# Patient Record
Sex: Male | Born: 1960 | Race: White | Hispanic: No | Marital: Married | Smoking: Former smoker
Health system: Southern US, Community
[De-identification: ages and names within clinical notes are randomized; demographics above are authoritative.]

## PROBLEM LIST (undated history)

## (undated) DIAGNOSIS — R519 Headache, unspecified: Secondary | ICD-10-CM

## (undated) DIAGNOSIS — R0600 Dyspnea, unspecified: Secondary | ICD-10-CM

## (undated) DIAGNOSIS — Z955 Presence of coronary angioplasty implant and graft: Secondary | ICD-10-CM

## (undated) DIAGNOSIS — R0602 Shortness of breath: Secondary | ICD-10-CM

## (undated) DIAGNOSIS — I700 Atherosclerosis of aorta: Secondary | ICD-10-CM

## (undated) DIAGNOSIS — K760 Fatty (change of) liver, not elsewhere classified: Secondary | ICD-10-CM

## (undated) DIAGNOSIS — R5383 Other fatigue: Secondary | ICD-10-CM

## (undated) DIAGNOSIS — R7989 Other specified abnormal findings of blood chemistry: Secondary | ICD-10-CM

## (undated) DIAGNOSIS — I10 Essential (primary) hypertension: Secondary | ICD-10-CM

## (undated) DIAGNOSIS — M1990 Unspecified osteoarthritis, unspecified site: Secondary | ICD-10-CM

## (undated) DIAGNOSIS — E785 Hyperlipidemia, unspecified: Secondary | ICD-10-CM

## (undated) DIAGNOSIS — T7840XA Allergy, unspecified, initial encounter: Secondary | ICD-10-CM

## (undated) DIAGNOSIS — M2550 Pain in unspecified joint: Secondary | ICD-10-CM

## (undated) DIAGNOSIS — J84112 Idiopathic pulmonary fibrosis: Secondary | ICD-10-CM

## (undated) DIAGNOSIS — G709 Myoneural disorder, unspecified: Secondary | ICD-10-CM

## (undated) DIAGNOSIS — K5900 Constipation, unspecified: Secondary | ICD-10-CM

## (undated) DIAGNOSIS — R1310 Dysphagia, unspecified: Secondary | ICD-10-CM

## (undated) DIAGNOSIS — K219 Gastro-esophageal reflux disease without esophagitis: Secondary | ICD-10-CM

## (undated) DIAGNOSIS — K0889 Other specified disorders of teeth and supporting structures: Secondary | ICD-10-CM

## (undated) DIAGNOSIS — D126 Benign neoplasm of colon, unspecified: Secondary | ICD-10-CM

## (undated) DIAGNOSIS — I2510 Atherosclerotic heart disease of native coronary artery without angina pectoris: Secondary | ICD-10-CM

## (undated) DIAGNOSIS — Z9289 Personal history of other medical treatment: Secondary | ICD-10-CM

## (undated) DIAGNOSIS — R0609 Other forms of dyspnea: Secondary | ICD-10-CM

## (undated) DIAGNOSIS — R51 Headache: Secondary | ICD-10-CM

## (undated) DIAGNOSIS — F32A Depression, unspecified: Secondary | ICD-10-CM

## (undated) DIAGNOSIS — J45909 Unspecified asthma, uncomplicated: Secondary | ICD-10-CM

## (undated) DIAGNOSIS — R21 Rash and other nonspecific skin eruption: Secondary | ICD-10-CM

## (undated) DIAGNOSIS — R778 Other specified abnormalities of plasma proteins: Secondary | ICD-10-CM

## (undated) HISTORY — DX: Personal history of other medical treatment: Z92.89

## (undated) HISTORY — DX: Atherosclerotic heart disease of native coronary artery without angina pectoris: I25.10

## (undated) HISTORY — DX: Other specified abnormalities of plasma proteins: R77.8

## (undated) HISTORY — DX: Hyperlipidemia, unspecified: E78.5

## (undated) HISTORY — DX: Other forms of dyspnea: R06.09

## (undated) HISTORY — DX: Fatty (change of) liver, not elsewhere classified: K76.0

## (undated) HISTORY — DX: Benign neoplasm of colon, unspecified: D12.6

## (undated) HISTORY — DX: Dyspnea, unspecified: R06.00

## (undated) HISTORY — DX: Shortness of breath: R06.02

## (undated) HISTORY — DX: Dysphagia, unspecified: R13.10

## (undated) HISTORY — DX: Depression, unspecified: F32.A

## (undated) HISTORY — DX: Other specified disorders of teeth and supporting structures: K08.89

## (undated) HISTORY — DX: Allergy, unspecified, initial encounter: T78.40XA

## (undated) HISTORY — PX: OTHER SURGICAL HISTORY: SHX169

## (undated) HISTORY — PX: COLONOSCOPY: SHX174

## (undated) HISTORY — DX: Pain in unspecified joint: M25.50

## (undated) HISTORY — DX: Other specified abnormal findings of blood chemistry: R79.89

## (undated) HISTORY — DX: Essential (primary) hypertension: I10

## (undated) HISTORY — DX: Rash and other nonspecific skin eruption: R21

## (undated) HISTORY — DX: Other fatigue: R53.83

## (undated) HISTORY — DX: Gastro-esophageal reflux disease without esophagitis: K21.9

## (undated) HISTORY — DX: Presence of coronary angioplasty implant and graft: Z95.5

## (undated) HISTORY — DX: Constipation, unspecified: K59.00

---

## 1997-09-14 ENCOUNTER — Encounter: Admission: RE | Admit: 1997-09-14 | Discharge: 1997-12-13 | Payer: Self-pay | Admitting: Family Medicine

## 2000-12-16 ENCOUNTER — Other Ambulatory Visit: Admission: RE | Admit: 2000-12-16 | Discharge: 2000-12-16 | Payer: Self-pay | Admitting: Gastroenterology

## 2012-03-27 ENCOUNTER — Other Ambulatory Visit (HOSPITAL_COMMUNITY): Payer: Self-pay | Admitting: Family Medicine

## 2012-03-27 DIAGNOSIS — R748 Abnormal levels of other serum enzymes: Secondary | ICD-10-CM

## 2012-04-08 HISTORY — PX: COLONOSCOPY W/ BIOPSIES AND POLYPECTOMY: SHX1376

## 2012-04-21 ENCOUNTER — Other Ambulatory Visit (HOSPITAL_COMMUNITY): Payer: Self-pay

## 2012-04-24 ENCOUNTER — Other Ambulatory Visit (HOSPITAL_COMMUNITY): Payer: Self-pay | Admitting: Family Medicine

## 2012-04-24 ENCOUNTER — Ambulatory Visit (HOSPITAL_COMMUNITY)
Admission: RE | Admit: 2012-04-24 | Discharge: 2012-04-24 | Disposition: A | Discharge status: Home / Self Care | Payer: 59 | Source: Ambulatory Visit | Attending: Family Medicine | Admitting: Family Medicine

## 2012-04-24 DIAGNOSIS — R748 Abnormal levels of other serum enzymes: Secondary | ICD-10-CM

## 2012-04-24 DIAGNOSIS — K8010 Calculus of gallbladder with chronic cholecystitis without obstruction: Secondary | ICD-10-CM | POA: Insufficient documentation

## 2012-04-24 DIAGNOSIS — R160 Hepatomegaly, not elsewhere classified: Secondary | ICD-10-CM | POA: Insufficient documentation

## 2012-08-28 ENCOUNTER — Encounter: Payer: Self-pay | Admitting: Gastroenterology

## 2012-09-03 ENCOUNTER — Telehealth: Payer: Self-pay

## 2012-09-03 NOTE — Telephone Encounter (Signed)
Pt was referred by Ferdie Ping, PA for a screening colonoscopy. LM at home for a return call.

## 2012-09-04 ENCOUNTER — Encounter: Payer: Self-pay | Admitting: Gastroenterology

## 2012-09-08 NOTE — Telephone Encounter (Signed)
LM for a return call.  

## 2012-09-22 NOTE — Telephone Encounter (Signed)
Letter to pt and PCP.  

## 2013-01-27 ENCOUNTER — Encounter: Payer: Self-pay | Admitting: Gastroenterology

## 2013-03-08 DIAGNOSIS — D126 Benign neoplasm of colon, unspecified: Secondary | ICD-10-CM

## 2013-03-08 HISTORY — DX: Benign neoplasm of colon, unspecified: D12.6

## 2013-03-18 ENCOUNTER — Ambulatory Visit (AMBULATORY_SURGERY_CENTER): Payer: 59 | Admitting: *Deleted

## 2013-03-18 VITALS — Ht 70.0 in | Wt 276.0 lb

## 2013-03-18 DIAGNOSIS — Z1211 Encounter for screening for malignant neoplasm of colon: Secondary | ICD-10-CM

## 2013-03-18 MED ORDER — MOVIPREP 100 G PO SOLR: ORAL | Status: DC

## 2013-03-18 NOTE — Progress Notes (Signed)
No allergies to eggs or soy. No prior anesthesia.  

## 2013-03-23 ENCOUNTER — Encounter: Payer: Self-pay | Admitting: Gastroenterology

## 2013-03-24 ENCOUNTER — Encounter: Payer: Self-pay | Admitting: Gastroenterology

## 2013-03-30 ENCOUNTER — Encounter: Payer: Self-pay | Admitting: Gastroenterology

## 2013-03-30 ENCOUNTER — Ambulatory Visit (AMBULATORY_SURGERY_CENTER): Payer: 59 | Admitting: Gastroenterology

## 2013-03-30 VITALS — BP 144/91 | HR 68 | Temp 97.6°F | Resp 15 | Ht 70.0 in | Wt 276.0 lb

## 2013-03-30 DIAGNOSIS — D126 Benign neoplasm of colon, unspecified: Secondary | ICD-10-CM

## 2013-03-30 DIAGNOSIS — Z1211 Encounter for screening for malignant neoplasm of colon: Secondary | ICD-10-CM

## 2013-03-30 MED ORDER — SODIUM CHLORIDE 0.9 % IV SOLN: 500.0000 mL | INTRAVENOUS | Status: DC

## 2013-03-30 NOTE — Progress Notes (Signed)
Called to room to assist during endoscopic procedure.  Patient ID and intended procedure confirmed with present staff. Received instructions for my participation in the procedure from the performing physician.  

## 2013-03-30 NOTE — Progress Notes (Signed)
Lidocaine-40mg IV prior to Propofol InductionPropofol given over incremental dosages 

## 2013-03-30 NOTE — Patient Instructions (Signed)
YOU HAD AN ENDOSCOPIC PROCEDURE TODAY AT THE [facility]: Refer to the procedure report that was given to you for any specific questions about what was found during the examination.  If the procedure report does not answer your questions, please call your gastroenterologist to clarify.  If you requested that your care partner not be given the details of your procedure findings, then the procedure report has been included in a sealed envelope for you to review at your convenience later.  YOU SHOULD EXPECT: Some feelings of bloating in the abdomen. Passage of more gas than usual.  Walking can help get rid of the air that was put into your GI tract during the procedure and reduce the bloating. If you had a lower endoscopy (such as a colonoscopy or flexible sigmoidoscopy) you may notice spotting of blood in your stool or on the toilet paper. If you underwent a bowel prep for your procedure, then you may not have a normal bowel movement for a few days.  DIET: Your first meal following the procedure should be a light meal and then it is ok to progress to your normal diet.  A half-sandwich or bowl of soup is an example of a good first meal.  Heavy or fried foods are harder to digest and may make you feel nauseous or bloated.  Likewise meals heavy in dairy and vegetables can cause extra gas to form and this can also increase the bloating.  Drink plenty of fluids but you should avoid alcoholic beverages for 24 hours.  ACTIVITY: Your care partner should take you home directly after the procedure.  You should plan to take it easy, moving slowly for the rest of the day.  You can resume normal activity the day after the procedure however you should NOT DRIVE or use heavy machinery for 24 hours (because of the sedation medicines used during the test).    SYMPTOMS TO REPORT IMMEDIATELY: A gastroenterologist can be reached at any hour.  During normal business hours, 8:30 AM to 5:00 PM Monday through Friday,  call [phone].  After hours and on weekends, please call the GI answering service at [phone] who will take a message and have the physician on call contact you.   Following lower endoscopy (colonoscopy or flexible sigmoidoscopy):  Excessive amounts of blood in the stool  Significant tenderness or worsening of abdominal pains  Swelling of the abdomen that is new, acute  Fever of 100F or higher    FOLLOW UP: If any biopsies were taken you will be contacted by phone or by letter within the next 1-3 weeks.  Call your gastroenterologist if you have not heard about the biopsies in 3 weeks.  Our staff will call the home number listed on your records the next business day following your procedure to check on you and address any questions or concerns that you may have at that time regarding the information given to you following your procedure. This is a courtesy call and so if there is no answer at the home number and we have not heard from you through the emergency physician on call, we will assume that you have returned to your regular daily activities without incident.  SIGNATURES/CONFIDENTIALITY: You and/or your care partner have signed paperwork which will be entered into your electronic medical record.  These signatures attest to the fact that that the information above on your After Visit Summary has been reviewed and is understood.  Full responsibility of the confidentiality   of this discharge information lies with you and/or your care-partner.   Polyp, diverticulosis, and high fiber information given.  Dr. Russella Dar will let you know about timing for next colonoscopy after he reviews pathology reports.

## 2013-03-30 NOTE — Op Note (Signed)
[facility] [address] [place], [address]   COLONOSCOPY PROCEDURE REPORT PATIENT: Colin Lee, Colin Lee  MR#: [id number] BIRTHDATE: 1961-03-01 , 52  yrs. old GENDER: Male ENDOSCOPIST: Meryl Dare, MD, [facility] REFERRED QI:ONGEXBM Robertsobn, PA-C PROCEDURE DATE:  03/30/2013 PROCEDURE:   Colonoscopy with biopsy and snare polypectomy First Screening Colonoscopy - Avg.  risk and is 50 yrs.  old or older Yes.  Prior Negative Screening - Now for repeat screening. N/A  History of Adenoma - Now for follow-up colonoscopy & has been > or = to 3 yrs.  N/A  Polyps Removed Today? Yes. ASA CLASS:   Class II INDICATIONS:average risk screening. MEDICATIONS: MAC sedation, administered by CRNA and propofol (Diprivan) 250mg  IV DESCRIPTION OF PROCEDURE:   After the risks benefits and alternatives of the procedure were thoroughly explained, informed consent was obtained.  A digital rectal exam revealed no abnormalities of the rectum.   The LB [id number]  endoscope was introduced through the anus and advanced to the cecum, which was identified by both the appendix and ileocecal valve. No adverse events experienced.   The quality of the prep was good, using MoviPrep  The instrument was then slowly withdrawn as the colon was fully examined.  COLON FINDINGS: A sessile polyp measuring 6 mm in size was found in the transverse colon.  A polypectomy was performed with a cold snare.  The resection was complete and the polyp tissue was completely retrieved.   Two sessile polyps measuring 6-7 mm in size were found in the sigmoid colon.  A polypectomy was performed with a cold snare.  The resection was complete and the polyp tissue was completely retrieved.   Two sessile polyps measuring 3-4 mm in size were found in the sigmoid colon.  A polypectomy was performed with cold forceps.  The resection was complete and the polyp tissue was completely retrieved.   Mild diverticulosis was  noted in the transverse colon.   The colon was otherwise normal.  There was no diverticulosis, inflammation, polyps or cancers unless previously stated.  Retroflexed views revealed no abnormalities. The time to cecum=1 minutes 15 seconds.  Withdrawal time=12 minutes 33 seconds. The scope was withdrawn and the procedure completed. COMPLICATIONS: There were no complications. ENDOSCOPIC IMPRESSION: 1.   Sessile polyp measuring 6 mm in the transverse colon; polypectomy performed with a cold snare 2.   Two sessile polyps measuring 6-7 mm in the sigmoid colon; polypectomy performed with a cold snare 3.   Two sessile polyps measuring 3-4 mm in the sigmoid colon; polypectomy performed with cold forceps 4.   Mild diverticulosis was noted in the transverse colon  RECOMMENDATIONS: 1.  Await pathology results 2.  Repeat colonoscopy in 3 years if 3 or more polyps adenomatous; 5 years if 1-2 adenomatous: otherwise 10 years  eSigned:  Meryl Dare, MD, [facility] 03/30/2013 9:27 AM

## 2013-03-31 ENCOUNTER — Telehealth: Payer: Self-pay | Admitting: *Deleted

## 2013-03-31 NOTE — Telephone Encounter (Signed)
  Follow up Call-  Call back number 03/30/2013  Post procedure Call Back phone  # [phone]  Permission to leave phone message Yes     Patient questions:  Do you have a fever, pain , or abdominal swelling? no Pain Score  0 *  Have you tolerated food without any problems? yes  Have you been able to return to your normal activities? yes  Do you have any questions about your discharge instructions: Diet   no Medications  no Follow up visit  no  Do you have questions or concerns about your Care? no  Actions: * If pain score is 4 or above: No action needed, pain <4.

## 2013-04-08 ENCOUNTER — Encounter: Payer: Self-pay | Admitting: Gastroenterology

## 2013-04-08 HISTORY — PX: THORACENTESIS: SHX235

## 2013-10-06 ENCOUNTER — Emergency Department (HOSPITAL_COMMUNITY): Payer: BC Managed Care – PPO

## 2013-10-06 ENCOUNTER — Inpatient Hospital Stay (HOSPITAL_COMMUNITY): Payer: BC Managed Care – PPO

## 2013-10-06 ENCOUNTER — Inpatient Hospital Stay (HOSPITAL_COMMUNITY)
Admission: EM | Admit: 2013-10-06 | Discharge: 2013-10-11 | DRG: 186 | Disposition: A | Discharge status: Home / Self Care | Payer: BC Managed Care – PPO | Attending: Internal Medicine | Admitting: Internal Medicine

## 2013-10-06 ENCOUNTER — Encounter (HOSPITAL_COMMUNITY): Payer: Self-pay | Admitting: Emergency Medicine

## 2013-10-06 DIAGNOSIS — K219 Gastro-esophageal reflux disease without esophagitis: Secondary | ICD-10-CM | POA: Diagnosis present

## 2013-10-06 DIAGNOSIS — J90 Pleural effusion, not elsewhere classified: Principal | ICD-10-CM | POA: Diagnosis present

## 2013-10-06 DIAGNOSIS — G2581 Restless legs syndrome: Secondary | ICD-10-CM

## 2013-10-06 DIAGNOSIS — Z23 Encounter for immunization: Secondary | ICD-10-CM

## 2013-10-06 DIAGNOSIS — J189 Pneumonia, unspecified organism: Secondary | ICD-10-CM

## 2013-10-06 DIAGNOSIS — M129 Arthropathy, unspecified: Secondary | ICD-10-CM | POA: Diagnosis present

## 2013-10-06 DIAGNOSIS — D721 Eosinophilia, unspecified: Secondary | ICD-10-CM | POA: Diagnosis present

## 2013-10-06 DIAGNOSIS — R0902 Hypoxemia: Secondary | ICD-10-CM | POA: Diagnosis present

## 2013-10-06 DIAGNOSIS — Z87891 Personal history of nicotine dependence: Secondary | ICD-10-CM

## 2013-10-06 DIAGNOSIS — I10 Essential (primary) hypertension: Secondary | ICD-10-CM | POA: Diagnosis present

## 2013-10-06 HISTORY — DX: Shortness of breath: R06.02

## 2013-10-06 LAB — CBC
HCT: 41.1 % (ref 39.0–52.0)
Hemoglobin: 13.9 g/dL (ref 13.0–17.0)
MCH: 28.3 pg (ref 26.0–34.0)
MCHC: 33.8 g/dL (ref 30.0–36.0)
MCV: 83.7 fL (ref 78.0–100.0)
Platelets: 281 10*3/uL (ref 150–400)
RBC: 4.91 MIL/uL (ref 4.22–5.81)
RDW: 13.2 % (ref 11.5–15.5)
WBC: 9.8 10*3/uL (ref 4.0–10.5)

## 2013-10-06 LAB — CREATININE, SERUM
Creatinine, Ser: 0.88 mg/dL (ref 0.50–1.35)
GFR calc Af Amer: >90 mL/min (ref >90)
GFR calc non Af Amer: >90 mL/min (ref >90)

## 2013-10-06 LAB — CBC WITH DIFFERENTIAL/PLATELET
Basophils Absolute: 0 10*3/uL (ref 0.0–0.1)
Basophils Relative: 0 % (ref 0–1)
Eosinophils Absolute: 0.7 10*3/uL (ref 0.0–0.7)
Eosinophils Relative: 7 % — ABNORMAL HIGH (ref 0–5)
HCT: 40.8 % (ref 39.0–52.0)
Hemoglobin: 14 g/dL (ref 13.0–17.0)
Lymphocytes Relative: 24 % (ref 12–46)
Lymphs Abs: 2.3 10*3/uL (ref 0.7–4.0)
MCH: 28.6 pg (ref 26.0–34.0)
MCHC: 34.3 g/dL (ref 30.0–36.0)
MCV: 83.3 fL (ref 78.0–100.0)
Monocytes Absolute: 0.6 10*3/uL (ref 0.1–1.0)
Monocytes Relative: 7 % (ref 3–12)
Neutro Abs: 6 10*3/uL (ref 1.7–7.7)
Neutrophils Relative %: 62 % (ref 43–77)
Platelets: 278 10*3/uL (ref 150–400)
RBC: 4.9 MIL/uL (ref 4.22–5.81)
RDW: 13.2 % (ref 11.5–15.5)
WBC: 9.6 10*3/uL (ref 4.0–10.5)

## 2013-10-06 LAB — COMPREHENSIVE METABOLIC PANEL
ALT: 53 U/L (ref 0–53)
AST: 36 U/L (ref 0–37)
Albumin: 3.7 g/dL (ref 3.5–5.2)
Alkaline Phosphatase: 82 U/L (ref 39–117)
Anion gap: 13 (ref 5–15)
BUN: 16 mg/dL (ref 6–23)
CO2: 28 mEq/L (ref 19–32)
Calcium: 9.3 mg/dL (ref 8.4–10.5)
Chloride: 101 mEq/L (ref 96–112)
Creatinine, Ser: 0.98 mg/dL (ref 0.50–1.35)
GFR calc Af Amer: >90 mL/min (ref >90)
GFR calc non Af Amer: >90 mL/min (ref >90)
Glucose, Bld: 103 mg/dL — ABNORMAL HIGH (ref 70–99)
Potassium: 4.3 mEq/L (ref 3.7–5.3)
Sodium: 142 mEq/L (ref 137–147)
Total Bilirubin: 0.4 mg/dL (ref 0.3–1.2)
Total Protein: 7.2 g/dL (ref 6.0–8.3)

## 2013-10-06 LAB — PRO B NATRIURETIC PEPTIDE: Pro B Natriuretic peptide (BNP): 79.8 pg/mL (ref 0–125)

## 2013-10-06 LAB — TROPONIN I: Troponin I: <0.3 ng/mL (ref <0.30)

## 2013-10-06 MED ORDER — PANTOPRAZOLE SODIUM 40 MG PO TBEC
40.0000 mg | DELAYED_RELEASE_TABLET | Freq: Every day | ORAL | Status: DC
  Administered 2013-10-07 – 2013-10-11 (×5): 40 mg via ORAL
  Filled 2013-10-06 (×5): qty 1

## 2013-10-06 MED ORDER — LOSARTAN POTASSIUM 50 MG PO TABS
50.0000 mg | ORAL_TABLET | Freq: Every day | ORAL | Status: DC
  Administered 2013-10-07 – 2013-10-11 (×5): 50 mg via ORAL
  Filled 2013-10-06 (×5): qty 1

## 2013-10-06 MED ORDER — ROPINIROLE HCL 0.25 MG PO TABS
0.2500 mg | ORAL_TABLET | Freq: Every day | ORAL | Status: DC
  Administered 2013-10-06 – 2013-10-10 (×5): 0.25 mg via ORAL
  Filled 2013-10-06 (×7): qty 1

## 2013-10-06 MED ORDER — ACETAMINOPHEN 325 MG PO TABS
650.0000 mg | ORAL_TABLET | Freq: Four times a day (QID) | ORAL | Status: DC | PRN
  Administered 2013-10-07 – 2013-10-09 (×6): 650 mg via ORAL
  Filled 2013-10-06 (×6): qty 2

## 2013-10-06 MED ORDER — ONDANSETRON HCL 4 MG/2ML IJ SOLN: 4.0000 mg | Freq: Four times a day (QID) | INTRAMUSCULAR | Status: DC | PRN

## 2013-10-06 MED ORDER — SODIUM CHLORIDE 0.9 % IV SOLN
INTRAVENOUS | Status: DC
  Administered 2013-10-06: 23:00:00 via INTRAVENOUS

## 2013-10-06 MED ORDER — FOLIC ACID 1 MG PO TABS
1.0000 mg | ORAL_TABLET | Freq: Every day | ORAL | Status: DC
  Administered 2013-10-07 – 2013-10-11 (×5): 1 mg via ORAL
  Filled 2013-10-06 (×5): qty 1

## 2013-10-06 MED ORDER — OXYCODONE HCL 5 MG PO TABS
5.0000 mg | ORAL_TABLET | ORAL | Status: DC | PRN
  Administered 2013-10-08 (×2): 5 mg via ORAL
  Filled 2013-10-06 (×2): qty 1

## 2013-10-06 MED ORDER — ONDANSETRON HCL 4 MG PO TABS: 4.0000 mg | ORAL_TABLET | Freq: Four times a day (QID) | ORAL | Status: DC | PRN

## 2013-10-06 MED ORDER — PNEUMOCOCCAL VAC POLYVALENT 25 MCG/0.5ML IJ INJ
0.5000 mL | INJECTION | INTRAMUSCULAR | Status: AC
  Administered 2013-10-07: 0.5 mL via INTRAMUSCULAR
  Filled 2013-10-06: qty 0.5

## 2013-10-06 MED ORDER — ADULT MULTIVITAMIN W/MINERALS CH
1.0000 | ORAL_TABLET | Freq: Every day | ORAL | Status: DC
  Administered 2013-10-07 – 2013-10-11 (×5): 1 via ORAL
  Filled 2013-10-06 (×5): qty 1

## 2013-10-06 MED ORDER — VITAMIN B-1 100 MG PO TABS
100.0000 mg | ORAL_TABLET | Freq: Every day | ORAL | Status: DC
  Administered 2013-10-07 – 2013-10-11 (×5): 100 mg via ORAL
  Filled 2013-10-06 (×5): qty 1

## 2013-10-06 MED ORDER — CEFTRIAXONE SODIUM 1 G IJ SOLR
INTRAMUSCULAR | Status: AC
  Filled 2013-10-06: qty 10

## 2013-10-06 MED ORDER — HEPARIN SODIUM (PORCINE) 5000 UNIT/ML IJ SOLN
5000.0000 [IU] | Freq: Three times a day (TID) | INTRAMUSCULAR | Status: DC
  Administered 2013-10-06 – 2013-10-11 (×14): 5000 [IU] via SUBCUTANEOUS
  Filled 2013-10-06 (×14): qty 1

## 2013-10-06 MED ORDER — CEFTRIAXONE SODIUM 1 G IJ SOLR
1.0000 g | INTRAMUSCULAR | Status: DC
  Administered 2013-10-06 – 2013-10-10 (×5): 1 g via INTRAVENOUS
  Filled 2013-10-06 (×6): qty 10

## 2013-10-06 MED ORDER — IOHEXOL 350 MG/ML SOLN
100.0000 mL | Freq: Once | INTRAVENOUS | Status: AC | PRN
  Administered 2013-10-06: 100 mL via INTRAVENOUS

## 2013-10-06 MED ORDER — ZOLPIDEM TARTRATE 5 MG PO TABS: 5.0000 mg | ORAL_TABLET | Freq: Every evening | ORAL | Status: DC | PRN

## 2013-10-06 MED ORDER — ALUM & MAG HYDROXIDE-SIMETH 200-200-20 MG/5ML PO SUSP: 30.0000 mL | Freq: Four times a day (QID) | ORAL | Status: DC | PRN

## 2013-10-06 MED ORDER — ASPIRIN 81 MG PO CHEW
324.0000 mg | CHEWABLE_TABLET | Freq: Once | ORAL | Status: AC
  Administered 2013-10-06: 324 mg via ORAL
  Filled 2013-10-06: qty 4

## 2013-10-06 MED ORDER — ACETAMINOPHEN 650 MG RE SUPP: 650.0000 mg | Freq: Four times a day (QID) | RECTAL | Status: DC | PRN

## 2013-10-06 NOTE — ED Notes (Signed)
Pt ambulated once around nursing station. HR began at 100bpm and increased to 114bpm while walking. Pulse Ox began at 96% and pt desat to 88-89% on room air. Hospitalist in room with pt and made aware.

## 2013-10-06 NOTE — ED Provider Notes (Addendum)
This chart was scribed for [name], DO by Lowella Petties, ED Scribe. The patient was seen in room APA12/APA12. Patient's care was started at 7:07 PM.  CHIEF COMPLAINT: Pneumonia  HPI Comments: Colin Lee is a 53 y.o. male with a history of HTN who presents to the Emergency Department complaining of SOB and chest tightness intermittently since May. He states his pain is worse with exertion.Marland Kitchen He states that last month he was given a prednisone and azithromycin with relief, but his symptoms will quickly return. He states that his pain is exacerbated by walking and exertion.   He reports an associated dry productive cough and chest pressure without radiation. He reports fever and chills last month, last fever June 20. He denies DM or high cholesterol. He denies history of DVT or PE, cancer, recent prolonged immobilization or fracture or surgery or trauma. He denies family history of heart problems. He was a smoker, but quit in 2002.  He was seen at [facility] today and had a chest x-ray which showed a small-to-moderate pleural effusion and he was sent to the emergency department for further evaluation.  ROS: See HPI Constitutional: no fever  Eyes: no drainage  ENT: no runny nose   Cardiovascular:  chest pain, chest tightness Resp: SOB  GI: no vomiting GU: no dysuria Integumentary: no rash  Allergy: no hives  Musculoskeletal: no leg swelling  Neurological: no slurred speech ROS otherwise negative  PAST MEDICAL HISTORY/PAST SURGICAL HISTORY:  Past Medical History  Diagnosis Date  . GERD (gastroesophageal reflux disease)   . Hypertension     MEDICATIONS:  Prior to Admission medications   Medication Sig Start Date End Date Taking? Authorizing Provider  aspirin 81 MG tablet Take 81 mg by mouth daily.    Historical Provider, MD  ibuprofen (ADVIL,MOTRIN) 600 MG tablet Take 600 mg by mouth every 6 (six) hours as needed.    Historical Provider, MD  losartan (COZAAR) 50  MG tablet Take 50 mg by mouth daily.    Historical Provider, MD  omeprazole (PRILOSEC) 20 MG capsule Take 20 mg by mouth daily.    Historical Provider, MD    ALLERGIES:  No Known Allergies  SOCIAL HISTORY:  History  Substance Use Topics  . Smoking status: Former Smoker -- 1.00 packs/day for 30 years    Types: Cigarettes    Quit date: 04/08/2000  . Smokeless tobacco: Never Used  . Alcohol Use: Yes     Comment: rare    FAMILY HISTORY: Family History  Problem Relation Age of Onset  . Adopted: Yes  . Family history unknown: Yes    EXAM: Triage Vitals: BP 151/93  Pulse 99  Temp(Src) 97.9 F (36.6 C) (Oral)  Resp 20  Ht 5\' 10"  (1.778 m)  Wt 280 lb (127.007 kg)  BMI 40.18 kg/m2  SpO2 95% CONSTITUTIONAL: Alert and oriented and responds appropriately to questions. Well-appearing; well-nourished in no apparent distress HEAD: Normocephalic EYES: Conjunctivae clear, PERRL ENT: normal nose; no rhinorrhea; moist mucous membranes; pharynx without lesions noted NECK: Supple, no meningismus, no LAD  CARD: RRR; S1 and S2 appreciated; no murmurs, no clicks, no rubs, no gallops RESP: Normal chest excursion without splinting or tachypnea; patient has diminished breath sounds to the mid lung on the left side, no wheezing or rhonchi, no respiratory distress or hypoxia, no increased work of breathing ABD/GI: Normal bowel sounds; non-distended; soft, non-tender, no rebound, no guarding BACK:  The back appears normal and is non-tender  to palpation, there is no CVA tenderness EXT: Normal ROM in all joints; non-tender to palpation; no edema; normal capillary refill; no cyanosis    SKIN: Normal color for age and race; warm NEURO: Moves all extremities equally PSYCH: The patient's mood and manner are appropriate. Grooming and personal hygiene are appropriate.  MEDICAL DECISION MAKING: Patient here with small to moderate left-sided pleural effusion seen on chest x-ray. He describes shortness of  breath is worse with exertion as well as chest pressure. No chest pressure currently. We'll obtain cardiac labs, BMP and repeat chest x-ray. He has no hypoxia, respiratory distress or increased work of breathing currently.  ED PROGRESS: Pt's cardiac labs are unremarkable. Chest x-ray shows a moderate pleural effusion. Given he becomes symptomatic with ambulation, will admit to hospitalist for possible thoracentesis. Discussed with Dr. Humphrey Rolls. Patient desats to 88% with ambulation.    EKG Interpretation  Date/Time:  Wednesday October 06 2013 18:42:18 EDT Ventricular Rate:  97 PR Interval:  138 QRS Duration: 97 QT Interval:  353 QTC Calculation: 448 R Axis:   37 Text Interpretation:  Sinus rhythm Confirmed by WARD,  DO, KRISTEN ([id number]) on 10/06/2013 7:02:52 PM        I personally performed the services described in this documentation, which was scribed in my presence. The recorded information has been reviewed and is accurate.     [place], DO 10/06/13 2057  Irene, DO 10/06/13 2100

## 2013-10-06 NOTE — H&P (Signed)
[facility] History and Physical  Colin Lee Colin Lee DOB: Apr 01, 1961 DOA: 10/06/2013  Referring physician: Pryor Curia, DO PCP: Bronson Curb, PA-C   Chief Complaint: Chest pain  HPI: Colin Lee is a 53 y.o. male who has been having pain in his chest since about May. He states he has had some cough was seen in the PCP office and was given a zpack. Patient states that he had also had some steroids. Patient states that he has noted some shortness of breath. He states that the pain is related to his breathing. It does not appear to be radiating anywhere. Patient states that he has no nausea and no vomiting, denies any fevers presently. 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 Patient in the ED was noted to drop his saturation to 89% with ambulation. In addition he states that he does have some chronic arthritis and takes Ibuprofen for this as well as RLS. On evaluation of the CXR he does show a pleural effusion on the left side.   Review of Systems:  Constitutional:  No weight loss, night sweats, Fevers, chills, fatigue.  HEENT:  No headaches Cardio-vascular:  ++chest pain, no Orthopnea, PND, swelling in lower extremities, anasarca  GI:  No heartburn, indigestion, abdominal pain, nausea, vomiting, diarrhea  Resp:  ++shortness of breath with exertion. No coughing up of blood Skin:  no rash or lesions.  GU:  no dysuria, change in color of urine  Musculoskeletal:  ++joint pain or swelling. No decreased range of motion Psych:  No change in mood or affect. No depression or anxiety. No memory loss.   Past Medical History  Diagnosis Date  . GERD (gastroesophageal reflux disease)   . Hypertension    Past Surgical History  Procedure Laterality Date  . No prior surgery     Social History:  reports that he quit smoking about 13 years ago. His smoking use included Cigarettes. He has a 30  pack-year smoking history. He has never used smokeless tobacco. He reports that he drinks alcohol. He reports that he does not use illicit drugs.  No Known Allergies  Family History  Problem Relation Age of Onset  . Adopted: Yes  . Family history unknown: Yes     Prior to Admission medications   Medication Sig Start Date End Date Taking? Authorizing Provider  ibuprofen (ADVIL,MOTRIN) 600 MG tablet Take 600 mg by mouth every 6 (six) hours as needed. pain   Yes Historical Provider, MD  losartan (COZAAR) 50 MG tablet Take 50 mg by mouth daily.   Yes Historical Provider, MD  omeprazole (PRILOSEC) 20 MG capsule Take 20 mg by mouth daily.   Yes Historical Provider, MD   Physical Exam: Filed Vitals:   10/06/13 1832  BP: 151/93  Pulse: 99  Temp: 97.9 F (36.6 C)  Resp: 20    BP 151/93  Pulse 99  Temp(Src) 97.9 F (36.6 C) (Oral)  Resp 20  Ht '5\' 10"'  (1.778 m)  Wt 127.007 kg (280 lb)  BMI 40.18 kg/m2  SpO2 95%  General:  Appears calm and comfortable Eyes: PERRL, normal lids, irises & conjunctiva ENT: grossly normal hearing, lips & tongue Neck: no LAD, masses or thyromegaly Cardiovascular: RRR, no m/r/g. No LE edema Respiratory: CTA bilaterally, no w/r/r. Normal respiratory effort. Skin: no rash or induration seen on limited exam Musculoskeletal: grossly normal tone BUE/BLE Psychiatric: grossly normal mood and affect, speech  fluent and appropriate Neurologic: grossly non-focal.          Labs on Admission:  Basic Metabolic Panel:  Recent Labs Lab 10/06/13 1917  NA 142  K 4.3  CL 101  CO2 28  GLUCOSE 103*  BUN 16  CREATININE 0.98  CALCIUM 9.3   Liver Function Tests:  Recent Labs Lab 10/06/13 1917  AST 36  ALT 53  ALKPHOS 82  BILITOT 0.4  PROT 7.2  ALBUMIN 3.7   No results found for this basename: LIPASE, AMYLASE,  in the last 168 hours No results found for this basename: AMMONIA,  in the last 168 hours CBC:  Recent Labs Lab 10/06/13 1917  WBC 9.6    NEUTROABS 6.0  HGB 14.0  HCT 40.8  MCV 83.3  PLT 278   Cardiac Enzymes:  Recent Labs Lab 10/06/13 1942  TROPONINI <0.30    BNP (last 3 results)  Recent Labs  10/06/13 1942  PROBNP 79.8   CBG: No results found for this basename: GLUCAP,  in the last 168 hours  Radiological Exams on Admission: Dg Chest 2 View  10/06/2013   CLINICAL DATA:  Chest pain/soreness  EXAM: CHEST  2 VIEW  COMPARISON:  None.  FINDINGS: Layering small to moderate left pleural effusion. Associated left lower lobe opacity, likely atelectasis. No focal consolidation. No pneumothorax.  The heart is top-normal in size.  Mild degenerative changes of the visualized thoracolumbar spine.  IMPRESSION: Layering small to moderate left pleural effusion.   Electronically Signed   By: Julian Hy M.D.   On: 10/06/2013 20:35     Assessment/Plan Principal Problem:   Pleural effusion Active Problems:   Hypertension   1. Pleural Effusion -appears to be chronic. In addition he has had possible pneumonia -also has history of arthritis and therefore would get ANA ESR RF and also ANCA -will schedule for thoracentesis but he has been on ibuprofen and received Aspirin in the ED -will get a CT of the chest to make sure there are no loculations  2. Hypertension -will continue with home medications  3. Restless Legs -will start on requip  4. GERD -continue with PPI   Code Status: Full Code (must indicate code status--if unknown or must be presumed, indicate so) Family Communication: Wife (indicate person spoken with, if applicable, with phone number if by telephone) Disposition Plan: Home (indicate anticipated LOS)  Time spent: 70mn  Okie Jansson A [facility] Pager [phone] **Disclaimer: This note may have been dictated with voice recognition software. Similar sounding words can inadvertently be transcribed and this note may contain transcription errors which may not have been corrected upon  publication of note.**

## 2013-10-06 NOTE — ED Notes (Signed)
Patient sent here by [facility]. Patient has had shortness of breath since May. Patient was given antibiotics and prednisone in which he reported getting better but now states shortness of breath has returned and he went back to [facility]. Patient had chest x-ray which showed moderate left pleural effusion and underlying mild pulmonary edema.

## 2013-10-07 ENCOUNTER — Inpatient Hospital Stay (HOSPITAL_COMMUNITY): Payer: BC Managed Care – PPO

## 2013-10-07 ENCOUNTER — Encounter (HOSPITAL_COMMUNITY): Payer: Self-pay | Admitting: *Deleted

## 2013-10-07 DIAGNOSIS — G2581 Restless legs syndrome: Secondary | ICD-10-CM

## 2013-10-07 LAB — CBC
HCT: 40.8 % (ref 39.0–52.0)
Hemoglobin: 13.9 g/dL (ref 13.0–17.0)
MCH: 28.5 pg (ref 26.0–34.0)
MCHC: 34.1 g/dL (ref 30.0–36.0)
MCV: 83.8 fL (ref 78.0–100.0)
Platelets: 247 10*3/uL (ref 150–400)
RBC: 4.87 MIL/uL (ref 4.22–5.81)
RDW: 13.1 % (ref 11.5–15.5)
WBC: 8.2 10*3/uL (ref 4.0–10.5)

## 2013-10-07 LAB — COMPREHENSIVE METABOLIC PANEL
ALT: 54 U/L — ABNORMAL HIGH (ref 0–53)
AST: 40 U/L — ABNORMAL HIGH (ref 0–37)
Albumin: 3.5 g/dL (ref 3.5–5.2)
Alkaline Phosphatase: 78 U/L (ref 39–117)
Anion gap: 8 (ref 5–15)
BUN: 15 mg/dL (ref 6–23)
CO2: 30 mEq/L (ref 19–32)
Calcium: 9 mg/dL (ref 8.4–10.5)
Chloride: 102 mEq/L (ref 96–112)
Creatinine, Ser: 0.96 mg/dL (ref 0.50–1.35)
GFR calc Af Amer: >90 mL/min (ref >90)
GFR calc non Af Amer: >90 mL/min (ref >90)
Glucose, Bld: 104 mg/dL — ABNORMAL HIGH (ref 70–99)
Potassium: 4.4 mEq/L (ref 3.7–5.3)
Sodium: 140 mEq/L (ref 137–147)
Total Bilirubin: 0.5 mg/dL (ref 0.3–1.2)
Total Protein: 6.9 g/dL (ref 6.0–8.3)

## 2013-10-07 LAB — BODY FLUID CELL COUNT WITH DIFFERENTIAL
Eos, Fluid: 61 %
Lymphs, Fluid: 6 %
Monocyte-Macrophage-Serous Fluid: 18 % — ABNORMAL LOW (ref 50–90)
Neutrophil Count, Fluid: 9 % (ref 0–25)
Other Cells, Fluid: 6 %
Total Nucleated Cell Count, Fluid: 1611 cu mm — ABNORMAL HIGH (ref 0–1000)

## 2013-10-07 LAB — IRON AND TIBC
Iron: 55 ug/dL (ref 42–135)
Saturation Ratios: 18 % — ABNORMAL LOW (ref 20–55)
TIBC: 312 ug/dL (ref 215–435)
UIBC: 257 ug/dL (ref 125–400)

## 2013-10-07 LAB — TSH: TSH: 2.1 u[IU]/mL (ref 0.350–4.500)

## 2013-10-07 LAB — LACTATE DEHYDROGENASE, PLEURAL OR PERITONEAL FLUID: LD, Fluid: 500 U/L — ABNORMAL HIGH (ref 3–23)

## 2013-10-07 LAB — HEMOGLOBIN A1C
Hgb A1c MFr Bld: 6.1 % — ABNORMAL HIGH (ref <5.7)
Mean Plasma Glucose: 128 mg/dL — ABNORMAL HIGH (ref <117)

## 2013-10-07 LAB — GLUCOSE, CAPILLARY: Glucose-Capillary: 112 mg/dL — ABNORMAL HIGH (ref 70–99)

## 2013-10-07 LAB — SEDIMENTATION RATE: Sed Rate: 14 mm/hr (ref 0–16)

## 2013-10-07 LAB — LACTATE DEHYDROGENASE: LDH: 203 U/L (ref 94–250)

## 2013-10-07 LAB — RHEUMATOID FACTOR: Rhuematoid fact SerPl-aCnc: <10 IU/mL (ref <=14)

## 2013-10-07 LAB — ALBUMIN, FLUID (OTHER): Albumin, Fluid: 3 g/dL

## 2013-10-07 MED ORDER — DEXTROSE 5 % IV SOLN
INTRAVENOUS | Status: AC
  Filled 2013-10-07: qty 500

## 2013-10-07 MED ORDER — DEXTROSE 5 % IV SOLN
500.0000 mg | INTRAVENOUS | Status: DC
  Administered 2013-10-07 – 2013-10-10 (×4): 500 mg via INTRAVENOUS
  Filled 2013-10-07 (×5): qty 500

## 2013-10-07 NOTE — Procedures (Signed)
PreOperative Dx: LEFT pleural effusion Postoperative Dx: LEFT pleural effusion Procedure:   US guided LEFT thoracentesis Radiologist:  Thornton Papas Anesthesia:  10 ml of 1% lidocaine Specimen:  1040 ml of yellow colored fluid EBL:   < 1 ml Complications: None

## 2013-10-07 NOTE — Care Management Utilization Note (Signed)
UR completed 

## 2013-10-07 NOTE — Care Management Note (Signed)
    Page 1 of 1   10/11/2013     4:42:45 PM CARE MANAGEMENT NOTE 10/11/2013  Patient:  Colin Lee, Colin Lee   Account Number:  [id number]  Date Initiated:  10/07/2013  Documentation initiated by:  Vladimir Creeks  Subjective/Objective Assessment:   Admitted with a plural effusion, an recent PNA. He is from home with spouse, is independent, and will return home at D/C     Action/Plan:   No needs identified   Anticipated DC Date:  10/11/2013   Anticipated DC Plan:  [place]  CM consult      Choice offered to / List presented to:             Status of service:  Completed, signed off Medicare Important Message given?   (If response is "NO", the following Medicare IM given date fields will be blank) Date Medicare IM given:   Medicare IM given by:   Date Additional Medicare IM given:   Additional Medicare IM given by:    Discharge Disposition:  HOME/SELF CARE  Per UR Regulation:  Reviewed for med. necessity/level of care/duration of stay  If discussed at [place] of Stay Meetings, dates discussed:    Comments:  10/11/13 1600 Drako Maese RN/CM 10/07/13 1500 Caree Wolpert RN/CM

## 2013-10-07 NOTE — Progress Notes (Signed)
[facility] PROGRESS NOTE  Colin Lee QHU:[id number] DOB: 10/25/1960 DOA: 10/06/2013 PCP: Bronson Curb, PA-C  Assessment/Plan: 1. Left pleural effusion. Appears to be exudative. Etiology is not entirely clear. Status post thoracentesis. May be related to infectious cause versus autoimmune. Significant WBC noted in pleural fluid with eosinophilic predominance. CT scan did not indicate any underlying malignancy. Fluid has been sent for cytology. We'll request pulmonary consultation to see if bronchoscopy will be needed. He is currently on antibiotics. ESR and rheumatoid factor were negative. ANA is currently pending. 2. HTN stable 3. Restless legs, started on requip 4. GERD. On PPI  Code Status: Full code Family Communication: Discussed with patient and family at the bedside Disposition Plan: Discharge home once improved   Consultants:    Procedures:  Thoracentesis of left pleural effusion with removal of 1040 mL  Antibiotics:  Rocephin 7/1>>  Azithromycin 7/2>>  HPI/Subjective: Feeling better post thoracentesis  Objective: Filed Vitals:   10/07/13 1826  BP:   Pulse:   Temp: 99 F (37.2 C)  Resp:     Intake/Output Summary (Last 24 hours) at 10/07/13 1902 Last data filed at 10/07/13 1800  Gross per 24 hour  Intake 1281.67 ml  Output   2240 ml  Net -958.33 ml   Filed Weights   10/06/13 1832 10/06/13 2224  Weight: 127.007 kg (280 lb) 125.873 kg (277 lb 8 oz)    Exam:   General:  NAD  Cardiovascular: S1, S2 RRR  Respiratory: diminished breath sounds at bases  Abdomen: soft,nt, nd, bs+  Musculoskeletal: no edema b/l   Data Reviewed: Basic Metabolic Panel:  Recent Labs Lab 10/06/13 1917 10/06/13 2252 10/07/13 0550  NA 142  --  140  K 4.3  --  4.4  CL 101  --  102  CO2 28  --  30  GLUCOSE 103*  --  104*  BUN 16  --  15  CREATININE 0.98 0.88 0.96  CALCIUM 9.3  --  9.0   Liver Function Tests:  Recent Labs Lab  10/06/13 1917 10/07/13 0550  AST 36 40*  ALT 53 54*  ALKPHOS 82 78  BILITOT 0.4 0.5  PROT 7.2 6.9  ALBUMIN 3.7 3.5   No results found for this basename: LIPASE, AMYLASE,  in the last 168 hours No results found for this basename: AMMONIA,  in the last 168 hours CBC:  Recent Labs Lab 10/06/13 1917 10/06/13 2252 10/07/13 0550  WBC 9.6 9.8 8.2  NEUTROABS 6.0  --   --   HGB 14.0 13.9 13.9  HCT 40.8 41.1 40.8  MCV 83.3 83.7 83.8  PLT 278 281 247   Cardiac Enzymes:  Recent Labs Lab 10/06/13 1942  TROPONINI <0.30   BNP (last 3 results)  Recent Labs  10/06/13 1942  PROBNP 79.8   CBG:  Recent Labs Lab 10/07/13 0812  GLUCAP 112*    No results found for this or any previous visit (from the past 240 hour(s)).   Studies: Dg Chest 1 View  10/07/2013   CLINICAL DATA:  Status post thoracentesis  EXAM: CHEST - 1 VIEW  COMPARISON:  October 07, 2018  FINDINGS: There is consolidation of left lung base. There is small to moderate left pleural effusion. There is no pneumothorax. There is small right pleural effusion. The heart size is enlarged. The mediastinal contour is stable. The soft tissues and osseous structures are stable.  IMPRESSION: Small to moderate left pleural effusion. There is no pneumothorax. There is consolidation  of left lung base. Small right pleural effusion.   Electronically Signed   By: Abelardo Diesel M.D.   On: 10/07/2013 11:14   Dg Chest 2 View  10/06/2013   CLINICAL DATA:  Chest pain/soreness  EXAM: CHEST  2 VIEW  COMPARISON:  None.  FINDINGS: Layering small to moderate left pleural effusion. Associated left lower lobe opacity, likely atelectasis. No focal consolidation. No pneumothorax.  The heart is top-normal in size.  Mild degenerative changes of the visualized thoracolumbar spine.  IMPRESSION: Layering small to moderate left pleural effusion.   Electronically Signed   By: Julian Hy M.D.   On: 10/06/2013 20:35   Ct Angio Chest Pe W/cm &/or Wo  Cm  10/06/2013   CLINICAL DATA:  Chest pain with difficulty breathing.  EXAM: CT ANGIOGRAPHY CHEST WITH CONTRAST  TECHNIQUE: Multidetector CT imaging of the chest was performed using the standard protocol during bolus administration of intravenous contrast. Multiplanar CT image reconstructions and MIPs were obtained to evaluate the vascular anatomy.  CONTRAST:  134m OMNIPAQUE IOHEXOL 350 MG/ML SOLN  COMPARISON:  PA and lateral chest x-ray of October 06, 2013  FINDINGS: There is a large left pleural effusion. There is parenchymal consolidation of much of the left lower lobe. The left upper lobe is adequately inflated. The right lung is well-expanded. There are mild emphysematous changes in the upper lobe.  Contrast within the pulmonary arterial tree is normal where visualized. Visualization of the peripheral pulmonary artery branches in the mid and lower left hemithorax is limited. The caliber of the thoracic aorta is normal. The cardiac chambers are normal in size. There is no bulky hilar lymphadenopathy. There is a mildly enlarged subcarinal lymph node measuring 14 mm in short axis.  The bony thorax is unremarkable. Within the upper abdomen there are calcified gallstones. The observed portions of the liver and spleen are unremarkable.  Review of the MIP images confirms the above findings.  IMPRESSION: 1. There is no acute pulmonary embolism nor acute thoracic aortic pathology. 2. There is a large left pleural effusion with atelectasis of much of the left lower lobe. And obstructing mass is not clearly demonstrated. 3. There are mild emphysematous changes within the otherwise normal-appearing right lung. There is borderline to mild enlargement of right hilar and subcarinal lymph nodes.   Electronically Signed   By: David  JMartinique  On: 10/06/2013 21:59   [place]oracentesis Asp Pleural Space W/img Guide  10/07/2013   CLINICAL DATA:  LEFT pleural effusion  EXAM: [place]ORACENTESIS ASP PLEURAL SPACE W/IMG GUIDE  :  COMPARISON:  CT chest and chest radiograph of 10/06/2013  TECHNIQUE: Procedure, benefits, and risks of procedure were discussed with patient.  Written informed consent for procedure was obtained.  Time out protocol followed.  Pleural effusion localized at the posterior LEFT hemi thorax.  Skin prepped and draped in usual sterile fashion.  Skin and soft tissues anesthetized with 10 mL of 1% lidocaine.  8 French thoracentesis catheter placed into the LEFT pleural space.  1040 mL of yellow fluid aspirated by syringe pump.  Procedure tolerated well by patient without immediate complication.  180 mL of fluid was sent to laboratory for requested analysis.  IMPRESSION: Ultrasound-guided LEFT thoracentesis as above.   Electronically Signed   By: MLavonia DanaM.D.   On: 10/07/2013 11:10    Scheduled Meds: . azithromycin  500 mg Intravenous Q24H  . cefTRIAXone (ROCEPHIN)  IV  1 g Intravenous Q24H  . folic acid  1  mg Oral Daily  . heparin  5,000 Units Subcutaneous 3 times per day  . losartan  50 mg Oral Daily  . multivitamin with minerals  1 tablet Oral Daily  . pantoprazole  40 mg Oral Daily  . rOPINIRole  0.25 mg Oral QHS  . thiamine  100 mg Oral Daily   Continuous Infusions:   Principal Problem:   Pleural effusion Active Problems:   Hypertension    Time spent: 17mns    [name],[name]  [facility] Pager [phone] If 7PM-7AM, please contact night-coverage at [email or web address], password [email or web address]7/05/2013, 7:02 PM  LOS: 1 day

## 2013-10-07 NOTE — Progress Notes (Signed)
Pt states he has received 2 heparin Calvin 5,000 u (2300 yesterday, 0530 today), and 324 ASA at 1700 yesterday, alerted radiologist. Pt cleared for procedure by radiologist.

## 2013-10-07 NOTE — Progress Notes (Signed)
Patient returned from thoracentesis. Vital signs stable. Dressing to left back clean dry and itnact.

## 2013-10-07 NOTE — Progress Notes (Signed)
Pt calm, tolerating procedure well, consent signed and verified, questions answered by Thornton Papas MD, time out observed correctly prior to procedure start.

## 2013-10-07 NOTE — Progress Notes (Signed)
Procedure completed, 1040 ml clear yellow fluid removed. Pt tolerated procedure well.

## 2013-10-08 ENCOUNTER — Inpatient Hospital Stay (HOSPITAL_COMMUNITY): Payer: BC Managed Care – PPO

## 2013-10-08 DIAGNOSIS — J189 Pneumonia, unspecified organism: Secondary | ICD-10-CM

## 2013-10-08 LAB — BASIC METABOLIC PANEL
Anion gap: 11 (ref 5–15)
BUN: 13 mg/dL (ref 6–23)
CO2: 27 mEq/L (ref 19–32)
Calcium: 8.9 mg/dL (ref 8.4–10.5)
Chloride: 101 mEq/L (ref 96–112)
Creatinine, Ser: 0.93 mg/dL (ref 0.50–1.35)
GFR calc Af Amer: >90 mL/min (ref >90)
GFR calc non Af Amer: >90 mL/min (ref >90)
Glucose, Bld: 107 mg/dL — ABNORMAL HIGH (ref 70–99)
Potassium: 4.2 mEq/L (ref 3.7–5.3)
Sodium: 139 mEq/L (ref 137–147)

## 2013-10-08 LAB — CBC
HCT: 40.4 % (ref 39.0–52.0)
Hemoglobin: 13.8 g/dL (ref 13.0–17.0)
MCH: 28.4 pg (ref 26.0–34.0)
MCHC: 34.2 g/dL (ref 30.0–36.0)
MCV: 83.1 fL (ref 78.0–100.0)
Platelets: 260 10*3/uL (ref 150–400)
RBC: 4.86 MIL/uL (ref 4.22–5.81)
RDW: 12.9 % (ref 11.5–15.5)
WBC: 12.1 10*3/uL — ABNORMAL HIGH (ref 4.0–10.5)

## 2013-10-08 LAB — GLUCOSE, CAPILLARY: Glucose-Capillary: 114 mg/dL — ABNORMAL HIGH (ref 70–99)

## 2013-10-08 MED ORDER — DEXTROSE 5 % IV SOLN
INTRAVENOUS | Status: AC
  Filled 2013-10-08: qty 10

## 2013-10-08 MED ORDER — VANCOMYCIN HCL 10 G IV SOLR
1500.0000 mg | Freq: Once | INTRAVENOUS | Status: AC
  Administered 2013-10-08: 1500 mg via INTRAVENOUS
  Filled 2013-10-08: qty 1500

## 2013-10-08 MED ORDER — VANCOMYCIN HCL 10 G IV SOLR
1250.0000 mg | Freq: Two times a day (BID) | INTRAVENOUS | Status: DC
  Administered 2013-10-09 – 2013-10-11 (×5): 1250 mg via INTRAVENOUS
  Filled 2013-10-08 (×7): qty 1250

## 2013-10-08 MED ORDER — OXYCODONE HCL 5 MG PO TABS
5.0000 mg | ORAL_TABLET | ORAL | Status: DC | PRN
  Administered 2013-10-08: 5 mg via ORAL
  Administered 2013-10-09: 10 mg via ORAL
  Administered 2013-10-09 (×2): 5 mg via ORAL
  Administered 2013-10-09: 10 mg via ORAL
  Filled 2013-10-08 (×2): qty 1
  Filled 2013-10-08 (×2): qty 2
  Filled 2013-10-08: qty 1

## 2013-10-08 NOTE — Progress Notes (Signed)
Patient c/o of right side/back pain.  States it was work that it was this morning.  States it was worse with inspiration.  Also states that his wife felt a "knot" on his lower right back.  RN assessed and felt area of tissue to right lower back near lower rib cage - area nontender.  Dr. Roderic Palau on unit and notified.  Will be in to see patient this afternoon.

## 2013-10-08 NOTE — Progress Notes (Signed)
[facility] PROGRESS NOTE  Colin Lee QVZ:[id number] DOB: 05-Feb-1961 DOA: 10/06/2013 PCP: Bronson Curb, PA-C  Assessment/Plan: 1. Left pleural effusion. Appears to be exudative. Status post thoracentesis. May be related to infectious cause versus autoimmune. Significant WBC noted in pleural fluid with eosinophilic predominance. CT scan did not indicate any underlying malignancy. Fluid has been sent for cytology. Patient was seen by Dr. Luan Pulling in case was discussed. It is felt that his pleural effusion is likely related to an underlying pneumonia. He's on Rocephin and azithromycin but continues to have high-grade fevers. Will add vancomycin. 2. HTN stable 3. Restless legs, started on requip 4. GERD. On PPI  Code Status: Full code Family Communication: Discussed with patient and family at the bedside Disposition Plan: Discharge home once improved   Consultants:  Pulmonology  Procedures:  Thoracentesis of left pleural effusion with removal of 1040 mL  Antibiotics:  Rocephin 7/1>>  Azithromycin 7/2>>  HPI/Subjective: Does not really have any significant cough. Feels breathing is improved. Ambulating without difficulty.  Objective: Filed Vitals:   10/08/13 1955  BP:   Pulse:   Temp: 102.2 F (39 C)  Resp:     Intake/Output Summary (Last 24 hours) at 10/08/13 2027 Last data filed at 10/08/13 0900  Gross per 24 hour  Intake    660 ml  Output    400 ml  Net    260 ml   Filed Weights   10/06/13 1832 10/06/13 2224  Weight: 127.007 kg (280 lb) 125.873 kg (277 lb 8 oz)    Exam:   General:  NAD  Cardiovascular: S1, S2 RRR  Respiratory: diminished breath sounds at bases, but otherwise clear  Abdomen: soft,nt, nd, bs+  Musculoskeletal: no edema b/l   Data Reviewed: Basic Metabolic Panel:  Recent Labs Lab 10/06/13 1917 10/06/13 2252 10/07/13 0550 10/08/13 0541  NA 142  --  140 139  K 4.3  --  4.4 4.2  CL 101  --  102 101  CO2 28   --  30 27  GLUCOSE 103*  --  104* 107*  BUN 16  --  15 13  CREATININE 0.98 0.88 0.96 0.93  CALCIUM 9.3  --  9.0 8.9   Liver Function Tests:  Recent Labs Lab 10/06/13 1917 10/07/13 0550  AST 36 40*  ALT 53 54*  ALKPHOS 82 78  BILITOT 0.4 0.5  PROT 7.2 6.9  ALBUMIN 3.7 3.5   No results found for this basename: LIPASE, AMYLASE,  in the last 168 hours No results found for this basename: AMMONIA,  in the last 168 hours CBC:  Recent Labs Lab 10/06/13 1917 10/06/13 2252 10/07/13 0550 10/08/13 0541  WBC 9.6 9.8 8.2 12.1*  NEUTROABS 6.0  --   --   --   HGB 14.0 13.9 13.9 13.8  HCT 40.8 41.1 40.8 40.4  MCV 83.3 83.7 83.8 83.1  PLT 278 281 247 260   Cardiac Enzymes:  Recent Labs Lab 10/06/13 1942  TROPONINI <0.30   BNP (last 3 results)  Recent Labs  10/06/13 1942  PROBNP 79.8   CBG:  Recent Labs Lab 10/07/13 0812 10/08/13 0756  GLUCAP 112* 114*    Recent Results (from the past 240 hour(s))  BODY FLUID CULTURE     Status: None   Collection Time    10/07/13 11:00 AM      Result Value Ref Range Status   Specimen Description THORACENTESIS   Final   Special Requests THORACENTESIS   Final  Gram Stain     Final   Value: RARE WBC PRESENT,BOTH PMN AND MONONUCLEAR     NO ORGANISMS SEEN     Performed at [facility]   Culture     Final   Value: NO GROWTH 1 DAY     Performed at [facility]   Report Status PENDING   Incomplete     Studies: Dg Chest 1 View  10/07/2013   CLINICAL DATA:  Status post thoracentesis  EXAM: CHEST - 1 VIEW  COMPARISON:  October 07, 2018  FINDINGS: There is consolidation of left lung base. There is small to moderate left pleural effusion. There is no pneumothorax. There is small right pleural effusion. The heart size is enlarged. The mediastinal contour is stable. The soft tissues and osseous structures are stable.  IMPRESSION: Small to moderate left pleural effusion. There is no pneumothorax. There is consolidation of left  lung base. Small right pleural effusion.   Electronically Signed   By: Abelardo Diesel M.D.   On: 10/07/2013 11:14   Dg Chest 2 View  10/08/2013   CLINICAL DATA:  Shortness of breath status post left-sided thoracentesis yesterday  EXAM: CHEST  2 VIEW  COMPARISON:  End expiratory post thoracentesis film of October 07, 2013  FINDINGS: There remains a small amount of pleural fluid on the left. There is a trace of pleural fluid on the right. There is no pneumothorax nor pneumomediastinum. The right lung is adequately inflated and clear. The cardiac silhouette is top-normal in size. The central pulmonary vascularity is mildly prominent but is more distinct than on yesterday's study. The bony thorax is unremarkable.  IMPRESSION: There is no pneumothorax. There is a small amount of pleural fluid on the left but less than that seen on the pre thoracentesis study of October 06, 2013. There is a trace of pleural fluid on the right today which is new.   Electronically Signed   By: David  Martinique   On: 10/08/2013 18:11   Ct Angio Chest Pe W/cm &/or Wo Cm  10/06/2013   CLINICAL DATA:  Chest pain with difficulty breathing.  EXAM: CT ANGIOGRAPHY CHEST WITH CONTRAST  TECHNIQUE: Multidetector CT imaging of the chest was performed using the standard protocol during bolus administration of intravenous contrast. Multiplanar CT image reconstructions and MIPs were obtained to evaluate the vascular anatomy.  CONTRAST:  129mL OMNIPAQUE IOHEXOL 350 MG/ML SOLN  COMPARISON:  PA and lateral chest x-ray of October 06, 2013  FINDINGS: There is a large left pleural effusion. There is parenchymal consolidation of much of the left lower lobe. The left upper lobe is adequately inflated. The right lung is well-expanded. There are mild emphysematous changes in the upper lobe.  Contrast within the pulmonary arterial tree is normal where visualized. Visualization of the peripheral pulmonary artery branches in the mid and lower left hemithorax is limited. The  caliber of the thoracic aorta is normal. The cardiac chambers are normal in size. There is no bulky hilar lymphadenopathy. There is a mildly enlarged subcarinal lymph node measuring 14 mm in short axis.  The bony thorax is unremarkable. Within the upper abdomen there are calcified gallstones. The observed portions of the liver and spleen are unremarkable.  Review of the MIP images confirms the above findings.  IMPRESSION: 1. There is no acute pulmonary embolism nor acute thoracic aortic pathology. 2. There is a large left pleural effusion with atelectasis of much of the left lower lobe. And obstructing mass is not  clearly demonstrated. 3. There are mild emphysematous changes within the otherwise normal-appearing right lung. There is borderline to mild enlargement of right hilar and subcarinal lymph nodes.   Electronically Signed   By: David  Martinique   On: 10/06/2013 21:59   US Thoracentesis Asp Pleural Space W/img Guide  10/07/2013   CLINICAL DATA:  LEFT pleural effusion  EXAM: US THORACENTESIS ASP PLEURAL SPACE W/IMG GUIDE  : COMPARISON:  CT chest and chest radiograph of 10/06/2013  TECHNIQUE: Procedure, benefits, and risks of procedure were discussed with patient.  Written informed consent for procedure was obtained.  Time out protocol followed.  Pleural effusion localized at the posterior LEFT hemi thorax.  Skin prepped and draped in usual sterile fashion.  Skin and soft tissues anesthetized with 10 mL of 1% lidocaine.  8 French thoracentesis catheter placed into the LEFT pleural space.  1040 mL of yellow fluid aspirated by syringe pump.  Procedure tolerated well by patient without immediate complication.  180 mL of fluid was sent to laboratory for requested analysis.  IMPRESSION: Ultrasound-guided LEFT thoracentesis as above.   Electronically Signed   By: Lavonia Dana M.D.   On: 10/07/2013 11:10    Scheduled Meds: . azithromycin  500 mg Intravenous Q24H  . cefTRIAXone (ROCEPHIN)  IV  1 g Intravenous Q24H   . folic acid  1 mg Oral Daily  . heparin  5,000 Units Subcutaneous 3 times per day  . losartan  50 mg Oral Daily  . multivitamin with minerals  1 tablet Oral Daily  . pantoprazole  40 mg Oral Daily  . rOPINIRole  0.25 mg Oral QHS  . thiamine  100 mg Oral Daily   Continuous Infusions:   Principal Problem:   Pleural effusion Active Problems:   Hypertension    Time spent: 61mins    [name],[name]  [facility] Pager [phone]. If 7PM-7AM, please contact night-coverage at [email or web address], password [facility] 10/08/2013, 8:27 PM  LOS: 2 days

## 2013-10-08 NOTE — Consult Note (Signed)
Consult requested by: [facility] Consult requested for pleural effusion:  HPI: This is a 53 year old who came to the emergency room because of shortness of breath. He was having pain in his chest. He said he had a cough and it was thought that he had some sort of a bronchitis and he was started on a Z-Pak and steroids. He says his pain has been pleuritic. When he was in the emergency department he was noted to be somewhat hypoxic. He had CT of the chest that showed no pulmonary embolus but he did have a moderate sized left pleural effusion. He had thoracentesis done yesterday which showed an exudative effusion with a predominantly eosinophilic white blood cell count. He started having more fever last night and is now on IV antibiotics. He says he had a history of asthma in childhood and he still has wheezing and some shortness of breath when he is exposed to dust fumes et Ronney Asters. He thinks he had some asbestos exposure in the past.  Past Medical History  Diagnosis Date  . GERD (gastroesophageal reflux disease)   . Hypertension   . Shortness of breath     Starting May 2015     Family History  Problem Relation Age of Onset  . Adopted: Yes     History   Social History  . Marital Status: Married    Spouse Name: N/A    Number of Children: N/A  . Years of Education: N/A   Social History Main Topics  . Smoking status: Former Smoker -- 1.00 packs/day for 30 years    Types: Cigarettes    Quit date: 04/08/2000  . Smokeless tobacco: Never Used  . Alcohol Use: Yes     Comment: rare  . Drug Use: No  . Sexual Activity: None   Other Topics Concern  . None   Social History Narrative  . None     ROS: He has not had any swelling of his legs. He had some abdominal swelling he thinks is mostly being overweight. He's not coughed up any blood. No diaphoresis. He had low-grade fever at home.    Objective: Vital signs in last 24 hours: Temp:  [98.2 F (36.8 C)-100.5 F (38.1 C)]  98.6 F (37 C) (07/03 0611) Pulse Rate:  [88-97] 96 (07/03 0611) Resp:  [17-20] 20 (07/03 0611) BP: (115-143)/(70-91) 121/71 mmHg (07/03 0611) SpO2:  [92 %-98 %] 92 % (07/03 4332) Weight change:  Last BM Date: 10/07/13  Intake/Output from previous day: 07/02 0701 - 07/03 0700 In: 1180 [P.O.:480; I.V.:400; IV Piggyback:300] Out: 1840 [Urine:800]  PHYSICAL EXAM He is awake and alert and looks comfortable. His HEENT examination is generally unremarkable. His neck is supple. His chest shows mildly diminished breath sounds on the left. No wheezing. His heart is regular without gallop. His abdomen is soft without masses. Extremities showed no edema. Central nervous system exam is grossly intact  Lab Results: Basic Metabolic Panel:  Recent Labs  10/07/13 0550 10/08/13 0541  NA 140 139  K 4.4 4.2  CL 102 101  CO2 30 27  GLUCOSE 104* 107*  BUN 15 13  CREATININE 0.96 0.93  CALCIUM 9.0 8.9   Liver Function Tests:  Recent Labs  10/06/13 1917 10/07/13 0550  AST 36 40*  ALT 53 54*  ALKPHOS 82 78  BILITOT 0.4 0.5  PROT 7.2 6.9  ALBUMIN 3.7 3.5   No results found for this basename: LIPASE, AMYLASE,  in the last 72 hours No results  found for this basename: AMMONIA,  in the last 72 hours CBC:  Recent Labs  10/06/13 1917  10/07/13 0550 10/08/13 0541  WBC 9.6  < > 8.2 12.1*  NEUTROABS 6.0  --   --   --   HGB 14.0  < > 13.9 13.8  HCT 40.8  < > 40.8 40.4  MCV 83.3  < > 83.8 83.1  PLT 278  < > 247 260  < > = values in this interval not displayed. Cardiac Enzymes:  Recent Labs  10/06/13 1942  TROPONINI <0.30   BNP:  Recent Labs  10/06/13 1942  PROBNP 79.8   D-Dimer: No results found for this basename: DDIMER,  in the last 72 hours CBG:  Recent Labs  10/07/13 0812 10/08/13 0756  GLUCAP 112* 114*   Hemoglobin A1C:  Recent Labs  10/06/13 2252  HGBA1C 6.1*   Fasting Lipid Panel: No results found for this basename: CHOL, HDL, LDLCALC, TRIG, CHOLHDL,  LDLDIRECT,  in the last 72 hours Thyroid Function Tests:  Recent Labs  10/06/13 2252  TSH 2.100   Anemia Panel:  Recent Labs  10/06/13 2252  TIBC 312  IRON 55   Coagulation: No results found for this basename: LABPROT, INR,  in the last 72 hours Urine Drug Screen: Drugs of Abuse  No results found for this basename: labopia, cocainscrnur, labbenz, amphetmu, thcu, labbarb    Alcohol Level: No results found for this basename: ETH,  in the last 72 hours Urinalysis: No results found for this basename: COLORURINE, APPERANCEUR, LABSPEC, PHURINE, GLUCOSEU, HGBUR, BILIRUBINUR, KETONESUR, PROTEINUR, UROBILINOGEN, NITRITE, LEUKOCYTESUR,  in the last 72 hours Misc. Labs:   ABGS: No results found for this basename: PHART, PCO2, PO2ART, TCO2, HCO3,  in the last 72 hours   MICROBIOLOGY: Recent Results (from the past 240 hour(s))  BODY FLUID CULTURE     Status: None   Collection Time    10/07/13 11:00 AM      Result Value Ref Range Status   Specimen Description THORACENTESIS   Final   Special Requests THORACENTESIS   Final   Gram Stain     Final   Value: RARE WBC PRESENT,BOTH PMN AND MONONUCLEAR     NO ORGANISMS SEEN     Performed at [facility]   Culture PENDING   Incomplete   Report Status PENDING   Incomplete    Studies/Results: Dg Chest 1 View  10/07/2013   CLINICAL DATA:  Status post thoracentesis  EXAM: CHEST - 1 VIEW  COMPARISON:  October 07, 2018  FINDINGS: There is consolidation of left lung base. There is small to moderate left pleural effusion. There is no pneumothorax. There is small right pleural effusion. The heart size is enlarged. The mediastinal contour is stable. The soft tissues and osseous structures are stable.  IMPRESSION: Small to moderate left pleural effusion. There is no pneumothorax. There is consolidation of left lung base. Small right pleural effusion.   Electronically Signed   By: Abelardo Diesel M.D.   On: 10/07/2013 11:14   Dg Chest 2  View  10/06/2013   CLINICAL DATA:  Chest pain/soreness  EXAM: CHEST  2 VIEW  COMPARISON:  None.  FINDINGS: Layering small to moderate left pleural effusion. Associated left lower lobe opacity, likely atelectasis. No focal consolidation. No pneumothorax.  The heart is top-normal in size.  Mild degenerative changes of the visualized thoracolumbar spine.  IMPRESSION: Layering small to moderate left pleural effusion.   Electronically Signed  By: Julian Hy M.D.   On: 10/06/2013 20:35   Ct Angio Chest Pe W/cm &/or Wo Cm  10/06/2013   CLINICAL DATA:  Chest pain with difficulty breathing.  EXAM: CT ANGIOGRAPHY CHEST WITH CONTRAST  TECHNIQUE: Multidetector CT imaging of the chest was performed using the standard protocol during bolus administration of intravenous contrast. Multiplanar CT image reconstructions and MIPs were obtained to evaluate the vascular anatomy.  CONTRAST:  155mL OMNIPAQUE IOHEXOL 350 MG/ML SOLN  COMPARISON:  PA and lateral chest x-ray of October 06, 2013  FINDINGS: There is a large left pleural effusion. There is parenchymal consolidation of much of the left lower lobe. The left upper lobe is adequately inflated. The right lung is well-expanded. There are mild emphysematous changes in the upper lobe.  Contrast within the pulmonary arterial tree is normal where visualized. Visualization of the peripheral pulmonary artery branches in the mid and lower left hemithorax is limited. The caliber of the thoracic aorta is normal. The cardiac chambers are normal in size. There is no bulky hilar lymphadenopathy. There is a mildly enlarged subcarinal lymph node measuring 14 mm in short axis.  The bony thorax is unremarkable. Within the upper abdomen there are calcified gallstones. The observed portions of the liver and spleen are unremarkable.  Review of the MIP images confirms the above findings.  IMPRESSION: 1. There is no acute pulmonary embolism nor acute thoracic aortic pathology. 2. There is a large  left pleural effusion with atelectasis of much of the left lower lobe. And obstructing mass is not clearly demonstrated. 3. There are mild emphysematous changes within the otherwise normal-appearing right lung. There is borderline to mild enlargement of right hilar and subcarinal lymph nodes.   Electronically Signed   By: David  Martinique   On: 10/06/2013 21:59   US Thoracentesis Asp Pleural Space W/img Guide  10/07/2013   CLINICAL DATA:  LEFT pleural effusion  EXAM: US THORACENTESIS ASP PLEURAL SPACE W/IMG GUIDE  : COMPARISON:  CT chest and chest radiograph of 10/06/2013  TECHNIQUE: Procedure, benefits, and risks of procedure were discussed with patient.  Written informed consent for procedure was obtained.  Time out protocol followed.  Pleural effusion localized at the posterior LEFT hemi thorax.  Skin prepped and draped in usual sterile fashion.  Skin and soft tissues anesthetized with 10 mL of 1% lidocaine.  8 French thoracentesis catheter placed into the LEFT pleural space.  1040 mL of yellow fluid aspirated by syringe pump.  Procedure tolerated well by patient without immediate complication.  180 mL of fluid was sent to laboratory for requested analysis.  IMPRESSION: Ultrasound-guided LEFT thoracentesis as above.   Electronically Signed   By: Lavonia Dana M.D.   On: 10/07/2013 11:10    Medications:  Prior to Admission:  Prescriptions prior to admission  Medication Sig Dispense Refill  . ibuprofen (ADVIL,MOTRIN) 600 MG tablet Take 600 mg by mouth every 6 (six) hours as needed. pain      . losartan (COZAAR) 50 MG tablet Take 50 mg by mouth daily.      Marland Kitchen omeprazole (PRILOSEC) 20 MG capsule Take 20 mg by mouth daily.       Scheduled: . azithromycin  500 mg Intravenous Q24H  . cefTRIAXone (ROCEPHIN)  IV  1 g Intravenous Q24H  . folic acid  1 mg Oral Daily  . heparin  5,000 Units Subcutaneous 3 times per day  . losartan  50 mg Oral Daily  . multivitamin with minerals  1  tablet Oral Daily  .  pantoprazole  40 mg Oral Daily  . rOPINIRole  0.25 mg Oral QHS  . thiamine  100 mg Oral Daily   Continuous:  PHX:[id number], acetaminophen, alum & mag hydroxide-simeth, ondansetron (ZOFRAN) IV, ondansetron, oxyCODONE, zolpidem  Assesment: He was admitted with a pleural effusion. This effusion is exudative. It has eosinophilic predominance. I think he probably had pneumonia considering his fever. He has some asthmatic component of his problem Principal Problem:   Pleural effusion Active Problems:   Hypertension    Plan: I would continue IV antibiotics today. Start incentive spirometry. Long term he will need followup until this effusion is totally resolved.    LOS: 2 days   Lorimer Tiberio L 10/08/2013, 8:46 AM

## 2013-10-08 NOTE — Progress Notes (Signed)
ANTIBIOTIC CONSULT NOTE - INITIAL  Pharmacy Consult for Vancomycin Indication: pneumonia  No Known Allergies  Patient Measurements: Height: 5\' 10"  (177.8 cm) Weight: 277 lb 8 oz (125.873 kg) IBW/kg (Calculated) : 73  Vital Signs: Temp: 102.2 F (39 C) (07/03 1955) Temp src: Oral (07/03 1955) BP: 118/73 mmHg (07/03 1300) Pulse Rate: 93 (07/03 1300) Intake/Output from previous day: 07/02 0701 - 07/03 0700 In: 1180 [P.O.:480; I.V.:400; IV Piggyback:300] Out: 1840 [Urine:800] Intake/Output from this shift:    Labs:  Recent Labs  10/06/13 2252 10/07/13 0550 10/08/13 0541  WBC 9.8 8.2 12.1*  HGB 13.9 13.9 13.8  PLT 281 247 260  CREATININE 0.88 0.96 0.93   Estimated Creatinine Clearance: 125.2 ml/min (by C-G formula based on Cr of 0.93). No results found for this basename: VANCOTROUGH, Corlis Leak, VANCORANDOM, [place], GENTPEAK, GENTRANDOM, TOBRATROUGH, TOBRAPEAK, TOBRARND, AMIKACINPEAK, AMIKACINTROU, AMIKACIN,  in the last 72 hours   Microbiology: Recent Results (from the past 720 hour(s))  BODY FLUID CULTURE     Status: None   Collection Time    10/07/13 11:00 AM      Result Value Ref Range Status   Specimen Description THORACENTESIS   Final   Special Requests THORACENTESIS   Final   Gram Stain     Final   Value: RARE WBC PRESENT,BOTH PMN AND MONONUCLEAR     NO ORGANISMS SEEN     Performed at [facility]   Culture     Final   Value: NO GROWTH 1 DAY     Performed at [facility]   Report Status PENDING   Incomplete    Medical History: Past Medical History  Diagnosis Date  . GERD (gastroesophageal reflux disease)   . Hypertension   . Shortness of breath     Starting May 2015    Medications:  Scheduled:  . azithromycin  500 mg Intravenous Q24H  . cefTRIAXone (ROCEPHIN)  IV  1 g Intravenous Q24H  . folic acid  1 mg Oral Daily  . heparin  5,000 Units Subcutaneous 3 times per day  . losartan  50 mg Oral Daily  . multivitamin with  minerals  1 tablet Oral Daily  . pantoprazole  40 mg Oral Daily  . rOPINIRole  0.25 mg Oral QHS  . thiamine  100 mg Oral Daily   Assessment: 53 yo obese M with pleural effusion s/p thoracentesis.  He has been Rocephin & Zithromax & continues to spike fevers.  WBC elevated.   Noted possibility of PNA or empyema per pulmonologist.   Blood cx pending.  Normalized CrCl ~ 80-48ml/min.  Vancomycin 7/3>> Rocephin 7/1>> Zithromax 7/2>>  Goal of Therapy:  Vancomycin trough level 15-20 mcg/ml  Plan:  Vancomycin 1500mg  IV x1 now then 1250mg  IV q12h Continue Rocephin & Zithromax Check Vancomycin trough at steady state Monitor renal function and cx data    Biagio Borg 10/08/2013,8:57 PM

## 2013-10-09 LAB — GLUCOSE, CAPILLARY: Glucose-Capillary: 148 mg/dL — ABNORMAL HIGH (ref 70–99)

## 2013-10-09 LAB — BASIC METABOLIC PANEL
Anion gap: 10 (ref 5–15)
BUN: 16 mg/dL (ref 6–23)
CO2: 26 mEq/L (ref 19–32)
Calcium: 8.9 mg/dL (ref 8.4–10.5)
Chloride: 99 mEq/L (ref 96–112)
Creatinine, Ser: 1.03 mg/dL (ref 0.50–1.35)
GFR calc Af Amer: >90 mL/min (ref >90)
GFR calc non Af Amer: 82 mL/min — ABNORMAL LOW (ref >90)
Glucose, Bld: 134 mg/dL — ABNORMAL HIGH (ref 70–99)
Potassium: 4.1 mEq/L (ref 3.7–5.3)
Sodium: 135 mEq/L — ABNORMAL LOW (ref 137–147)

## 2013-10-09 LAB — CBC
HCT: 40 % (ref 39.0–52.0)
Hemoglobin: 13.6 g/dL (ref 13.0–17.0)
MCH: 28.3 pg (ref 26.0–34.0)
MCHC: 34 g/dL (ref 30.0–36.0)
MCV: 83.2 fL (ref 78.0–100.0)
Platelets: 295 10*3/uL (ref 150–400)
RBC: 4.81 MIL/uL (ref 4.22–5.81)
RDW: 13.1 % (ref 11.5–15.5)
WBC: 13.2 10*3/uL — ABNORMAL HIGH (ref 4.0–10.5)

## 2013-10-09 NOTE — Progress Notes (Signed)
Patient complained of "feeling like he had a temperature".  Temperature was 102.41F. Called MD and order for vancomycin and blood culture were done. Patient was given tylenol and temperature decreased. Patient is in a stable condition.

## 2013-10-09 NOTE — Progress Notes (Signed)
[facility] PROGRESS NOTE  Colin Lee MWN:[id number] DOB: [birth year] DOA: 10/06/2013 PCP: Bronson Curb, PA-C  Assessment/Plan: 1. Left pleural effusion. Appears to be exudative. Status post thoracentesis. May be related to infectious cause versus autoimmune. Significant WBC noted in pleural fluid with eosinophilic predominance. CT scan did not indicate any underlying malignancy. Fluid has been sent for cytology. Pulmonology following. It is felt that his pleural effusion is likely related to an underlying pneumonia. Despite being on Rocephin and azithromycin, patient continued to have fevers. Vancomycin was added to his antibiotic regimen. Blood cultures were also sent. Leukocytosis is rising. He will need continued monitoring to document resolution of fevers. He continues to have fevers, will need to check repeat CT chest. 2. HTN stable 3. Restless legs, started on requip 4. GERD. On PPI  Code Status: Full code Family Communication: Discussed with patient and family at the bedside Disposition Plan: Discharge home once improved   Consultants:  Pulmonology  Procedures:  Thoracentesis of left pleural effusion with removal of 1040 mL  Antibiotics:  Rocephin 7/1>>  Azithromycin 7/2>>  Vancomycin 7/3>>  HPI/Subjective: Ambulate without difficulty. No significant shortness of breath. Does complain of pain in right lower back  Objective: Filed Vitals:   10/09/13 1615  BP: 149/85  Pulse: 102  Temp: 98 F (36.7 C)  Resp:     Intake/Output Summary (Last 24 hours) at 10/09/13 1934 Last data filed at 10/09/13 0900  Gross per 24 hour  Intake   1160 ml  Output    400 ml  Net    760 ml   Filed Weights   10/06/13 1832 10/06/13 2224  Weight: 127.007 kg (280 lb) 125.873 kg (277 lb 8 oz)    Exam:   General:  NAD  Cardiovascular: S1, S2 RRR  Respiratory: diminished breath sounds at bases, but otherwise clear  Abdomen: soft,nt, nd,  bs+  Musculoskeletal: no edema b/l   Data Reviewed: Basic Metabolic Panel:  Recent Labs Lab 10/06/13 1917 10/06/13 2252 10/07/13 0550 10/08/13 0541 10/09/13 0554  NA 142  --  140 139 135*  K 4.3  --  4.4 4.2 4.1  CL 101  --  102 101 99  CO2 28  --  30 27 26   GLUCOSE 103*  --  104* 107* 134*  BUN 16  --  15 13 16   CREATININE 0.98 0.88 0.96 0.93 1.03  CALCIUM 9.3  --  9.0 8.9 8.9   Liver Function Tests:  Recent Labs Lab 10/06/13 1917 10/07/13 0550  AST 36 40*  ALT 53 54*  ALKPHOS 82 78  BILITOT 0.4 0.5  PROT 7.2 6.9  ALBUMIN 3.7 3.5   No results found for this basename: LIPASE, AMYLASE,  in the last 168 hours No results found for this basename: AMMONIA,  in the last 168 hours CBC:  Recent Labs Lab 10/06/13 1917 10/06/13 2252 10/07/13 0550 10/08/13 0541 10/09/13 0554  WBC 9.6 9.8 8.2 12.1* 13.2*  NEUTROABS 6.0  --   --   --   --   HGB 14.0 13.9 13.9 13.8 13.6  HCT 40.8 41.1 40.8 40.4 40.0  MCV 83.3 83.7 83.8 83.1 83.2  PLT 278 281 247 260 295   Cardiac Enzymes:  Recent Labs Lab 10/06/13 1942  TROPONINI <0.30   BNP (last 3 results)  Recent Labs  10/06/13 1942  PROBNP 79.8   CBG:  Recent Labs Lab 10/07/13 0812 10/08/13 0756 10/09/13 0725  GLUCAP 112* 114* 148*  Recent Results (from the past 240 hour(s))  BODY FLUID CULTURE     Status: None   Collection Time    10/07/13 11:00 AM      Result Value Ref Range Status   Specimen Description THORACENTESIS   Final   Special Requests THORACENTESIS   Final   Gram Stain     Final   Value: RARE WBC PRESENT,BOTH PMN AND MONONUCLEAR     NO ORGANISMS SEEN     Performed at [facility]   Culture     Final   Value: NO GROWTH 2 DAYS     Performed at [facility]   Report Status PENDING   Incomplete  CULTURE, BLOOD (ROUTINE X 2)     Status: None   Collection Time    10/08/13  9:06 PM      Result Value Ref Range Status   Specimen Description BLOOD LEFT HAND   Final   Special  Requests BOTTLES DRAWN AEROBIC AND ANAEROBIC 10CC   Final   Culture NO GROWTH 1 DAY   Final   Report Status PENDING   Incomplete  CULTURE, BLOOD (ROUTINE X 2)     Status: None   Collection Time    10/08/13  9:08 PM      Result Value Ref Range Status   Specimen Description BLOOD RIGHT ARM   Final   Special Requests BOTTLES DRAWN AEROBIC AND ANAEROBIC 12CC   Final   Culture NO GROWTH 1 DAY   Final   Report Status PENDING   Incomplete     Studies: Dg Chest 2 View  10/08/2013   CLINICAL DATA:  Shortness of breath status post left-sided thoracentesis yesterday  EXAM: CHEST  2 VIEW  COMPARISON:  End expiratory post thoracentesis film of October 07, 2013  FINDINGS: There remains a small amount of pleural fluid on the left. There is a trace of pleural fluid on the right. There is no pneumothorax nor pneumomediastinum. The right lung is adequately inflated and clear. The cardiac silhouette is top-normal in size. The central pulmonary vascularity is mildly prominent but is more distinct than on yesterday's study. The bony thorax is unremarkable.  IMPRESSION: There is no pneumothorax. There is a small amount of pleural fluid on the left but less than that seen on the pre thoracentesis study of October 06, 2013. There is a trace of pleural fluid on the right today which is new.   Electronically Signed   By: David  Martinique   On: 10/08/2013 18:11    Scheduled Meds: . azithromycin  500 mg Intravenous Q24H  . cefTRIAXone (ROCEPHIN)  IV  1 g Intravenous Q24H  . folic acid  1 mg Oral Daily  . heparin  5,000 Units Subcutaneous 3 times per day  . losartan  50 mg Oral Daily  . multivitamin with minerals  1 tablet Oral Daily  . pantoprazole  40 mg Oral Daily  . rOPINIRole  0.25 mg Oral QHS  . thiamine  100 mg Oral Daily  . vancomycin  1,250 mg Intravenous Q12H   Continuous Infusions:   Principal Problem:   Pleural effusion Active Problems:   Hypertension    Time spent: 47mins    Elysabeth Aust  [facility] Pager [phone]. If 7PM-7AM, please contact night-coverage at [email or web address], password [facility] 10/09/2013, 7:34 PM  LOS: 3 days

## 2013-10-09 NOTE — Progress Notes (Signed)
Subjective: He says he still has some fever. His temperature was as high as 102. It was not mentioned on his chest x-ray reading but I think there may be some pneumonia associated with the pleural effusion.  Objective: Vital signs in last 24 hours: Temp:  [98.4 F (36.9 C)-102.2 F (39 C)] 98.4 F (36.9 C) (07/04 0601) Pulse Rate:  [93-104] 100 (07/04 0601) Resp:  [18-20] 20 (07/04 0601) BP: (104-118)/(68-77) 110/77 mmHg (07/04 0601) SpO2:  [94 %-96 %] 94 % (07/04 0601) Weight change:  Last BM Date: 10/08/13  Intake/Output from previous day: 07/03 0701 - 07/04 0700 In: 1160 [P.O.:360; IV Piggyback:800] Out: 400 [Urine:400]  PHYSICAL EXAM General appearance: alert, cooperative and no distress Resp: clear to auscultation bilaterally Cardio: regular rate and rhythm, S1, S2 normal, no murmur, click, rub or gallop GI: soft, non-tender; bowel sounds normal; no masses,  no organomegaly Extremities: extremities normal, atraumatic, no cyanosis or edema  Lab Results:  Results for orders placed during the hospital encounter of 10/06/13 (from the past 48 hour(s))  BODY FLUID CELL COUNT WITH DIFFERENTIAL     Status: Abnormal   Collection Time    10/07/13 11:00 AM      Result Value Ref Range   Fluid Type-FCT Body Fluid     Color, Fluid YELLOW  YELLOW   Appearance, Fluid HAZY  CLEAR   WBC, Fluid 1611 (*) 0 - 1000 cu mm   Neutrophil Count, Fluid 9  0 - 25 %   Lymphs, Fluid 6     Monocyte-Macrophage-Serous Fluid 18 (*) 50 - 90 %   Eos, Fluid 61     Other Cells, Fluid 6 BASOPHILS    BODY FLUID CULTURE     Status: None   Collection Time    10/07/13 11:00 AM      Result Value Ref Range   Specimen Description THORACENTESIS     Special Requests THORACENTESIS     Gram Stain       Value: RARE WBC PRESENT,BOTH PMN AND MONONUCLEAR     NO ORGANISMS SEEN     Performed at [facility]   Culture       Value: NO GROWTH 1 DAY     Performed at [facility]   Report Status  PENDING    ALBUMIN, FLUID     Status: None   Collection Time    10/07/13 11:00 AM      Result Value Ref Range   Albumin, Fluid 3.0     Comment: NO NORMAL RANGE ESTABLISHED FOR THIS TEST   Fluid Type-FALB Pleural Fld    LACTATE DEHYDROGENASE, BODY FLUID     Status: Abnormal   Collection Time    10/07/13 11:00 AM      Result Value Ref Range   LD, Fluid 500 (*) 3 - 23 U/L   Fluid Type-FLDH Pleural Fld    CBC     Status: Abnormal   Collection Time    10/08/13  5:41 AM      Result Value Ref Range   WBC 12.1 (*) 4.0 - 10.5 K/uL   RBC 4.86  4.22 - 5.81 MIL/uL   Hemoglobin 13.8  13.0 - 17.0 g/dL   HCT 40.4  39.0 - 52.0 %   MCV 83.1  78.0 - 100.0 fL   MCH 28.4  26.0 - 34.0 pg   MCHC 34.2  30.0 - 36.0 g/dL   RDW 12.9  11.5 - 15.5 %   Platelets  260  150 - 400 K/uL  BASIC METABOLIC PANEL     Status: Abnormal   Collection Time    10/08/13  5:41 AM      Result Value Ref Range   Sodium 139  137 - 147 mEq/L   Potassium 4.2  3.7 - 5.3 mEq/L   Chloride 101  96 - 112 mEq/L   CO2 27  19 - 32 mEq/L   Glucose, Bld 107 (*) 70 - 99 mg/dL   BUN 13  6 - 23 mg/dL   Creatinine, Ser 0.93  0.50 - 1.35 mg/dL   Calcium 8.9  8.4 - 10.5 mg/dL   GFR calc non Af Amer >90  >90 mL/min   GFR calc Af Amer >90  >90 mL/min   Comment: (NOTE)     The eGFR has been calculated using the CKD EPI equation.     This calculation has not been validated in all clinical situations.     eGFR's persistently <90 mL/min signify possible Chronic Kidney     Disease.   Anion gap 11  5 - 15  GLUCOSE, CAPILLARY     Status: Abnormal   Collection Time    10/08/13  7:56 AM      Result Value Ref Range   Glucose-Capillary 114 (*) 70 - 99 mg/dL  CULTURE, BLOOD (ROUTINE X 2)     Status: None   Collection Time    10/08/13  9:06 PM      Result Value Ref Range   Specimen Description BLOOD LEFT HAND     Special Requests BOTTLES DRAWN AEROBIC AND ANAEROBIC 10CC     Culture NO GROWTH 1 DAY     Report Status PENDING    CULTURE,  BLOOD (ROUTINE X 2)     Status: None   Collection Time    10/08/13  9:08 PM      Result Value Ref Range   Specimen Description BLOOD RIGHT ARM     Special Requests BOTTLES DRAWN AEROBIC AND ANAEROBIC 12CC     Culture NO GROWTH 1 DAY     Report Status PENDING    CBC     Status: Abnormal   Collection Time    10/09/13  5:54 AM      Result Value Ref Range   WBC 13.2 (*) 4.0 - 10.5 K/uL   RBC 4.81  4.22 - 5.81 MIL/uL   Hemoglobin 13.6  13.0 - 17.0 g/dL   HCT 40.0  39.0 - 52.0 %   MCV 83.2  78.0 - 100.0 fL   MCH 28.3  26.0 - 34.0 pg   MCHC 34.0  30.0 - 36.0 g/dL   RDW 13.1  11.5 - 15.5 %   Platelets 295  150 - 400 K/uL  BASIC METABOLIC PANEL     Status: Abnormal   Collection Time    10/09/13  5:54 AM      Result Value Ref Range   Sodium 135 (*) 137 - 147 mEq/L   Potassium 4.1  3.7 - 5.3 mEq/L   Chloride 99  96 - 112 mEq/L   CO2 26  19 - 32 mEq/L   Glucose, Bld 134 (*) 70 - 99 mg/dL   BUN 16  6 - 23 mg/dL   Creatinine, Ser 1.03  0.50 - 1.35 mg/dL   Calcium 8.9  8.4 - 10.5 mg/dL   GFR calc non Af Amer 82 (*) >90 mL/min   GFR calc Af Amer >90  >90 mL/min  Comment: (NOTE)     The eGFR has been calculated using the CKD EPI equation.     This calculation has not been validated in all clinical situations.     eGFR's persistently <90 mL/min signify possible Chronic Kidney     Disease.   Anion gap 10  5 - 15  GLUCOSE, CAPILLARY     Status: Abnormal   Collection Time    10/09/13  7:25 AM      Result Value Ref Range   Glucose-Capillary 148 (*) 70 - 99 mg/dL   Comment 1 Notify RN      ABGS No results found for this basename: PHART, PCO2, PO2ART, TCO2, HCO3,  in the last 72 hours CULTURES Recent Results (from the past 240 hour(s))  BODY FLUID CULTURE     Status: None   Collection Time    10/07/13 11:00 AM      Result Value Ref Range Status   Specimen Description THORACENTESIS   Final   Special Requests THORACENTESIS   Final   Gram Stain     Final   Value: RARE WBC  PRESENT,BOTH PMN AND MONONUCLEAR     NO ORGANISMS SEEN     Performed at [facility]   Culture     Final   Value: NO GROWTH 1 DAY     Performed at [facility]   Report Status PENDING   Incomplete  CULTURE, BLOOD (ROUTINE X 2)     Status: None   Collection Time    10/08/13  9:06 PM      Result Value Ref Range Status   Specimen Description BLOOD LEFT HAND   Final   Special Requests BOTTLES DRAWN AEROBIC AND ANAEROBIC 10CC   Final   Culture NO GROWTH 1 DAY   Final   Report Status PENDING   Incomplete  CULTURE, BLOOD (ROUTINE X 2)     Status: None   Collection Time    10/08/13  9:08 PM      Result Value Ref Range Status   Specimen Description BLOOD RIGHT ARM   Final   Special Requests BOTTLES DRAWN AEROBIC AND ANAEROBIC 12CC   Final   Culture NO GROWTH 1 DAY   Final   Report Status PENDING   Incomplete   Studies/Results: Dg Chest 1 View  10/07/2013   CLINICAL DATA:  Status post thoracentesis  EXAM: CHEST - 1 VIEW  COMPARISON:  October 07, 2018  FINDINGS: There is consolidation of left lung base. There is small to moderate left pleural effusion. There is no pneumothorax. There is small right pleural effusion. The heart size is enlarged. The mediastinal contour is stable. The soft tissues and osseous structures are stable.  IMPRESSION: Small to moderate left pleural effusion. There is no pneumothorax. There is consolidation of left lung base. Small right pleural effusion.   Electronically Signed   By: Abelardo Diesel M.D.   On: 10/07/2013 11:14   Dg Chest 2 View  10/08/2013   CLINICAL DATA:  Shortness of breath status post left-sided thoracentesis yesterday  EXAM: CHEST  2 VIEW  COMPARISON:  End expiratory post thoracentesis film of October 07, 2013  FINDINGS: There remains a small amount of pleural fluid on the left. There is a trace of pleural fluid on the right. There is no pneumothorax nor pneumomediastinum. The right lung is adequately inflated and clear. The cardiac silhouette is  top-normal in size. The central pulmonary vascularity is mildly prominent but is more distinct  than on yesterday's study. The bony thorax is unremarkable.  IMPRESSION: There is no pneumothorax. There is a small amount of pleural fluid on the left but less than that seen on the pre thoracentesis study of October 06, 2013. There is a trace of pleural fluid on the right today which is new.   Electronically Signed   By: David  Martinique   On: 10/08/2013 18:11   US Thoracentesis Asp Pleural Space W/img Guide  10/07/2013   CLINICAL DATA:  LEFT pleural effusion  EXAM: US THORACENTESIS ASP PLEURAL SPACE W/IMG GUIDE  : COMPARISON:  CT chest and chest radiograph of 10/06/2013  TECHNIQUE: Procedure, benefits, and risks of procedure were discussed with patient.  Written informed consent for procedure was obtained.  Time out protocol followed.  Pleural effusion localized at the posterior LEFT hemi thorax.  Skin prepped and draped in usual sterile fashion.  Skin and soft tissues anesthetized with 10 mL of 1% lidocaine.  8 French thoracentesis catheter placed into the LEFT pleural space.  1040 mL of yellow fluid aspirated by syringe pump.  Procedure tolerated well by patient without immediate complication.  180 mL of fluid was sent to laboratory for requested analysis.  IMPRESSION: Ultrasound-guided LEFT thoracentesis as above.   Electronically Signed   By: Lavonia Dana M.D.   On: 10/07/2013 11:10    Medications:  Prior to Admission:  Prescriptions prior to admission  Medication Sig Dispense Refill  . ibuprofen (ADVIL,MOTRIN) 600 MG tablet Take 600 mg by mouth every 6 (six) hours as needed. pain      . losartan (COZAAR) 50 MG tablet Take 50 mg by mouth daily.      Marland Kitchen omeprazole (PRILOSEC) 20 MG capsule Take 20 mg by mouth daily.       Scheduled: . azithromycin  500 mg Intravenous Q24H  . cefTRIAXone (ROCEPHIN)  IV  1 g Intravenous Q24H  . folic acid  1 mg Oral Daily  . heparin  5,000 Units Subcutaneous 3 times per day  .  losartan  50 mg Oral Daily  . multivitamin with minerals  1 tablet Oral Daily  . pantoprazole  40 mg Oral Daily  . rOPINIRole  0.25 mg Oral QHS  . thiamine  100 mg Oral Daily  . vancomycin  1,250 mg Intravenous Q12H   Continuous:  QTM:[id number], acetaminophen, alum & mag hydroxide-simeth, ondansetron (ZOFRAN) IV, ondansetron, oxyCODONE, zolpidem  Assesment: He was admitted with an exudative pleural effusion. It has eosinophilic predominance. He's been having fevers. The pleural effusion did not look loculated on CT. I would plan to continue antibiotics and then if over the next 48 hours he doesn't clear his fever he'll need another CT to make sure the pleural effusion is not loculated and perhaps even an empyema. It did not look like an empyema on the thoracentesis. Await blood cultures continue treatment Principal Problem:   Pleural effusion Active Problems:   Hypertension    Plan: As above    LOS: 3 days   Kage Willmann L 10/09/2013, 8:39 AM

## 2013-10-10 LAB — BODY FLUID CULTURE: Culture: NO GROWTH

## 2013-10-10 LAB — GLUCOSE, CAPILLARY: Glucose-Capillary: 101 mg/dL — ABNORMAL HIGH (ref 70–99)

## 2013-10-10 NOTE — Progress Notes (Signed)
Subjective: He says he feels better. He had more low grade fever yesterday but said he was unaware of it and generally does feel better. He does not have any pain. He feels like he is breathing okay.  Objective: Vital signs in last 24 hours: Temp:  [98 F (36.7 C)-100.6 F (38.1 C)] 98.4 F (36.9 C) (07/05 0601) Pulse Rate:  [92-102] 92 (07/05 0601) Resp:  [16-18] 16 (07/05 0601) BP: (108-149)/(73-85) 114/73 mmHg (07/05 0601) SpO2:  [94 %-95 %] 95 % (07/05 0601) Weight change:  Last BM Date: 10/08/13  Intake/Output from previous day: 07/04 0701 - 07/05 0700 In: 1160 [P.O.:360; IV Piggyback:800] Out: -   PHYSICAL EXAM General appearance: alert, cooperative and no distress Resp: clear to auscultation bilaterally Cardio: regular rate and rhythm, S1, S2 normal, no murmur, click, rub or gallop GI: soft, non-tender; bowel sounds normal; no masses,  no organomegaly Extremities: extremities normal, atraumatic, no cyanosis or edema  Lab Results:  Results for orders placed during the hospital encounter of 10/06/13 (from the past 48 hour(s))  GLUCOSE, CAPILLARY     Status: Abnormal   Collection Time    10/08/13  7:56 AM      Result Value Ref Range   Glucose-Capillary 114 (*) 70 - 99 mg/dL  CULTURE, BLOOD (ROUTINE X 2)     Status: None   Collection Time    10/08/13  9:06 PM      Result Value Ref Range   Specimen Description BLOOD LEFT HAND     Special Requests BOTTLES DRAWN AEROBIC AND ANAEROBIC 10CC     Culture NO GROWTH 1 DAY     Report Status PENDING    CULTURE, BLOOD (ROUTINE X 2)     Status: None   Collection Time    10/08/13  9:08 PM      Result Value Ref Range   Specimen Description BLOOD RIGHT ARM     Special Requests BOTTLES DRAWN AEROBIC AND ANAEROBIC 12CC     Culture NO GROWTH 1 DAY     Report Status PENDING    CBC     Status: Abnormal   Collection Time    10/09/13  5:54 AM      Result Value Ref Range   WBC 13.2 (*) 4.0 - 10.5 K/uL   RBC 4.81  4.22 - 5.81  MIL/uL   Hemoglobin 13.6  13.0 - 17.0 g/dL   HCT 40.0  39.0 - 52.0 %   MCV 83.2  78.0 - 100.0 fL   MCH 28.3  26.0 - 34.0 pg   MCHC 34.0  30.0 - 36.0 g/dL   RDW 13.1  11.5 - 15.5 %   Platelets 295  150 - 400 K/uL  BASIC METABOLIC PANEL     Status: Abnormal   Collection Time    10/09/13  5:54 AM      Result Value Ref Range   Sodium 135 (*) 137 - 147 mEq/L   Potassium 4.1  3.7 - 5.3 mEq/L   Chloride 99  96 - 112 mEq/L   CO2 26  19 - 32 mEq/L   Glucose, Bld 134 (*) 70 - 99 mg/dL   BUN 16  6 - 23 mg/dL   Creatinine, Ser 1.03  0.50 - 1.35 mg/dL   Calcium 8.9  8.4 - 10.5 mg/dL   GFR calc non Af Amer 82 (*) >90 mL/min   GFR calc Af Amer >90  >90 mL/min   Comment: (NOTE)  The eGFR has been calculated using the CKD EPI equation.     This calculation has not been validated in all clinical situations.     eGFR's persistently <90 mL/min signify possible Chronic Kidney     Disease.   Anion gap 10  5 - 15  GLUCOSE, CAPILLARY     Status: Abnormal   Collection Time    10/09/13  7:25 AM      Result Value Ref Range   Glucose-Capillary 148 (*) 70 - 99 mg/dL   Comment 1 Notify RN      ABGS No results found for this basename: PHART, PCO2, PO2ART, TCO2, HCO3,  in the last 72 hours CULTURES Recent Results (from the past 240 hour(s))  BODY FLUID CULTURE     Status: None   Collection Time    10/07/13 11:00 AM      Result Value Ref Range Status   Specimen Description THORACENTESIS   Final   Special Requests THORACENTESIS   Final   Gram Stain     Final   Value: RARE WBC PRESENT,BOTH PMN AND MONONUCLEAR     NO ORGANISMS SEEN     Performed at [facility]   Culture     Final   Value: NO GROWTH 2 DAYS     Performed at [facility]   Report Status PENDING   Incomplete  CULTURE, BLOOD (ROUTINE X 2)     Status: None   Collection Time    10/08/13  9:06 PM      Result Value Ref Range Status   Specimen Description BLOOD LEFT HAND   Final   Special Requests BOTTLES DRAWN  AEROBIC AND ANAEROBIC 10CC   Final   Culture NO GROWTH 1 DAY   Final   Report Status PENDING   Incomplete  CULTURE, BLOOD (ROUTINE X 2)     Status: None   Collection Time    10/08/13  9:08 PM      Result Value Ref Range Status   Specimen Description BLOOD RIGHT ARM   Final   Special Requests BOTTLES DRAWN AEROBIC AND ANAEROBIC 12CC   Final   Culture NO GROWTH 1 DAY   Final   Report Status PENDING   Incomplete   Studies/Results: Dg Chest 2 View  10/08/2013   CLINICAL DATA:  Shortness of breath status post left-sided thoracentesis yesterday  EXAM: CHEST  2 VIEW  COMPARISON:  End expiratory post thoracentesis film of October 07, 2013  FINDINGS: There remains a small amount of pleural fluid on the left. There is a trace of pleural fluid on the right. There is no pneumothorax nor pneumomediastinum. The right lung is adequately inflated and clear. The cardiac silhouette is top-normal in size. The central pulmonary vascularity is mildly prominent but is more distinct than on yesterday's study. The bony thorax is unremarkable.  IMPRESSION: There is no pneumothorax. There is a small amount of pleural fluid on the left but less than that seen on the pre thoracentesis study of October 06, 2013. There is a trace of pleural fluid on the right today which is new.   Electronically Signed   By: David  Martinique   On: 10/08/2013 18:11    Medications:  Prior to Admission:  Prescriptions prior to admission  Medication Sig Dispense Refill  . ibuprofen (ADVIL,MOTRIN) 600 MG tablet Take 600 mg by mouth every 6 (six) hours as needed. pain      . losartan (COZAAR) 50 MG tablet Take 50  mg by mouth daily.      Marland Kitchen omeprazole (PRILOSEC) 20 MG capsule Take 20 mg by mouth daily.       Scheduled: . azithromycin  500 mg Intravenous Q24H  . cefTRIAXone (ROCEPHIN)  IV  1 g Intravenous Q24H  . folic acid  1 mg Oral Daily  . heparin  5,000 Units Subcutaneous 3 times per day  . losartan  50 mg Oral Daily  . multivitamin with minerals   1 tablet Oral Daily  . pantoprazole  40 mg Oral Daily  . rOPINIRole  0.25 mg Oral QHS  . thiamine  100 mg Oral Daily  . vancomycin  1,250 mg Intravenous Q12H   Continuous:  QMG:[id number], acetaminophen, alum & mag hydroxide-simeth, ondansetron (ZOFRAN) IV, ondansetron, oxyCODONE, zolpidem  Assesment: He was admitted with a pleural effusion. He's had fever and I think he probably has pneumonia in the area of lung was atelectatic due to the effusion. Principal Problem:   Pleural effusion Active Problems:   Hypertension    Plan: As previously noted I would continue current treatments and see what his fever does through today. If he continues to have significant fever today then I would plan to repeat CT to try to be sure if the fluid is loculated. It might be prudent to go ahead and do his abdomen and pelvis at the same setting if needed    LOS: 4 days   Nai Dasch L 10/10/2013, 7:41 AM

## 2013-10-10 NOTE — Progress Notes (Signed)
[facility] PROGRESS NOTE  Colin Lee ENI:[id number] DOB: 1960-12-27 DOA: 10/06/2013 PCP: Bronson Curb, PA-C  Assessment/Plan: 1. Left pleural effusion. Appears to be exudative. Status post thoracentesis. May be related to infectious cause versus autoimmune. Significant WBC noted in pleural fluid with eosinophilic predominance. CT scan did not indicate any underlying malignancy. Fluid has been sent for cytology. Pulmonology following. It is felt that his pleural effusion is likely related to an underlying pneumonia. He is currently on Rocephin, azithromycin and vancomycin. Blood cultures have shown no growth to date. Leukocytosis is rising. He will need continued monitoring to document resolution of fevers. If he continues to have fevers, will need to check repeat CT chest. 2. HTN. stable 3. Restless legs, started on requip, improved 4. GERD. On PPI  Code Status: Full code Family Communication: Discussed with patient and family at the bedside Disposition Plan: Discharge home once improved   Consultants:  Pulmonology  Procedures:  Thoracentesis of left pleural effusion with removal of 1040 mL  Antibiotics:  Rocephin 7/1>>  Azithromycin 7/2>>  Vancomycin 7/3>>  HPI/Subjective: Had some low grade fever last night.  No complaints today.  Feels well overall  Objective: Filed Vitals:   10/10/13 1442  BP: 133/74  Pulse: 94  Temp: 97.1 F (36.2 C)  Resp: 20    Intake/Output Summary (Last 24 hours) at 10/10/13 1545 Last data filed at 10/10/13 0600  Gross per 24 hour  Intake    800 ml  Output      0 ml  Net    800 ml   Filed Weights   10/06/13 1832 10/06/13 2224  Weight: 127.007 kg (280 lb) 125.873 kg (277 lb 8 oz)    Exam:   General:  NAD  Cardiovascular: S1, S2 RRR  Respiratory: diminished breath sounds at bases, but otherwise clear  Abdomen: soft,nt, nd, bs+  Musculoskeletal: no edema b/l   Data Reviewed: Basic Metabolic  Panel:  Recent Labs Lab 10/06/13 1917 10/06/13 2252 10/07/13 0550 10/08/13 0541 10/09/13 0554  NA 142  --  140 139 135*  K 4.3  --  4.4 4.2 4.1  CL 101  --  102 101 99  CO2 28  --  30 27 26   GLUCOSE 103*  --  104* 107* 134*  BUN 16  --  15 13 16   CREATININE 0.98 0.88 0.96 0.93 1.03  CALCIUM 9.3  --  9.0 8.9 8.9   Liver Function Tests:  Recent Labs Lab 10/06/13 1917 10/07/13 0550  AST 36 40*  ALT 53 54*  ALKPHOS 82 78  BILITOT 0.4 0.5  PROT 7.2 6.9  ALBUMIN 3.7 3.5   No results found for this basename: LIPASE, AMYLASE,  in the last 168 hours No results found for this basename: AMMONIA,  in the last 168 hours CBC:  Recent Labs Lab 10/06/13 1917 10/06/13 2252 10/07/13 0550 10/08/13 0541 10/09/13 0554  WBC 9.6 9.8 8.2 12.1* 13.2*  NEUTROABS 6.0  --   --   --   --   HGB 14.0 13.9 13.9 13.8 13.6  HCT 40.8 41.1 40.8 40.4 40.0  MCV 83.3 83.7 83.8 83.1 83.2  PLT 278 281 247 260 295   Cardiac Enzymes:  Recent Labs Lab 10/06/13 1942  TROPONINI <0.30   BNP (last 3 results)  Recent Labs  10/06/13 1942  PROBNP 79.8   CBG:  Recent Labs Lab 10/07/13 0812 10/08/13 0756 10/09/13 0725 10/10/13 0748  GLUCAP 112* 114* 148* 101*  Recent Results (from the past 240 hour(s))  BODY FLUID CULTURE     Status: None   Collection Time    10/07/13 11:00 AM      Result Value Ref Range Status   Specimen Description THORACENTESIS   Final   Special Requests THORACENTESIS   Final   Gram Stain     Final   Value: RARE WBC PRESENT,BOTH PMN AND MONONUCLEAR     NO ORGANISMS SEEN     Performed at [facility]   Culture     Final   Value: NO GROWTH 3 DAYS     Performed at [facility]   Report Status 10/10/2013 FINAL   Final  CULTURE, BLOOD (ROUTINE X 2)     Status: None   Collection Time    10/08/13  9:06 PM      Result Value Ref Range Status   Specimen Description BLOOD LEFT HAND   Final   Special Requests BOTTLES DRAWN AEROBIC AND ANAEROBIC 10CC    Final   Culture NO GROWTH 2 DAYS   Final   Report Status PENDING   Incomplete  CULTURE, BLOOD (ROUTINE X 2)     Status: None   Collection Time    10/08/13  9:08 PM      Result Value Ref Range Status   Specimen Description BLOOD RIGHT ARM   Final   Special Requests BOTTLES DRAWN AEROBIC AND ANAEROBIC 12CC   Final   Culture NO GROWTH 2 DAYS   Final   Report Status PENDING   Incomplete     Studies: Dg Chest 2 View  10/08/2013   CLINICAL DATA:  Shortness of breath status post left-sided thoracentesis yesterday  EXAM: CHEST  2 VIEW  COMPARISON:  End expiratory post thoracentesis film of October 07, 2013  FINDINGS: There remains a small amount of pleural fluid on the left. There is a trace of pleural fluid on the right. There is no pneumothorax nor pneumomediastinum. The right lung is adequately inflated and clear. The cardiac silhouette is top-normal in size. The central pulmonary vascularity is mildly prominent but is more distinct than on yesterday's study. The bony thorax is unremarkable.  IMPRESSION: There is no pneumothorax. There is a small amount of pleural fluid on the left but less than that seen on the pre thoracentesis study of October 06, 2013. There is a trace of pleural fluid on the right today which is new.   Electronically Signed   By: David  Martinique   On: 10/08/2013 18:11    Scheduled Meds: . azithromycin  500 mg Intravenous Q24H  . cefTRIAXone (ROCEPHIN)  IV  1 g Intravenous Q24H  . folic acid  1 mg Oral Daily  . heparin  5,000 Units Subcutaneous 3 times per day  . losartan  50 mg Oral Daily  . multivitamin with minerals  1 tablet Oral Daily  . pantoprazole  40 mg Oral Daily  . rOPINIRole  0.25 mg Oral QHS  . thiamine  100 mg Oral Daily  . vancomycin  1,250 mg Intravenous Q12H   Continuous Infusions:   Principal Problem:   Pleural effusion Active Problems:   Hypertension    Time spent: 50mins    [name],[name]  [facility] Pager [phone]. If 7PM-7AM, please  contact night-coverage at [email or web address], password [email or web address] 10/10/2013, 3:45 PM  LOS: 4 days

## 2013-10-11 LAB — ANTI-NUCLEAR AB-TITER (ANA TITER): ANA Titer 1: 1:160 {titer} — ABNORMAL HIGH

## 2013-10-11 LAB — ANCA SCREEN W REFLEX TITER
Atypical p-ANCA Screen: NEGATIVE
c-ANCA Screen: POSITIVE — AB
p-ANCA Screen: NEGATIVE

## 2013-10-11 LAB — ANCA TITERS: C-ANCA: 1:640 {titer} — ABNORMAL HIGH

## 2013-10-11 LAB — ANA: Anti Nuclear Antibody(ANA): POSITIVE — AB

## 2013-10-11 LAB — GLUCOSE, CAPILLARY: Glucose-Capillary: 147 mg/dL — ABNORMAL HIGH (ref 70–99)

## 2013-10-11 MED ORDER — ROPINIROLE HCL 0.25 MG PO TABS: 0.2500 mg | ORAL_TABLET | Freq: Every day | ORAL | Status: DC

## 2013-10-11 MED ORDER — LEVOFLOXACIN 750 MG PO TABS: 750.0000 mg | ORAL_TABLET | Freq: Every day | ORAL | Status: DC

## 2013-10-11 NOTE — Discharge Summary (Signed)
Physician Discharge Summary  Colin Lee UXN:[id number] DOB: 06/05/1960 DOA: 10/06/2013  PCP: Bronson Curb, PA-C  Admit date: 10/06/2013 Discharge date: 10/11/2013  Time spent: 40 minutes  Recommendations for Outpatient Follow-up:  1. Follow up with Dr. Luan Pulling in 2 weeks for repeat chest xray  Discharge Diagnoses:  Principal Problem:   Pleural effusion Active Problems:   Hypertension Pneumonia Restless legs syndrome  Discharge Condition: improved  Diet recommendation: low salt  Filed Weights   10/06/13 1832 10/06/13 2224  Weight: 127.007 kg (280 lb) 125.873 kg (277 lb 8 oz)    History of present illness:  Colin Lee is a 53 y.o. male who has been having pain in his chest since about May. He states he has had some cough was seen in the PCP office and was given a zpack. Patient states that he had also had some steroids. Patient states that he has noted some shortness of breath. He states that the pain is related to his breathing. It does not appear to be radiating anywhere. Patient states that he has no nausea and no vomiting, denies any fevers presently. 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 Patient in the ED was noted to drop his saturation to 89% with ambulation. In addition he states that he does have some chronic arthritis and takes Ibuprofen for this as well as RLS. On evaluation of the CXR he does show a pleural effusion on the left side.   Hospital Course:  This patient was admitted to the hospital with progressive shortness of breath and cough. He was found to have a left-sided pleural effusion with associated consolidation. He underwent thoracentesis with 1040 mL of fluid removed. Pulmonology was consulted who followed the patient. Patient had CT scan of his chest which did not indicate any clear malignancy. Cytology on fluid is currently in process. Fluid analysis indicated a possible  parapneumonic effusion. The patient was started on intravenous antibiotics. He did initially had fevers which subsequently resolved. Patient is feeling significantly improved. He'll be continued on oral antibiotics on discharge. He'll followup with Dr. Luan Pulling in 2 weeks for repeat chest x-ray to ensure resolution of consolidation.  Procedures: Thoracentesis of left pleural effusion with removal of 1040 mL   Consultations:  Pulmonology  Discharge Exam: Filed Vitals:   10/11/13 1429  BP: 120/76  Pulse: 101  Temp: 98.9 F (37.2 C)  Resp: 20    General: No acute distress Cardiovascular: S1, S2, regular rate and rhythm Respiratory: Diminished breath sounds at bases  Discharge Instructions You were cared for by a hospitalist during your hospital stay. If you have any questions about your discharge medications or the care you received while you were in the hospital after you are discharged, you can call the unit and asked to speak with the hospitalist on call if the hospitalist that took care of you is not available. Once you are discharged, your primary care physician will handle any further medical issues. Please note that NO REFILLS for any discharge medications will be authorized once you are discharged, as it is imperative that you return to your primary care physician (or establish a relationship with a primary care physician if you do not have one) for your aftercare needs so that they can reassess your need for medications and monitor your lab values.  Discharge Instructions   Call MD for:  difficulty breathing, headache or visual disturbances  Complete by:  As directed      Call MD for:  temperature >100.4    Complete by:  As directed      Diet - low sodium heart healthy    Complete by:  As directed      Increase activity slowly    Complete by:  As directed             Medication List         ibuprofen 600 MG tablet  Commonly known as:  ADVIL,MOTRIN  Take 600 mg by  mouth every 6 (six) hours as needed. pain     levofloxacin 750 MG tablet  Commonly known as:  LEVAQUIN  Take 1 tablet (750 mg total) by mouth daily.     losartan 50 MG tablet  Commonly known as:  COZAAR  Take 50 mg by mouth daily.     omeprazole 20 MG capsule  Commonly known as:  PRILOSEC  Take 20 mg by mouth daily.     rOPINIRole 0.25 MG tablet  Commonly known as:  REQUIP  Take 1 tablet (0.25 mg total) by mouth at bedtime.       No Known Allergies     Follow-up Information   Follow up with HAWKINS,EDWARD L, MD. Schedule an appointment as soon as possible for a visit in 2 weeks.   Specialty:  Pulmonary Disease   Contact information:   [address] [place] [address] [phone]        The results of significant diagnostics from this hospitalization (including imaging, microbiology, ancillary and laboratory) are listed below for reference.    Significant Diagnostic Studies: Dg Chest 1 View  10/07/2013   CLINICAL DATA:  Status post thoracentesis  EXAM: CHEST - 1 VIEW  COMPARISON:  October 07, 2018  FINDINGS: There is consolidation of left lung base. There is small to moderate left pleural effusion. There is no pneumothorax. There is small right pleural effusion. The heart size is enlarged. The mediastinal contour is stable. The soft tissues and osseous structures are stable.  IMPRESSION: Small to moderate left pleural effusion. There is no pneumothorax. There is consolidation of left lung base. Small right pleural effusion.   Electronically Signed   By: Abelardo Diesel M.D.   On: 10/07/2013 11:14   Dg Chest 2 View  10/08/2013   CLINICAL DATA:  Shortness of breath status post left-sided thoracentesis yesterday  EXAM: CHEST  2 VIEW  COMPARISON:  End expiratory post thoracentesis film of October 07, 2013  FINDINGS: There remains a small amount of pleural fluid on the left. There is a trace of pleural fluid on the right. There is no pneumothorax nor pneumomediastinum.  The right lung is adequately inflated and clear. The cardiac silhouette is top-normal in size. The central pulmonary vascularity is mildly prominent but is more distinct than on yesterday's study. The bony thorax is unremarkable.  IMPRESSION: There is no pneumothorax. There is a small amount of pleural fluid on the left but less than that seen on the pre thoracentesis study of October 06, 2013. There is a trace of pleural fluid on the right today which is new.   Electronically Signed   By: David  Martinique   On: 10/08/2013 18:11   Dg Chest 2 View  10/06/2013   CLINICAL DATA:  Chest pain/soreness  EXAM: CHEST  2 VIEW  COMPARISON:  None.  FINDINGS: Layering small to moderate left pleural effusion. Associated left lower lobe opacity, likely  atelectasis. No focal consolidation. No pneumothorax.  The heart is top-normal in size.  Mild degenerative changes of the visualized thoracolumbar spine.  IMPRESSION: Layering small to moderate left pleural effusion.   Electronically Signed   By: Julian Hy M.D.   On: 10/06/2013 20:35   Ct Angio Chest Pe W/cm &/or Wo Cm  10/06/2013   CLINICAL DATA:  Chest pain with difficulty breathing.  EXAM: CT ANGIOGRAPHY CHEST WITH CONTRAST  TECHNIQUE: Multidetector CT imaging of the chest was performed using the standard protocol during bolus administration of intravenous contrast. Multiplanar CT image reconstructions and MIPs were obtained to evaluate the vascular anatomy.  CONTRAST:  1106mL OMNIPAQUE IOHEXOL 350 MG/ML SOLN  COMPARISON:  PA and lateral chest x-ray of October 06, 2013  FINDINGS: There is a large left pleural effusion. There is parenchymal consolidation of much of the left lower lobe. The left upper lobe is adequately inflated. The right lung is well-expanded. There are mild emphysematous changes in the upper lobe.  Contrast within the pulmonary arterial tree is normal where visualized. Visualization of the peripheral pulmonary artery branches in the mid and lower left hemithorax  is limited. The caliber of the thoracic aorta is normal. The cardiac chambers are normal in size. There is no bulky hilar lymphadenopathy. There is a mildly enlarged subcarinal lymph node measuring 14 mm in short axis.  The bony thorax is unremarkable. Within the upper abdomen there are calcified gallstones. The observed portions of the liver and spleen are unremarkable.  Review of the MIP images confirms the above findings.  IMPRESSION: 1. There is no acute pulmonary embolism nor acute thoracic aortic pathology. 2. There is a large left pleural effusion with atelectasis of much of the left lower lobe. And obstructing mass is not clearly demonstrated. 3. There are mild emphysematous changes within the otherwise normal-appearing right lung. There is borderline to mild enlargement of right hilar and subcarinal lymph nodes.   Electronically Signed   By: David  Martinique   On: 10/06/2013 21:59   US Thoracentesis Asp Pleural Space W/img Guide  10/07/2013   CLINICAL DATA:  LEFT pleural effusion  EXAM: US THORACENTESIS ASP PLEURAL SPACE W/IMG GUIDE  : COMPARISON:  CT chest and chest radiograph of 10/06/2013  TECHNIQUE: Procedure, benefits, and risks of procedure were discussed with patient.  Written informed consent for procedure was obtained.  Time out protocol followed.  Pleural effusion localized at the posterior LEFT hemi thorax.  Skin prepped and draped in usual sterile fashion.  Skin and soft tissues anesthetized with 10 mL of 1% lidocaine.  8 French thoracentesis catheter placed into the LEFT pleural space.  1040 mL of yellow fluid aspirated by syringe pump.  Procedure tolerated well by patient without immediate complication.  180 mL of fluid was sent to laboratory for requested analysis.  IMPRESSION: Ultrasound-guided LEFT thoracentesis as above.   Electronically Signed   By: Lavonia Dana M.D.   On: 10/07/2013 11:10    Microbiology: Recent Results (from the past 240 hour(s))  BODY FLUID CULTURE     Status: None    Collection Time    10/07/13 11:00 AM      Result Value Ref Range Status   Specimen Description THORACENTESIS   Final   Special Requests THORACENTESIS   Final   Gram Stain     Final   Value: RARE WBC PRESENT,BOTH PMN AND MONONUCLEAR     NO ORGANISMS SEEN     Performed at [facility]  Culture     Final   Value: NO GROWTH 3 DAYS     Performed at [facility]   Report Status 10/10/2013 FINAL   Final  CULTURE, BLOOD (ROUTINE X 2)     Status: None   Collection Time    10/08/13  9:06 PM      Result Value Ref Range Status   Specimen Description BLOOD LEFT HAND   Final   Special Requests BOTTLES DRAWN AEROBIC AND ANAEROBIC 10CC   Final   Culture NO GROWTH 2 DAYS   Final   Report Status PENDING   Incomplete  CULTURE, BLOOD (ROUTINE X 2)     Status: None   Collection Time    10/08/13  9:08 PM      Result Value Ref Range Status   Specimen Description BLOOD RIGHT ARM   Final   Special Requests BOTTLES DRAWN AEROBIC AND ANAEROBIC 12CC   Final   Culture NO GROWTH 2 DAYS   Final   Report Status PENDING   Incomplete     Labs: Basic Metabolic Panel:  Recent Labs Lab 10/06/13 1917 10/06/13 2252 10/07/13 0550 10/08/13 0541 10/09/13 0554  NA 142  --  140 139 135*  K 4.3  --  4.4 4.2 4.1  CL 101  --  102 101 99  CO2 28  --  30 27 26   GLUCOSE 103*  --  104* 107* 134*  BUN 16  --  15 13 16   CREATININE 0.98 0.88 0.96 0.93 1.03  CALCIUM 9.3  --  9.0 8.9 8.9   Liver Function Tests:  Recent Labs Lab 10/06/13 1917 10/07/13 0550  AST 36 40*  ALT 53 54*  ALKPHOS 82 78  BILITOT 0.4 0.5  PROT 7.2 6.9  ALBUMIN 3.7 3.5   No results found for this basename: LIPASE, AMYLASE,  in the last 168 hours No results found for this basename: AMMONIA,  in the last 168 hours CBC:  Recent Labs Lab 10/06/13 1917 10/06/13 2252 10/07/13 0550 10/08/13 0541 10/09/13 0554  WBC 9.6 9.8 8.2 12.1* 13.2*  NEUTROABS 6.0  --   --   --   --   HGB 14.0 13.9 13.9 13.8 13.6  HCT 40.8  41.1 40.8 40.4 40.0  MCV 83.3 83.7 83.8 83.1 83.2  PLT 278 281 247 260 295   Cardiac Enzymes:  Recent Labs Lab 10/06/13 1942  TROPONINI <0.30   BNP: BNP (last 3 results)  Recent Labs  10/06/13 1942  PROBNP 79.8   CBG:  Recent Labs Lab 10/07/13 0812 10/08/13 0756 10/09/13 0725 10/10/13 0748 10/11/13 0725  GLUCAP 112* 114* 148* 101* 147*       Signed:  [name],[name]  [facility] 10/11/2013, 4:06 PM

## 2013-10-11 NOTE — Clinical Documentation Improvement (Signed)
  Please address abnormal of BMI 40.17 to reflect severity of illness and risk of mortality. Thank you.  Possible Clinical conditions - Morbid Obesity = BMI >/= 40 - Other condition   Thank You, Ezekiel Ina ,RN Clinical Documentation Specialist:  [facility]

## 2013-10-11 NOTE — Progress Notes (Signed)
Patient being d/c home with prescriptions and instructions. IV cath removed and intact. No c/o pain at this time. No pain/swelling at site.

## 2013-10-11 NOTE — Discharge Instructions (Signed)
Pleural Effusion The lining covering your lungs and the inside of your chest is called the pleura. Usually, the space between the 2 pleura contains no air and only a thin layer of fluid. A pleural effusion is an abnormal buildup of fluid in the pleural space. Fluid gathers when there is increased pressure in the lung vessels. This forces fluids out of the lungs and into the pleural space. Vessels may also leak fluids when there are infections, such as pneumonia, or other causes of soreness and redness (inflammation). Fluids leak into the lungs when protein in the blood is low or when certain vessels (lymphatics) are blocked. Finding a pleural effusion is important because it is usually caused by another disease. In order to treat a pleural effusion, your health care provider needs to find its cause. If left untreated, a large amount of fluid can build up and cause collapse of the lung. CAUSES   Heart failure.  Infections (pneumonia, tuberculosis), pulmonary embolism, pulmonary infarction.  Cancer (primary lung and metastatic), asbestosis.  Liver failure (cirrhosis).  Nephrotic syndrome, peritoneal dialysis, kidney problems (uremia).  Collagen vascular disease (systemic lupus erythematosis, rheumatoid arthritis).  Injury (trauma) to the chest or rupture of the digestive tube (esophagus).  Material in the chest or pleural space (hemothorax, chylothorax).  Pancreatitis.  Surgery.  Drug reactions. SYMPTOMS  A pleural effusion can decrease the amount of space available for breathing and make you short of breath. The fluid can become infected, which may cause pain and fever. Often, the pain is worse when taking a deep breath. The underlying disease (heart failure, pneumonia, blood clot, tuberculosis, cancer) may also cause symptoms. DIAGNOSIS   Your health care provider can usually tell what is wrong by talking to you (taking a history), doing an exam, and taking a routine X-ray. If the  X-ray shows fluid in your chest, often fluid is removed from your chest with a needle for testing (diagnostic thoracentesis).  Sometimes, more specialized X-rays may be needed.  Sometimes, a small piece of tissue is removed and examined by a specialist (biopsy). TREATMENT  Treatment varies based on what caused the pleural effusion. Treatments include:  Removing as much fluid as possible using a needle (thoracentesis) to improve the cough and shortness of breath. This is a simple procedure which can be done at bedside. The risks are bleeding, infection, collapse of a lung, or low blood pressure.  Placing a tube in the chest to drain the effusion (tube thoracostomy). This is often used when there is an infection in the fluid. This is a simple procedure which can often be done at bedside or in a clinic. The procedure may be painful. The risks are the same as using a needle to drain the fluid. The chest tube usually remains for a few days and is connected to suction to improve fluid drainage. The tube, after placement, usually does not cause much discomfort.  Surgical removal of fibrous debris in and around the pleural space (decortication). This may be done with a flexible telescope (thoracoscope) through a small or large cut (incision). This is helpful for patients who have fibrosis or scar tissue that prevents complete lung expansion. The risks are infection, blood loss, and side effects from general anesthesia.  Sometimes, a procedure called pleurodesis is done. A chest tube is placed and the fluid is drained. Next, an agent (tetracycline, talc powder) is added to the pleural space. This causes the lung and chest wall to stick together (adhesion). This leaves no  potential space for fluid to build up. The risks include infection, blood loss, and side effects from general anesthesia.  If the effusion is caused by infection, it may be treated with antibiotics and improve without draining. HOME CARE  INSTRUCTIONS   Take any medicines exactly as prescribed.  Follow up with your health care provider as directed.  Monitor your exercise capacity (the amount of walking you can do before you get short of breath).  Do not use any tobacco products including cigaretts, chewing tobacco, or electronic cigarettes. SEEK MEDICAL CARE IF:   Your exercise capacity seems to get worse or does not improve with time.  You do not recover from your illness.  You have drainage, redness, swelling, or pain at any incision or puncture sites. SEEK IMMEDIATE MEDICAL CARE IF:   Shortness of breath or chest pain develops or gets worse.  You have a fever.  You develop a new cough, especially if the mucus (phlegm) is discolored. MAKE SURE YOU:   Understand these instructions.  Will watch your condition.  Will get help right away if you are not doing well or get worse. Document Released: 03/25/2005 Document Revised: 03/30/2013 Document Reviewed: 11/14/2006 [facility] Patient Information 2015 [place], [place]. This information is not intended to replace advice given to you by your health care provider. Make sure you discuss any questions you have with your health care provider.

## 2013-10-11 NOTE — Progress Notes (Signed)
He says he feels much better. He's had much less fever. He has no other new complaints. He hopes to go home today  He is afebrile. His chest is clear. He looks comfortable  I think he had pneumonia. I don't think he needs another CT at this point. He will need followup x-ray and I will plan to see him in my office for followup. I will follow more peripherally at this point

## 2013-10-12 NOTE — Progress Notes (Signed)
UR chart review completed.  

## 2013-10-13 LAB — CULTURE, BLOOD (ROUTINE X 2)
Culture: NO GROWTH
Culture: NO GROWTH

## 2013-10-25 ENCOUNTER — Emergency Department (HOSPITAL_COMMUNITY): Payer: BC Managed Care – PPO

## 2013-10-25 ENCOUNTER — Encounter (HOSPITAL_COMMUNITY): Payer: Self-pay | Admitting: Emergency Medicine

## 2013-10-25 ENCOUNTER — Inpatient Hospital Stay (HOSPITAL_COMMUNITY)
Admission: EM | Admit: 2013-10-25 | Discharge: 2013-10-30 | DRG: 186 | Disposition: A | Discharge status: Home / Self Care | Payer: BC Managed Care – PPO | Attending: Family Medicine | Admitting: Family Medicine

## 2013-10-25 DIAGNOSIS — B998 Other infectious disease: Secondary | ICD-10-CM | POA: Diagnosis present

## 2013-10-25 DIAGNOSIS — J189 Pneumonia, unspecified organism: Secondary | ICD-10-CM | POA: Diagnosis present

## 2013-10-25 DIAGNOSIS — K8020 Calculus of gallbladder without cholecystitis without obstruction: Secondary | ICD-10-CM | POA: Diagnosis present

## 2013-10-25 DIAGNOSIS — R509 Fever, unspecified: Secondary | ICD-10-CM

## 2013-10-25 DIAGNOSIS — K219 Gastro-esophageal reflux disease without esophagitis: Secondary | ICD-10-CM | POA: Diagnosis present

## 2013-10-25 DIAGNOSIS — Z87891 Personal history of nicotine dependence: Secondary | ICD-10-CM

## 2013-10-25 DIAGNOSIS — B999 Unspecified infectious disease: Secondary | ICD-10-CM | POA: Diagnosis present

## 2013-10-25 DIAGNOSIS — J90 Pleural effusion, not elsewhere classified: Principal | ICD-10-CM | POA: Diagnosis present

## 2013-10-25 DIAGNOSIS — I10 Essential (primary) hypertension: Secondary | ICD-10-CM | POA: Diagnosis present

## 2013-10-25 DIAGNOSIS — K7689 Other specified diseases of liver: Secondary | ICD-10-CM | POA: Diagnosis present

## 2013-10-25 LAB — TROPONIN I: Troponin I: <0.3 ng/mL (ref <0.30)

## 2013-10-25 LAB — BASIC METABOLIC PANEL
Anion gap: 11 (ref 5–15)
BUN: 18 mg/dL (ref 6–23)
CO2: 27 mEq/L (ref 19–32)
Calcium: 8.9 mg/dL (ref 8.4–10.5)
Chloride: 101 mEq/L (ref 96–112)
Creatinine, Ser: 1.01 mg/dL (ref 0.50–1.35)
GFR calc Af Amer: >90 mL/min (ref >90)
GFR calc non Af Amer: 84 mL/min — ABNORMAL LOW (ref >90)
Glucose, Bld: 94 mg/dL (ref 70–99)
Potassium: 3.9 mEq/L (ref 3.7–5.3)
Sodium: 139 mEq/L (ref 137–147)

## 2013-10-25 LAB — CBC WITH DIFFERENTIAL/PLATELET
Basophils Absolute: 0 10*3/uL (ref 0.0–0.1)
Basophils Relative: 0 % (ref 0–1)
Eosinophils Absolute: 0.3 10*3/uL (ref 0.0–0.7)
Eosinophils Relative: 4 % (ref 0–5)
HCT: 36.7 % — ABNORMAL LOW (ref 39.0–52.0)
Hemoglobin: 12.4 g/dL — ABNORMAL LOW (ref 13.0–17.0)
Lymphocytes Relative: 23 % (ref 12–46)
Lymphs Abs: 2 10*3/uL (ref 0.7–4.0)
MCH: 28.2 pg (ref 26.0–34.0)
MCHC: 33.8 g/dL (ref 30.0–36.0)
MCV: 83.4 fL (ref 78.0–100.0)
Monocytes Absolute: 0.8 10*3/uL (ref 0.1–1.0)
Monocytes Relative: 9 % (ref 3–12)
Neutro Abs: 5.8 10*3/uL (ref 1.7–7.7)
Neutrophils Relative %: 64 % (ref 43–77)
Platelets: 266 10*3/uL (ref 150–400)
RBC: 4.4 MIL/uL (ref 4.22–5.81)
RDW: 13 % (ref 11.5–15.5)
WBC: 9 10*3/uL (ref 4.0–10.5)

## 2013-10-25 MED ORDER — IOHEXOL 350 MG/ML SOLN
100.0000 mL | Freq: Once | INTRAVENOUS | Status: AC | PRN
  Administered 2013-10-25: 100 mL via INTRAVENOUS

## 2013-10-25 NOTE — ED Notes (Signed)
SOB started on Saturday, progressively worsening. Reports SOB mainly with exertion. Nonproductive cough.

## 2013-10-25 NOTE — ED Notes (Signed)
Patient sent by [facility]. Per patient shortness of breath since Friday. Per [facility] patient. Was just discharged from here on 7/6 for LLL pleural effusion, in which he had 1 liter removed. Patient tachy and has low grade fever at [facility] chest x-ray "is not as bad as prior."

## 2013-10-26 ENCOUNTER — Inpatient Hospital Stay (HOSPITAL_COMMUNITY): Payer: BC Managed Care – PPO

## 2013-10-26 ENCOUNTER — Encounter (HOSPITAL_COMMUNITY): Payer: Self-pay | Admitting: Internal Medicine

## 2013-10-26 DIAGNOSIS — B998 Other infectious disease: Secondary | ICD-10-CM | POA: Diagnosis present

## 2013-10-26 DIAGNOSIS — K8020 Calculus of gallbladder without cholecystitis without obstruction: Secondary | ICD-10-CM | POA: Diagnosis present

## 2013-10-26 DIAGNOSIS — J90 Pleural effusion, not elsewhere classified: Principal | ICD-10-CM

## 2013-10-26 DIAGNOSIS — I10 Essential (primary) hypertension: Secondary | ICD-10-CM

## 2013-10-26 DIAGNOSIS — R509 Fever, unspecified: Secondary | ICD-10-CM

## 2013-10-26 DIAGNOSIS — J189 Pneumonia, unspecified organism: Secondary | ICD-10-CM

## 2013-10-26 DIAGNOSIS — Y95 Nosocomial condition: Secondary | ICD-10-CM | POA: Diagnosis present

## 2013-10-26 HISTORY — DX: Calculus of gallbladder without cholecystitis without obstruction: K80.20

## 2013-10-26 LAB — PROTEIN, BODY FLUID: Total protein, fluid: 5.4 g/dL

## 2013-10-26 LAB — GLUCOSE, SEROUS FLUID: Glucose, Fluid: 91 mg/dL

## 2013-10-26 LAB — BODY FLUID CELL COUNT WITH DIFFERENTIAL
Eos, Fluid: 6 %
Lymphs, Fluid: 15 %
Monocyte-Macrophage-Serous Fluid: 15 % — ABNORMAL LOW (ref 50–90)
Neutrophil Count, Fluid: 64 % — ABNORMAL HIGH (ref 0–25)
Total Nucleated Cell Count, Fluid: 3820 cu mm — ABNORMAL HIGH (ref 0–1000)

## 2013-10-26 LAB — LACTATE DEHYDROGENASE, PLEURAL OR PERITONEAL FLUID: LD, Fluid: 835 U/L — ABNORMAL HIGH (ref 3–23)

## 2013-10-26 LAB — HIV ANTIBODY (ROUTINE TESTING W REFLEX): HIV 1&2 Ab, 4th Generation: NONREACTIVE

## 2013-10-26 MED ORDER — PIPERACILLIN-TAZOBACTAM 3.375 G IVPB
INTRAVENOUS | Status: AC
  Filled 2013-10-26: qty 50

## 2013-10-26 MED ORDER — OXYCODONE HCL 5 MG PO TABS
5.0000 mg | ORAL_TABLET | Freq: Once | ORAL | Status: AC
  Administered 2013-10-26: 5 mg via ORAL
  Filled 2013-10-26: qty 1

## 2013-10-26 MED ORDER — VANCOMYCIN HCL IN DEXTROSE 1-5 GM/200ML-% IV SOLN
1000.0000 mg | Freq: Once | INTRAVENOUS | Status: AC
  Administered 2013-10-26: 1000 mg via INTRAVENOUS
  Filled 2013-10-26: qty 200

## 2013-10-26 MED ORDER — ROPINIROLE HCL 0.25 MG PO TABS
0.2500 mg | ORAL_TABLET | Freq: Every day | ORAL | Status: DC
  Administered 2013-10-26 – 2013-10-29 (×5): 0.25 mg via ORAL
  Filled 2013-10-26 (×7): qty 1

## 2013-10-26 MED ORDER — ACETAMINOPHEN 325 MG PO TABS
650.0000 mg | ORAL_TABLET | Freq: Four times a day (QID) | ORAL | Status: DC | PRN
  Administered 2013-10-26 – 2013-10-29 (×5): 650 mg via ORAL
  Filled 2013-10-26 (×5): qty 2

## 2013-10-26 MED ORDER — OXYCODONE HCL 5 MG PO TABS
5.0000 mg | ORAL_TABLET | ORAL | Status: DC | PRN
  Administered 2013-10-26 – 2013-10-30 (×18): 5 mg via ORAL
  Filled 2013-10-26 (×18): qty 1

## 2013-10-26 MED ORDER — PIPERACILLIN-TAZOBACTAM 3.375 G IVPB 30 MIN: 3.3750 g | Freq: Once | INTRAVENOUS | Status: DC

## 2013-10-26 MED ORDER — VANCOMYCIN HCL IN DEXTROSE 1-5 GM/200ML-% IV SOLN
1000.0000 mg | Freq: Three times a day (TID) | INTRAVENOUS | Status: DC
  Administered 2013-10-26 – 2013-10-30 (×12): 1000 mg via INTRAVENOUS
  Filled 2013-10-26 (×19): qty 200

## 2013-10-26 MED ORDER — DEXTROSE 5 % IV SOLN
INTRAVENOUS | Status: AC
  Filled 2013-10-26: qty 1

## 2013-10-26 MED ORDER — DEXTROSE 5 % IV SOLN
1.0000 g | Freq: Three times a day (TID) | INTRAVENOUS | Status: DC
  Filled 2013-10-26 (×2): qty 1

## 2013-10-26 MED ORDER — MORPHINE SULFATE 2 MG/ML IJ SOLN
1.0000 mg | INTRAMUSCULAR | Status: DC | PRN
  Administered 2013-10-26 – 2013-10-27 (×3): 1 mg via INTRAVENOUS
  Filled 2013-10-26 (×4): qty 1

## 2013-10-26 MED ORDER — ALBUTEROL SULFATE (2.5 MG/3ML) 0.083% IN NEBU: 2.5000 mg | INHALATION_SOLUTION | RESPIRATORY_TRACT | Status: DC | PRN

## 2013-10-26 MED ORDER — LOSARTAN POTASSIUM 50 MG PO TABS
50.0000 mg | ORAL_TABLET | Freq: Every day | ORAL | Status: DC
  Administered 2013-10-26 – 2013-10-30 (×5): 50 mg via ORAL
  Filled 2013-10-26 (×5): qty 1

## 2013-10-26 MED ORDER — ACETAMINOPHEN 650 MG RE SUPP: 650.0000 mg | Freq: Four times a day (QID) | RECTAL | Status: DC | PRN

## 2013-10-26 MED ORDER — SODIUM CHLORIDE 0.9 % IV SOLN
INTRAVENOUS | Status: AC
  Administered 2013-10-26: 02:00:00 via INTRAVENOUS

## 2013-10-26 MED ORDER — CEFEPIME HCL 2 G IJ SOLR
2.0000 g | Freq: Two times a day (BID) | INTRAMUSCULAR | Status: DC
  Administered 2013-10-26 – 2013-10-30 (×10): 2 g via INTRAVENOUS
  Filled 2013-10-26 (×14): qty 2

## 2013-10-26 MED ORDER — PANTOPRAZOLE SODIUM 40 MG PO TBEC
40.0000 mg | DELAYED_RELEASE_TABLET | Freq: Every day | ORAL | Status: DC
  Administered 2013-10-26 – 2013-10-30 (×5): 40 mg via ORAL
  Filled 2013-10-26 (×5): qty 1

## 2013-10-26 MED ORDER — BUDESONIDE 0.25 MG/2ML IN SUSP
0.2500 mg | Freq: Two times a day (BID) | RESPIRATORY_TRACT | Status: DC
  Filled 2013-10-26 (×3): qty 2

## 2013-10-26 MED ORDER — ONDANSETRON HCL 4 MG PO TABS: 4.0000 mg | ORAL_TABLET | Freq: Four times a day (QID) | ORAL | Status: DC | PRN

## 2013-10-26 MED ORDER — ONDANSETRON HCL 4 MG/2ML IJ SOLN: 4.0000 mg | Freq: Four times a day (QID) | INTRAMUSCULAR | Status: DC | PRN

## 2013-10-26 MED ORDER — DEXTROSE 5 % IV SOLN
INTRAVENOUS | Status: AC
  Filled 2013-10-26: qty 2

## 2013-10-26 NOTE — Progress Notes (Signed)
Thoracentesis complete no signs of distress. 1300 ml yellow pleural fluid removed

## 2013-10-26 NOTE — Progress Notes (Signed)
10/26/13 late entry for 1610 Patient c/o "rib pain" to left side on return from thoracentesis this afternoon. Oxycodone 5 mg po given as ordered PRN pain. Pt stated "it hurt, the last one didn't hurt like this". Lungs sounds clear bilaterally, no audible wheezing noted. Pt denies shortness of breath. Notified Dr. Luan Pulling. Nursing to monitor. Donavan Foil, RN

## 2013-10-26 NOTE — Consult Note (Signed)
More extensive note to follow. He has a reaccumulation of left pleural effusion. He also has  healthcare associated pneumonia. I am going to discuss his situation with thoracic surgery and see if they think that's procedure would be of any help at this time. If not he needs repeat diagnostic/therapeutic thoracentesis

## 2013-10-26 NOTE — H&P (Signed)
[facility] History and Physical  Colin Lee GGE:[id number] DOB: Aug 24, 1960 DOA: 10/25/2013   PCP: Bronson Curb, PA-C  Specialists: Has seen Dr. Luan Pulling when he was last hospitalized.  Chief Complaint: Fever, shortness of breath  HPI: Colin Lee is a 53 y.o. male with a past medical history of hypertension, who was recently hospitalized earlier this month for pleural effusion. He underwent thoracentesis. Cultures were unremarkable. He was discharged on Levaquin. He finished the course of his antibiotics this past Thursday and then on Saturday he started developing shortness of breath. Started having fever and chills. Denies any cough. Has noticed sharp pain in the left side of his chest especially with deep breathing. No nausea, vomiting, or diarrhea. Has had dizziness, but denies any syncopal episode. He has had weakness. Denies any loss of appetite. Denies any sick contacts or travel recently. Because the symptoms were getting worse he decided to come in to the hospital.  Home Medications: Prior to Admission medications   Medication Sig Start Date End Date Taking? Authorizing Provider  ibuprofen (ADVIL,MOTRIN) 600 MG tablet Take 600 mg by mouth every 6 (six) hours as needed. pain   Yes Historical Provider, MD  losartan (COZAAR) 50 MG tablet Take 50 mg by mouth daily.   Yes Historical Provider, MD  omeprazole (PRILOSEC) 20 MG capsule Take 20 mg by mouth daily.   Yes Historical Provider, MD  rOPINIRole (REQUIP) 0.25 MG tablet Take 1 tablet (0.25 mg total) by mouth at bedtime. 10/11/13  Yes Kathie Dike, MD  levofloxacin (LEVAQUIN) 750 MG tablet Take 1 tablet (750 mg total) by mouth daily. 10/11/13   Kathie Dike, MD    Allergies: No Known Allergies  Past Medical History: Past Medical History  Diagnosis Date  . GERD (gastroesophageal reflux disease)   . Hypertension   . Shortness of breath     Starting May 2015    Past Surgical History  Procedure  Laterality Date  . No prior surgery    . Thoracentesis Left 2015    Social History: He lives in [place] with his wife. Denies smoking, alcohol use or illicit drug use. Quit in 2002. Independent with daily activities  Family History:  Family History  Problem Relation Age of Onset  . Adopted: Yes     Review of Systems - History obtained from the patient General ROS: positive for  - fatigue Psychological ROS: negative Ophthalmic ROS: negative ENT ROS: negative Allergy and Immunology ROS: negative Hematological and Lymphatic ROS: negative Endocrine ROS: negative Respiratory ROS: as in hpi Cardiovascular ROS: as in hpi Gastrointestinal ROS: no abdominal pain, change in bowel habits, or black or bloody stools Genito-Urinary ROS: no dysuria, trouble voiding, or hematuria Musculoskeletal ROS: negative Neurological ROS: no TIA or stroke symptoms Dermatological ROS: negative  Physical Examination  Filed Vitals:   10/25/13 2330 10/26/13 0000 10/26/13 0030 10/26/13 0110  BP: 141/89 121/79 141/93 160/85  Pulse: 95 90 91 94  Temp:    99.7 F (37.6 C)  TempSrc:    Oral  Resp: _0 Height:    _1  (1.778 m)  Weight:    124.2 kg (273 lb 13 oz)  SpO2: 92% 96% 94% 97%    BP 160/85  Pulse 94  Temp(Src) 99.7 F (37.6 C) (Oral)  Resp 20  Ht _2  (1.778 m)  Wt 124.2 kg (273 lb 13 oz)  BMI 39.29 kg/m2  SpO2 97%  General appearance: alert, cooperative, appears stated age and  no distress Head: Normocephalic, without obvious abnormality, atraumatic Eyes: conjunctivae/corneas clear. PERRL, EOM's intact.  Throat: lips, mucosa, and tongue normal; teeth and gums normal Neck: no adenopathy, no carotid bruit, no JVD, supple, symmetrical, trachea midline and thyroid not enlarged, symmetric, no tenderness/mass/nodules Resp: Decreased air entry on the left with a few crackles. Dullness to percussion at left lower lung field Cardio: regular rate and rhythm, S1, S2 normal, no  murmur, click, rub or gallop GI: soft, non-tender; bowel sounds normal; no masses,  no organomegaly Extremities: extremities normal, atraumatic, no cyanosis or edema Pulses: 2+ and symmetric Skin: Skin color, texture, turgor normal. No rashes or lesions Lymph nodes: Cervical, supraclavicular, and axillary nodes normal. Neurologic: No focal neurological deficits  Laboratory Data: Results for orders placed during the hospital encounter of 10/25/13 (from the past 48 hour(s))  CBC WITH DIFFERENTIAL     Status: Abnormal   Collection Time    10/25/13  8:06 PM      Result Value Ref Range   WBC 9.0  4.0 - 10.5 K/uL   RBC 4.40  4.22 - 5.81 MIL/uL   Hemoglobin 12.4 (*) 13.0 - 17.0 g/dL   HCT 36.7 (*) 39.0 - 52.0 %   MCV 83.4  78.0 - 100.0 fL   MCH 28.2  26.0 - 34.0 pg   MCHC 33.8  30.0 - 36.0 g/dL   RDW 13.0  11.5 - 15.5 %   Platelets 266  150 - 400 K/uL   Neutrophils Relative % 64  43 - 77 %   Neutro Abs 5.8  1.7 - 7.7 K/uL   Lymphocytes Relative 23  12 - 46 %   Lymphs Abs 2.0  0.7 - 4.0 K/uL   Monocytes Relative 9  3 - 12 %   Monocytes Absolute 0.8  0.1 - 1.0 K/uL   Eosinophils Relative 4  0 - 5 %   Eosinophils Absolute 0.3  0.0 - 0.7 K/uL   Basophils Relative 0  0 - 1 %   Basophils Absolute 0.0  0.0 - 0.1 K/uL  BASIC METABOLIC PANEL     Status: Abnormal   Collection Time    10/25/13  8:06 PM      Result Value Ref Range   Sodium 139  137 - 147 mEq/L   Potassium 3.9  3.7 - 5.3 mEq/L   Chloride 101  96 - 112 mEq/L   CO2 27  19 - 32 mEq/L   Glucose, Bld 94  70 - 99 mg/dL   BUN 18  6 - 23 mg/dL   Creatinine, Ser 1.01  0.50 - 1.35 mg/dL   Calcium 8.9  8.4 - 10.5 mg/dL   GFR calc non Af Amer 84 (*) >90 mL/min   GFR calc Af Amer >90  >90 mL/min   Comment: (NOTE)     The eGFR has been calculated using the CKD EPI equation.     This calculation has not been validated in all clinical situations.     eGFR's persistently <90 mL/min signify possible Chronic Kidney     Disease.   Anion  gap 11  5 - 15  TROPONIN I     Status: None   Collection Time    10/25/13  8:06 PM      Result Value Ref Range   Troponin I <0.30  <0.30 ng/mL   Comment:            Due to the release kinetics of cTnI,  a negative result within the first hours     of the onset of symptoms does not rule out     myocardial infarction with certainty.     If myocardial infarction is still suspected,     repeat the test at appropriate intervals.    Radiology Reports: Dg Chest 2 View  10/25/2013   CLINICAL DATA:  Chest discomfort  EXAM: CHEST  2 VIEW  COMPARISON:  Chest radiograph 10/08/2013  FINDINGS: Normal cardiac silhouette. There is bibasilar atelectasis similar prior. There is a trace small effusion which is also similar prior. No pulmonary edema. No pneumothorax.  IMPRESSION: No clear acute findings.  Bibasilar atelectasis and small left effusion.   Electronically Signed   By: Suzy Bouchard M.D.   On: 10/25/2013 19:24   Ct Angio Chest W/cm &/or Wo Cm  10/25/2013   CLINICAL DATA:  Shortness of breath with left pleural effusion. Question pulmonary embolism.  EXAM: CT ANGIOGRAPHY CHEST WITH CONTRAST  TECHNIQUE: Multidetector CT imaging of the chest was performed using the standard protocol during bolus administration of intravenous contrast. Multiplanar CT image reconstructions and MIPs were obtained to evaluate the vascular anatomy.  CONTRAST:  175m OMNIPAQUE IOHEXOL 350 MG/ML SOLN  COMPARISON:  Radiographs 10/25/2013.  CT 10/06/2013.  FINDINGS: Vascular: The pulmonary arteries are well opacified with contrast. There is no evidence of acute pulmonary embolism. There is stable mild aortic atherosclerosis.  Mediastinum: Small mediastinal and hilar lymph nodes are stable, not pathologically enlarged. The thyroid gland, trachea and esophagus appear normal. The heart size is normal.  Lungs/Pleura: There is a persistent large left pleural effusion which is similar to the recent studies. There is no significant  right pleural or pericardial effusion. Left lower lobe atelectasis appears mildly improved. There is no evidence of endobronchial lesion. In the right lung, there is new opacification of previously demonstrated right upper lobe paraseptal blebs medially. There is mild adjacent right upper lobe airspace disease.  Upper abdomen: Stable hepatic steatosis, cysts in the dome of the right hepatic lobe and multiple calcified gallstones. No evidence of adrenal mass.  Musculoskeletal/Chest wall: No chest wall lesion or acute osseous findings.  Review of the MIP images confirms the above findings.  IMPRESSION: 1. No evidence of acute pulmonary embolism. 2. Persistent large left pleural effusion. Patient underwent thoracentesis 10/07/2013. Correlation with prior thoracentesis results recommended. 3. New opacification of previously demonstrated paraseptal blebs medially in the right upper lobe. Given the recent change, this is presumably secondary to superimposed infection or hemorrhage. There is some adjacent right upper lobe airspace disease. 4. Stable appearance of the upper abdomen with hepatic steatosis and cholelithiasis.   Electronically Signed   By: BCamie PatienceM.D.   On: 10/25/2013 23:56    Electrocardiogram: Sinus tachycardia 108 beats per minute. Normal axis. Intervals are normal. No Q waves. No concerning ST or T-wave changes are noted.  Problem List  Principal Problem:   Pleural effusion Active Problems:   Hypertension   Fever   Healthcare-associated infection   HCAP (healthcare-associated pneumonia)   Assessment: This is a 53year old, Caucasian male, presents with shortness of breath, fever, and chills. He has reaccumulated his left-sided pleural effusion. He also has evidence for pneumonia. He has healthcare associated pneumonia at this time. Reason for his pleural effusion is not entirely clear but is most likely parapneumonic. Cytology report from 10/07/13 showed chronic inflammation, and  mesothelial cells.  Plan: #1 healthcare associated pneumonia with recurrence of pleural effusion: We'll place him on  broad-spectrum antibiotics including vancomycin and cefepime. Pulmonology will be reconsulted. May need to repeat a thoracentesis, but will defer to pulmonology in this matter. The pleuritic chest pain is due to the pleural effusion. EKG was nonischemic.  #2 history of hypertension: Continue with his oral agents.  #3 incidental cholelithiasis, and hepatic steatosis on CT scan: He did have mildly abnormal LFTs during previous hospitalization. We will repeat LFT during this hospitalization. This can be further pursued by his PCP. We'll also check hepatitis panel.   DVT Prophylaxis: SCDs only for now Code Status: Full code Family Communication: Discussed with the patient  Disposition Plan: Admit to MedSurg   Further management decisions will depend on results of further testing and patient's response to treatment.   [facility]  [facility] Pager [phone]  If 7PM-7AM, please contact night-coverage [email or web address] Password [email or web address]  10/26/2013, 1:13 AM  Disclaimer: This note was dictated with voice recognition software. Similar sounding words can inadvertently be transcribed and may not be corrected upon review.

## 2013-10-26 NOTE — Progress Notes (Addendum)
Patient seen and examined. Admitted after midnight secondary to SOB, pleuritic CP and fever/chills. Patient recently hospitalized for PNA and pleural effusion. CT angio demonstrated large reacumulation left pleural effusion and no PE. Patient CXR also demonstrating superimposed infiltrates suggesting HCAP.  For further info/details regarding admissions please referred to H&P written by Dr. Maryland Pink.  Plan: -continue broad spectrum antibiotics -will start pulmicort BID and flutter valve -follow pulmonologist recommendations (especially for decision regarding thoracentesis vs pleural decortication by thoracic surgeons) -follow clinical response and adjust treatment as needed.   Colin Lee [phone]

## 2013-10-26 NOTE — Progress Notes (Signed)
10/26/13 1732 Patient c/o persistent pain to left rib cage, requested something else for pain if possible. Notified Dr Legrand Rams. Order received for oxycodone 5 mg po x 1. Nursing to monitor. Donavan Foil, RN

## 2013-10-26 NOTE — ED Provider Notes (Signed)
CSN: [id number]     Arrival date & time 10/25/13  1858 History   First MD Initiated Contact with Patient 10/25/13 2137     Chief Complaint  Patient presents with  . Shortness of Breath     (Consider location/radiation/quality/duration/timing/severity/associated sxs/prior Treatment) HPI  This is a 53 -year-old male with a recent history of symptomatic left-sided pleural effusion requiring admission and drainage who presents with shortness of breath. Patient reports increasing shortness of breath over the weekend. Was discharged in early July on Levaquin. He is due to followup with pulmonology this week. He has finished his course of Levaquin. He states that he felt well until Saturday when he had increasing dyspnea on exertion and fevers to 100.9. Denies any significant cough. Denies any chest pain.  Noted to be febrile to 100.3. Review of patient's chart reveals thoracentesis with greater than 1 L of fluid removed on July 2. Cultures were negative.  Past Medical History  Diagnosis Date  . GERD (gastroesophageal reflux disease)   . Hypertension   . Shortness of breath     Starting May 2015   Past Surgical History  Procedure Laterality Date  . No prior surgery     Family History  Problem Relation Age of Onset  . Adopted: Yes   History  Substance Use Topics  . Smoking status: Former Smoker -- 1.00 packs/day for 30 years    Types: Cigarettes    Quit date: 04/08/2000  . Smokeless tobacco: Never Used  . Alcohol Use: Yes     Comment: rare    Review of Systems  Constitutional: Positive for fever.  Respiratory: Positive for shortness of breath. Negative for cough and chest tightness.   Cardiovascular: Negative.  Negative for chest pain and leg swelling.  Gastrointestinal: Negative.  Negative for abdominal pain.  Genitourinary: Negative.  Negative for dysuria.  Musculoskeletal: Negative for back pain.  Skin: Negative for rash.  Neurological: Negative for headaches.  All other  systems reviewed and are negative.     Allergies  Review of patient's allergies indicates no known allergies.  Home Medications   Prior to Admission medications   Medication Sig Start Date End Date Taking? Authorizing Provider  ibuprofen (ADVIL,MOTRIN) 600 MG tablet Take 600 mg by mouth every 6 (six) hours as needed. pain   Yes Historical Provider, MD  losartan (COZAAR) 50 MG tablet Take 50 mg by mouth daily.   Yes Historical Provider, MD  omeprazole (PRILOSEC) 20 MG capsule Take 20 mg by mouth daily.   Yes Historical Provider, MD  rOPINIRole (REQUIP) 0.25 MG tablet Take 1 tablet (0.25 mg total) by mouth at bedtime. 10/11/13  Yes Kathie Dike, MD  levofloxacin (LEVAQUIN) 750 MG tablet Take 1 tablet (750 mg total) by mouth daily. 10/11/13   Kathie Dike, MD   BP 144/96  Pulse 98  Temp(Src) 100.3 F (37.9 C) (Oral)  Resp 22  Ht 5\' 10"  (1.778 m)  Wt 277 lb (125.646 kg)  BMI 39.75 kg/m2  SpO2 95% Physical Exam  Nursing note and vitals reviewed. Constitutional: He is oriented to person, place, and time. He appears well-developed and well-nourished.  overweight  HENT:  Head: Normocephalic and atraumatic.  Eyes: Pupils are equal, round, and reactive to light.  Neck: Neck supple.  Cardiovascular: Regular rhythm and normal heart sounds.   No murmur heard. tachycardia  Pulmonary/Chest: Effort normal. No respiratory distress. He has no wheezes.  Diminished BS LLL  Abdominal: Soft. Bowel sounds are normal. There is no  tenderness. There is no rebound.  Musculoskeletal: He exhibits edema.  Lymphadenopathy:    He has no cervical adenopathy.  Neurological: He is alert and oriented to person, place, and time.  Skin: Skin is warm and dry.  Psychiatric: He has a normal mood and affect.    ED Course  Procedures (including critical care time) Labs Review Labs Reviewed  CBC WITH DIFFERENTIAL - Abnormal; Notable for the following:    Hemoglobin 12.4 (*)    HCT 36.7 (*)    All other  components within normal limits  BASIC METABOLIC PANEL - Abnormal; Notable for the following:    GFR calc non Af Amer 84 (*)    All other components within normal limits  CULTURE, BLOOD (ROUTINE X 2)  CULTURE, BLOOD (ROUTINE X 2)  TROPONIN I    Imaging Review Dg Chest 2 View  10/25/2013   CLINICAL DATA:  Chest discomfort  EXAM: CHEST  2 VIEW  COMPARISON:  Chest radiograph 10/08/2013  FINDINGS: Normal cardiac silhouette. There is bibasilar atelectasis similar prior. There is a trace small effusion which is also similar prior. No pulmonary edema. No pneumothorax.  IMPRESSION: No clear acute findings.  Bibasilar atelectasis and small left effusion.   Electronically Signed   By: Suzy Bouchard M.D.   On: 10/25/2013 19:24   Ct Angio Chest W/cm &/or Wo Cm  10/25/2013   CLINICAL DATA:  Shortness of breath with left pleural effusion. Question pulmonary embolism.  EXAM: CT ANGIOGRAPHY CHEST WITH CONTRAST  TECHNIQUE: Multidetector CT imaging of the chest was performed using the standard protocol during bolus administration of intravenous contrast. Multiplanar CT image reconstructions and MIPs were obtained to evaluate the vascular anatomy.  CONTRAST:  142mL OMNIPAQUE IOHEXOL 350 MG/ML SOLN  COMPARISON:  Radiographs 10/25/2013.  CT 10/06/2013.  FINDINGS: Vascular: The pulmonary arteries are well opacified with contrast. There is no evidence of acute pulmonary embolism. There is stable mild aortic atherosclerosis.  Mediastinum: Small mediastinal and hilar lymph nodes are stable, not pathologically enlarged. The thyroid gland, trachea and esophagus appear normal. The heart size is normal.  Lungs/Pleura: There is a persistent large left pleural effusion which is similar to the recent studies. There is no significant right pleural or pericardial effusion. Left lower lobe atelectasis appears mildly improved. There is no evidence of endobronchial lesion. In the right lung, there is new opacification of previously  demonstrated right upper lobe paraseptal blebs medially. There is mild adjacent right upper lobe airspace disease.  Upper abdomen: Stable hepatic steatosis, cysts in the dome of the right hepatic lobe and multiple calcified gallstones. No evidence of adrenal mass.  Musculoskeletal/Chest wall: No chest wall lesion or acute osseous findings.  Review of the MIP images confirms the above findings.  IMPRESSION: 1. No evidence of acute pulmonary embolism. 2. Persistent large left pleural effusion. Patient underwent thoracentesis 10/07/2013. Correlation with prior thoracentesis results recommended. 3. New opacification of previously demonstrated paraseptal blebs medially in the right upper lobe. Given the recent change, this is presumably secondary to superimposed infection or hemorrhage. There is some adjacent right upper lobe airspace disease. 4. Stable appearance of the upper abdomen with hepatic steatosis and cholelithiasis.   Electronically Signed   By: Camie Patience M.D.   On: 10/25/2013 23:56     EKG Interpretation   Date/Time:  Monday October 25 2013 19:10:59 EDT Ventricular Rate:  108 PR Interval:  136 QRS Duration: 100 QT Interval:  322 QTC Calculation: 431 R Axis:   40 Text  Interpretation:  Sinus tachycardia Otherwise normal ECG Confirmed by  Domonique Brouillard  MD, Grier Vu ([id number]) on 10/25/2013 9:34:28 PM      MDM   Final diagnoses:  Pleural effusion  Fever, unspecified fever cause    Patient presents with fever and SOB.  Recent admission for L pleural effusion requiring drainage.  Nontoxic on exam.  Temp 100.3.  INitial w/u including chest xray reassuring; however, given persistence of symptoms would be concerned for reaccumulating effusion.  CT shows large L lung effusion which appears to have reaccumulated given that patient had >1L removed after prior CT.  Given symptoms and fever, will given Vanc and Zosyn and admit for further management.    Merryl Hacker, MD 10/27/13 [phone]

## 2013-10-26 NOTE — ED Notes (Signed)
phelbotomist have drawn blood cultures

## 2013-10-26 NOTE — Progress Notes (Signed)
ANTIBIOTIC CONSULT NOTE - INITIAL  Pharmacy Consult for vancomycin & cefepime Indication: HCAP   No Known Allergies  Patient Measurements: Height: 5\' 10"  (177.8 cm) Weight: 273 lb 13 oz (124.2 kg) IBW/kg (Calculated) : 73 Adjusted Body Weight: 90kg  Vital Signs: Temp: 99.7 F (37.6 C) (07/21 0110) Temp src: Oral (07/21 0110) BP: 160/85 mmHg (07/21 0110) Pulse Rate: 94 (07/21 0110) Intake/Output from previous day:   Intake/Output from this shift:    Labs:  Recent Labs  10/25/13 2006  WBC 9.0  HGB 12.4*  PLT 266  CREATININE 1.01   Estimated Creatinine Clearance: 114.4 ml/min (by C-G formula based on Cr of 1.01). No results found for this basename: VANCOTROUGH, Corlis Leak, VANCORANDOM, GENTTROUGH, GENTPEAK, GENTRANDOM, TOBRATROUGH, TOBRAPEAK, TOBRARND, AMIKACINPEAK, AMIKACINTROU, AMIKACIN,  in the last 72 hours   Microbiology: Recent Results (from the past 720 hour(s))  BODY FLUID CULTURE     Status: None   Collection Time    10/07/13 11:00 AM      Result Value Ref Range Status   Specimen Description THORACENTESIS   Final   Special Requests THORACENTESIS   Final   Gram Stain     Final   Value: RARE WBC PRESENT,BOTH PMN AND MONONUCLEAR     NO ORGANISMS SEEN     Performed at [facility]   Culture     Final   Value: NO GROWTH 3 DAYS     Performed at [facility]   Report Status 10/10/2013 FINAL   Final  CULTURE, BLOOD (ROUTINE X 2)     Status: None   Collection Time    10/08/13  9:06 PM      Result Value Ref Range Status   Specimen Description BLOOD LEFT HAND   Final   Special Requests BOTTLES DRAWN AEROBIC AND ANAEROBIC 10CC   Final   Culture NO GROWTH 5 DAYS   Final   Report Status 10/13/2013 FINAL   Final  CULTURE, BLOOD (ROUTINE X 2)     Status: None   Collection Time    10/08/13  9:08 PM      Result Value Ref Range Status   Specimen Description BLOOD RIGHT ARM   Final   Special Requests BOTTLES DRAWN AEROBIC AND ANAEROBIC 12CC   Final    Culture NO GROWTH 5 DAYS   Final   Report Status 10/13/2013 FINAL   Final  CULTURE, BLOOD (ROUTINE X 2)     Status: None   Collection Time    10/26/13 12:36 AM      Result Value Ref Range Status   Specimen Description Blood BLOOD LEFT HAND   Final   Special Requests BOTTLES DRAWN AEROBIC AND ANAEROBIC [place]   Final   Culture PENDING   Incomplete   Report Status PENDING   Incomplete  CULTURE, BLOOD (ROUTINE X 2)     Status: None   Collection Time    10/26/13 12:36 AM      Result Value Ref Range Status   Specimen Description Blood BLOOD RIGHT HAND   Final   Special Requests BOTTLES DRAWN AEROBIC AND ANAEROBIC [facility]   Final   Culture PENDING   Incomplete   Report Status PENDING   Incomplete    Medical History: Past Medical History  Diagnosis Date  . GERD (gastroesophageal reflux disease)   . Hypertension   . Shortness of breath     Starting May 2015    Medications:  Scheduled:  . ceFEPime (MAXIPIME) IV  1 g Intravenous Q8H  . losartan  50 mg Oral Daily  . pantoprazole  40 mg Oral Daily  . piperacillin-tazobactam  3.375 g Intravenous Once  . rOPINIRole  0.25 mg Oral QHS  . vancomycin  1,000 mg Intravenous Once   Infusions:  . sodium chloride     PRN: acetaminophen, acetaminophen, albuterol, ondansetron (ZOFRAN) IV, ondansetron, oxyCODONE  Assessment: 48yr large male S/P thoracentesis for pulmonary effusion 2 wk's ago (completing 10-14day course of Levaquin), now with persistant left pleural effusion, fever & chills - R/O pneumonia/pleural infection.  To be treated with cefepime and vancomycin.  Goal of Therapy:  Standard regimen cefepime for pneumonia is 1-2 gm IV q8h.  Will adjust dose to 2gm to be more aggressive in large male with excellent renal function.  Patient already has received 1gm Vancomycin.  Desire vancomycin trough to be 15-48mcg/ml.  Plan:  1.  Increase cefepime regimen to 2gm IV q8h 2.  DC Zosyn dose 3. Start vancomycin regimen 1gm IV q8h  Danie Binder E 10/26/2013,1:38 AM

## 2013-10-26 NOTE — Care Management (Signed)
InterQual Version:  InterQual 2014 Review date:  10-26-2013 Review Status:  In Primary Product:  [id number] Adult Criteria subset:  General Medical Criteria status:  Criteria Not Met         Respiratory, >= One: [See notes 1.1 - 1.2]           Pleural effusion or spontaneous pneumothorax > 3 cm by           imaging, One: [See note 2]           Pulmonary infiltrate by imaging, Both: [See note 3]   INTERQUAL REVIEW NOTES (Includes notes for NON-SELECTED items):   Note 1.1 [ITEM NOT SELECTED: Respiratory, >= One:]: vanc and maxepime per pharmacy for HCAP Note 1.2 [ITEM NOT SELECTED: Respiratory, >= One:]: Per PA second level review- IP appropriate Note 2 [ITEM NOT SELECTED: Pleural effusion or spontaneous pneumothorax > 3 cm by imaging, One:]: Reaccumulation of fluid s/p thoracentesis 7/2. HCAP. TCTS consult pending. If their intervention is not necessary, repeat diagnostic/therapeutic thoracentesis. Note 3 [ITEM NOT SELECTED: Pulmonary infiltrate by imaging, Both:]: New opacification of previously demonstrated paraseptal blebs medially in the right upper lobe. Given the recent change, this is presumably secondary to superimposed infection or hemorrhage. There is some adjacent right upper lobe airspace disease.

## 2013-10-27 LAB — COMPREHENSIVE METABOLIC PANEL
ALT: 29 U/L (ref 0–53)
AST: 20 U/L (ref 0–37)
Albumin: 2.9 g/dL — ABNORMAL LOW (ref 3.5–5.2)
Alkaline Phosphatase: 65 U/L (ref 39–117)
Anion gap: 11 (ref 5–15)
BUN: 14 mg/dL (ref 6–23)
CO2: 27 mEq/L (ref 19–32)
Calcium: 8.6 mg/dL (ref 8.4–10.5)
Chloride: 101 mEq/L (ref 96–112)
Creatinine, Ser: 0.88 mg/dL (ref 0.50–1.35)
GFR calc Af Amer: >90 mL/min (ref >90)
GFR calc non Af Amer: >90 mL/min (ref >90)
Glucose, Bld: 111 mg/dL — ABNORMAL HIGH (ref 70–99)
Potassium: 4.1 mEq/L (ref 3.7–5.3)
Sodium: 139 mEq/L (ref 137–147)
Total Bilirubin: 0.4 mg/dL (ref 0.3–1.2)
Total Protein: 6.6 g/dL (ref 6.0–8.3)

## 2013-10-27 LAB — CBC
HCT: 36.7 % — ABNORMAL LOW (ref 39.0–52.0)
Hemoglobin: 12.4 g/dL — ABNORMAL LOW (ref 13.0–17.0)
MCH: 28.1 pg (ref 26.0–34.0)
MCHC: 33.8 g/dL (ref 30.0–36.0)
MCV: 83 fL (ref 78.0–100.0)
Platelets: 256 10*3/uL (ref 150–400)
RBC: 4.42 MIL/uL (ref 4.22–5.81)
RDW: 12.7 % (ref 11.5–15.5)
WBC: 9.8 10*3/uL (ref 4.0–10.5)

## 2013-10-27 LAB — PH, BODY FLUID: pH, Fluid: 7.5

## 2013-10-27 LAB — HEPATITIS PANEL, ACUTE
HCV Ab: NEGATIVE
Hep A IgM: NONREACTIVE
Hep B C IgM: NONREACTIVE
Hepatitis B Surface Ag: NEGATIVE

## 2013-10-27 NOTE — Progress Notes (Signed)
[facility] PROGRESS NOTE   Colin Lee YFV:[id number] DOB: 01-26-1961 DOA: 10/25/2013 PCP: Bronson Curb, PA-C  HPI/Subjective: Feels better, but still has significant left-sided chest pain with taking deep breath.  Assessment/Plan: Principal Problem:   Pleural effusion Active Problems:   Hypertension   Fever   Healthcare-associated infection   HCAP (healthcare-associated pneumonia)   Cholelithiases   Pleural effusion, complicated parapneumonic Recurrent left-sided pleural effusion, status post thoracentesis and removal of 1.3 L. Likely complicated parapneumonic effusion versus empyema thoracis. Pulmonology, Dr. Luan Pulling was consulted. Pleural fluid LDH is 835, WBCs 3820 and protein is 5.4.  Healthcare associated pneumonia Patient started on vancomycin and cefepime, continue.  Fever Likely secondary to pneumonia, continues vancomycin and cefepime.  Hypertension Continue home medications.  Code Status: Full code Family Communication: Plan discussed with the patient. Disposition Plan: Remains inpatient   Consultants:  Dr. Luan Pulling  Procedures:  Thoracentesis with removal of 1.3 L of pleural fluids  Antibiotics:  Vancomycin and cefepime   Objective: Filed Vitals:   10/27/13 1330  BP: 106/74  Pulse: 100  Temp: 100.5 F (38.1 C)  Resp: 18    Intake/Output Summary (Last 24 hours) at 10/27/13 1515 Last data filed at 10/27/13 1200  Gross per 24 hour  Intake    970 ml  Output   1000 ml  Net    -30 ml   Filed Weights   10/25/13 1901 10/26/13 0110 10/27/13 0504  Weight: 125.646 kg (277 lb) 124.2 kg (273 lb 13 oz) 125.9 kg (277 lb 9 oz)    Exam: General: Alert and awake, oriented x3, not in any acute distress. HEENT: anicteric sclera, pupils reactive to light and accommodation, EOMI CVS: S1-S2 clear, no murmur rubs or gallops Chest: clear to auscultation bilaterally, no wheezing, rales or rhonchi Abdomen: soft nontender,  nondistended, normal bowel sounds, no organomegaly Extremities: no cyanosis, clubbing or edema noted bilaterally Neuro: Cranial nerves II-XII intact, no focal neurological deficits  Data Reviewed: Basic Metabolic Panel:  Recent Labs Lab 10/25/13 2006 10/27/13 0625  NA 139 139  K 3.9 4.1  CL 101 101  CO2 27 27  GLUCOSE 94 111*  BUN 18 14  CREATININE 1.01 0.88  CALCIUM 8.9 8.6   Liver Function Tests:  Recent Labs Lab 10/27/13 0625  AST 20  ALT 29  ALKPHOS 65  BILITOT 0.4  PROT 6.6  ALBUMIN 2.9*   No results found for this basename: LIPASE, AMYLASE,  in the last 168 hours No results found for this basename: AMMONIA,  in the last 168 hours CBC:  Recent Labs Lab 10/25/13 2006 10/27/13 0625  WBC 9.0 9.8  NEUTROABS 5.8  --   HGB 12.4* 12.4*  HCT 36.7* 36.7*  MCV 83.4 83.0  PLT 266 256   Cardiac Enzymes:  Recent Labs Lab 10/25/13 2006  TROPONINI <0.30   BNP (last 3 results)  Recent Labs  10/06/13 1942  PROBNP 79.8   CBG: No results found for this basename: GLUCAP,  in the last 168 hours  Micro Recent Results (from the past 240 hour(s))  CULTURE, BLOOD (ROUTINE X 2)     Status: None   Collection Time    10/26/13 12:36 AM      Result Value Ref Range Status   Specimen Description BLOOD LEFT HAND   Final   Special Requests BOTTLES DRAWN AEROBIC AND ANAEROBIC 6CC   Final   Culture NO GROWTH 1 DAY   Final   Report Status PENDING   Incomplete  CULTURE, BLOOD (ROUTINE X 2)     Status: None   Collection Time    10/26/13 12:36 AM      Result Value Ref Range Status   Specimen Description BLOOD RIGHT HAND   Final   Special Requests BOTTLES DRAWN AEROBIC AND ANAEROBIC [place]   Final   Culture NO GROWTH 1 DAY   Final   Report Status PENDING   Incomplete  AFB CULTURE WITH SMEAR     Status: None   Collection Time    10/26/13  2:45 PM      Result Value Ref Range Status   Specimen Description FLUID LEFT PLEURAL   Final   Special Requests NONE   Final   Acid  Fast Smear     Final   Value: NO ACID FAST BACILLI SEEN     Performed at [facility]   Culture     Final   Value: CULTURE WILL BE EXAMINED FOR 6 WEEKS BEFORE ISSUING A FINAL REPORT     Performed at [facility]   Report Status PENDING   Incomplete  BODY FLUID CULTURE     Status: None   Collection Time    10/26/13  2:45 PM      Result Value Ref Range Status   Specimen Description PLEURAL FLUID   Final   Special Requests NONE   Final   Gram Stain     Final   Value: FEW WBC PRESENT,BOTH PMN AND MONONUCLEAR     NO ORGANISMS SEEN     Performed at [facility]   Culture     Final   Value: NO GROWTH 1 DAY     Performed at [facility]   Report Status PENDING   Incomplete     Studies: Dg Chest 1 View  10/26/2013   CLINICAL DATA:  Status post thoracentesis  EXAM: CHEST - 1 VIEW  COMPARISON:  10/25/2013  FINDINGS: Decreased left pleural effusion. No evidence of pneumothorax. Stable cardiac enlargement. Mild elevation of the right diaphragm.  IMPRESSION: No evidence of pneumothorax.   Electronically Signed   By: Skipper Cliche M.D.   On: 10/26/2013 15:20   Dg Chest 2 View  10/25/2013   CLINICAL DATA:  Chest discomfort  EXAM: CHEST  2 VIEW  COMPARISON:  Chest radiograph 10/08/2013  FINDINGS: Normal cardiac silhouette. There is bibasilar atelectasis similar prior. There is a trace small effusion which is also similar prior. No pulmonary edema. No pneumothorax.  IMPRESSION: No clear acute findings.  Bibasilar atelectasis and small left effusion.   Electronically Signed   By: Suzy Bouchard M.D.   On: 10/25/2013 19:24   Ct Angio Chest W/cm &/or Wo Cm  10/25/2013   CLINICAL DATA:  Shortness of breath with left pleural effusion. Question pulmonary embolism.  EXAM: CT ANGIOGRAPHY CHEST WITH CONTRAST  TECHNIQUE: Multidetector CT imaging of the chest was performed using the standard protocol during bolus administration of intravenous contrast. Multiplanar CT image  reconstructions and MIPs were obtained to evaluate the vascular anatomy.  CONTRAST:  115mL OMNIPAQUE IOHEXOL 350 MG/ML SOLN  COMPARISON:  Radiographs 10/25/2013.  CT 10/06/2013.  FINDINGS: Vascular: The pulmonary arteries are well opacified with contrast. There is no evidence of acute pulmonary embolism. There is stable mild aortic atherosclerosis.  Mediastinum: Small mediastinal and hilar lymph nodes are stable, not pathologically enlarged. The thyroid gland, trachea and esophagus appear normal. The heart size is normal.  Lungs/Pleura: There is a persistent large left  pleural effusion which is similar to the recent studies. There is no significant right pleural or pericardial effusion. Left lower lobe atelectasis appears mildly improved. There is no evidence of endobronchial lesion. In the right lung, there is new opacification of previously demonstrated right upper lobe paraseptal blebs medially. There is mild adjacent right upper lobe airspace disease.  Upper abdomen: Stable hepatic steatosis, cysts in the dome of the right hepatic lobe and multiple calcified gallstones. No evidence of adrenal mass.  Musculoskeletal/Chest wall: No chest wall lesion or acute osseous findings.  Review of the MIP images confirms the above findings.  IMPRESSION: 1. No evidence of acute pulmonary embolism. 2. Persistent large left pleural effusion. Patient underwent thoracentesis 10/07/2013. Correlation with prior thoracentesis results recommended. 3. New opacification of previously demonstrated paraseptal blebs medially in the right upper lobe. Given the recent change, this is presumably secondary to superimposed infection or hemorrhage. There is some adjacent right upper lobe airspace disease. 4. Stable appearance of the upper abdomen with hepatic steatosis and cholelithiasis.   Electronically Signed   By: Camie Patience M.D.   On: 10/25/2013 23:56   US Thoracentesis Asp Pleural Space W/img Guide  10/26/2013   CLINICAL DATA:   Recurrent left pleural effusion.  EXAM: ULTRASOUND GUIDED LEFT THORACENTESIS  COMPARISON:  10/07/2013.  PROCEDURE: An ultrasound guided thoracentesis was thoroughly discussed with the patient and questions answered. The benefits, risks, alternatives and complications were also discussed. The patient understands and wishes to proceed with the procedure. Written consent was obtained.  Ultrasound was performed to localize and mark an adequate pocket of fluid in the left chest. The area was then prepped and draped in the normal sterile fashion. 1% Lidocaine was used for local anesthesia. Under ultrasound guidance a 19 gauge Yueh catheter was introduced. Thoracentesis was performed. The catheter was removed and a dressing applied.  Complications:  None.  FINDINGS: A total of approximately 1300 cc of serous fluid was removed. A fluid sample was sent for laboratory analysis.  IMPRESSION: Successful ultrasound guided left sided thoracentesis yielding 1300 cc of pleural fluid.   Electronically Signed   By: Vinnie Langton M.D.   On: 10/26/2013 15:34    Scheduled Meds: . ceFEPime (MAXIPIME) IV  2 g Intravenous Q12H  . losartan  50 mg Oral Daily  . pantoprazole  40 mg Oral Daily  . rOPINIRole  0.25 mg Oral QHS  . vancomycin  1,000 mg Intravenous Q8H   Continuous Infusions:      Time spent: 35 minutes    [facility] A  [facility] Pager [phone] If 7PM-7AM, please contact night-coverage at [email or web address], password [facility] 10/27/2013, 3:15 PM  LOS: 2 days

## 2013-10-27 NOTE — Progress Notes (Signed)
Subjective: He had thoracentesis with removal of about 1800 cc yesterday. He is complaining of pleuritic chest pain since the procedure. When he had thoracentesis about 2 weeks ago he had very high fever within 24 hours after that. That has not occurred on this occasion  Objective: Vital signs in last 24 hours: Temp:  [98.6 F (37 C)-99.3 F (37.4 C)] 99.3 F (37.4 C) (07/22 0504) Pulse Rate:  [80-96] 96 (07/22 0504) Resp:  [20] 20 (07/22 0504) BP: (110-140)/(51-88) 110/51 mmHg (07/22 0504) SpO2:  [97 %-98 %] 97 % (07/22 0504) Weight:  [125.9 kg (277 lb 9 oz)] 125.9 kg (277 lb 9 oz) (07/22 0504) Weight change: 0.254 kg (9 oz) Last BM Date: 10/25/13  Intake/Output from previous day: 07/21 0701 - 07/22 0700 In: 600 [P.O.:600] Out: 1000 [Urine:1000]  PHYSICAL EXAM General appearance: alert, cooperative and moderate distress Resp: His chest is pretty clear but he is splinting the left side Cardio: regular rate and rhythm, S1, S2 normal, no murmur, click, rub or gallop GI: soft, non-tender; bowel sounds normal; no masses,  no organomegaly Extremities: extremities normal, atraumatic, no cyanosis or edema  Lab Results:  Results for orders placed during the hospital encounter of 10/25/13 (from the past 48 hour(s))  CBC WITH DIFFERENTIAL     Status: Abnormal   Collection Time    10/25/13  8:06 PM      Result Value Ref Range   WBC 9.0  4.0 - 10.5 K/uL   RBC 4.40  4.22 - 5.81 MIL/uL   Hemoglobin 12.4 (*) 13.0 - 17.0 g/dL   HCT 36.7 (*) 39.0 - 52.0 %   MCV 83.4  78.0 - 100.0 fL   MCH 28.2  26.0 - 34.0 pg   MCHC 33.8  30.0 - 36.0 g/dL   RDW 13.0  11.5 - 15.5 %   Platelets 266  150 - 400 K/uL   Neutrophils Relative % 64  43 - 77 %   Neutro Abs 5.8  1.7 - 7.7 K/uL   Lymphocytes Relative 23  12 - 46 %   Lymphs Abs 2.0  0.7 - 4.0 K/uL   Monocytes Relative 9  3 - 12 %   Monocytes Absolute 0.8  0.1 - 1.0 K/uL   Eosinophils Relative 4  0 - 5 %   Eosinophils Absolute 0.3  0.0 - 0.7  K/uL   Basophils Relative 0  0 - 1 %   Basophils Absolute 0.0  0.0 - 0.1 K/uL  BASIC METABOLIC PANEL     Status: Abnormal   Collection Time    10/25/13  8:06 PM      Result Value Ref Range   Sodium 139  137 - 147 mEq/L   Potassium 3.9  3.7 - 5.3 mEq/L   Chloride 101  96 - 112 mEq/L   CO2 27  19 - 32 mEq/L   Glucose, Bld 94  70 - 99 mg/dL   BUN 18  6 - 23 mg/dL   Creatinine, Ser 1.01  0.50 - 1.35 mg/dL   Calcium 8.9  8.4 - 10.5 mg/dL   GFR calc non Af Amer 84 (*) >90 mL/min   GFR calc Af Amer >90  >90 mL/min   Comment: (NOTE)     The eGFR has been calculated using the CKD EPI equation.     This calculation has not been validated in all clinical situations.     eGFR's persistently <90 mL/min signify possible Chronic Kidney  Disease.   Anion gap 11  5 - 15  TROPONIN I     Status: None   Collection Time    10/25/13  8:06 PM      Result Value Ref Range   Troponin I <0.30  <0.30 ng/mL   Comment:            Due to the release kinetics of cTnI,     a negative result within the first hours     of the onset of symptoms does not rule out     myocardial infarction with certainty.     If myocardial infarction is still suspected,     repeat the test at appropriate intervals.  CULTURE, BLOOD (ROUTINE X 2)     Status: None   Collection Time    10/26/13 12:36 AM      Result Value Ref Range   Specimen Description Blood BLOOD LEFT HAND     Special Requests BOTTLES DRAWN AEROBIC AND ANAEROBIC 6CC     Culture NO GROWTH <24 HRS     Report Status PENDING    CULTURE, BLOOD (ROUTINE X 2)     Status: None   Collection Time    10/26/13 12:36 AM      Result Value Ref Range   Specimen Description Blood BLOOD RIGHT HAND     Special Requests BOTTLES DRAWN AEROBIC AND ANAEROBIC [place]     Culture NO GROWTH <24 HRS     Report Status PENDING    HIV ANTIBODY (ROUTINE TESTING)     Status: None   Collection Time    10/26/13  1:12 AM      Result Value Ref Range   HIV 1&2 Ab, 4th Generation  NONREACTIVE  NONREACTIVE   Comment: (NOTE)     A NONREACTIVE HIV Ag/Ab result does not exclude HIV infection since     the time frame for seroconversion is variable. If acute HIV infection     is suspected, a HIV-1 RNA Qualitative TMA test is recommended.     HIV-1/2 Antibody Diff         Not indicated.     HIV-1 RNA, Qual TMA           Not indicated.     PLEASE NOTE: This information has been disclosed to you from records     whose confidentiality may be protected by state law. If your state     requires such protection, then the state law prohibits you from making     any further disclosure of the information without the specific written     consent of the person to whom it pertains, or as otherwise permitted     by law. A general authorization for the release of medical or other     information is NOT sufficient for this purpose.     The performance of this assay has not been clinically validated in     patients less than 71 years old.     Performed at [place] DEHYDROGENASE, BODY FLUID     Status: Abnormal   Collection Time    10/26/13  2:45 PM      Result Value Ref Range   LD, Fluid 835 (*) 3 - 23 U/L   Fluid Type-FLDH LEFT     Comment: PLEURAL     FLUID     CORRECTED ON 07/21 AT 1548: PREVIOUSLY REPORTED AS Pleural Fld  PROTEIN, BODY FLUID  Status: None   Collection Time    10/26/13  2:45 PM      Result Value Ref Range   Total protein, fluid 5.4     Comment: NO NORMAL RANGE ESTABLISHED FOR THIS TEST   Fluid Type-FTP LEFT     Comment: PLEURAL     FLUID     CORRECTED ON 07/21 AT 1549: PREVIOUSLY REPORTED AS Pleural Fld  BODY FLUID CELL COUNT WITH DIFFERENTIAL     Status: Abnormal   Collection Time    10/26/13  2:45 PM      Result Value Ref Range   Fluid Type-FCT LEFT     Comment: PLEURAL     FLUID     CORRECTED ON 07/21 AT 1833: PREVIOUSLY REPORTED AS Body Fluid   Color, Fluid YELLOW  YELLOW   Appearance, Fluid CLOUDY (*) CLEAR   WBC, Fluid  3820 (*) 0 - 1000 cu mm   Neutrophil Count, Fluid 64 (*) 0 - 25 %   Lymphs, Fluid 15     Monocyte-Macrophage-Serous Fluid 15 (*) 50 - 90 %   Eos, Fluid 6     Other Cells, Fluid RARE     Comment: OTHER CELLS IDENTIFIED AS MESOTHELIAL CELLS     CYTOLOGY TO FOLLOW,=.  BODY FLUID CULTURE     Status: None   Collection Time    10/26/13  2:45 PM      Result Value Ref Range   Specimen Description PLEURAL FLUID     Special Requests NONE     Gram Stain       Value: FEW WBC PRESENT,BOTH PMN AND MONONUCLEAR     NO ORGANISMS SEEN     Performed at [facility]   Culture       Value: NO GROWTH 1 DAY     Performed at [facility]   Report Status PENDING    GLUCOSE, SEROUS FLUID     Status: None   Collection Time    10/26/13  2:45 PM      Result Value Ref Range   Glucose, Fluid 91     Comment:            FLUID GLUCOSE LEVELS OF <60     mg/dL OR VALUES OF 40 mg/dL     LESS THAN A SIMULTANEOUS     SERUM LEVEL ARE CONSIDERED     DECREASED.   Fluid Type-FGLU LEFT     Comment: PLEURAL     FLUID     CORRECTED ON 07/21 AT 1547: PREVIOUSLY REPORTED AS Pleural Fld  COMPREHENSIVE METABOLIC PANEL     Status: Abnormal   Collection Time    10/27/13  6:25 AM      Result Value Ref Range   Sodium 139  137 - 147 mEq/L   Potassium 4.1  3.7 - 5.3 mEq/L   Chloride 101  96 - 112 mEq/L   CO2 27  19 - 32 mEq/L   Glucose, Bld 111 (*) 70 - 99 mg/dL   BUN 14  6 - 23 mg/dL   Creatinine, Ser 0.88  0.50 - 1.35 mg/dL   Calcium 8.6  8.4 - 10.5 mg/dL   Total Protein 6.6  6.0 - 8.3 g/dL   Albumin 2.9 (*) 3.5 - 5.2 g/dL   AST 20  0 - 37 U/L   ALT 29  0 - 53 U/L   Alkaline Phosphatase 65  39 - 117 U/L   Total Bilirubin 0.4  0.3 - 1.2 mg/dL   GFR calc non Af Amer >90  >90 mL/min   GFR calc Af Amer >90  >90 mL/min   Comment: (NOTE)     The eGFR has been calculated using the CKD EPI equation.     This calculation has not been validated in all clinical situations.     eGFR's persistently <90  mL/min signify possible Chronic Kidney     Disease.   Anion gap 11  5 - 15  CBC     Status: Abnormal   Collection Time    10/27/13  6:25 AM      Result Value Ref Range   WBC 9.8  4.0 - 10.5 K/uL   RBC 4.42  4.22 - 5.81 MIL/uL   Hemoglobin 12.4 (*) 13.0 - 17.0 g/dL   HCT 36.7 (*) 39.0 - 52.0 %   MCV 83.0  78.0 - 100.0 fL   MCH 28.1  26.0 - 34.0 pg   MCHC 33.8  30.0 - 36.0 g/dL   RDW 12.7  11.5 - 15.5 %   Platelets 256  150 - 400 K/uL    ABGS No results found for this basename: PHART, PCO2, PO2ART, TCO2, HCO3,  in the last 72 hours CULTURES Recent Results (from the past 240 hour(s))  CULTURE, BLOOD (ROUTINE X 2)     Status: None   Collection Time    10/26/13 12:36 AM      Result Value Ref Range Status   Specimen Description Blood BLOOD LEFT HAND   Final   Special Requests BOTTLES DRAWN AEROBIC AND ANAEROBIC 6CC   Final   Culture NO GROWTH <24 HRS   Final   Report Status PENDING   Incomplete  CULTURE, BLOOD (ROUTINE X 2)     Status: None   Collection Time    10/26/13 12:36 AM      Result Value Ref Range Status   Specimen Description Blood BLOOD RIGHT HAND   Final   Special Requests BOTTLES DRAWN AEROBIC AND ANAEROBIC [place]   Final   Culture NO GROWTH <24 HRS   Final   Report Status PENDING   Incomplete  BODY FLUID CULTURE     Status: None   Collection Time    10/26/13  2:45 PM      Result Value Ref Range Status   Specimen Description PLEURAL FLUID   Final   Special Requests NONE   Final   Gram Stain     Final   Value: FEW WBC PRESENT,BOTH PMN AND MONONUCLEAR     NO ORGANISMS SEEN     Performed at [facility]   Culture     Final   Value: NO GROWTH 1 DAY     Performed at [facility]   Report Status PENDING   Incomplete   Studies/Results: Dg Chest 1 View  10/26/2013   CLINICAL DATA:  Status post thoracentesis  EXAM: CHEST - 1 VIEW  COMPARISON:  10/25/2013  FINDINGS: Decreased left pleural effusion. No evidence of pneumothorax. Stable cardiac  enlargement. Mild elevation of the right diaphragm.  IMPRESSION: No evidence of pneumothorax.   Electronically Signed   By: Skipper Cliche M.D.   On: 10/26/2013 15:20   Dg Chest 2 View  10/25/2013   CLINICAL DATA:  Chest discomfort  EXAM: CHEST  2 VIEW  COMPARISON:  Chest radiograph 10/08/2013  FINDINGS: Normal cardiac silhouette. There is bibasilar atelectasis similar prior. There is a trace small effusion which is also  similar prior. No pulmonary edema. No pneumothorax.  IMPRESSION: No clear acute findings.  Bibasilar atelectasis and small left effusion.   Electronically Signed   By: Suzy Bouchard M.D.   On: 10/25/2013 19:24   Ct Angio Chest W/cm &/or Wo Cm  10/25/2013   CLINICAL DATA:  Shortness of breath with left pleural effusion. Question pulmonary embolism.  EXAM: CT ANGIOGRAPHY CHEST WITH CONTRAST  TECHNIQUE: Multidetector CT imaging of the chest was performed using the standard protocol during bolus administration of intravenous contrast. Multiplanar CT image reconstructions and MIPs were obtained to evaluate the vascular anatomy.  CONTRAST:  159m OMNIPAQUE IOHEXOL 350 MG/ML SOLN  COMPARISON:  Radiographs 10/25/2013.  CT 10/06/2013.  FINDINGS: Vascular: The pulmonary arteries are well opacified with contrast. There is no evidence of acute pulmonary embolism. There is stable mild aortic atherosclerosis.  Mediastinum: Small mediastinal and hilar lymph nodes are stable, not pathologically enlarged. The thyroid gland, trachea and esophagus appear normal. The heart size is normal.  Lungs/Pleura: There is a persistent large left pleural effusion which is similar to the recent studies. There is no significant right pleural or pericardial effusion. Left lower lobe atelectasis appears mildly improved. There is no evidence of endobronchial lesion. In the right lung, there is new opacification of previously demonstrated right upper lobe paraseptal blebs medially. There is mild adjacent right upper lobe  airspace disease.  Upper abdomen: Stable hepatic steatosis, cysts in the dome of the right hepatic lobe and multiple calcified gallstones. No evidence of adrenal mass.  Musculoskeletal/Chest wall: No chest wall lesion or acute osseous findings.  Review of the MIP images confirms the above findings.  IMPRESSION: 1. No evidence of acute pulmonary embolism. 2. Persistent large left pleural effusion. Patient underwent thoracentesis 10/07/2013. Correlation with prior thoracentesis results recommended. 3. New opacification of previously demonstrated paraseptal blebs medially in the right upper lobe. Given the recent change, this is presumably secondary to superimposed infection or hemorrhage. There is some adjacent right upper lobe airspace disease. 4. Stable appearance of the upper abdomen with hepatic steatosis and cholelithiasis.   Electronically Signed   By: BCamie PatienceM.D.   On: 10/25/2013 23:56   UKoreaThoracentesis Asp Pleural Space W/img Guide  10/26/2013   CLINICAL DATA:  Recurrent left pleural effusion.  EXAM: ULTRASOUND GUIDED LEFT THORACENTESIS  COMPARISON:  10/07/2013.  PROCEDURE: An ultrasound guided thoracentesis was thoroughly discussed with the patient and questions answered. The benefits, risks, alternatives and complications were also discussed. The patient understands and wishes to proceed with the procedure. Written consent was obtained.  Ultrasound was performed to localize and mark an adequate pocket of fluid in the left chest. The area was then prepped and draped in the normal sterile fashion. 1% Lidocaine was used for local anesthesia. Under ultrasound guidance a 19 gauge Yueh catheter was introduced. Thoracentesis was performed. The catheter was removed and a dressing applied.  Complications:  None.  FINDINGS: A total of approximately 1300 cc of serous fluid was removed. A fluid sample was sent for laboratory analysis.  IMPRESSION: Successful ultrasound guided left sided thoracentesis yielding  1300 cc of pleural fluid.   Electronically Signed   By: DVinnie LangtonM.D.   On: 10/26/2013 15:34    Medications:  Prior to Admission:  Prescriptions prior to admission  Medication Sig Dispense Refill  . ibuprofen (ADVIL,MOTRIN) 600 MG tablet Take 600 mg by mouth every 6 (six) hours as needed. pain      . losartan (COZAAR) 50 MG tablet  Take 50 mg by mouth daily.      Marland Kitchen omeprazole (PRILOSEC) 20 MG capsule Take 20 mg by mouth daily.      Marland Kitchen rOPINIRole (REQUIP) 0.25 MG tablet Take 1 tablet (0.25 mg total) by mouth at bedtime.  30 tablet  0  . levofloxacin (LEVAQUIN) 750 MG tablet Take 1 tablet (750 mg total) by mouth daily.  10 tablet  0   Scheduled: . ceFEPime (MAXIPIME) IV  2 g Intravenous Q12H  . losartan  50 mg Oral Daily  . pantoprazole  40 mg Oral Daily  . rOPINIRole  0.25 mg Oral QHS  . vancomycin  1,000 mg Intravenous Q8H   Continuous:  OIB:[id number], acetaminophen, albuterol, morphine injection, ondansetron (ZOFRAN) IV, ondansetron, oxyCODONE  Assesment: He has healthcare associated pneumonia and is being treated for that. He has a pleural effusion on the left side which is not the side with pneumonia. His pleural fluid has a higher white blood count and more neutrophils this time. Principal Problem:   Pleural effusion Active Problems:   Hypertension   Fever   Healthcare-associated infection   HCAP (healthcare-associated pneumonia)   Cholelithiases    Plan: He needs to have his pleural fluid followed and he needs x-rays to document that this totally clears. If he starts to reaccumulate he will need VATS procedure. I discussed that with he and his wife    LOS: 2 days   Mikeal Winstanley L 10/27/2013, 8:59 AM

## 2013-10-28 LAB — CBC
HCT: 37.1 % — ABNORMAL LOW (ref 39.0–52.0)
Hemoglobin: 12.7 g/dL — ABNORMAL LOW (ref 13.0–17.0)
MCH: 28.5 pg (ref 26.0–34.0)
MCHC: 34.2 g/dL (ref 30.0–36.0)
MCV: 83.2 fL (ref 78.0–100.0)
Platelets: 263 10*3/uL (ref 150–400)
RBC: 4.46 MIL/uL (ref 4.22–5.81)
RDW: 12.7 % (ref 11.5–15.5)
WBC: 9.4 10*3/uL (ref 4.0–10.5)

## 2013-10-28 LAB — BASIC METABOLIC PANEL
Anion gap: 9 (ref 5–15)
BUN: 13 mg/dL (ref 6–23)
CO2: 29 mEq/L (ref 19–32)
Calcium: 8.6 mg/dL (ref 8.4–10.5)
Chloride: 98 mEq/L (ref 96–112)
Creatinine, Ser: 0.95 mg/dL (ref 0.50–1.35)
GFR calc Af Amer: >90 mL/min (ref >90)
GFR calc non Af Amer: >90 mL/min (ref >90)
Glucose, Bld: 106 mg/dL — ABNORMAL HIGH (ref 70–99)
Potassium: 4.3 mEq/L (ref 3.7–5.3)
Sodium: 136 mEq/L — ABNORMAL LOW (ref 137–147)

## 2013-10-28 MED ORDER — IBUPROFEN 600 MG PO TABS
600.0000 mg | ORAL_TABLET | Freq: Four times a day (QID) | ORAL | Status: DC | PRN
Start: 2013-10-28 — End: 2013-10-29
  Filled 2013-10-28: qty 1

## 2013-10-28 MED ORDER — POLYETHYLENE GLYCOL 3350 17 G PO PACK
17.0000 g | PACK | Freq: Every day | ORAL | Status: DC
  Administered 2013-10-28 – 2013-10-30 (×3): 17 g via ORAL
  Filled 2013-10-28 (×3): qty 1

## 2013-10-28 NOTE — Care Management Note (Signed)
    Page 1 of 1   10/28/2013     3:40:38 PM CARE MANAGEMENT NOTE 10/28/2013  Patient:  Colin Lee, Colin Lee   Account Number:  [id number]  Date Initiated:  10/26/2013  Documentation initiated by:  [facility]  Subjective/Objective Assessment:   Pt admitted from home with pleural effusion. Pt lives with his wife and will return home at discharge. Pt is independent with ADl's     Action/Plan:   No CM needs noted.   Anticipated DC Date:  10/30/2013   Anticipated DC Plan:  [place]  CM consult      Choice offered to / List presented to:             Status of service:  Completed, signed off Medicare Important Message given?   (If response is "NO", the following Medicare IM given date fields will be blank) Date Medicare IM given:   Medicare IM given by:   Date Additional Medicare IM given:   Additional Medicare IM given by:    Discharge Disposition:  HOME/SELF CARE  Per UR Regulation:  Reviewed for med. necessity/level of care/duration of stay  If discussed at [place] of Stay Meetings, dates discussed:    Comments:  10/28/13 [place], RN BSN CM

## 2013-10-28 NOTE — Progress Notes (Signed)
  PROGRESS NOTE  Colin Lee HBZ:[id number] DOB: 1960-09-24 DOA: 10/25/2013 PCP: Mackey Birchwood  Summary: 53 year old man recently hospitalized for pleural effusion believed to be parapneumonic, status post pleurocentesis, treated with antibiotics who returned to the emergency department 7/21 for fever and shortness of breath as well as pleuritic left-sided chest pain.imaging revealed reaccumulation of left-sided pleural effusion and suggested pneumonia. Admitted for healthcare associated pneumonia and pleural effusion.  Assessment/Plan: 1. Healthcare associated pneumonia clinically improving, low-grade fever. No hypoxia. 2. Recurrent right-sided pleural effusion status post thoracentesis. Culture pending. 3. Incidental findings of cholelithiasis and hepatic steatosis. Asymptomatic.   Continue IV antibiotics per pulmonology. Likely nearing discharge.  Left lateral decubitus x-rays weekly basis, followup pleural effusion, outpatient followup with thoracic surgery.  Code Status: Full code DVT prophylaxis: SCDs Family Communication: none present Disposition Plan: home  Murray Hodgkins, MD  [facility]  Pager [phone] If 7PM-7AM, please contact night-coverage at [email or web address], password [facility] 10/28/2013, 2:10 PM  LOS: 3 days   Consultants:  Pulmonology  Procedures:  7/21 Large-volume thoracentesis 1300 mL  Antibiotics:  Cefepime 7/21 >>   Vancomycin 7/21 >>   HPI/Subjective: Overall feeling okay still has some left-sided chest pain with deep inspiration. Breathing okay.  Objective: Filed Vitals:   10/27/13 1730 10/27/13 2100 10/28/13 0857 10/28/13 1340  BP:  92/53 118/84 112/64  Pulse:  95 97 93  Temp: 99.6 F (37.6 C) 99.5 F (37.5 C) 100.3 F (37.9 C) 100.1 F (37.8 C)  TempSrc: Oral Oral Oral Oral  Resp:  20 20 20   Height:      Weight:      SpO2:  94% 95% 96%    Intake/Output Summary (Last 24 hours) at 10/28/13 1410 Last data filed  at 10/28/13 1200  Gross per 24 hour  Intake    960 ml  Output    800 ml  Net    160 ml     Filed Weights   10/25/13 1901 10/26/13 0110 10/27/13 0504  Weight: 125.646 kg (277 lb) 124.2 kg (273 lb 13 oz) 125.9 kg (277 lb 9 oz)    Exam:     Afebrile, maximum temperature 100.3. Vitals stable. No hypoxia. Gen. Appears calm and comfortable. Ambulating in room without difficulty.  Psych. Alert. Speech fluent and appropriate. Grossly normal mentation.  Cardiovascular. Regular rate and rhythm. No murmur, rub or gallop.  Respiratory. Clear to auscultation bilaterally. No wheezes, rales or rhonchi. Normal respiratory effort.  Data Reviewed:  Chemistry: Basic metabolic panel unremarkable.  Heme: CBC unremarkable.  ID: Pleural fluid culture pending.blood cultures no growth to date.  Scheduled Meds: . ceFEPime (MAXIPIME) IV  2 g Intravenous Q12H  . losartan  50 mg Oral Daily  . pantoprazole  40 mg Oral Daily  . rOPINIRole  0.25 mg Oral QHS  . vancomycin  1,000 mg Intravenous Q8H   Continuous Infusions:   Principal Problem:   Pleural effusion Active Problems:   Hypertension   Fever   Healthcare-associated infection   HCAP (healthcare-associated pneumonia)   Cholelithiases   Time spent 20 minutes

## 2013-10-28 NOTE — Progress Notes (Signed)
Subjective: He feels much better. He has less pleuritic pain today. She's coughing nonproductively. He had a relatively low grade fever documented yesterday but he and his wife say that they think he had more fever or and was given Tylenol but his temperature was not taken at that time  Objective: Vital signs in last 24 hours: Temp:  [99.5 F (37.5 C)-100.5 F (38.1 C)] 99.5 F (37.5 C) (07/22 2100) Pulse Rate:  [95-100] 95 (07/22 2100) Resp:  [18-20] 20 (07/22 2100) BP: (92-112)/(53-92) 92/53 mmHg (07/22 2100) SpO2:  [94 %-97 %] 94 % (07/22 2100) Weight change:  Last BM Date: 10/25/13  Intake/Output from previous day: 07/22 0701 - 07/23 0700 In: 1210 [P.O.:960; IV Piggyback:250] Out: 800 [Urine:800]  PHYSICAL EXAM General appearance: alert, cooperative and mild distress Resp: clear to auscultation bilaterally Cardio: regular rate and rhythm, S1, S2 normal, no murmur, click, rub or gallop GI: soft, non-tender; bowel sounds normal; no masses,  no organomegaly Extremities: extremities normal, atraumatic, no cyanosis or edema  Lab Results:  Results for orders placed during the hospital encounter of 10/25/13 (from the past 48 hour(s))  LACTATE DEHYDROGENASE, BODY FLUID     Status: Abnormal   Collection Time    10/26/13  2:45 PM      Result Value Ref Range   LD, Fluid 835 (*) 3 - 23 U/L   Fluid Type-FLDH LEFT     Comment: PLEURAL     FLUID     CORRECTED ON 07/21 AT 1548: PREVIOUSLY REPORTED AS Pleural Fld  PROTEIN, BODY FLUID     Status: None   Collection Time    10/26/13  2:45 PM      Result Value Ref Range   Total protein, fluid 5.4     Comment: NO NORMAL RANGE ESTABLISHED FOR THIS TEST   Fluid Type-FTP LEFT     Comment: PLEURAL     FLUID     CORRECTED ON 07/21 AT 1549: PREVIOUSLY REPORTED AS Pleural Fld  BODY FLUID CELL COUNT WITH DIFFERENTIAL     Status: Abnormal   Collection Time    10/26/13  2:45 PM      Result Value Ref Range   Fluid Type-FCT LEFT     Comment:  PLEURAL     FLUID     CORRECTED ON 07/21 AT 1833: PREVIOUSLY REPORTED AS Body Fluid   Color, Fluid YELLOW  YELLOW   Appearance, Fluid CLOUDY (*) CLEAR   WBC, Fluid 3820 (*) 0 - 1000 cu mm   Neutrophil Count, Fluid 64 (*) 0 - 25 %   Lymphs, Fluid 15     Monocyte-Macrophage-Serous Fluid 15 (*) 50 - 90 %   Eos, Fluid 6     Other Cells, Fluid RARE     Comment: OTHER CELLS IDENTIFIED AS MESOTHELIAL CELLS     CYTOLOGY TO FOLLOW,=.  AFB CULTURE WITH SMEAR     Status: None   Collection Time    10/26/13  2:45 PM      Result Value Ref Range   Specimen Description FLUID LEFT PLEURAL     Special Requests NONE     Acid Fast Smear       Value: NO ACID FAST BACILLI SEEN     Performed at [facility]   Culture       Value: CULTURE WILL BE EXAMINED FOR 6 WEEKS BEFORE ISSUING A FINAL REPORT     Performed at [facility]   Report Status PENDING  BODY FLUID CULTURE     Status: None   Collection Time    10/26/13  2:45 PM      Result Value Ref Range   Specimen Description PLEURAL FLUID     Special Requests NONE     Gram Stain       Value: FEW WBC PRESENT,BOTH PMN AND MONONUCLEAR     NO ORGANISMS SEEN     Performed at [facility]   Culture       Value: NO GROWTH 1 DAY     Performed at [facility]   Report Status PENDING    GLUCOSE, SEROUS FLUID     Status: None   Collection Time    10/26/13  2:45 PM      Result Value Ref Range   Glucose, Fluid 91     Comment:            FLUID GLUCOSE LEVELS OF <60     mg/dL OR VALUES OF 40 mg/dL     LESS THAN A SIMULTANEOUS     SERUM LEVEL ARE CONSIDERED     DECREASED.   Fluid Type-FGLU LEFT     Comment: PLEURAL     FLUID     CORRECTED ON 07/21 AT 1547: PREVIOUSLY REPORTED AS Pleural Fld  PH, BODY FLUID     Status: None   Collection Time    10/26/13  2:45 PM      Result Value Ref Range   pH, Fluid Type       Value: CORRECTED ON 07/21 AT 1546: PREVIOUSLY REPORTED AS BODY FLUID   Comment: CORRECTED ON 07/22 AT  1444: PREVIOUSLY REPORTED AS LEFT PLEURAL FLUID, CORRECTED ON 07/21 AT 1546: PREVIOUSLY REPORTED AS Body Fluid   pH, Fluid 7.50     Comment: Performed at [place]     Status: Abnormal   Collection Time    10/27/13  6:25 AM      Result Value Ref Range   Sodium 139  137 - 147 mEq/L   Potassium 4.1  3.7 - 5.3 mEq/L   Chloride 101  96 - 112 mEq/L   CO2 27  19 - 32 mEq/L   Glucose, Bld 111 (*) 70 - 99 mg/dL   BUN 14  6 - 23 mg/dL   Creatinine, Ser 0.88  0.50 - 1.35 mg/dL   Calcium 8.6  8.4 - 10.5 mg/dL   Total Protein 6.6  6.0 - 8.3 g/dL   Albumin 2.9 (*) 3.5 - 5.2 g/dL   AST 20  0 - 37 U/L   ALT 29  0 - 53 U/L   Alkaline Phosphatase 65  39 - 117 U/L   Total Bilirubin 0.4  0.3 - 1.2 mg/dL   GFR calc non Af Amer >90  >90 mL/min   GFR calc Af Amer >90  >90 mL/min   Comment: (NOTE)     The eGFR has been calculated using the CKD EPI equation.     This calculation has not been validated in all clinical situations.     eGFR's persistently <90 mL/min signify possible Chronic Kidney     Disease.   Anion gap 11  5 - 15  CBC     Status: Abnormal   Collection Time    10/27/13  6:25 AM      Result Value Ref Range   WBC 9.8  4.0 - 10.5 K/uL   RBC 4.42  4.22 -  5.81 MIL/uL   Hemoglobin 12.4 (*) 13.0 - 17.0 g/dL   HCT 36.7 (*) 39.0 - 52.0 %   MCV 83.0  78.0 - 100.0 fL   MCH 28.1  26.0 - 34.0 pg   MCHC 33.8  30.0 - 36.0 g/dL   RDW 12.7  11.5 - 15.5 %   Platelets 256  150 - 400 K/uL  HEPATITIS PANEL, ACUTE     Status: None   Collection Time    10/27/13  6:25 AM      Result Value Ref Range   Hepatitis B Surface Ag NEGATIVE  NEGATIVE   HCV Ab NEGATIVE  NEGATIVE   Hep A IgM NON REACTIVE  NON REACTIVE   Hep B C IgM NON REACTIVE  NON REACTIVE   Comment: (NOTE)     High levels of Hepatitis B Core IgM antibody are detectable     during the acute stage of Hepatitis B. This antibody is used     to differentiate current from past HBV infection.      Performed at [facility]     Status: Abnormal   Collection Time    10/28/13  5:24 AM      Result Value Ref Range   Sodium 136 (*) 137 - 147 mEq/L   Potassium 4.3  3.7 - 5.3 mEq/L   Chloride 98  96 - 112 mEq/L   CO2 29  19 - 32 mEq/L   Glucose, Bld 106 (*) 70 - 99 mg/dL   BUN 13  6 - 23 mg/dL   Creatinine, Ser 0.95  0.50 - 1.35 mg/dL   Calcium 8.6  8.4 - 10.5 mg/dL   GFR calc non Af Amer >90  >90 mL/min   GFR calc Af Amer >90  >90 mL/min   Comment: (NOTE)     The eGFR has been calculated using the CKD EPI equation.     This calculation has not been validated in all clinical situations.     eGFR's persistently <90 mL/min signify possible Chronic Kidney     Disease.   Anion gap 9  5 - 15  CBC     Status: Abnormal   Collection Time    10/28/13  5:24 AM      Result Value Ref Range   WBC 9.4  4.0 - 10.5 K/uL   RBC 4.46  4.22 - 5.81 MIL/uL   Hemoglobin 12.7 (*) 13.0 - 17.0 g/dL   HCT 37.1 (*) 39.0 - 52.0 %   MCV 83.2  78.0 - 100.0 fL   MCH 28.5  26.0 - 34.0 pg   MCHC 34.2  30.0 - 36.0 g/dL   RDW 12.7  11.5 - 15.5 %   Platelets 263  150 - 400 K/uL    ABGS No results found for this basename: PHART, PCO2, PO2ART, TCO2, HCO3,  in the last 72 hours CULTURES Recent Results (from the past 240 hour(s))  CULTURE, BLOOD (ROUTINE X 2)     Status: None   Collection Time    10/26/13 12:36 AM      Result Value Ref Range Status   Specimen Description BLOOD LEFT HAND   Final   Special Requests BOTTLES DRAWN AEROBIC AND ANAEROBIC 6CC   Final   Culture NO GROWTH 1 DAY   Final   Report Status PENDING   Incomplete  CULTURE, BLOOD (ROUTINE X 2)     Status: None   Collection Time    10/26/13  12:36 AM      Result Value Ref Range Status   Specimen Description BLOOD RIGHT HAND   Final   Special Requests BOTTLES DRAWN AEROBIC AND ANAEROBIC [place]   Final   Culture NO GROWTH 1 DAY   Final   Report Status PENDING   Incomplete  AFB CULTURE WITH SMEAR     Status: None    Collection Time    10/26/13  2:45 PM      Result Value Ref Range Status   Specimen Description FLUID LEFT PLEURAL   Final   Special Requests NONE   Final   Acid Fast Smear     Final   Value: NO ACID FAST BACILLI SEEN     Performed at [facility]   Culture     Final   Value: CULTURE WILL BE EXAMINED FOR 6 WEEKS BEFORE ISSUING A FINAL REPORT     Performed at [facility]   Report Status PENDING   Incomplete  BODY FLUID CULTURE     Status: None   Collection Time    10/26/13  2:45 PM      Result Value Ref Range Status   Specimen Description PLEURAL FLUID   Final   Special Requests NONE   Final   Gram Stain     Final   Value: FEW WBC PRESENT,BOTH PMN AND MONONUCLEAR     NO ORGANISMS SEEN     Performed at [facility]   Culture     Final   Value: NO GROWTH 1 DAY     Performed at [facility]   Report Status PENDING   Incomplete   Studies/Results: Dg Chest 1 View  10/26/2013   CLINICAL DATA:  Status post thoracentesis  EXAM: CHEST - 1 VIEW  COMPARISON:  10/25/2013  FINDINGS: Decreased left pleural effusion. No evidence of pneumothorax. Stable cardiac enlargement. Mild elevation of the right diaphragm.  IMPRESSION: No evidence of pneumothorax.   Electronically Signed   By: Skipper Cliche M.D.   On: 10/26/2013 15:20   US Thoracentesis Asp Pleural Space W/img Guide  10/26/2013   CLINICAL DATA:  Recurrent left pleural effusion.  EXAM: ULTRASOUND GUIDED LEFT THORACENTESIS  COMPARISON:  10/07/2013.  PROCEDURE: An ultrasound guided thoracentesis was thoroughly discussed with the patient and questions answered. The benefits, risks, alternatives and complications were also discussed. The patient understands and wishes to proceed with the procedure. Written consent was obtained.  Ultrasound was performed to localize and mark an adequate pocket of fluid in the left chest. The area was then prepped and draped in the normal sterile fashion. 1% Lidocaine was used for  local anesthesia. Under ultrasound guidance a 19 gauge Yueh catheter was introduced. Thoracentesis was performed. The catheter was removed and a dressing applied.  Complications:  None.  FINDINGS: A total of approximately 1300 cc of serous fluid was removed. A fluid sample was sent for laboratory analysis.  IMPRESSION: Successful ultrasound guided left sided thoracentesis yielding 1300 cc of pleural fluid.   Electronically Signed   By: Vinnie Langton M.D.   On: 10/26/2013 15:34    Medications:  Prior to Admission:  Prescriptions prior to admission  Medication Sig Dispense Refill  . ibuprofen (ADVIL,MOTRIN) 600 MG tablet Take 600 mg by mouth every 6 (six) hours as needed. pain      . losartan (COZAAR) 50 MG tablet Take 50 mg by mouth daily.      Marland Kitchen omeprazole (PRILOSEC) 20 MG capsule Take  20 mg by mouth daily.      Marland Kitchen rOPINIRole (REQUIP) 0.25 MG tablet Take 1 tablet (0.25 mg total) by mouth at bedtime.  30 tablet  0  . levofloxacin (LEVAQUIN) 750 MG tablet Take 1 tablet (750 mg total) by mouth daily.  10 tablet  0   Scheduled: . ceFEPime (MAXIPIME) IV  2 g Intravenous Q12H  . losartan  50 mg Oral Daily  . pantoprazole  40 mg Oral Daily  . rOPINIRole  0.25 mg Oral QHS  . vancomycin  1,000 mg Intravenous Q8H   Continuous:  WGN:[id number], acetaminophen, albuterol, morphine injection, ondansetron (ZOFRAN) IV, ondansetron, oxyCODONE  Assesment: He has pneumonia on the right lung. He has a recurrent pleural effusion on the left and has had another thoracentesis. He says he is better as far as his pleuritic chest pain. He had some fever or yesterday. I think she's probably going to need 24 more hours of IV antibiotics and then he could be discharged home if otherwise okay but will need to have a left lateral decubitus x-rays on approximately weekly basis until either the pleural effusion resolved or if it gets larger. We referred to thoracic surgery Principal Problem:   Pleural effusion Active  Problems:   Hypertension   Fever   Healthcare-associated infection   HCAP (healthcare-associated pneumonia)   Cholelithiases    Plan: As above    LOS: 3 days   Clarabelle Oscarson L 10/28/2013, 8:23 AM

## 2013-10-29 ENCOUNTER — Inpatient Hospital Stay (HOSPITAL_COMMUNITY): Payer: BC Managed Care – PPO

## 2013-10-29 MED ORDER — IBUPROFEN 600 MG PO TABS
600.0000 mg | ORAL_TABLET | Freq: Four times a day (QID) | ORAL | Status: DC
  Administered 2013-10-29 – 2013-10-30 (×5): 600 mg via ORAL
  Filled 2013-10-29 (×6): qty 1

## 2013-10-29 NOTE — Progress Notes (Signed)
Subjective: He doesn't look quite as good this morning. He says he has more pain in his chest. He feels a little more short of breath. He is coughing up some sputum now. He said he feels like he is reaccumulating fluid  Objective: Vital signs in last 24 hours: Temp:  [99.1 F (37.3 C)-100.3 F (37.9 C)] 100.3 F (37.9 C) (07/24 0431) Pulse Rate:  [91-97] 94 (07/24 0431) Resp:  [20] 20 (07/24 0431) BP: (110-118)/(64-84) 111/66 mmHg (07/24 0431) SpO2:  [95 %-96 %] 96 % (07/24 0431) Weight change:  Last BM Date: 10/25/13  Intake/Output from previous day: 07/23 0701 - 07/24 0700 In: 1020 [P.O.:720; IV Piggyback:300] Out: 300 [Urine:300]  PHYSICAL EXAM General appearance: alert, cooperative and mild distress Resp: rhonchi bilaterally Cardio: regular rate and rhythm, S1, S2 normal, no murmur, click, rub or gallop GI: soft, non-tender; bowel sounds normal; no masses,  no organomegaly Extremities: extremities normal, atraumatic, no cyanosis or edema  Lab Results:  Results for orders placed during the hospital encounter of 10/25/13 (from the past 48 hour(s))  BASIC METABOLIC PANEL     Status: Abnormal   Collection Time    10/28/13  5:24 AM      Result Value Ref Range   Sodium 136 (*) 137 - 147 mEq/L   Potassium 4.3  3.7 - 5.3 mEq/L   Chloride 98  96 - 112 mEq/L   CO2 29  19 - 32 mEq/L   Glucose, Bld 106 (*) 70 - 99 mg/dL   BUN 13  6 - 23 mg/dL   Creatinine, Ser 0.95  0.50 - 1.35 mg/dL   Calcium 8.6  8.4 - 10.5 mg/dL   GFR calc non Af Amer >90  >90 mL/min   GFR calc Af Amer >90  >90 mL/min   Comment: (NOTE)     The eGFR has been calculated using the CKD EPI equation.     This calculation has not been validated in all clinical situations.     eGFR's persistently <90 mL/min signify possible Chronic Kidney     Disease.   Anion gap 9  5 - 15  CBC     Status: Abnormal   Collection Time    10/28/13  5:24 AM      Result Value Ref Range   WBC 9.4  4.0 - 10.5 K/uL   RBC 4.46   4.22 - 5.81 MIL/uL   Hemoglobin 12.7 (*) 13.0 - 17.0 g/dL   HCT 37.1 (*) 39.0 - 52.0 %   MCV 83.2  78.0 - 100.0 fL   MCH 28.5  26.0 - 34.0 pg   MCHC 34.2  30.0 - 36.0 g/dL   RDW 12.7  11.5 - 15.5 %   Platelets 263  150 - 400 K/uL    ABGS No results found for this basename: PHART, PCO2, PO2ART, TCO2, HCO3,  in the last 72 hours CULTURES Recent Results (from the past 240 hour(s))  CULTURE, BLOOD (ROUTINE X 2)     Status: None   Collection Time    10/26/13 12:36 AM      Result Value Ref Range Status   Specimen Description BLOOD LEFT HAND   Final   Special Requests BOTTLES DRAWN AEROBIC AND ANAEROBIC 6CC   Final   Culture NO GROWTH 2 DAYS   Final   Report Status PENDING   Incomplete  CULTURE, BLOOD (ROUTINE X 2)     Status: None   Collection Time    10/26/13 12:36 AM  Result Value Ref Range Status   Specimen Description BLOOD RIGHT HAND   Final   Special Requests BOTTLES DRAWN AEROBIC AND ANAEROBIC [place]   Final   Culture NO GROWTH 2 DAYS   Final   Report Status PENDING   Incomplete  AFB CULTURE WITH SMEAR     Status: None   Collection Time    10/26/13  2:45 PM      Result Value Ref Range Status   Specimen Description FLUID LEFT PLEURAL   Final   Special Requests NONE   Final   Acid Fast Smear     Final   Value: NO ACID FAST BACILLI SEEN     Performed at [facility]   Culture     Final   Value: CULTURE WILL BE EXAMINED FOR 6 WEEKS BEFORE ISSUING A FINAL REPORT     Performed at [facility]   Report Status PENDING   Incomplete  BODY FLUID CULTURE     Status: None   Collection Time    10/26/13  2:45 PM      Result Value Ref Range Status   Specimen Description PLEURAL FLUID   Final   Special Requests NONE   Final   Gram Stain     Final   Value: FEW WBC PRESENT,BOTH PMN AND MONONUCLEAR     NO ORGANISMS SEEN     Performed at [facility]   Culture     Final   Value: NO GROWTH 2 DAYS     Performed at [facility]   Report Status PENDING    Incomplete   Studies/Results: No results found.  Medications:  Prior to Admission:  Prescriptions prior to admission  Medication Sig Dispense Refill  . ibuprofen (ADVIL,MOTRIN) 600 MG tablet Take 600 mg by mouth every 6 (six) hours as needed. pain      . losartan (COZAAR) 50 MG tablet Take 50 mg by mouth daily.      Marland Kitchen omeprazole (PRILOSEC) 20 MG capsule Take 20 mg by mouth daily.      Marland Kitchen rOPINIRole (REQUIP) 0.25 MG tablet Take 1 tablet (0.25 mg total) by mouth at bedtime.  30 tablet  0  . levofloxacin (LEVAQUIN) 750 MG tablet Take 1 tablet (750 mg total) by mouth daily.  10 tablet  0   Scheduled: . ceFEPime (MAXIPIME) IV  2 g Intravenous Q12H  . ibuprofen  600 mg Oral QID  . losartan  50 mg Oral Daily  . pantoprazole  40 mg Oral Daily  . polyethylene glycol  17 g Oral Daily  . rOPINIRole  0.25 mg Oral QHS  . vancomycin  1,000 mg Intravenous Q8H   Continuous:  JME:[id number], acetaminophen, albuterol, morphine injection, ondansetron (ZOFRAN) IV, ondansetron, oxyCODONE  Assesment: He was admitted with healthcare associated pneumonia and had a relatively large left pleural effusion. He had thoracentesis this admission and last admission but cultures are negative and no definite cause of the effusion is seen. He doesn't feel as well today and feels like he is reaccumulating fluid. Principal Problem:   Pleural effusion Active Problems:   Hypertension   Fever   Healthcare-associated infection   HCAP (healthcare-associated pneumonia)   Cholelithiases    Plan: He will have chest x-ray and lateral decubitus chest x-ray. If his fluid seems to be reaccumulating I think he's going to require a VATS procedure    LOS: 4 days   Cyruss Arata L 10/29/2013, 8:23 AM

## 2013-10-29 NOTE — Progress Notes (Signed)
PROGRESS NOTE  Colin Lee UXL:[id number] DOB: 1960-10-17 DOA: 10/25/2013 PCP: Mackey Birchwood  Summary: 53 year old man recently hospitalized for pleural effusion believed to be parapneumonic, status post pleurocentesis, treated with antibiotics who returned to the emergency department 7/21 for fever and shortness of breath as well as pleuritic left-sided chest pain.imaging revealed reaccumulation of left-sided pleural effusion and suggested pneumonia. Admitted for healthcare associated pneumonia and pleural effusion.  Assessment/Plan: 1. Healthcare associated pneumonia. Appears stable. Remains afebrile with intermittent low-grade temperature. No hypoxia. 2. Recurrent left-sided pleural effusion status post thoracentesis 7/21. Small amount fluid has reaccumulated. Culture pending, no growth. 3. Incidental findings of cholelithiasis and hepatic steatosis. Asymptomatic.   Overall the patient remains clinically stable. He does have evidence of recurrence of some fluid. He remains without hypoxia and has a normal respiratory rate. Discussed in detail with Dr. Luan Pulling pulmonology. At this point given his clinical stability plan additional 24 hours of IV antibiotics, repeat chest x-ray in the morning to reassess fluid collection. Possible discharge 7/25. No indication for transfer for VATS for now.   I discussed in detail with the patient and his wife at bedside and reviewed chest x-ray findings with him, I showed him the films as well.  Code Status: Full code DVT prophylaxis: SCDs Family Communication:  Disposition Plan: home  Murray Hodgkins, MD  [facility]  Pager [phone] If 7PM-7AM, please contact night-coverage at [email or web address], password [facility] 10/29/2013, 2:53 PM  LOS: 4 days   Consultants:  Pulmonology  Procedures:  7/21 Large-volume thoracentesis left 1300 mL  Antibiotics:  Cefepime 7/21 >>   Vancomycin 7/21 >>   HPI/Subjective: Felt poorly this  morning but as the day has gone on he has felt better. Ambulating okay. Still some left-sided pleuritic pain.  Objective: Filed Vitals:   10/28/13 2249 10/29/13 0431 10/29/13 0905 10/29/13 1348  BP: 110/76 111/66 125/76 98/61  Pulse: 91 94 90 72  Temp: 99.1 F (37.3 C) 100.3 F (37.9 C) 99.1 F (37.3 C) 98.6 F (37 C)  TempSrc: Oral Oral Oral Oral  Resp: 20 20 20 20   Height:      Weight:      SpO2: 95% 96%  97%    Intake/Output Summary (Last 24 hours) at 10/29/13 1453 Last data filed at 10/29/13 1349  Gross per 24 hour  Intake    720 ml  Output    300 ml  Net    420 ml     Filed Weights   10/25/13 1901 10/26/13 0110 10/27/13 0504  Weight: 125.646 kg (277 lb) 124.2 kg (273 lb 13 oz) 125.9 kg (277 lb 9 oz)    Exam:    Afebrile, maximum temperature 100.3. VSS. No hypoxia. Gen. Appears calm and comfortable sitting on side of bed. Psych. Alert. Grossly normal mentation. Speech fluent and clear. Cardiovascular. Regular rate and rhythm. No murmur, rub or gallop. Respiratory. Good air movement. No frank wheezes, rales or rhonchi. Normal respiratory effort.  Data Reviewed:  ID: Pleural fluid culture pending no growth 3 days. Blood cultures no growth to date.  Chest x-ray, left lateral decubitus chest: New enlarging small left pleural effusion  Scheduled Meds: . ceFEPime (MAXIPIME) IV  2 g Intravenous Q12H  . ibuprofen  600 mg Oral QID  . losartan  50 mg Oral Daily  . pantoprazole  40 mg Oral Daily  . polyethylene glycol  17 g Oral Daily  . rOPINIRole  0.25 mg Oral QHS  . vancomycin  1,000 mg Intravenous  Q8H   Continuous Infusions:   Principal Problem:   Pleural effusion Active Problems:   Hypertension   Fever   Healthcare-associated infection   HCAP (healthcare-associated pneumonia)   Cholelithiases   Time spent 25 minutes

## 2013-10-29 NOTE — Progress Notes (Signed)
ANTIBIOTIC CONSULT NOTE - follow up  Pharmacy Consult for vancomycin & cefepime Indication: HCAP   No Known Allergies  Patient Measurements: Height: 5\' 10"  (177.8 cm) Weight: 277 lb 9 oz (125.9 kg) IBW/kg (Calculated) : 73 Adjusted Body Weight: 90kg  Vital Signs: Temp: 99.1 F (37.3 C) (07/24 0905) Temp src: Oral (07/24 0905) BP: 125/76 mmHg (07/24 0905) Pulse Rate: 90 (07/24 0905) Intake/Output from previous day: 07/23 0701 - 07/24 0700 In: 1020 [P.O.:720; IV Piggyback:300] Out: 300 [Urine:300] Intake/Output from this shift: Total I/O In: 240 [P.O.:240] Out: -   Labs:  Recent Labs  10/27/13 0625 10/28/13 0524  WBC 9.8 9.4  HGB 12.4* 12.7*  PLT 256 263  CREATININE 0.88 0.95   Estimated Creatinine Clearance: 122.6 ml/min (by C-G formula based on Cr of 0.95). No results found for this basename: VANCOTROUGH, VANCOPEAK, VANCORANDOM, GENTTROUGH, GENTPEAK, GENTRANDOM, TOBRATROUGH, TOBRAPEAK, TOBRARND, AMIKACINPEAK, AMIKACINTROU, AMIKACIN,  in the last 72 hours   Microbiology: Recent Results (from the past 720 hour(s))  BODY FLUID CULTURE     Status: None   Collection Time    10/07/13 11:00 AM      Result Value Ref Range Status   Specimen Description THORACENTESIS   Final   Special Requests THORACENTESIS   Final   Gram Stain     Final   Value: RARE WBC PRESENT,BOTH PMN AND MONONUCLEAR     NO ORGANISMS SEEN     Performed at [facility]   Culture     Final   Value: NO GROWTH 3 DAYS     Performed at [facility]   Report Status 10/10/2013 FINAL   Final  CULTURE, BLOOD (ROUTINE X 2)     Status: None   Collection Time    10/08/13  9:06 PM      Result Value Ref Range Status   Specimen Description BLOOD LEFT HAND   Final   Special Requests BOTTLES DRAWN AEROBIC AND ANAEROBIC 10CC   Final   Culture NO GROWTH 5 DAYS   Final   Report Status 10/13/2013 FINAL   Final  CULTURE, BLOOD (ROUTINE X 2)     Status: None   Collection Time    10/08/13  9:08 PM       Result Value Ref Range Status   Specimen Description BLOOD RIGHT ARM   Final   Special Requests BOTTLES DRAWN AEROBIC AND ANAEROBIC 12CC   Final   Culture NO GROWTH 5 DAYS   Final   Report Status 10/13/2013 FINAL   Final  CULTURE, BLOOD (ROUTINE X 2)     Status: None   Collection Time    10/26/13 12:36 AM      Result Value Ref Range Status   Specimen Description BLOOD LEFT HAND   Final   Special Requests BOTTLES DRAWN AEROBIC AND ANAEROBIC 6CC   Final   Culture NO GROWTH 2 DAYS   Final   Report Status PENDING   Incomplete  CULTURE, BLOOD (ROUTINE X 2)     Status: None   Collection Time    10/26/13 12:36 AM      Result Value Ref Range Status   Specimen Description BLOOD RIGHT HAND   Final   Special Requests BOTTLES DRAWN AEROBIC AND ANAEROBIC [place]   Final   Culture NO GROWTH 2 DAYS   Final   Report Status PENDING   Incomplete  AFB CULTURE WITH SMEAR     Status: None   Collection Time  10/26/13  2:45 PM      Result Value Ref Range Status   Specimen Description FLUID LEFT PLEURAL   Final   Special Requests NONE   Final   Acid Fast Smear     Final   Value: NO ACID FAST BACILLI SEEN     Performed at [facility]   Culture     Final   Value: CULTURE WILL BE EXAMINED FOR 6 WEEKS BEFORE ISSUING A FINAL REPORT     Performed at [facility]   Report Status PENDING   Incomplete  BODY FLUID CULTURE     Status: None   Collection Time    10/26/13  2:45 PM      Result Value Ref Range Status   Specimen Description PLEURAL FLUID   Final   Special Requests NONE   Final   Gram Stain     Final   Value: FEW WBC PRESENT,BOTH PMN AND MONONUCLEAR     NO ORGANISMS SEEN     Performed at [facility]   Culture     Final   Value: NO GROWTH 2 DAYS     Performed at [facility]   Report Status PENDING   Incomplete   Medical History: Past Medical History  Diagnosis Date  . GERD (gastroesophageal reflux disease)   . Hypertension   . Shortness of breath      Starting May 2015   Medications:  Scheduled:  . ceFEPime (MAXIPIME) IV  2 g Intravenous Q12H  . ibuprofen  600 mg Oral QID  . losartan  50 mg Oral Daily  . pantoprazole  40 mg Oral Daily  . polyethylene glycol  17 g Oral Daily  . rOPINIRole  0.25 mg Oral QHS  . vancomycin  1,000 mg Intravenous Q8H   Infusions:    PRN: acetaminophen, acetaminophen, albuterol, morphine injection, ondansetron (ZOFRAN) IV, ondansetron, oxyCODONE  Assessment: 11yr large male S/P thoracentesis for pulmonary effusion 2 wk's ago (completing 10-14day course of Levaquin), now with persistant left pleural effusion, fever & chills - R/O pneumonia/pleural infection.  To be treated with cefepime and vancomycin.  Goal of Therapy:  Desire vancomycin trough to be 15-65mcg/ml. Eradicate infection.  Plan:  1. Cefepime 2gm IV q8h 2. DC Zosyn dose 3. vancomycin regimen 1gm IV q8h 4. Check trough level tomorrow am  Hart Robinsons A 10/29/2013,11:16 AM

## 2013-10-29 NOTE — Progress Notes (Signed)
Notified Dr. Luan Pulling to inquire

## 2013-10-29 NOTE — Progress Notes (Signed)
Patient having some wheezing this morning. Offered to call respiratory for a PRN breathing treatment but patient declined at this time. Says that he normally gets a little wheezy first thing in the morning. Informed the patient that if wheezing does not resolve or gets worse to let the nurse know. Patient agreed. Will continue to monitor closely.

## 2013-10-30 ENCOUNTER — Inpatient Hospital Stay (HOSPITAL_COMMUNITY): Payer: BC Managed Care – PPO

## 2013-10-30 LAB — BODY FLUID CULTURE: Culture: NO GROWTH

## 2013-10-30 LAB — VANCOMYCIN, RANDOM: Vancomycin Rm: 16.2 ug/mL

## 2013-10-30 MED ORDER — POLYETHYLENE GLYCOL 3350 17 G PO PACK: 17.0000 g | PACK | Freq: Every day | ORAL | Status: DC

## 2013-10-30 MED ORDER — OXYCODONE-ACETAMINOPHEN 5-325 MG PO TABS
1.0000 | ORAL_TABLET | ORAL | Status: DC | PRN
Start: 2013-10-30 — End: 2013-10-30

## 2013-10-30 MED ORDER — AMOXICILLIN-POT CLAVULANATE 875-125 MG PO TABS: 1.0000 | ORAL_TABLET | Freq: Two times a day (BID) | ORAL | Status: DC

## 2013-10-30 MED ORDER — OXYCODONE HCL 5 MG PO TABS: 5.0000 mg | ORAL_TABLET | ORAL | Status: DC | PRN

## 2013-10-30 MED ORDER — AMOXICILLIN-POT CLAVULANATE 875-125 MG PO TABS
1.0000 | ORAL_TABLET | Freq: Two times a day (BID) | ORAL | Status: DC
  Administered 2013-10-30: 1 via ORAL
  Filled 2013-10-30: qty 1

## 2013-10-30 NOTE — Progress Notes (Signed)
  PROGRESS NOTE  FINNIS COLEE QMG:[id number] DOB: 1960-09-08 DOA: 10/25/2013 PCP: Mackey Birchwood  Summary: 53 year old man recently hospitalized for pleural effusion believed to be parapneumonic, status post pleurocentesis, treated with antibiotics who returned to the emergency department 7/21 for fever and shortness of breath as well as pleuritic left-sided chest pain.imaging revealed reaccumulation of left-sided pleural effusion and suggested pneumonia. Admitted for healthcare associated pneumonia and pleural effusion.  Assessment/Plan: 1. Healthcare associated pneumonia. Appears clinically resolved, afebrile. No leukocytosis. 2. Recurrent left-sided pleural effusion status post thoracentesis 7/21. Clinically stable by repeat films. 3. Incidental findings of cholelithiasis and hepatic steatosis. Asymptomatic.   Discussed with radiology and Dr. Luan Pulling. Patient feeling well, afebrile. Plan discharge home today, followup chest x-ray 7/29 with results to be sent to Dr. Luan Pulling for further management. Plan transition to Augmentin as recommended by Dr. Luan Pulling. Culture data on revealing.  Reviewed chest x-rays with wife and patient at bedside, showed new imaging. Reviewed plan in detail.  Murray Hodgkins, MD  [facility]  Pager [phone] If 7PM-7AM, please contact night-coverage at [email or web address], password [facility] 10/30/2013, 11:27 AM  LOS: 5 days   Consultants:  Pulmonology  Procedures:  7/21 Large-volume thoracentesis left 1300 mL  Antibiotics:  Cefepime 7/21 >> 7/25  Vancomycin 7/21 >> 7/25   Augmentin 7/25 >>  8/4  HPI/Subjective: Feeling better. Less pleuritic pain on the left. No fever. Breathing well. Wants to go home.  Objective: Filed Vitals:   10/29/13 0905 10/29/13 1348 10/29/13 1952 10/30/13 0515  BP: 125/76 98/61 132/87 94/53  Pulse: 90 72 86 79  Temp: 99.1 F (37.3 C) 98.6 F (37 C) 98.4 F (36.9 C) 98.6 F (37 C)  TempSrc: Oral Oral  Oral Oral  Resp: 20 20 20 20   Height:      Weight:      SpO2:  97% 97% 96%    Intake/Output Summary (Last 24 hours) at 10/30/13 1127 Last data filed at 10/30/13 1010  Gross per 24 hour  Intake    720 ml  Output      0 ml  Net    720 ml     Filed Weights   10/25/13 1901 10/26/13 0110 10/27/13 0504  Weight: 125.646 kg (277 lb) 124.2 kg (273 lb 13 oz) 125.9 kg (277 lb 9 oz)    Exam:    Afebrile, vitals stable. No hypoxia.  Gen. Appears calm and comfortable sitting on side of bed. Psych. Alert. Speech fluent and clear. Grossly normal mentation. Cardiovascular. Regular rate and rhythm. No murmur, rub or gallop. Respiratory. Clear to auscultation bilaterally. No wheezes, rales or rhonchi. Normal respiratory effort. Skin. Less chest wall appears unremarkable.  Data Reviewed:  ID: Pleural fluid culture no growth, final. Blood cultures remain  no growth to date.  Chest x-ray, left lateral decubitus chest: no significant interval change, discussed with interpreting radiologist Dr. Jasmine December.   Scheduled Meds: . ceFEPime (MAXIPIME) IV  2 g Intravenous Q12H  . ibuprofen  600 mg Oral QID  . losartan  50 mg Oral Daily  . pantoprazole  40 mg Oral Daily  . polyethylene glycol  17 g Oral Daily  . rOPINIRole  0.25 mg Oral QHS  . vancomycin  1,000 mg Intravenous Q8H   Continuous Infusions:   Principal Problem:   Pleural effusion Active Problems:   Hypertension   Fever   Healthcare-associated infection   HCAP (healthcare-associated pneumonia)   Cholelithiases

## 2013-10-30 NOTE — Discharge Summary (Signed)
Physician Discharge Summary  Colin Lee WJX:[id number] DOB: 1960/09/19 DOA: 10/25/2013  PCP: Bronson Curb, PA-C  Admit date: 10/25/2013 Discharge date: 10/30/2013  If has significant recurrent effusion, consider transfer to [facility] for CT surgical consult and VATS.  Recommendations for Outpatient Follow-up:  1. Resolution of left-sided pleural effusion, serial left lateral decubitus film, next scheduled 7/29.  If has significance recurrence may need VATS 2. Resolution of healthcare associated pneumonia 3. Incidental findings of cholelithiasis and hepatic steatosis.   Follow-up Information   Follow up with ROBERTSON, ANTHONY T, PA-C In 2 weeks.   Specialty:  Physician Assistant   Contact information:   [address] [place] [address] [phone]       Follow up with HAWKINS,EDWARD L, MD In 2 weeks.   Specialty:  Pulmonary Disease   Contact information:   [place] [address] [phone]      Discharge Diagnoses:  1. Healthcare associated pneumonia 2. Recurrent left-sided pleural effusion status post thoracentesis 3. Incidental findings of cholelithiasis and hepatic steatosis  Discharge Condition: Improved Disposition: Home  Diet recommendation: Regular  Filed Weights   10/25/13 1901 10/26/13 0110 10/27/13 0504  Weight: 125.646 kg (277 lb) 124.2 kg (273 lb 13 oz) 125.9 kg (277 lb 9 oz)    History of present illness:  53 year old man recently hospitalized for pleural effusion believed to be parapneumonic, status post pleurocentesis, treated with antibiotics who returned to the emergency department 7/21 for fever and shortness of breath as well as pleuritic left-sided chest pain. Imaging revealed reaccumulation of left-sided pleural effusion and suggested pneumonia. Admitted for healthcare associated pneumonia and pleural effusion.  Hospital Course:  Mr. Trice was admitted for further evaluation, seen in  consultation with pulmonology. He underwent large-volume thoracentesis with improvement and fusion. Serial chest x-rays demonstrated small recurrence without evidence of significant enlargement. Healthcare associated pneumonia is clinically resolved at this point. Patient being followed closely by Dr. Luan Pulling who will follow him in the outpatient setting with serial chest x-ray. If effusion recurs, he may need VATS.  1. Healthcare associated pneumonia. Appears clinically resolved, afebrile. No leukocytosis. 2. Recurrent left-sided pleural effusion status post thoracentesis 7/21. Clinically stable by repeat films. 3. Incidental findings of cholelithiasis and hepatic steatosis. Asymptomatic. Discussed with radiology and Dr. Luan Pulling. Patient feeling well, afebrile. Plan discharge home today, followup chest x-ray 7/29 with results to be sent to Dr. Luan Pulling for further management. Plan transition to Augmentin as recommended by Dr. Luan Pulling. Culture data unrevealing.   Consultants:  Pulmonology Procedures:  7/21 Large-volume thoracentesis left 1300 mL Antibiotics:  Cefepime 7/21 >> 7/25  Vancomycin 7/21 >> 7/25  Augmentin 7/25 >> 8/4  Discharge Instructions  Discharge Instructions   Diet general    Complete by:  As directed      Discharge instructions    Complete by:  As directed   Be sure to finish Augmentin antibiotic. Call physician or seek medication attention for fever, shortness of breath, increased pain or worsening of condition. Follow-up chest x-ray next week, you will receive call to schedule.     Increase activity slowly    Complete by:  As directed             Medication List    STOP taking these medications       levofloxacin 750 MG tablet  Commonly known as:  LEVAQUIN      TAKE these medications       amoxicillin-clavulanate 875-125 MG  per tablet  Commonly known as:  AUGMENTIN  Take 1 tablet by mouth every 12 (twelve) hours.     ibuprofen 600 MG tablet  Commonly  known as:  ADVIL,MOTRIN  Take 600 mg by mouth every 6 (six) hours as needed. pain     losartan 50 MG tablet  Commonly known as:  COZAAR  Take 50 mg by mouth daily.     omeprazole 20 MG capsule  Commonly known as:  PRILOSEC  Take 20 mg by mouth daily.     oxyCODONE 5 MG immediate release tablet  Commonly known as:  Oxy IR/ROXICODONE  Take 1 tablet (5 mg total) by mouth every 4 (four) hours as needed for moderate pain.     polyethylene glycol packet  Commonly known as:  MIRALAX / GLYCOLAX  Take 17 g by mouth daily.     rOPINIRole 0.25 MG tablet  Commonly known as:  REQUIP  Take 1 tablet (0.25 mg total) by mouth at bedtime.       No Known Allergies  The results of significant diagnostics from this hospitalization (including imaging, microbiology, ancillary and laboratory) are listed below for reference.    Significant Diagnostic Studies: Dg Chest 2 View  10/29/2013   CLINICAL DATA:  Left chest pain.  EXAM: CHEST  2 VIEW  COMPARISON:  October 26, 2013.  FINDINGS: The heart size and mediastinal contours are within normal limits. No pneumothorax is noted. Mild left pleural effusion is noted which is increased compared to prior exam. Minimal right pleural effusion is noted. Lungs are otherwise clear. The visualized skeletal structures are unremarkable.  IMPRESSION: Mild left pleural effusion which is increased compared to prior exam. No other significant abnormality seen in the chest.   Electronically Signed   By: Sabino Dick M.D.   On: 10/29/2013 13:31   Ct Angio Chest W/cm &/or Wo Cm  10/25/2013   CLINICAL DATA:  Shortness of breath with left pleural effusion. Question pulmonary embolism.  EXAM: CT ANGIOGRAPHY CHEST WITH CONTRAST  TECHNIQUE: Multidetector CT imaging of the chest was performed using the standard protocol during bolus administration of intravenous contrast. Multiplanar CT image reconstructions and MIPs were obtained to evaluate the vascular anatomy.  CONTRAST:  153mL  OMNIPAQUE IOHEXOL 350 MG/ML SOLN  COMPARISON:  Radiographs 10/25/2013.  CT 10/06/2013.  FINDINGS: Vascular: The pulmonary arteries are well opacified with contrast. There is no evidence of acute pulmonary embolism. There is stable mild aortic atherosclerosis.  Mediastinum: Small mediastinal and hilar lymph nodes are stable, not pathologically enlarged. The thyroid gland, trachea and esophagus appear normal. The heart size is normal.  Lungs/Pleura: There is a persistent large left pleural effusion which is similar to the recent studies. There is no significant right pleural or pericardial effusion. Left lower lobe atelectasis appears mildly improved. There is no evidence of endobronchial lesion. In the right lung, there is new opacification of previously demonstrated right upper lobe paraseptal blebs medially. There is mild adjacent right upper lobe airspace disease.  Upper abdomen: Stable hepatic steatosis, cysts in the dome of the right hepatic lobe and multiple calcified gallstones. No evidence of adrenal mass.  Musculoskeletal/Chest wall: No chest wall lesion or acute osseous findings.  Review of the MIP images confirms the above findings.  IMPRESSION: 1. No evidence of acute pulmonary embolism. 2. Persistent large left pleural effusion. Patient underwent thoracentesis 10/07/2013. Correlation with prior thoracentesis results recommended. 3. New opacification of previously demonstrated paraseptal blebs medially in the right upper lobe. Given the  recent change, this is presumably secondary to superimposed infection or hemorrhage. There is some adjacent right upper lobe airspace disease. 4. Stable appearance of the upper abdomen with hepatic steatosis and cholelithiasis.   Electronically Signed   By: Camie Patience M.D.   On: 10/25/2013 23:56   Dg Chest Left Decubitus  10/30/2013   CLINICAL DATA:  Left pleural effusion  EXAM: CHEST - LEFT DECUBITUS  COMPARISON:  PA and lateral chest radiographs October 29, 2013   FINDINGS: There is a moderate free-flowing effusion on the left. No consolidation is seen on this study. Heart size is normal.  IMPRESSION: Moderate free-flowing left pleural effusion.   Electronically Signed   By: Lowella Grip M.D.   On: 10/30/2013 11:00   Dg Chest Left Decubitus  10/29/2013   CLINICAL DATA:  Left chest pain after thoracentesis. Followup of pleural effusion.  EXAM: CHEST - LEFT DECUBITUS  COMPARISON:  10/26/2013  FINDINGS: Left-side down decubitus view. A layering small left-sided pleural effusion. This is new or increased since 10/26/2013. No pneumothorax. Mild cardiomegaly. Patchy left base atelectasis.  IMPRESSION: New or enlarged layering small left pleural effusion.   Electronically Signed   By: Abigail Miyamoto M.D.   On: 10/29/2013 08:40   US Thoracentesis Asp Pleural Space W/img Guide  10/26/2013   CLINICAL DATA:  Recurrent left pleural effusion.  EXAM: ULTRASOUND GUIDED LEFT THORACENTESIS  COMPARISON:  10/07/2013.  PROCEDURE: An ultrasound guided thoracentesis was thoroughly discussed with the patient and questions answered. The benefits, risks, alternatives and complications were also discussed. The patient understands and wishes to proceed with the procedure. Written consent was obtained.  Ultrasound was performed to localize and mark an adequate pocket of fluid in the left chest. The area was then prepped and draped in the normal sterile fashion. 1% Lidocaine was used for local anesthesia. Under ultrasound guidance a 19 gauge Yueh catheter was introduced. Thoracentesis was performed. The catheter was removed and a dressing applied.  Complications:  None.  FINDINGS: A total of approximately 1300 cc of serous fluid was removed. A fluid sample was sent for laboratory analysis.  IMPRESSION: Successful ultrasound guided left sided thoracentesis yielding 1300 cc of pleural fluid.   Electronically Signed   By: Vinnie Langton M.D.   On: 10/26/2013 15:34    Microbiology: Recent  Results (from the past 240 hour(s))  CULTURE, BLOOD (ROUTINE X 2)     Status: None   Collection Time    10/26/13 12:36 AM      Result Value Ref Range Status   Specimen Description BLOOD LEFT HAND   Final   Special Requests BOTTLES DRAWN AEROBIC AND ANAEROBIC 6CC   Final   Culture NO GROWTH 3 DAYS   Final   Report Status PENDING   Incomplete  CULTURE, BLOOD (ROUTINE X 2)     Status: None   Collection Time    10/26/13 12:36 AM      Result Value Ref Range Status   Specimen Description BLOOD RIGHT HAND   Final   Special Requests BOTTLES DRAWN AEROBIC AND ANAEROBIC [address]   Final   Culture NO GROWTH 3 DAYS   Final   Report Status PENDING   Incomplete  AFB CULTURE WITH SMEAR     Status: None   Collection Time    10/26/13  2:45 PM      Result Value Ref Range Status   Specimen Description FLUID LEFT PLEURAL   Final   Special Requests NONE   Final  Acid Fast Smear     Final   Value: NO ACID FAST BACILLI SEEN     Performed at [facility]   Culture     Final   Value: CULTURE WILL BE EXAMINED FOR 6 WEEKS BEFORE ISSUING A FINAL REPORT     Performed at [facility]   Report Status PENDING   Incomplete  BODY FLUID CULTURE     Status: None   Collection Time    10/26/13  2:45 PM      Result Value Ref Range Status   Specimen Description PLEURAL FLUID   Final   Special Requests NONE   Final   Gram Stain     Final   Value: FEW WBC PRESENT,BOTH PMN AND MONONUCLEAR     NO ORGANISMS SEEN     Performed at [facility]   Culture     Final   Value: NO GROWTH 3 DAYS     Performed at [facility]   Report Status 10/30/2013 FINAL   Final     Labs: Basic Metabolic Panel:  Recent Labs Lab 10/25/13 2006 10/27/13 0625 10/28/13 0524  NA 139 139 136*  K 3.9 4.1 4.3  CL 101 101 98  CO2 27 27 29   GLUCOSE 94 111* 106*  BUN 18 14 13   CREATININE 1.01 0.88 0.95  CALCIUM 8.9 8.6 8.6   Liver Function Tests:  Recent Labs Lab 10/27/13 0625  AST 20  ALT 29    ALKPHOS 65  BILITOT 0.4  PROT 6.6  ALBUMIN 2.9*   CBC:  Recent Labs Lab 10/25/13 2006 10/27/13 0625 10/28/13 0524  WBC 9.0 9.8 9.4  NEUTROABS 5.8  --   --   HGB 12.4* 12.4* 12.7*  HCT 36.7* 36.7* 37.1*  MCV 83.4 83.0 83.2  PLT 266 256 263   Cardiac Enzymes:  Recent Labs Lab 10/25/13 2006  TROPONINI <0.30   Principal Problem:   Pleural effusion Active Problems:   Hypertension   Fever   Healthcare-associated infection   HCAP (healthcare-associated pneumonia)   Cholelithiases   Time coordinating discharge: 35 minutes  Signed:  Murray Hodgkins, MD [facility] 10/30/2013, 11:57 AM

## 2013-10-30 NOTE — Progress Notes (Signed)
Discharge instructions reviewed with patient and patient's spouse.  Verbalized understanding.  IV heplock D/C and pressure drsg applied.

## 2013-10-30 NOTE — Progress Notes (Signed)
Subjective: He says he feels much better. He has no new complaints.  Objective: Vital signs in last 24 hours: Temp:  [98.4 F (36.9 C)-98.6 F (37 C)] 98.6 F (37 C) (07/25 0515) Pulse Rate:  [72-86] 79 (07/25 0515) Resp:  [20] 20 (07/25 0515) BP: (94-132)/(53-87) 94/53 mmHg (07/25 0515) SpO2:  [96 %-97 %] 96 % (07/25 0515) Weight change:  Last BM Date: 10/27/13  Intake/Output from previous day: 07/24 0701 - 07/25 0700 In: 720 [P.O.:720] Out: -   PHYSICAL EXAM General appearance: alert, cooperative and no distress Resp: clear to auscultation bilaterally Cardio: regular rate and rhythm, S1, S2 normal, no murmur, click, rub or gallop GI: soft, non-tender; bowel sounds normal; no masses,  no organomegaly Extremities: extremities normal, atraumatic, no cyanosis or edema  Lab Results:  Results for orders placed during the hospital encounter of 10/25/13 (from the past 48 hour(s))  VANCOMYCIN, RANDOM     Status: None   Collection Time    10/30/13  3:07 AM      Result Value Ref Range   Vancomycin Rm 16.2     Comment:            Random Vancomycin therapeutic     range is dependent on dosage and     time of specimen collection.     A peak range is 20.0-40.0 ug/mL     A trough range is 5.0-15.0 ug/mL               ABGS No results found for this basename: PHART, PCO2, PO2ART, TCO2, HCO3,  in the last 72 hours CULTURES Recent Results (from the past 240 hour(s))  CULTURE, BLOOD (ROUTINE X 2)     Status: None   Collection Time    10/26/13 12:36 AM      Result Value Ref Range Status   Specimen Description BLOOD LEFT HAND   Final   Special Requests BOTTLES DRAWN AEROBIC AND ANAEROBIC 6CC   Final   Culture NO GROWTH 3 DAYS   Final   Report Status PENDING   Incomplete  CULTURE, BLOOD (ROUTINE X 2)     Status: None   Collection Time    10/26/13 12:36 AM      Result Value Ref Range Status   Specimen Description BLOOD RIGHT HAND   Final   Special Requests BOTTLES DRAWN AEROBIC  AND ANAEROBIC [place]   Final   Culture NO GROWTH 3 DAYS   Final   Report Status PENDING   Incomplete  AFB CULTURE WITH SMEAR     Status: None   Collection Time    10/26/13  2:45 PM      Result Value Ref Range Status   Specimen Description FLUID LEFT PLEURAL   Final   Special Requests NONE   Final   Acid Fast Smear     Final   Value: NO ACID FAST BACILLI SEEN     Performed at [facility]   Culture     Final   Value: CULTURE WILL BE EXAMINED FOR 6 WEEKS BEFORE ISSUING A FINAL REPORT     Performed at [facility]   Report Status PENDING   Incomplete  BODY FLUID CULTURE     Status: None   Collection Time    10/26/13  2:45 PM      Result Value Ref Range Status   Specimen Description PLEURAL FLUID   Final   Special Requests NONE   Final   Gram Stain  Final   Value: FEW WBC PRESENT,BOTH PMN AND MONONUCLEAR     NO ORGANISMS SEEN     Performed at [facility]   Culture     Final   Value: NO GROWTH 3 DAYS     Performed at [facility]   Report Status PENDING   Incomplete   Studies/Results: Dg Chest 2 View  10/29/2013   CLINICAL DATA:  Left chest pain.  EXAM: CHEST  2 VIEW  COMPARISON:  October 26, 2013.  FINDINGS: The heart size and mediastinal contours are within normal limits. No pneumothorax is noted. Mild left pleural effusion is noted which is increased compared to prior exam. Minimal right pleural effusion is noted. Lungs are otherwise clear. The visualized skeletal structures are unremarkable.  IMPRESSION: Mild left pleural effusion which is increased compared to prior exam. No other significant abnormality seen in the chest.   Electronically Signed   By: Sabino Dick M.D.   On: 10/29/2013 13:31   Dg Chest Left Decubitus  10/29/2013   CLINICAL DATA:  Left chest pain after thoracentesis. Followup of pleural effusion.  EXAM: CHEST - LEFT DECUBITUS  COMPARISON:  10/26/2013  FINDINGS: Left-side down decubitus view. A layering small left-sided pleural  effusion. This is new or increased since 10/26/2013. No pneumothorax. Mild cardiomegaly. Patchy left base atelectasis.  IMPRESSION: New or enlarged layering small left pleural effusion.   Electronically Signed   By: Abigail Miyamoto M.D.   On: 10/29/2013 08:40    Medications:  Prior to Admission:  Prescriptions prior to admission  Medication Sig Dispense Refill  . ibuprofen (ADVIL,MOTRIN) 600 MG tablet Take 600 mg by mouth every 6 (six) hours as needed. pain      . losartan (COZAAR) 50 MG tablet Take 50 mg by mouth daily.      Marland Kitchen omeprazole (PRILOSEC) 20 MG capsule Take 20 mg by mouth daily.      Marland Kitchen rOPINIRole (REQUIP) 0.25 MG tablet Take 1 tablet (0.25 mg total) by mouth at bedtime.  30 tablet  0  . levofloxacin (LEVAQUIN) 750 MG tablet Take 1 tablet (750 mg total) by mouth daily.  10 tablet  0   Scheduled: . ceFEPime (MAXIPIME) IV  2 g Intravenous Q12H  . ibuprofen  600 mg Oral QID  . losartan  50 mg Oral Daily  . pantoprazole  40 mg Oral Daily  . polyethylene glycol  17 g Oral Daily  . rOPINIRole  0.25 mg Oral QHS  . vancomycin  1,000 mg Intravenous Q8H   Continuous:  HKV:[id number], acetaminophen, albuterol, morphine injection, ondansetron (ZOFRAN) IV, ondansetron, oxyCODONE  Assesment:he was admitted with healthcare associated pneumonia and seems to be improving.   His pneumonia as on the right. He also has reaccumulating left pleural effusion which was also thought to be related to pneumonia and required thoracentesis now x2  He says he feels much better. He has no cough congestion or shortness of breath .Principal Problem:   Pleural effusion Active Problems:   Hypertension   Fever   Healthcare-associated infection   HCAP (healthcare-associated pneumonia)   Cholelithiases    Plan: He's to have lateral decubitus chest x-ray today. If this does not show significant reaccumulation of fluid I think he could be discharged to have chest x-rays weekly for at least 3 or 4 weeks. If  he starts having significant reaccumulation I will refer him for thoracic surgery consultation.    LOS: 5 days   Rayonna Heldman L 10/30/2013, 9:07 AM

## 2013-10-30 NOTE — Progress Notes (Signed)
Patient D/C to home with spouse.  No acute distress noted.

## 2013-11-01 LAB — CULTURE, BLOOD (ROUTINE X 2)
Culture: NO GROWTH
Culture: NO GROWTH

## 2013-11-01 NOTE — Progress Notes (Signed)
UR chart review completed.  

## 2013-11-02 ENCOUNTER — Ambulatory Visit (HOSPITAL_COMMUNITY)
Admission: RE | Admit: 2013-11-02 | Discharge: 2013-11-02 | Disposition: A | Discharge status: Home / Self Care | Payer: BC Managed Care – PPO | Source: Ambulatory Visit | Attending: Family Medicine | Admitting: Family Medicine

## 2013-11-02 DIAGNOSIS — J90 Pleural effusion, not elsewhere classified: Secondary | ICD-10-CM | POA: Insufficient documentation

## 2013-11-08 ENCOUNTER — Other Ambulatory Visit (HOSPITAL_COMMUNITY): Payer: Self-pay | Admitting: Pulmonary Disease

## 2013-11-08 ENCOUNTER — Ambulatory Visit (HOSPITAL_COMMUNITY)
Admission: RE | Admit: 2013-11-08 | Discharge: 2013-11-08 | Disposition: A | Discharge status: Home / Self Care | Payer: BC Managed Care – PPO | Source: Ambulatory Visit | Attending: Pulmonary Disease | Admitting: Pulmonary Disease

## 2013-11-08 DIAGNOSIS — J90 Pleural effusion, not elsewhere classified: Secondary | ICD-10-CM

## 2013-11-10 ENCOUNTER — Encounter: Payer: Self-pay | Admitting: Cardiothoracic Surgery

## 2013-11-11 ENCOUNTER — Other Ambulatory Visit: Payer: Self-pay | Admitting: *Deleted

## 2013-11-11 ENCOUNTER — Institutional Professional Consult (permissible substitution) (INDEPENDENT_AMBULATORY_CARE_PROVIDER_SITE_OTHER): Payer: BC Managed Care – PPO | Admitting: Cardiothoracic Surgery

## 2013-11-11 ENCOUNTER — Encounter: Payer: Self-pay | Admitting: Cardiothoracic Surgery

## 2013-11-11 ENCOUNTER — Ambulatory Visit
Admission: RE | Admit: 2013-11-11 | Discharge: 2013-11-11 | Disposition: A | Discharge status: Home / Self Care | Payer: BC Managed Care – PPO | Source: Ambulatory Visit | Attending: Cardiothoracic Surgery | Admitting: Cardiothoracic Surgery

## 2013-11-11 VITALS — BP 128/88 | HR 85 | Resp 20 | Ht 70.0 in | Wt 277.0 lb

## 2013-11-11 DIAGNOSIS — J90 Pleural effusion, not elsewhere classified: Secondary | ICD-10-CM

## 2013-11-11 NOTE — Progress Notes (Signed)
[address]       [place] [address]             [phone]                    Danton R Safi [place] Medical Record #[id number] Date of Birth: [birth year]  Referring: Alonza Bogus, MD Primary Care: [facility]  Chief Complaint:    Chief Complaint  Patient presents with  . Pleural Effusion    Surgical eval with CXR, Chest CTA 10/25/13    History of Present Illness:    Colin Lee 53 y.o. male is seen in the office  today for history of left pleural effusion tapped twice 10/06/2013 and 10/26/2013. Patient  been having pain in his chest since about May. He states he has had some cough was seen in the PCP office and was given a zpack and  had some steroids. He noted increased sob  some shortness of breath. He states that the pain is related to his breathing. Patient was seen in 7/1 and admitted . It was  was noted to drop his saturation to 89% with ambulation. In addition he states that he does have some chronic arthritis and takes Ibuprofen for this as well as RLS. On evaluation of the CXR he does show a pleural effusion on the left side. This was tapped. He was dicharged home and  Returned 7/21 to ER. He had been  discharged on Levaquin. He finished the course of his antibiotics two days later  he started developing shortness of breath. Started having fever and chills. Denies any cough. Has noticed sharp pain in the left side of his chest especially with deep breathing. No nausea, vomiting, or diarrhea. Has had dizziness, but denies any syncopal episode. He has had weakness. Denies any loss of appetite. Denies any sick contacts or travel recently. He was readmitted 7/21 and repeat thoracentesis was done 1300 ml removed from left chest.  The patient was referred to thoracic Surgery yesterday as outpatient. He comes in today and follow up chest xray done  Patient notes that he feels much better than he did over the last several weeks, has  some mild shortness of breath but this is improving, no fever chills. Is planning to go to [facility] this afternoon    Current Activity/ Functional Status:  Patient is independent with mobility/ambulation, transfers, ADL's, IADL's.   Zubrod Score: At the time of surgery this patient's most appropriate activity status/level should be described as: [x]     0    Normal activity, no symptoms []     1    Restricted in physical strenuous activity but ambulatory, able to do out light work []     2    Ambulatory and capable of self care, unable to do work activities, up and about               >50 % of waking hours                              []     3    Only limited self care, in bed greater than 50% of waking hours []     4    Completely disabled, no self care, confined to bed or chair []     5    Moribund   Past Medical History  Diagnosis Date  .  GERD (gastroesophageal reflux disease)   . Hypertension   . Shortness of breath     Starting May 2015    Past Surgical History  Procedure Laterality Date  . No prior surgery    . Thoracentesis Left 2015    Family History  Problem Relation Age of Onset  . Adopted: Yes      History  Smoking status  . Former Smoker -- 1.00 packs/day for 30 years  . Types: Cigarettes  . Quit date: 04/08/2000  Smokeless tobacco  . Never Used    History  Alcohol Use  . Yes    Comment: rare     No Known Allergies  Current Outpatient Prescriptions  Medication Sig Dispense Refill  . amoxicillin-clavulanate (AUGMENTIN) 875-125 MG per tablet Take 1 tablet by mouth every 12 (twelve) hours.  18 tablet  0  . ibuprofen (ADVIL,MOTRIN) 600 MG tablet Take 600 mg by mouth every 6 (six) hours as needed. pain      . losartan (COZAAR) 50 MG tablet Take 50 mg by mouth daily.      Marland Kitchen omeprazole (PRILOSEC) 20 MG capsule Take 20 mg by mouth daily.      Marland Kitchen oxyCODONE (OXY IR/ROXICODONE) 5 MG immediate release tablet Take 1 tablet (5 mg total) by mouth  every 4 (four) hours as needed for moderate pain.  30 tablet  0  . polyethylene glycol (MIRALAX / GLYCOLAX) packet Take 17 g by mouth daily.  30 each  0  . rOPINIRole (REQUIP) 0.25 MG tablet Take 1 tablet (0.25 mg total) by mouth at bedtime.  30 tablet  0   No current facility-administered medications for this visit.   Patient is married and has 3 children currently works for [employer].  Review of Systems:     Cardiac Review of Systems: Y or N  Chest Pain [  With cough  ]  Resting SOB [ n  ] Exertional SOB  [ y ]  Vertell Limber Florencio.Farrier  ]   Pedal Edema Florencio.Farrier   ]    Palpitations [ n ] Syncope  [ n ]   Presyncope [ n  ]  General Review of Systems: [Y] = yes [  ]=no Constitional: recent weight change [  ];  Wt loss over the last 3 months [   ] anorexia [  ]; fatigue [  y]; nausea [  ]; night sweats [ n ]; fever [ n ]; or chills [n  ];          Dental: poor dentition[n  ]; Last Dentist visit:   Eye : blurred vision [  ]; diplopia [   ]; vision changes [  ];  Amaurosis fugax[  ];floaters Resp: cough [  ];  wheezing[ n ];  hemoptysis[n  ]; shortness of breath[y  ]; paroxysmal nocturnal dyspnea[ n ]; dyspnea on exertion[ y ]; or orthopnea[  ];  GI:  gallstones[  ], vomiting[  ];  dysphagia[  ]; melena[  ];  hematochezia [  ]; heartburn[  ];   Hx of  Colonoscopy[  ]; GU: kidney stones [  ]; hematuria[  ];   dysuria [  ];  nocturia[  ];  history of     obstruction [  ]; urinary frequency [  ]             Skin: rash, swelling[  ];, hair loss[  ];  peripheral edema[  ];  or itching[  ]; Musculosketetal: myalgias[  ];  joint swelling[  ];  joint erythema[  ];  joint pain[  ];  back pain[  ];  Heme/Lymph: bruising[  ];  bleeding[  ];  anemia[  ];  Neuro: TIA[ n ];  headaches[  ];  stroke[ n ];  vertigo[  ];  seizures[  ];   paresthesias[  ];  difficulty walking[  ];  Psych:depression[  ]; anxiety[  ];  Endocrine: diabetes[n  ];  thyroid dysfunction[  ];  Immunizations: Flu up to date [ n ];  Pneumococcal up to date [ 10/08/2103 ];  Other:  Physical Exam: Ht 5\' 10"  (1.778 m)  Wt 277 lb (125.646 kg)  BMI 39.75 kg/m2  SpO2 %  PHYSICAL EXAMINATION:  General appearance: alert, cooperative and no distress Neurologic: intact Heart: regular rate and rhythm, S1, S2 normal, no murmur, click, rub or gallop Lungs: diminished breath sounds LLL Abdomen: soft, non-tender; bowel sounds normal; no masses,  no organomegaly Extremities: extremities normal, atraumatic, no cyanosis or edema and Homans sign is negative, no sign of DVT No carotid bruits No cervical or supraclavicular adenopathy   Diagnostic Studies & Laboratory data:     Recent Radiology Findings:   Dg Chest 2 View  11/11/2013   CLINICAL DATA:  History of pleural effusion, former smoking history  EXAM: CHEST  2 VIEW  COMPARISON:  Decubitus films of the chest of 8 3 and 11/02/2013 as well as chest x-ray of 10/29/2013  FINDINGS: Small pleural effusions have not changed significantly, left slightly larger than right, with mild basilar atelectasis. No focal infiltrate is seen. The heart is within upper limits of normal. No bony abnormality is seen.  IMPRESSION: Small bilateral pleural effusions, left slightly larger than right with mild basilar atelectasis   Electronically Signed   By: Ivar Drape M.D.   On: 11/11/2013 09:34   Dg Chest 1 View  10/26/2013   CLINICAL DATA:  Status post thoracentesis  EXAM: CHEST - 1 VIEW  COMPARISON:  10/25/2013  FINDINGS: Decreased left pleural effusion. No evidence of pneumothorax. Stable cardiac enlargement. Mild elevation of the right diaphragm.  IMPRESSION: No evidence of pneumothorax.   Electronically Signed   By: Skipper Cliche M.D.   On: 10/26/2013 15:20   Dg Chest 2 View  10/29/2013   CLINICAL DATA:  Left chest pain.  EXAM: CHEST  2 VIEW  COMPARISON:  October 26, 2013.  FINDINGS: The heart size and mediastinal contours are within normal limits. No pneumothorax is noted. Mild left pleural effusion  is noted which is increased compared to prior exam. Minimal right pleural effusion is noted. Lungs are otherwise clear. The visualized skeletal structures are unremarkable.  IMPRESSION: Mild left pleural effusion which is increased compared to prior exam. No other significant abnormality seen in the chest.   Electronically Signed   By: Sabino Dick M.D.   On: 10/29/2013 13:31   Dg Chest 2 View  10/25/2013   CLINICAL DATA:  Chest discomfort  EXAM: CHEST  2 VIEW  COMPARISON:  Chest radiograph 10/08/2013  FINDINGS: Normal cardiac silhouette. There is bibasilar atelectasis similar prior. There is a trace small effusion which is also similar prior. No pulmonary edema. No pneumothorax.  IMPRESSION: No clear acute findings.  Bibasilar atelectasis and small left effusion.   Electronically Signed   By: Suzy Bouchard M.D.   On: 10/25/2013 19:24   Ct Angio Chest W/cm &/or Wo Cm  10/25/2013   CLINICAL DATA:  Shortness of breath with left pleural effusion. Question  pulmonary embolism.  EXAM: CT ANGIOGRAPHY CHEST WITH CONTRAST  TECHNIQUE: Multidetector CT imaging of the chest was performed using the standard protocol during bolus administration of intravenous contrast. Multiplanar CT image reconstructions and MIPs were obtained to evaluate the vascular anatomy.  CONTRAST:  127mL OMNIPAQUE IOHEXOL 350 MG/ML SOLN  COMPARISON:  Radiographs 10/25/2013.  CT 10/06/2013.  FINDINGS: Vascular: The pulmonary arteries are well opacified with contrast. There is no evidence of acute pulmonary embolism. There is stable mild aortic atherosclerosis.  Mediastinum: Small mediastinal and hilar lymph nodes are stable, not pathologically enlarged. The thyroid gland, trachea and esophagus appear normal. The heart size is normal.  Lungs/Pleura: There is a persistent large left pleural effusion which is similar to the recent studies. There is no significant right pleural or pericardial effusion. Left lower lobe atelectasis appears mildly  improved. There is no evidence of endobronchial lesion. In the right lung, there is new opacification of previously demonstrated right upper lobe paraseptal blebs medially. There is mild adjacent right upper lobe airspace disease.  Upper abdomen: Stable hepatic steatosis, cysts in the dome of the right hepatic lobe and multiple calcified gallstones. No evidence of adrenal mass.  Musculoskeletal/Chest wall: No chest wall lesion or acute osseous findings.  Review of the MIP images confirms the above findings.  IMPRESSION: 1. No evidence of acute pulmonary embolism. 2. Persistent large left pleural effusion. Patient underwent thoracentesis 10/07/2013. Correlation with prior thoracentesis results recommended. 3. New opacification of previously demonstrated paraseptal blebs medially in the right upper lobe. Given the recent change, this is presumably secondary to superimposed infection or hemorrhage. There is some adjacent right upper lobe airspace disease. 4. Stable appearance of the upper abdomen with hepatic steatosis and cholelithiasis.   Electronically Signed   By: Camie Patience M.D.   On: 10/25/2013 23:56   Dg Chest Left Decubitus  11/09/2013   CLINICAL DATA:  Pleural effusion.  EXAM: CHEST - LEFT DECUBITUS  COMPARISON:  Decubitus chest x-rays of 728 and 10/30/2013. PA and lateral chest x-ray 10/29/2013.  FINDINGS: Stable small layering left pleural effusion is again noted. No other focal abnormalities identified.  IMPRESSION: Stable small layering left pleural effusion.   Electronically Signed   By: Marcello Moores  Register   On: 11/09/2013 08:47   Dg Chest Left Decubitus  11/02/2013   CLINICAL DATA:  Evaluate left effusion.  EXAM: CHEST - LEFT DECUBITUS  COMPARISON:  10/30/2013  FINDINGS: Similar volume of free flowing left pleural effusion. The right lung appears clear. No airspace consolidation.  IMPRESSION: 1. Similar volume of left pleural effusion.   Electronically Signed   By: Kerby Moors M.D.   On:  11/02/2013 12:54   Dg Chest Left Decubitus  10/30/2013   CLINICAL DATA:  Left pleural effusion  EXAM: CHEST - LEFT DECUBITUS  COMPARISON:  PA and lateral chest radiographs October 29, 2013  FINDINGS: There is a moderate free-flowing effusion on the left. No consolidation is seen on this study. Heart size is normal.  IMPRESSION: Moderate free-flowing left pleural effusion.   Electronically Signed   By: Lowella Grip M.D.   On: 10/30/2013 11:00   Dg Chest Left Decubitus  10/29/2013   CLINICAL DATA:  Left chest pain after thoracentesis. Followup of pleural effusion.  EXAM: CHEST - LEFT DECUBITUS  COMPARISON:  10/26/2013  FINDINGS: Left-side down decubitus view. A layering small left-sided pleural effusion. This is new or increased since 10/26/2013. No pneumothorax. Mild cardiomegaly. Patchy left base atelectasis.  IMPRESSION: New or enlarged layering  small left pleural effusion.   Electronically Signed   By: Abigail Miyamoto M.D.   On: 10/29/2013 08:40   US Thoracentesis Asp Pleural Space W/img Guide  10/26/2013   CLINICAL DATA:  Recurrent left pleural effusion.  EXAM: ULTRASOUND GUIDED LEFT THORACENTESIS  COMPARISON:  10/07/2013.  PROCEDURE: An ultrasound guided thoracentesis was thoroughly discussed with the patient and questions answered. The benefits, risks, alternatives and complications were also discussed. The patient understands and wishes to proceed with the procedure. Written consent was obtained.  Ultrasound was performed to localize and mark an adequate pocket of fluid in the left chest. The area was then prepped and draped in the normal sterile fashion. 1% Lidocaine was used for local anesthesia. Under ultrasound guidance a 19 gauge Yueh catheter was introduced. Thoracentesis was performed. The catheter was removed and a dressing applied.  Complications:  None.  FINDINGS: A total of approximately 1300 cc of serous fluid was removed. A fluid sample was sent for laboratory analysis.  IMPRESSION:  Successful ultrasound guided left sided thoracentesis yielding 1300 cc of pleural fluid.   Electronically Signed   By: Vinnie Langton M.D.   On: 10/26/2013 15:34      Recent Lab Findings: Lab Results  Component Value Date   WBC 9.4 10/28/2013   HGB 12.7* 10/28/2013   HCT 37.1* 10/28/2013   PLT 263 10/28/2013   GLUCOSE 106* 10/28/2013   ALT 29 10/27/2013   AST 20 10/27/2013   NA 136* 10/28/2013   K 4.3 10/28/2013   CL 98 10/28/2013   CREATININE 0.95 10/28/2013   BUN 13 10/28/2013   CO2 29 10/28/2013   TSH 2.100 10/06/2013   HGBA1C 6.1* 10/06/2013   Cytology from pleural fluid  Diagnosis PLEURAL FLUID, LEFT(SPECIMEN 1 OF 1 COLLECTED 10/26/13): BENIGN. REACTIVE MESOTHELIAL CELLS. ABUNDANT ACUTE INFLAMMATION  Culture of pleural fluid 7/21 FEW WBC PRESENT,BOTH PMN AND MONONUCLEAR NO ORGANISMS SEEN Performed at [facility]  Culture  NO GROWTH 3 DAYS      Assessment / Plan:   Recent pulmonary function with parapneumonic effusion tapped twice, appears stable without obvious recurrence since July 20 Obesity Hepatic steatosis and cholelithiasis  At this point with the patient improving and chest x-ray appear stable I have not recommended proceeding with a invasive procedure. I've discussed with the patient and his wife the natural history of undrained pleural effusions/parapneumonic effusions and the need for close followup. We'll plan to see the patient with a followup chest x-ray in 2 weeks, he is aware to call immediately should he have any change in symptoms    I spent 40 minutes counseling the patient face to face. The total time spent in the appointment was 60 minutes.  Grace Isaac MD      [address] [place] [address] Office [phone]   Beeper [phone]  11/11/2013 9:23 AM

## 2013-11-22 ENCOUNTER — Ambulatory Visit (HOSPITAL_COMMUNITY)
Admission: RE | Admit: 2013-11-22 | Discharge: 2013-11-22 | Disposition: A | Discharge status: Home / Self Care | Payer: BC Managed Care – PPO | Source: Ambulatory Visit | Attending: Pulmonary Disease | Admitting: Pulmonary Disease

## 2013-11-22 ENCOUNTER — Other Ambulatory Visit (HOSPITAL_COMMUNITY): Payer: Self-pay | Admitting: Pulmonary Disease

## 2013-11-22 DIAGNOSIS — J9819 Other pulmonary collapse: Secondary | ICD-10-CM | POA: Insufficient documentation

## 2013-11-22 DIAGNOSIS — J90 Pleural effusion, not elsewhere classified: Secondary | ICD-10-CM

## 2013-11-22 DIAGNOSIS — R079 Chest pain, unspecified: Secondary | ICD-10-CM

## 2013-11-25 ENCOUNTER — Ambulatory Visit: Payer: Self-pay | Admitting: Cardiothoracic Surgery

## 2013-11-26 ENCOUNTER — Other Ambulatory Visit: Payer: Self-pay | Admitting: Cardiothoracic Surgery

## 2013-11-26 DIAGNOSIS — J90 Pleural effusion, not elsewhere classified: Secondary | ICD-10-CM

## 2013-12-01 ENCOUNTER — Ambulatory Visit: Payer: Self-pay | Admitting: Cardiothoracic Surgery

## 2013-12-06 ENCOUNTER — Other Ambulatory Visit (HOSPITAL_COMMUNITY): Payer: Self-pay | Admitting: Pulmonary Disease

## 2013-12-06 ENCOUNTER — Ambulatory Visit (HOSPITAL_COMMUNITY)
Admission: RE | Admit: 2013-12-06 | Discharge: 2013-12-06 | Disposition: A | Discharge status: Home / Self Care | Payer: BC Managed Care – PPO | Source: Ambulatory Visit | Attending: Pulmonary Disease | Admitting: Pulmonary Disease

## 2013-12-06 DIAGNOSIS — R079 Chest pain, unspecified: Secondary | ICD-10-CM

## 2013-12-06 DIAGNOSIS — J90 Pleural effusion, not elsewhere classified: Secondary | ICD-10-CM | POA: Insufficient documentation

## 2013-12-07 ENCOUNTER — Other Ambulatory Visit (HOSPITAL_COMMUNITY): Payer: Self-pay

## 2013-12-08 LAB — AFB CULTURE WITH SMEAR (NOT AT ARMC): Acid Fast Smear: NONE SEEN

## 2013-12-09 ENCOUNTER — Ambulatory Visit (HOSPITAL_COMMUNITY)
Admission: RE | Admit: 2013-12-09 | Discharge: 2013-12-09 | Disposition: A | Discharge status: Home / Self Care | Payer: BC Managed Care – PPO | Source: Ambulatory Visit | Attending: Pulmonary Disease | Admitting: Pulmonary Disease

## 2013-12-09 DIAGNOSIS — Z87891 Personal history of nicotine dependence: Secondary | ICD-10-CM | POA: Diagnosis not present

## 2013-12-09 DIAGNOSIS — R0602 Shortness of breath: Secondary | ICD-10-CM | POA: Insufficient documentation

## 2013-12-09 LAB — PULMONARY FUNCTION TEST
DL/VA % pred: 110 %
DL/VA: 5.19 ml/min/mmHg/L
DLCO cor % pred: 66 %
DLCO cor: 22.34 ml/min/mmHg
DLCO unc % pred: 66 %
DLCO unc: 22.34 ml/min/mmHg
FEF 25-75 Post: 1.71 L/sec
FEF 25-75 Pre: 1.62 L/sec
FEF2575-%Change-Post: 5 %
FEF2575-%Pred-Post: 48 %
FEF2575-%Pred-Pre: 46 %
FEV1-%Change-Post: 0 %
FEV1-%Pred-Post: 56 %
FEV1-%Pred-Pre: 56 %
FEV1-Post: 2.29 L
FEV1-Pre: 2.27 L
FEV1FVC-%Change-Post: 5 %
FEV1FVC-%Pred-Pre: 96 %
FEV6-%Change-Post: -3 %
FEV6-%Pred-Post: 58 %
FEV6-%Pred-Pre: 60 %
FEV6-Post: 2.92 L
FEV6-Pre: 3.02 L
FEV6FVC-%Change-Post: 1 %
FEV6FVC-%Pred-Post: 104 %
FEV6FVC-%Pred-Pre: 103 %
FVC-%Change-Post: -4 %
FVC-%Pred-Post: 56 %
FVC-%Pred-Pre: 58 %
FVC-Post: 2.92 L
FVC-Pre: 3.06 L
Post FEV1/FVC ratio: 78 %
Post FEV6/FVC ratio: 100 %
Pre FEV1/FVC ratio: 74 %
Pre FEV6/FVC Ratio: 99 %
RV % pred: 85 %
RV: 1.82 L
TLC % pred: 71 %
TLC: 5.14 L

## 2013-12-09 MED ORDER — ALBUTEROL SULFATE (2.5 MG/3ML) 0.083% IN NEBU
2.5000 mg | INHALATION_SOLUTION | Freq: Once | RESPIRATORY_TRACT | Status: AC
  Administered 2013-12-09: 2.5 mg via RESPIRATORY_TRACT

## 2013-12-24 ENCOUNTER — Ambulatory Visit (HOSPITAL_COMMUNITY)
Admission: RE | Admit: 2013-12-24 | Discharge: 2013-12-24 | Disposition: A | Discharge status: Home / Self Care | Payer: BC Managed Care – PPO | Source: Ambulatory Visit | Attending: Pulmonary Disease | Admitting: Pulmonary Disease

## 2013-12-24 ENCOUNTER — Other Ambulatory Visit (HOSPITAL_COMMUNITY): Payer: Self-pay | Admitting: Pulmonary Disease

## 2013-12-24 DIAGNOSIS — R0602 Shortness of breath: Secondary | ICD-10-CM | POA: Insufficient documentation

## 2013-12-24 DIAGNOSIS — J90 Pleural effusion, not elsewhere classified: Secondary | ICD-10-CM | POA: Insufficient documentation

## 2013-12-24 DIAGNOSIS — J189 Pneumonia, unspecified organism: Secondary | ICD-10-CM

## 2013-12-27 ENCOUNTER — Encounter (HOSPITAL_COMMUNITY): Payer: Self-pay | Admitting: Emergency Medicine

## 2013-12-27 ENCOUNTER — Emergency Department (HOSPITAL_COMMUNITY): Payer: BC Managed Care – PPO

## 2013-12-27 ENCOUNTER — Inpatient Hospital Stay (HOSPITAL_COMMUNITY)
Admission: EM | Admit: 2013-12-27 | Discharge: 2013-12-29 | DRG: 186 | Disposition: A | Discharge status: Home / Self Care | Payer: BC Managed Care – PPO | Attending: Internal Medicine | Admitting: Internal Medicine

## 2013-12-27 DIAGNOSIS — G2581 Restless legs syndrome: Secondary | ICD-10-CM

## 2013-12-27 DIAGNOSIS — Z79899 Other long term (current) drug therapy: Secondary | ICD-10-CM

## 2013-12-27 DIAGNOSIS — Z23 Encounter for immunization: Secondary | ICD-10-CM | POA: Diagnosis not present

## 2013-12-27 DIAGNOSIS — E669 Obesity, unspecified: Secondary | ICD-10-CM | POA: Diagnosis present

## 2013-12-27 DIAGNOSIS — J189 Pneumonia, unspecified organism: Secondary | ICD-10-CM | POA: Diagnosis present

## 2013-12-27 DIAGNOSIS — J90 Pleural effusion, not elsewhere classified: Secondary | ICD-10-CM | POA: Diagnosis present

## 2013-12-27 DIAGNOSIS — K219 Gastro-esophageal reflux disease without esophagitis: Secondary | ICD-10-CM | POA: Diagnosis present

## 2013-12-27 DIAGNOSIS — Z6838 Body mass index (BMI) 38.0-38.9, adult: Secondary | ICD-10-CM

## 2013-12-27 DIAGNOSIS — B998 Other infectious disease: Secondary | ICD-10-CM

## 2013-12-27 DIAGNOSIS — R0989 Other specified symptoms and signs involving the circulatory and respiratory systems: Secondary | ICD-10-CM | POA: Diagnosis present

## 2013-12-27 DIAGNOSIS — R0609 Other forms of dyspnea: Secondary | ICD-10-CM | POA: Diagnosis present

## 2013-12-27 DIAGNOSIS — Z87891 Personal history of nicotine dependence: Secondary | ICD-10-CM

## 2013-12-27 DIAGNOSIS — B999 Unspecified infectious disease: Secondary | ICD-10-CM

## 2013-12-27 DIAGNOSIS — I10 Essential (primary) hypertension: Secondary | ICD-10-CM | POA: Diagnosis present

## 2013-12-27 DIAGNOSIS — R509 Fever, unspecified: Secondary | ICD-10-CM

## 2013-12-27 LAB — CBC WITH DIFFERENTIAL/PLATELET
Basophils Absolute: 0 10*3/uL (ref 0.0–0.1)
Basophils Relative: 0 % (ref 0–1)
Eosinophils Absolute: 0.9 10*3/uL — ABNORMAL HIGH (ref 0.0–0.7)
Eosinophils Relative: 11 % — ABNORMAL HIGH (ref 0–5)
HCT: 38.1 % — ABNORMAL LOW (ref 39.0–52.0)
Hemoglobin: 12.8 g/dL — ABNORMAL LOW (ref 13.0–17.0)
Lymphocytes Relative: 18 % (ref 12–46)
Lymphs Abs: 1.4 10*3/uL (ref 0.7–4.0)
MCH: 27.6 pg (ref 26.0–34.0)
MCHC: 33.6 g/dL (ref 30.0–36.0)
MCV: 82.1 fL (ref 78.0–100.0)
Monocytes Absolute: 0.5 10*3/uL (ref 0.1–1.0)
Monocytes Relative: 7 % (ref 3–12)
Neutro Abs: 5.1 10*3/uL (ref 1.7–7.7)
Neutrophils Relative %: 64 % (ref 43–77)
Platelets: 316 10*3/uL (ref 150–400)
RBC: 4.64 MIL/uL (ref 4.22–5.81)
RDW: 13 % (ref 11.5–15.5)
WBC: 8 10*3/uL (ref 4.0–10.5)

## 2013-12-27 LAB — COMPREHENSIVE METABOLIC PANEL
ALT: 36 U/L (ref 0–53)
AST: 27 U/L (ref 0–37)
Albumin: 3.3 g/dL — ABNORMAL LOW (ref 3.5–5.2)
Alkaline Phosphatase: 85 U/L (ref 39–117)
Anion gap: 11 (ref 5–15)
BUN: 15 mg/dL (ref 6–23)
CO2: 27 mEq/L (ref 19–32)
Calcium: 9.3 mg/dL (ref 8.4–10.5)
Chloride: 103 mEq/L (ref 96–112)
Creatinine, Ser: 0.82 mg/dL (ref 0.50–1.35)
GFR calc Af Amer: >90 mL/min (ref >90)
GFR calc non Af Amer: >90 mL/min (ref >90)
Glucose, Bld: 113 mg/dL — ABNORMAL HIGH (ref 70–99)
Potassium: 4.3 mEq/L (ref 3.7–5.3)
Sodium: 141 mEq/L (ref 137–147)
Total Bilirubin: 0.3 mg/dL (ref 0.3–1.2)
Total Protein: 7.8 g/dL (ref 6.0–8.3)

## 2013-12-27 LAB — TROPONIN I: Troponin I: <0.3 ng/mL (ref <0.30)

## 2013-12-27 MED ORDER — ONDANSETRON HCL 4 MG PO TABS: 4.0000 mg | ORAL_TABLET | Freq: Four times a day (QID) | ORAL | Status: DC | PRN

## 2013-12-27 MED ORDER — PIPERACILLIN-TAZOBACTAM 3.375 G IVPB
3.3750 g | Freq: Three times a day (TID) | INTRAVENOUS | Status: DC
  Administered 2013-12-27 – 2013-12-29 (×5): 3.375 g via INTRAVENOUS
  Filled 2013-12-27 (×7): qty 50

## 2013-12-27 MED ORDER — IOHEXOL 300 MG/ML  SOLN
80.0000 mL | Freq: Once | INTRAMUSCULAR | Status: AC | PRN
  Administered 2013-12-27: 80 mL via INTRAVENOUS

## 2013-12-27 MED ORDER — HYDROCODONE-ACETAMINOPHEN 7.5-325 MG PO TABS
1.0000 | ORAL_TABLET | Freq: Four times a day (QID) | ORAL | Status: DC | PRN
  Administered 2013-12-28 – 2013-12-29 (×5): 1 via ORAL
  Filled 2013-12-27 (×5): qty 1

## 2013-12-27 MED ORDER — POLYETHYLENE GLYCOL 3350 17 G PO PACK
17.0000 g | PACK | Freq: Every day | ORAL | Status: DC
  Administered 2013-12-28 – 2013-12-29 (×2): 17 g via ORAL
  Filled 2013-12-27 (×2): qty 1

## 2013-12-27 MED ORDER — IBUPROFEN 800 MG PO TABS
800.0000 mg | ORAL_TABLET | Freq: Once | ORAL | Status: AC
  Administered 2013-12-27: 800 mg via ORAL
  Filled 2013-12-27: qty 1

## 2013-12-27 MED ORDER — IBUPROFEN 400 MG PO TABS: 600.0000 mg | ORAL_TABLET | Freq: Four times a day (QID) | ORAL | Status: DC | PRN

## 2013-12-27 MED ORDER — PANTOPRAZOLE SODIUM 40 MG PO TBEC
40.0000 mg | DELAYED_RELEASE_TABLET | Freq: Every day | ORAL | Status: DC
  Administered 2013-12-28 – 2013-12-29 (×2): 40 mg via ORAL
  Filled 2013-12-27 (×2): qty 1

## 2013-12-27 MED ORDER — ROPINIROLE HCL 0.25 MG PO TABS
0.2500 mg | ORAL_TABLET | Freq: Every day | ORAL | Status: DC
  Administered 2013-12-27 – 2013-12-28 (×2): 0.25 mg via ORAL
  Filled 2013-12-27 (×5): qty 1

## 2013-12-27 MED ORDER — PIPERACILLIN-TAZOBACTAM 3.375 G IVPB 30 MIN
3.3750 g | Freq: Once | INTRAVENOUS | Status: AC
  Administered 2013-12-27: 3.375 g via INTRAVENOUS
  Filled 2013-12-27: qty 50

## 2013-12-27 MED ORDER — IBUPROFEN 600 MG PO TABS
600.0000 mg | ORAL_TABLET | Freq: Four times a day (QID) | ORAL | Status: DC | PRN
  Administered 2013-12-29: 600 mg via ORAL
  Filled 2013-12-27 (×2): qty 1

## 2013-12-27 MED ORDER — HEPARIN SODIUM (PORCINE) 5000 UNIT/ML IJ SOLN
5000.0000 [IU] | Freq: Three times a day (TID) | INTRAMUSCULAR | Status: DC
  Administered 2013-12-27 – 2013-12-29 (×4): 5000 [IU] via SUBCUTANEOUS
  Filled 2013-12-27 (×8): qty 1

## 2013-12-27 MED ORDER — LOSARTAN POTASSIUM 50 MG PO TABS
50.0000 mg | ORAL_TABLET | Freq: Every day | ORAL | Status: DC
  Administered 2013-12-28 – 2013-12-29 (×2): 50 mg via ORAL
  Filled 2013-12-27 (×7): qty 1

## 2013-12-27 MED ORDER — ONDANSETRON HCL 4 MG/2ML IJ SOLN: 4.0000 mg | Freq: Four times a day (QID) | INTRAMUSCULAR | Status: DC | PRN

## 2013-12-27 MED ORDER — VANCOMYCIN HCL IN DEXTROSE 1-5 GM/200ML-% IV SOLN
1000.0000 mg | Freq: Once | INTRAVENOUS | Status: AC
  Administered 2013-12-27: 1000 mg via INTRAVENOUS
  Filled 2013-12-27: qty 200

## 2013-12-27 MED ORDER — SODIUM CHLORIDE 0.9 % IV SOLN
1250.0000 mg | Freq: Two times a day (BID) | INTRAVENOUS | Status: DC
  Administered 2013-12-28 – 2013-12-29 (×3): 1250 mg via INTRAVENOUS
  Filled 2013-12-27 (×6): qty 1250

## 2013-12-27 NOTE — ED Notes (Signed)
Called 5W to notify jennifer RN that [facility] will not be arriving here at [facility] until approx. 1900.

## 2013-12-27 NOTE — H&P (Signed)
[facility] History and Physical  Colin Lee ZHY:[id number] DOB: 07-Sep-1960 DOA: 12/27/2013  Referring physician: ER. PCP: Bronson Curb, PA-C   Chief Complaint: Dyspnea.  HPI: Colin Lee is a 53 y.o. male  This is a 53 year old man who has a history of recent pneumonia who presents to the ER with onset for the last week or so of increasing dyspnea on exertion associated with subjective fever. He was diagnosed with community-acquired pneumonia in July 2015 with parapneumonic effusion. This required thoracentesis and there was recurrent effusion a couple weeks later, which again required thoracentesis. He was seen by pulmonology, Dr. Luan Pulling, who has told him that if the effusion should recur, he may require thoracic surgery. Evaluation in the emergency room shows recurrence of the left pleural effusion, shown by CT chest scan today. He is now being treated for further management.   Review of Systems:  Constitutional:  No weight loss, night sweats,  chills, fatigue.  HEENT:  No headaches, Difficulty swallowing,Tooth/dental problems,Sore throat,  No sneezing, itching, ear ache, nasal congestion, post nasal drip,  Cardio-vascular:  No chest pain, Orthopnea, PND, swelling in lower extremities, anasarca, dizziness, palpitations  GI:  No heartburn, indigestion, abdominal pain, nausea, vomiting, diarrhea, change in bowel habits, loss of appetite  Resp:   No excess mucus, no productive cough, No non-productive cough, No coughing up of blood.No change in color of mucus.No wheezing.No chest wall deformity  Skin:  no rash or lesions.  GU:  no dysuria, change in color of urine, no urgency or frequency. No flank pain.  Musculoskeletal:  No joint pain or swelling. No decreased range of motion. No back pain.  Psych:  No change in mood or affect. No depression or anxiety. No memory loss.   Past Medical History  Diagnosis Date  . GERD (gastroesophageal reflux  disease)   . Hypertension   . Shortness of breath     Starting May 2015   Past Surgical History  Procedure Laterality Date  . No prior surgery    . Thoracentesis Left 2015   Social History:  reports that he quit smoking about 13 years ago. His smoking use included Cigarettes. He has a 30 pack-year smoking history. He has never used smokeless tobacco. He reports that he drinks alcohol. He reports that he does not use illicit drugs.  No Known Allergies  Family History  Problem Relation Age of Onset  . Adopted: Yes     Prior to Admission medications   Medication Sig Start Date End Date Taking? Authorizing Provider  albuterol (PROVENTIL HFA;VENTOLIN HFA) 108 (90 BASE) MCG/ACT inhaler Inhale 2 puffs into the lungs every 6 (six) hours as needed for wheezing or shortness of breath.   Yes Historical Provider, MD  amoxicillin (AMOXIL) 500 MG capsule Take 500 mg by mouth 4 (four) times daily. Starting 12/21/2013 x 7 days.   Yes Historical Provider, MD  HYDROcodone-acetaminophen (NORCO) 7.5-325 MG per tablet Take 1 tablet by mouth every 6 (six) hours as needed (dental pain).   Yes Historical Provider, MD  ibuprofen (ADVIL,MOTRIN) 600 MG tablet Take 600 mg by mouth every 6 (six) hours as needed for moderate pain. pain   Yes Historical Provider, MD  losartan (COZAAR) 50 MG tablet Take 50 mg by mouth daily.   Yes Historical Provider, MD  omeprazole (PRILOSEC) 20 MG capsule Take 20 mg by mouth daily.   Yes Historical Provider, MD  polyethylene glycol (MIRALAX / GLYCOLAX) packet Take 17 g by mouth daily. 10/30/13  Yes Samuella Cota, MD  rOPINIRole (REQUIP) 0.25 MG tablet Take 1 tablet (0.25 mg total) by mouth at bedtime. 10/11/13  Yes Kathie Dike, MD   Physical Exam: Filed Vitals:   12/27/13 0843 12/27/13 1238 12/27/13 1300 12/27/13 1638  BP: 176/98 159/113 152/95 150/103  Pulse: 88 84 84 92  Temp: 98 F (36.7 C)   98.1 F (36.7 C)  TempSrc: Oral   Oral  Resp: 16   16  Height: 5\' 10"   (1.778 m)     Weight: 128.822 kg (284 lb)     SpO2: 95% 96% 95% 92%    Wt Readings from Last 3 Encounters:  12/27/13 128.822 kg (284 lb)  11/11/13 125.646 kg (277 lb)  10/27/13 125.9 kg (277 lb 9 oz)    General:  Appears calm and comfortable. No respiratory distress. No peripheral or central cyanosis. The patient is nontoxic or septic. Eyes: PERRL, normal lids, irises & conjunctiva ENT: grossly normal hearing, lips & tongue Neck: no LAD, masses or thyromegaly Cardiovascular: RRR, no m/r/g. No LE edema. Telemetry: SR, no arrhythmias  Respiratory: Dullness to percussion on the left mid and lower zones. No bronchial breathing. No wheezing or crackles. Abdomen: soft, ntnd Skin: no rash or induration seen on limited exam Musculoskeletal: grossly normal tone BUE/BLE Psychiatric: grossly normal mood and affect, speech fluent and appropriate Neurologic: grossly non-focal.          Labs on Admission:  Basic Metabolic Panel:  Recent Labs Lab 12/27/13 0855  NA 141  K 4.3  CL 103  CO2 27  GLUCOSE 113*  BUN 15  CREATININE 0.82  CALCIUM 9.3   Liver Function Tests:  Recent Labs Lab 12/27/13 0855  AST 27  ALT 36  ALKPHOS 85  BILITOT 0.3  PROT 7.8  ALBUMIN 3.3*   No results found for this basename: LIPASE, AMYLASE,  in the last 168 hours No results found for this basename: AMMONIA,  in the last 168 hours CBC:  Recent Labs Lab 12/27/13 0855  WBC 8.0  NEUTROABS 5.1  HGB 12.8*  HCT 38.1*  MCV 82.1  PLT 316   Cardiac Enzymes:  Recent Labs Lab 12/27/13 0855  TROPONINI <0.30    BNP (last 3 results)  Recent Labs  10/06/13 1942  PROBNP 79.8   CBG: No results found for this basename: GLUCAP,  in the last 168 hours  Radiological Exams on Admission: Dg Chest 2 View  12/27/2013   CLINICAL DATA:  Shortness of breath, former smoker, asthma.  EXAM: CHEST  2 VIEW  COMPARISON:  12/24/2013.  FINDINGS: Trachea is midline. Heart size stable. Lungs are hyperinflated  with volume loss at the lung bases. Moderate left pleural effusion may have increased slightly in the interval. Small right pleural effusion, stable.  IMPRESSION: 1. Moderate left pleural effusion may have increased slightly in the interval. 2. Small right pleural effusion, stable. 3. Associated volume loss in the lung bases, stable.   Electronically Signed   By: Lorin Picket M.D.   On: 12/27/2013 09:17   Ct Chest W Contrast  12/27/2013   CLINICAL DATA:  Shortness of breath for the past week.  EXAM: CT CHEST WITH CONTRAST  TECHNIQUE: Multidetector CT imaging of the chest was performed during intravenous contrast administration.  CONTRAST:  33mL OMNIPAQUE IOHEXOL 300 MG/ML  SOLN  COMPARISON:  Chest radiographs obtained earlier today. Chest CT dated 10/06/2013.  FINDINGS: Moderate-sized left pleural effusion with a decrease in amount since 10/06/2013. Interval small right  pleural effusion. No significant pericardial fluid. Bilateral upper lobe bullous changes. Mild bilateral lower lobe atelectasis, greater on the left. No lung nodules or enlarged lymph nodes.  Multiple gallstones in the gallbladder without gallbladder wall thickening or pericholecystic fluid. The largest stone measures 4 mm in maximum diameter. Rounded areas of low density in the liver have not changed significantly with appearances compatible with cysts. The largest is centrally located, measuring 2.7 cm in maximum diameter. Thoracic spine degenerative changes.  IMPRESSION: 1. Moderate-sized left pleural effusion with a mild decreased in amount since 10/06/2013 an interval small right pleural effusion. 2. Mild bilateral lower lobe atelectasis, greater on the left. 3. COPD. 4. Cholelithiasis.   Electronically Signed   By: Enrique Sack M.D.   On: 12/27/2013 13:48      Assessment/Plan   1. Left recurrent pleural effusion despite thoracentesis previously. Parapneumonic effusion. 2. Hypertension.  Plan: 1. Admit to medical floor, [facility].  2. Thoracic surgery consultation with a view to VATS procedure.  Further recommendations will depend on patient's hospital progress.   Code Status: Full code.  DVT Prophylaxis: Heparin.  Family Communication: I discussed the plan with patient at the bedside.   Disposition Plan: Home when medically stable.   Time spent: 60 minutes.  Doree Albee [facility] Pager [phone].

## 2013-12-27 NOTE — ED Notes (Signed)
[facility] here to transport patient. 

## 2013-12-27 NOTE — ED Notes (Signed)
Paged Dr. Luan Pulling to [phone]

## 2013-12-27 NOTE — ED Provider Notes (Signed)
CSN: [id number]     Arrival date & time 12/27/13  8938 History   This chart was scribed for Colin Diego, MD by Zola Button, ED Scribe. This patient was seen in room APA04/APA04 and the patient's care was started at 8:43 AM.   Chief Complaint  Patient presents with  . Shortness of Breath   Patient is a 53 y.o. male presenting with shortness of breath. The history is provided by the patient. No language interpreter was used.  Shortness of Breath Severity:  Moderate Onset quality:  Gradual Duration:  1 week Timing:  Constant Progression:  Worsening Chronicity:  New Worsened by:  Exertion Associated symptoms: fever   Associated symptoms: no abdominal pain, no chest pain, no cough, no headaches and no rash     HPI Comments: Colin Lee is a 53 y.o. male with a hx of recent PNA who presents to the Emergency Department complaining of gradual onset, constant, moderate SOB that began this past week. Patient notes associated subjective fever that started 2 weeks ago. He states his SOB is worse with exertion.  He was diagnosed with pneumonia in July 2015 which required a 1 week hospitalization. He reports also requiring a thoracentesis at that time and again 2 week afterwards. He was prescribed Augmentin which he has completed. He reports having periodic CXR's monitor his lungs; he states the last CXR was performed 3 days ago.   PCP [place], [place]   Past Medical History  Diagnosis Date  . GERD (gastroesophageal reflux disease)   . Hypertension   . Shortness of breath     Starting May 2015   Past Surgical History  Procedure Laterality Date  . No prior surgery    . Thoracentesis Left 2015   Family History  Problem Relation Age of Onset  . Adopted: Yes   History  Substance Use Topics  . Smoking status: Former Smoker -- 1.00 packs/day for 30 years    Types: Cigarettes    Quit date: 04/08/2000  . Smokeless tobacco: Never Used  . Alcohol Use: Yes    Comment: rare    Review of Systems  Constitutional: Positive for fever. Negative for appetite change and fatigue.  HENT: Negative for congestion, ear discharge and sinus pressure.   Eyes: Negative for discharge.  Respiratory: Positive for shortness of breath. Negative for cough.   Cardiovascular: Negative for chest pain.  Gastrointestinal: Negative for abdominal pain and diarrhea.  Genitourinary: Negative for frequency and hematuria.  Musculoskeletal: Negative for back pain.  Skin: Negative for rash.  Neurological: Negative for seizures and headaches.  Psychiatric/Behavioral: Negative for hallucinations.   Allergies  Review of patient's allergies indicates no known allergies.  Home Medications   Prior to Admission medications   Medication Sig Start Date End Date Taking? Authorizing Provider  ibuprofen (ADVIL,MOTRIN) 600 MG tablet Take 600 mg by mouth every 6 (six) hours as needed. pain    Historical Provider, MD  losartan (COZAAR) 50 MG tablet Take 50 mg by mouth daily.    Historical Provider, MD  omeprazole (PRILOSEC) 20 MG capsule Take 20 mg by mouth daily.    Historical Provider, MD  oxyCODONE (OXY IR/ROXICODONE) 5 MG immediate release tablet Take 1 tablet (5 mg total) by mouth every 4 (four) hours as needed for moderate pain. 10/30/13   Samuella Cota, MD  polyethylene glycol [facility] / Floria Raveling) packet Take 17 g by mouth daily. 10/30/13   Samuella Cota, MD  rOPINIRole (REQUIP) 0.25 MG  tablet Take 1 tablet (0.25 mg total) by mouth at bedtime. 10/11/13   Kathie Dike, MD   There were no vitals taken for this visit. Physical Exam  Nursing note and vitals reviewed. Constitutional: He is oriented to person, place, and time. He appears well-developed.  HENT:  Head: Normocephalic.  Eyes: Conjunctivae and EOM are normal. No scleral icterus.  Neck: Neck supple. No thyromegaly present.  Cardiovascular: Normal rate, regular rhythm and normal heart sounds.  Exam reveals no gallop  and no friction rub.   No murmur heard. Pulmonary/Chest: Breath sounds normal. No stridor. He has no wheezes. He has no rales. He exhibits no tenderness.  Abdominal: He exhibits no distension. There is tenderness (minimal tenderness LUQ ). There is no rebound.  Musculoskeletal: Normal range of motion. He exhibits no edema.  Lymphadenopathy:    He has no cervical adenopathy.  Neurological: He is oriented to person, place, and time. He exhibits normal muscle tone. Coordination normal.  Skin: No rash noted. No erythema.  Psychiatric: He has a normal mood and affect. His behavior is normal.    ED Course  Procedures  DIAGNOSTIC STUDIES: Oxygen Saturation is 95% on RA, nml by my interpretation.    COORDINATION OF CARE: 8:47 AM-Discussed treatment plan which includes labs, EKG and CXR with pt at bedside and pt agreed to plan.   Labs Review Labs Reviewed  CBC WITH DIFFERENTIAL - Abnormal; Notable for the following:    Hemoglobin 12.8 (*)    HCT 38.1 (*)    Eosinophils Relative 11 (*)    Eosinophils Absolute 0.9 (*)    All other components within normal limits  COMPREHENSIVE METABOLIC PANEL  TROPONIN I    Imaging Review No results found.   EKG Interpretation None      MDM   Final diagnoses:  None   Pleural effusion.  admit     Colin Diego, MD 12/27/13 [phone]

## 2013-12-27 NOTE — ED Notes (Signed)
Pt. Wanting to talk wit EDP for update, EDP aware.

## 2013-12-27 NOTE — ED Notes (Signed)
MD at bedside. Gosrani in to see pt.

## 2013-12-27 NOTE — ED Notes (Signed)
Called Dr.Fagan and transferred to [place]

## 2013-12-27 NOTE — ED Notes (Signed)
Meal tray ordered as pt. Will be admitted.

## 2013-12-27 NOTE — ED Notes (Signed)
Called Dr. Willey Blade for consult; transferred to Dr. Roderic Palau

## 2013-12-27 NOTE — ED Notes (Signed)
Pt c/o increasing sob x 1 week with exertion. Pt also c/o pain in both lower lungs. Pt was admitted with PNE in July and had thoracentesis x 2.

## 2013-12-27 NOTE — Progress Notes (Signed)
ANTIBIOTIC CONSULT NOTE - INITIAL  Pharmacy Consult for Vancomycin & Zosyn Indication: pneumonia  No Known Allergies  Patient Measurements: Height: 5\' 10"  (177.8 cm) Weight: 284 lb (128.822 kg) IBW/kg (Calculated) : 73 Adjusted Body Weight:   Vital Signs: Temp: 98.1 F (36.7 C) (09/21 1638) Temp src: Oral (09/21 1638) BP: 150/103 mmHg (09/21 1638) Pulse Rate: 92 (09/21 1638) Intake/Output from previous day:   Intake/Output from this shift:    Labs:  Recent Labs  12/27/13 0855  WBC 8.0  HGB 12.8*  PLT 316  CREATININE 0.82   Estimated Creatinine Clearance: 143.7 ml/min (by C-G formula based on Cr of 0.82). No results found for this basename: VANCOTROUGH, VANCOPEAK, VANCORANDOM, GENTTROUGH, GENTPEAK, GENTRANDOM, TOBRATROUGH, TOBRAPEAK, TOBRARND, AMIKACINPEAK, AMIKACINTROU, AMIKACIN,  in the last 72 hours   Microbiology: No results found for this or any previous visit (from the past 720 hour(s)).  Medical History: Past Medical History  Diagnosis Date  . GERD (gastroesophageal reflux disease)   . Hypertension   . Shortness of breath     Starting May 2015    Medications:  Scheduled:  . heparin  5,000 Units Subcutaneous 3 times per day  . losartan  50 mg Oral Daily  . pantoprazole  40 mg Oral Daily  . polyethylene glycol  17 g Oral Daily  . rOPINIRole  0.25 mg Oral QHS   Assessment: Vancomycin & Zosyn per pharmacy protocol for CAP Vancomycin 1 GM and Zosyn 3.375 GM IV given in ED Calculated Normalized CrCl 108 ml/min   Goal of Therapy:  Vancomycin trough level 15-20 mcg/ml  Plan:  Vancomycin 1 GM IV in addition to Vancomycin 1 GM given in ED Then Vancomycin 1250 mg IV every 12 hours Zosyn 3.375 GM IV every 8 hours, infuse each dose over 4 hours Vancomycin trough at steady state Monitor renal function Labs per protocol  Abner Greenspan, Neta Upadhyay Bennett 12/27/2013,5:04 PM

## 2013-12-27 NOTE — ED Notes (Signed)
[facility] called, it will another 45 minutes before they get here.

## 2013-12-27 NOTE — ED Notes (Signed)
[facility] called  

## 2013-12-28 ENCOUNTER — Inpatient Hospital Stay (HOSPITAL_COMMUNITY): Payer: BC Managed Care – PPO

## 2013-12-28 DIAGNOSIS — J90 Pleural effusion, not elsewhere classified: Secondary | ICD-10-CM

## 2013-12-28 DIAGNOSIS — G2581 Restless legs syndrome: Secondary | ICD-10-CM

## 2013-12-28 HISTORY — DX: Restless legs syndrome: G25.81

## 2013-12-28 LAB — TSH: TSH: 1.41 u[IU]/mL (ref 0.350–4.500)

## 2013-12-28 MED ORDER — ALBUTEROL SULFATE (2.5 MG/3ML) 0.083% IN NEBU
2.5000 mg | INHALATION_SOLUTION | RESPIRATORY_TRACT | Status: DC | PRN
  Administered 2013-12-28: 2.5 mg via RESPIRATORY_TRACT
  Filled 2013-12-28: qty 3

## 2013-12-28 MED ORDER — INFLUENZA VAC SPLIT QUAD 0.5 ML IM SUSY
0.5000 mL | PREFILLED_SYRINGE | INTRAMUSCULAR | Status: AC
  Administered 2013-12-29: 0.5 mL via INTRAMUSCULAR
  Filled 2013-12-28: qty 0.5

## 2013-12-28 MED ORDER — LIDOCAINE HCL (PF) 1 % IJ SOLN
INTRAMUSCULAR | Status: AC
  Filled 2013-12-28: qty 10

## 2013-12-28 NOTE — Consult Note (Signed)
[address]       [place] [address]             [phone]        [facility] Medical Record #[id number] Date of Birth: 06-02-1960  Referring: Shauna Hugh Primary Care: Mac[facility]  Chief Complaint:    Chief Complaint  Patient presents with  . Shortness of Breath    History of Present Illness:    Colin Lee is a 53 yo obese male who presented to [facility] Emergency Department with complaints of increasing dyspnea on exertion for the past week.  This was also associated with fevers.  He was recently admitted to the hospital in July and treated for CAP.  He was found to have a parapneumonic effusion at that time and underwent Thoracentesis on 2 separate occasions.  Cultures were obtained and were negative.  Fluid cytology showed reactive Mesothelial cells.  He had no further follow up after this episode.  Workup in the ED again showed a left sided pleural effusion which was confirmed via CT scan.  It was felt the patient should be admitted and transferred to [facility] for further care.  Currently the patient feels okay.  He does have some mild shortness of breath, but states this mainly occurs with exertion.  He does admit to fevers and night sweats.  He also states that after being weighed in the hospital he realized he has lost 13 pounds.  He admits to a productive cough with brownish sputum, but denies hemoptysis.  [facility] consult has been requested .   Current Activity/ Functional Status: Patient is independent with mobility/ambulation, transfers, ADL's, IADL's.   Zubrod Score: At the time of surgery this patient's most appropriate activity status/level should be described as: [x]     0    Normal activity, no symptoms []     1    Restricted in physical strenuous activity but ambulatory, able to do out light work []     2    Ambulatory and capable of self care, unable to do work activities, up and about                 more than  50%  Of the time                            []     3    Only limited self care, in bed greater than 50% of waking hours []     4    Completely disabled, no self care, confined to bed or chair []     5    Moribund  Past Medical History  Diagnosis Date  . GERD (gastroesophageal reflux disease)   . Hypertension   . Shortness of breath     Starting May 2015    Past Surgical History  Procedure Laterality Date  . No prior surgery    . Thoracentesis Left 2015    History  Smoking status  . Former Smoker -- 1.00 packs/day for 30 years  . Types: Cigarettes  . Quit date: 04/08/2000  Smokeless tobacco  . Never Used    History  Alcohol Use  . Yes    Comment: rare    History   Social History  . Marital Status: Married    Spouse Name: N/A    Number of Children: N/A  . Years of Education: N/A   Occupational  History  . Not on file.   Social History Main Topics  . Smoking status: Former Smoker -- 1.00 packs/day for 30 years    Types: Cigarettes    Quit date: 04/08/2000  . Smokeless tobacco: Never Used  . Alcohol Use: Yes     Comment: rare  . Drug Use: No  . Sexual Activity: Not on file   Other Topics Concern  . Not on file   Social History Narrative  . No narrative on file    No Known Allergies  Current Facility-Administered Medications  Medication Dose Route Frequency Provider Last Rate Last Dose  . heparin injection 5,000 Units  5,000 Units Subcutaneous 3 times per day Doree Albee, MD   5,000 Units at 12/28/13 0515  . HYDROcodone-acetaminophen (NORCO) 7.5-325 MG per tablet 1 tablet  1 tablet Oral Q6H PRN Doree Albee, MD   1 tablet at 12/28/13 0515  . ibuprofen (ADVIL,MOTRIN) tablet 600 mg  600 mg Oral Q6H PRN Doree Albee, MD      . Derrill Memo ON 12/29/2013] Influenza vac split quadrivalent PF (FLUARIX) injection 0.5 mL  0.5 mL Intramuscular Tomorrow-1000 Nita Sells, MD      . losartan (COZAAR) tablet 50 mg  50 mg Oral Daily Nimish C Gosrani, MD       . ondansetron (ZOFRAN) tablet 4 mg  4 mg Oral Q6H PRN Nimish Luther Parody, MD       Or  . ondansetron (ZOFRAN) injection 4 mg  4 mg Intravenous Q6H PRN Nimish C Gosrani, MD      . pantoprazole (PROTONIX) EC tablet 40 mg  40 mg Oral Daily Nimish C Gosrani, MD      . piperacillin-tazobactam (ZOSYN) IVPB 3.375 g  3.375 g Intravenous Q8H Nimish C Gosrani, MD   3.375 g at 12/28/13 0745  . polyethylene glycol (MIRALAX / GLYCOLAX) packet 17 g  17 g Oral Daily Nimish C Gosrani, MD      . rOPINIRole (REQUIP) tablet 0.25 mg  0.25 mg Oral QHS Nimish C Gosrani, MD   0.25 mg at 12/27/13 2146  . vancomycin (VANCOCIN) 1,250 mg in sodium chloride 0.9 % 250 mL IVPB  1,250 mg Intravenous Q12H Nimish C Gosrani, MD   1,250 mg at 12/28/13 0503    Prescriptions prior to admission  Medication Sig Dispense Refill  . albuterol (PROVENTIL HFA;VENTOLIN HFA) 108 (90 BASE) MCG/ACT inhaler Inhale 2 puffs into the lungs every 6 (six) hours as needed for wheezing or shortness of breath.      Marland Kitchen amoxicillin (AMOXIL) 500 MG capsule Take 500 mg by mouth 4 (four) times daily. Starting 12/21/2013 x 7 days.      Marland Kitchen HYDROcodone-acetaminophen (NORCO) 7.5-325 MG per tablet Take 1 tablet by mouth every 6 (six) hours as needed (dental pain).      Marland Kitchen ibuprofen (ADVIL,MOTRIN) 600 MG tablet Take 600 mg by mouth every 6 (six) hours as needed for moderate pain. pain      . losartan (COZAAR) 50 MG tablet Take 50 mg by mouth daily.      Marland Kitchen omeprazole (PRILOSEC) 20 MG capsule Take 20 mg by mouth daily.      . polyethylene glycol (MIRALAX / GLYCOLAX) packet Take 17 g by mouth daily.  30 each  0  . rOPINIRole (REQUIP) 0.25 MG tablet Take 1 tablet (0.25 mg total) by mouth at bedtime.  30 tablet  0    Family History  Problem Relation Age of Onset  . Adopted:  Yes     Review of Systems:     Cardiac Review of Systems: Y or N  Chest Pain [    ]  Resting SOB [ y  ] Exertional SOB  [ y ]  Orthopnea [  ]   Pedal Edema [   ]    Palpitations [   ] Syncope  [  ]   Presyncope [   ]  General Review of Systems: [Y] = yes [  ]=no Constitional: recent weight change Blue.Reese  ]; anorexia [  ]; fatigue [  ]; nausea [  ]; night sweats [ y ]; fever Blue.Reese  ]; or chills [  ]                                                               Dental: poor dentition[  ]; Last Dentist visit:   Eye : blurred vision [  ]; diplopia [   ]; vision changes [  ];  Amaurosis fugax[  ]; Resp: cough Blue.Reese  ];  wheezing[  ];  hemoptysis[n  ]; shortness of breath[ y ]; paroxysmal nocturnal dyspnea[  ]; dyspnea on exertion[y  ]; or orthopnea[  ];  GI:  gallstones[  ], vomiting[  ];  dysphagia[  ]; melena[  ];  hematochezia [  ]; heartburn[  ];   Hx of  Colonoscopy[  ]; GU: kidney stones [  ]; hematuria[  ];   dysuria [  ];  nocturia[  ];  history of     obstruction [  ]; urinary frequency [  ]             Skin: rash, swelling[ n ];, hair loss[  ];  peripheral edema[  ];  or itching[  ]; Musculosketetal: myalgias[  ];  joint swelling[  ];  joint erythema[  ];  joint pain[  ];  back pain[  ];  Heme/Lymph: bruising[  ];  bleeding[  ];  anemia[  ];  Neuro: TIA[  ];  headaches[  ];  stroke[  ];  vertigo[  ];  seizures[  ];   paresthesias[  ];  difficulty walking[  ];  Psych:depression[  ]; anxiety[  ];  Endocrine: diabetes[  ];  thyroid dysfunction[  ];  Immunizations: Flu Blue.Reese  ]; Pneumococcal[  ];  Other:  Physical Exam: BP 161/92  Pulse 82  Temp(Src) 98.7 F (37.1 C) (Oral)  Resp 16  Ht 5\' 10"  (1.778 m)  Wt 271 lb 11.2 oz (123.242 kg)  BMI 38.98 kg/m2  SpO2 95%  General appearance: alert, cooperative and moderately obese Neurologic: intact Heart: regular rate and rhythm Lungs: diminished breath sounds left base Abdomen: soft, non-tender; bowel sounds normal; no masses,  no organomegaly   Diagnostic Studies & Laboratory data:     Recent Radiology Findings:   Dg Chest 2 View  12/27/2013   CLINICAL DATA:  Shortness of breath, former smoker, asthma.  EXAM: CHEST  2 VIEW   COMPARISON:  12/24/2013.  FINDINGS: Trachea is midline. Heart size stable. Lungs are hyperinflated with volume loss at the lung bases. Moderate left pleural effusion may have increased slightly in the interval. Small right pleural effusion, stable.  IMPRESSION: 1. Moderate left pleural effusion may have increased slightly in the interval.  2. Small right pleural effusion, stable. 3. Associated volume loss in the lung bases, stable.   Electronically Signed   By: Lorin Picket M.D.   On: 12/27/2013 09:17   Ct Chest W Contrast  12/27/2013   CLINICAL DATA:  Shortness of breath for the past week.  EXAM: CT CHEST WITH CONTRAST  TECHNIQUE: Multidetector CT imaging of the chest was performed during intravenous contrast administration.  CONTRAST:  67mL OMNIPAQUE IOHEXOL 300 MG/ML  SOLN  COMPARISON:  Chest radiographs obtained earlier today. Chest CT dated 10/06/2013.  FINDINGS: Moderate-sized left pleural effusion with a decrease in amount since 10/06/2013. Interval small right pleural effusion. No significant pericardial fluid. Bilateral upper lobe bullous changes. Mild bilateral lower lobe atelectasis, greater on the left. No lung nodules or enlarged lymph nodes.  Multiple gallstones in the gallbladder without gallbladder wall thickening or pericholecystic fluid. The largest stone measures 4 mm in maximum diameter. Rounded areas of low density in the liver have not changed significantly with appearances compatible with cysts. The largest is centrally located, measuring 2.7 cm in maximum diameter. Thoracic spine degenerative changes.  IMPRESSION: 1. Moderate-sized left pleural effusion with a mild decreased in amount since 10/06/2013 an interval small right pleural effusion. 2. Mild bilateral lower lobe atelectasis, greater on the left. 3. COPD. 4. Cholelithiasis.   Electronically Signed   By: Enrique Sack M.D.   On: 12/27/2013 13:48      Recent Lab Findings: Lab Results  Component Value Date   WBC 8.0 12/27/2013     HGB 12.8* 12/27/2013   HCT 38.1* 12/27/2013   PLT 316 12/27/2013   GLUCOSE 113* 12/27/2013   ALT 36 12/27/2013   AST 27 12/27/2013   NA 141 12/27/2013   K 4.3 12/27/2013   CL 103 12/27/2013   CREATININE 0.82 12/27/2013   BUN 15 12/27/2013   CO2 27 12/27/2013   TSH 1.410 12/27/2013   HGBA1C 6.1* 10/06/2013   Assessment / Plan:      1. Recurrent Left Pleural Effusion-  Thoracentesis done today 1500 ml straw colored fluid removed today with Cytology and Cultures pending but doubt infected, does not appear to be empyema follow up chest xray in am  2. Episodic fevers per patient unknown cause- will need evaluation   Grace Isaac MD      [address] [place] [address] Office [phone]   Beeper [phone]

## 2013-12-28 NOTE — Procedures (Signed)
Successful US guided left thoracentesis. Yielded 1.4L of clear yellow fluid. Pt tolerated procedure well. No immediate complications.  Specimen was sent for labs. CXR ordered.  Ascencion Dike PA-C 12/28/2013 11:26 AM

## 2013-12-28 NOTE — Progress Notes (Addendum)
PROGRESS NOTE  Colin Lee NKN:[id number] DOB: Nov 17, 1960 DOA: 12/27/2013 PCP: Bronson Curb, PA-C  HPI/Subjective: Pt is 53 yo male with a history of recent pneumonia with recurrent parapneumonic effusion who was admitted for a recurrent pleural effusion.  On 9/22, he states he feels SOB, but no more than he has in the past week.  He has been coughing up brown sputum.  Complains of pleuritic chest pain.     Assessment/Plan: Recurrent Pleural Effusion - Parapneumonic effusion first noticed in July 2015.  Patient has had thoracentesis performed twice, has been followed by Dr. Luan Pulling (pulmonologist) and now presents with recurrence. - Consulted CVTS who recommends thoracentesis today with cytology and cultures.  - IR was consulted and performed thoracentesis, drawing off 1.4 liters.  Fluid was sent for labs.  Healthcare Associated Pneumonia - With pleural effusion recurrence, pt is being treated with Zosyn and Vancomycin (initiated 9/21).   - Appears to be afebrile and without leukocytosis.  Will monitor for another 24 hours before discontinuing antibiotics. - History of pneumonia in July 2015.  Pleural effusion was presumed parapneumonic effusion.  Hypertension - Chronic.  BP has been slightly elevated during admission. - Continue to Cozaar.  Continue to monitor and consider increasing dose of Cozaar if persistently elevated.  Fever - Currently afebrile.  Treat with Motrin if recurs.  Restless Leg Syndrome - Continue Requip   DVT Prophylaxis:  heparin  Code Status: FULL  Family Communication: No family at bedside.  Pt is awake, alert, and agreeable to plan Disposition Plan: Home when medically stable.  Consultants:  Thoracic surgery  Pharmacy  Procedures:  Thoracentesis performed on 9/22.  Antibiotics: Anti-infectives   Start     Dose/Rate Route Frequency Ordered Stop   12/28/13 0600  vancomycin (VANCOCIN) 1,250 mg in sodium chloride 0.9 % 250 mL IVPB      1,250 mg 166.7 mL/hr over 90 Minutes Intravenous Every 12 hours 12/27/13 1702     12/27/13 2200  piperacillin-tazobactam (ZOSYN) IVPB 3.375 g     3.375 g 12.5 mL/hr over 240 Minutes Intravenous Every 8 hours 12/27/13 1702     12/27/13 1900  vancomycin (VANCOCIN) IVPB 1000 mg/200 mL premix     1,000 mg 200 mL/hr over 60 Minutes Intravenous  Once 12/27/13 1702 12/27/13 2208   12/27/13 1430  piperacillin-tazobactam (ZOSYN) IVPB 3.375 g     3.375 g 100 mL/hr over 30 Minutes Intravenous  Once 12/27/13 1422 12/27/13 1501   12/27/13 1430  vancomycin (VANCOCIN) IVPB 1000 mg/200 mL premix     1,000 mg 200 mL/hr over 60 Minutes Intravenous  Once 12/27/13 1422 12/27/13 1601      Objective: Filed Vitals:   12/27/13 2302 12/28/13 0601 12/28/13 1108 12/28/13 1124  BP: 147/83 161/92 157/95 141/87  Pulse: 83 82    Temp: 98.4 F (36.9 C) 98.7 F (37.1 C)    TempSrc: Oral Oral    Resp: 16 16    Height:      Weight:      SpO2: 97% 95%      Intake/Output Summary (Last 24 hours) at 12/28/13 1327 Last data filed at 12/28/13 0520  Gross per 24 hour  Intake    490 ml  Output   1500 ml  Net  -1010 ml   Filed Weights   12/27/13 0843 12/27/13 2009  Weight: 128.822 kg (284 lb) 123.242 kg (271 lb 11.2 oz)    Exam: General: Well developed, well nourished, appears comfortable at rest  seated in chair.  Appears stated age  60:  Anicteic Sclera, MMM.  Neck: Supple, no JVD, no masses  Cardiovascular: RRR, S1 S2 auscultated, no rubs, murmurs or gallops.   Respiratory: Diminished breath sounds in the bases of lungs bilaterally.  Equal chest rise.  Dullness to percussion in lung bases.  Abdomen: Soft, nontender, nondistended, + bowel sounds  Extremities: Warm dry without cyanosis clubbing or edema. 2+ pulses in all 4 extremities. Neuro: AAOx3, cranial nerves grossly intact. Strength 5/5 in upper and lower extremities  Psych: Normal affect and demeanor with intact judgement and insight   Data  Reviewed: Basic Metabolic Panel:  Recent Labs Lab 12/27/13 0855  NA 141  K 4.3  CL 103  CO2 27  GLUCOSE 113*  BUN 15  CREATININE 0.82  CALCIUM 9.3   Liver Function Tests:  Recent Labs Lab 12/27/13 0855  AST 27  ALT 36  ALKPHOS 85  BILITOT 0.3  PROT 7.8  ALBUMIN 3.3*   CBC:  Recent Labs Lab 12/27/13 0855  WBC 8.0  NEUTROABS 5.1  HGB 12.8*  HCT 38.1*  MCV 82.1  PLT 316   Cardiac Enzymes:  Recent Labs Lab 12/27/13 0855  TROPONINI <0.30   BNP (last 3 results)  Recent Labs  10/06/13 1942  PROBNP 79.8   Studies: Dg Chest 1 View  12/28/2013   CLINICAL DATA:  Status post left thoracentesis  EXAM: CHEST - 1 VIEW  COMPARISON:  December 27, 2013  FINDINGS: The heart size and mediastinal contours are within normal limits. There are small bilateral pleural effusions. The left pleural effusion is smaller compared to prior exam. There is no pneumothorax. The visualized skeletal structures are stable.  IMPRESSION: Small bilateral pleural effusions. The left pleural effusion is smaller compared to prior exam. There is no pneumothorax.   Electronically Signed   By: Abelardo Diesel M.D.   On: 12/28/2013 12:13   Dg Chest 2 View  12/27/2013   CLINICAL DATA:  Shortness of breath, former smoker, asthma.  EXAM: CHEST  2 VIEW  COMPARISON:  12/24/2013.  FINDINGS: Trachea is midline. Heart size stable. Lungs are hyperinflated with volume loss at the lung bases. Moderate left pleural effusion may have increased slightly in the interval. Small right pleural effusion, stable.  IMPRESSION: 1. Moderate left pleural effusion may have increased slightly in the interval. 2. Small right pleural effusion, stable. 3. Associated volume loss in the lung bases, stable.   Electronically Signed   By: Lorin Picket M.D.   On: 12/27/2013 09:17   Ct Chest W Contrast  12/27/2013   CLINICAL DATA:  Shortness of breath for the past week.  EXAM: CT CHEST WITH CONTRAST  TECHNIQUE: Multidetector CT imaging  of the chest was performed during intravenous contrast administration.  CONTRAST:  79mL OMNIPAQUE IOHEXOL 300 MG/ML  SOLN  COMPARISON:  Chest radiographs obtained earlier today. Chest CT dated 10/06/2013.  FINDINGS: Moderate-sized left pleural effusion with a decrease in amount since 10/06/2013. Interval small right pleural effusion. No significant pericardial fluid. Bilateral upper lobe bullous changes. Mild bilateral lower lobe atelectasis, greater on the left. No lung nodules or enlarged lymph nodes.  Multiple gallstones in the gallbladder without gallbladder wall thickening or pericholecystic fluid. The largest stone measures 4 mm in maximum diameter. Rounded areas of low density in the liver have not changed significantly with appearances compatible with cysts. The largest is centrally located, measuring 2.7 cm in maximum diameter. Thoracic spine degenerative changes.  IMPRESSION: 1. Moderate-sized left  pleural effusion with a mild decreased in amount since 10/06/2013 an interval small right pleural effusion. 2. Mild bilateral lower lobe atelectasis, greater on the left. 3. COPD. 4. Cholelithiasis.   Electronically Signed   By: Enrique Sack M.D.   On: 12/27/2013 13:48    Scheduled Meds: . heparin  5,000 Units Subcutaneous 3 times per day  . [START ON 12/29/2013] Influenza vac split quadrivalent PF  0.5 mL Intramuscular Tomorrow-1000  . lidocaine (PF)      . losartan  50 mg Oral Daily  . pantoprazole  40 mg Oral Daily  . piperacillin-tazobactam (ZOSYN)  IV  3.375 g Intravenous Q8H  . polyethylene glycol  17 g Oral Daily  . rOPINIRole  0.25 mg Oral QHS  . vancomycin  1,250 mg Intravenous Q12H   Continuous Infusions:   Principal Problem:   Pleural effusion Active Problems:   Hypertension   Fever   HCAP (healthcare-associated pneumonia)   Restless leg syndrome   Rockwell Germany, PA-S2 Imogene Burn, PA-C  [facility] Pager [phone]. If 7PM-7AM, please contact night-coverage at  [email or web address], password [facility] 12/28/2013, 1:27 PM  LOS: 1 day    Patient was seen, examined,treatment plan was discussed with the Physician extender. I have directly reviewed the clinical findings, lab, imaging studies and management of this patient in detail. I have made the necessary changes to the above noted documentation, and agree with the documentation, as recorded by the Physician extender.  Phillips Climes MD [facility].

## 2013-12-28 NOTE — Progress Notes (Signed)
Utilization review completed.  

## 2013-12-29 ENCOUNTER — Inpatient Hospital Stay (HOSPITAL_COMMUNITY): Payer: BC Managed Care – PPO

## 2013-12-29 DIAGNOSIS — I369 Nonrheumatic tricuspid valve disorder, unspecified: Secondary | ICD-10-CM

## 2013-12-29 DIAGNOSIS — G2581 Restless legs syndrome: Secondary | ICD-10-CM

## 2013-12-29 DIAGNOSIS — I10 Essential (primary) hypertension: Secondary | ICD-10-CM

## 2013-12-29 LAB — SEDIMENTATION RATE: Sed Rate: 37 mm/hr — ABNORMAL HIGH (ref 0–16)

## 2013-12-29 LAB — LIPID PANEL
Cholesterol: 145 mg/dL (ref 0–200)
HDL: 25 mg/dL — ABNORMAL LOW (ref >39)
LDL Cholesterol: 94 mg/dL (ref 0–99)
Total CHOL/HDL Ratio: 5.8 RATIO
Triglycerides: 129 mg/dL (ref <150)
VLDL: 26 mg/dL (ref 0–40)

## 2013-12-29 LAB — LACTATE DEHYDROGENASE: LDH: 807 U/L — ABNORMAL HIGH (ref 94–250)

## 2013-12-29 MED ORDER — LEVOFLOXACIN 500 MG PO TABS
500.0000 mg | ORAL_TABLET | Freq: Every day | ORAL | Status: DC
  Filled 2013-12-29: qty 1

## 2013-12-29 NOTE — Progress Notes (Signed)
      [address]       [place] [address]             [phone]      Subjective:  Patient complains of some tenderness with deep inspiration.   Objective: Vital signs in last 24 hours: Temp:  [97.9 F (36.6 C)-99.7 F (37.6 C)] 99.7 F (37.6 C) (09/23 0509) Pulse Rate:  [78-110] 95 (09/23 0946) Cardiac Rhythm:  [-]  Resp:  [20] 20 (09/23 0509) BP: (117-157)/(77-95) 126/82 mmHg (09/23 0946) SpO2:  [94 %-99 %] 99 % (09/23 0946)  Intake/Output from previous day: 09/22 0701 - 09/23 0700 In: 200 [P.O.:100; IV Piggyback:100] Out: -  Intake/Output this shift: Total I/O In: 222 [P.O.:222] Out: -   General appearance: alert, cooperative and no distress Heart: regular rate and rhythm Lungs: diminished breath sounds bibasilar Wound: clean and dry  Lab Results:  Recent Labs  12/27/13 0855  WBC 8.0  HGB 12.8*  HCT 38.1*  PLT 316   BMET:  Recent Labs  12/27/13 0855  NA 141  K 4.3  CL 103  CO2 27  GLUCOSE 113*  BUN 15  CREATININE 0.82  CALCIUM 9.3    PT/INR: No results found for this basename: LABPROT, INR,  in the last 72 hours ABG No results found for this basename: phart, pco2, po2, hco3, tco2, acidbasedef, o2sat   CBG (last 3)  No results found for this basename: GLUCAP,  in the last 72 hours  Assessment/Plan:  1. S/P Thoracentesis- 1.4 yellow fluid removed, cultures, cytology obtained.  Gram stains showed no bacteria will await final culture results 2. Pulm- follow up CXR free from pneumothorax, small residual pleural fluid on left, small pleural effusion on right 3. Dispo- no indication for VATS procedure at this time, will follow cultures and cytology, will make 1-2 week follow up appt at [facility] pending patients discharge from hospital   LOS: 2 days    Ahmed Prima, [facility] 12/29/2013

## 2013-12-29 NOTE — Progress Notes (Signed)
PROGRESS NOTE  Colin Lee JHE:[id number] DOB: 12-23-1960 DOA: 12/27/2013 PCP: Bronson Curb, PA-C  HPI/Subjective: Pt is 53 yo male with a history of recent pneumonia and parapneumonic effusion (in July) who was admitted for a recurrent bilateral pleural effusion.  His left sided effusion was drained by IR for the 3rd time in 3 months on 9/22.     Assessment/Plan: Recurrent Pleural Effusion - Uncertain etiology - Followed by Dr. Luan Pulling (pulmonologist) and now presents with recurrence. - Consulted CVTS.  We will follow their recommendations. - IR was consulted and performed thoracentesis, drawing off 1.4 liters.  Fluid appears exudative. - HIV non reactive, acute hep panel negative, TSH normal. - ANA positive.  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  Titer 1:640 (7/1)  Healthcare Associated Pneumonia - With pleural effusion recurrence, pt was initially treated with Zosyn and Vancomycin (9/21 - 9/23).   - Has remained afebrile and without leukocytosis.  Will D/c antibiotics. - History of pneumonia in July 2015.  Pleural effusion was presumed parapneumonic effusion.   Hypertension - Chronic.  BP has been slightly elevated during admission. - Continue to Cozaar.  Continue to monitor and consider increasing dose of Cozaar if persistently elevated.  Fever - Currently afebrile.  Treat with Motrin if recurs.  Restless Leg Syndrome - Continue Requip   DVT Prophylaxis:  heparin  Code Status: FULL  Family Communication: No family at bedside.  Pt is awake, alert, and agreeable to plan Disposition Plan: Home when medically stable.  Consultants:  Thoracic surgery  Procedures:  Thoracentesis performed on 9/22.  Antibiotics: Anti-infectives   Start     Dose/Rate Route Frequency Ordered Stop   12/29/13 1100  levofloxacin (LEVAQUIN) tablet 500 mg  Status:  Discontinued     500 mg Oral Daily 12/29/13 1047 12/29/13 1111   12/28/13 0600  vancomycin (VANCOCIN) 1,250 mg in  sodium chloride 0.9 % 250 mL IVPB  Status:  Discontinued     1,250 mg 166.7 mL/hr over 90 Minutes Intravenous Every 12 hours 12/27/13 1702 12/29/13 1044   12/27/13 2200  piperacillin-tazobactam (ZOSYN) IVPB 3.375 g  Status:  Discontinued     3.375 g 12.5 mL/hr over 240 Minutes Intravenous Every 8 hours 12/27/13 1702 12/29/13 1044   12/27/13 1900  vancomycin (VANCOCIN) IVPB 1000 mg/200 mL premix     1,000 mg 200 mL/hr over 60 Minutes Intravenous  Once 12/27/13 1702 12/27/13 2208   12/27/13 1430  piperacillin-tazobactam (ZOSYN) IVPB 3.375 g     3.375 g 100 mL/hr over 30 Minutes Intravenous  Once 12/27/13 1422 12/27/13 1501   12/27/13 1430  vancomycin (VANCOCIN) IVPB 1000 mg/200 mL premix     1,000 mg 200 mL/hr over 60 Minutes Intravenous  Once 12/27/13 1422 12/27/13 1601      Objective: Filed Vitals:   12/28/13 2136 12/28/13 2147 12/29/13 0509 12/29/13 0946  BP: 136/81  117/78 126/82  Pulse: 96  110 95  Temp: 98.7 F (37.1 C)  99.7 F (37.6 C)   TempSrc: Oral  Oral   Resp: 20  20   Height:      Weight:      SpO2: 95% 95% 94% 99%    Intake/Output Summary (Last 24 hours) at 12/29/13 1128 Last data filed at 12/29/13 0947  Gross per 24 hour  Intake    422 ml  Output      0 ml  Net    422 ml   Filed Weights   12/27/13 0843 12/27/13 2009  Weight: 128.822 kg (284 lb) 123.242 kg (271 lb 11.2 oz)    Exam: General: Well developed, well nourished, appears comfortable at rest seated in chair.   HEENT:  Anicteic Sclera, MMM.  Neck: Supple, no JVD, no masses  Cardiovascular: RRR, S1 S2 auscultated, no rubs, murmurs or gallops.   Respiratory: Good air mvmt, CTA,  decreased bS at bases.  Abdomen: Obese, Soft, nontender, nondistended, + bowel sounds  Extremities: Warm dry without cyanosis clubbing or edema. 2+ pulses in all 4 extremities. Neuro: AAOx3, cranial nerves grossly intact. Strength 5/5 in upper and lower extremities  Psych: Normal affect and demeanor with intact judgement  and insight   Data Reviewed: Basic Metabolic Panel:  Recent Labs Lab 12/27/13 0855  NA 141  K 4.3  CL 103  CO2 27  GLUCOSE 113*  BUN 15  CREATININE 0.82  CALCIUM 9.3   Liver Function Tests:  Recent Labs Lab 12/27/13 0855  AST 27  ALT 36  ALKPHOS 85  BILITOT 0.3  PROT 7.8  ALBUMIN 3.3*   CBC:  Recent Labs Lab 12/27/13 0855  WBC 8.0  NEUTROABS 5.1  HGB 12.8*  HCT 38.1*  MCV 82.1  PLT 316   Cardiac Enzymes:  Recent Labs Lab 12/27/13 0855  TROPONINI <0.30   BNP (last 3 results)  Recent Labs  10/06/13 1942  PROBNP 79.8   Studies: Dg Chest 1 View  12/28/2013   CLINICAL DATA:  Status post left thoracentesis  EXAM: CHEST - 1 VIEW  COMPARISON:  December 27, 2013  FINDINGS: The heart size and mediastinal contours are within normal limits. There are small bilateral pleural effusions. The left pleural effusion is smaller compared to prior exam. There is no pneumothorax. The visualized skeletal structures are stable.  IMPRESSION: Small bilateral pleural effusions. The left pleural effusion is smaller compared to prior exam. There is no pneumothorax.   Electronically Signed   By: Abelardo Diesel M.D.   On: 12/28/2013 12:13   Dg Chest 2 View  12/29/2013   CLINICAL DATA:  Status post thoracentesis.  Respiratory distress.  EXAM: CHEST  2 VIEW  COMPARISON:  12/28/2013.  FINDINGS: The patient is status post LEFT thoracentesis on 12/28/2013. The LEFT pleural effusion remains decreased. RIGHT pleural effusion is also noted. Unchanged cardiomediastinal silhouette and lung fields, with slight compressive atelectasis at the LEFT base.  IMPRESSION: Stable decreased LEFT pleural effusion.Decubitus radiographs reported separately.   Electronically Signed   By: Rolla Flatten M.D.   On: 12/29/2013 08:14   Ct Chest W Contrast  12/27/2013   CLINICAL DATA:  Shortness of breath for the past week.  EXAM: CT CHEST WITH CONTRAST  TECHNIQUE: Multidetector CT imaging of the chest was  performed during intravenous contrast administration.  CONTRAST:  20mL OMNIPAQUE IOHEXOL 300 MG/ML  SOLN  COMPARISON:  Chest radiographs obtained earlier today. Chest CT dated 10/06/2013.  FINDINGS: Moderate-sized left pleural effusion with a decrease in amount since 10/06/2013. Interval small right pleural effusion. No significant pericardial fluid. Bilateral upper lobe bullous changes. Mild bilateral lower lobe atelectasis, greater on the left. No lung nodules or enlarged lymph nodes.  Multiple gallstones in the gallbladder without gallbladder wall thickening or pericholecystic fluid. The largest stone measures 4 mm in maximum diameter. Rounded areas of low density in the liver have not changed significantly with appearances compatible with cysts. The largest is centrally located, measuring 2.7 cm in maximum diameter. Thoracic spine degenerative changes.  IMPRESSION: 1. Moderate-sized left pleural effusion with a mild  decreased in amount since 10/06/2013 an interval small right pleural effusion. 2. Mild bilateral lower lobe atelectasis, greater on the left. 3. COPD. 4. Cholelithiasis.   Electronically Signed   By: Enrique Sack M.D.   On: 12/27/2013 13:48   Dg Chest Bilateral Decubitus  12/29/2013   CLINICAL DATA:  Pleural effusions.  Prior thoracentesis.  EXAM: CHEST - BILATERAL DECUBITUS VIEW  COMPARISON:  12/29/2013.  12/28/2013.  FINDINGS: Decubitus views of the chest reveal small layering bilateral pleural effusions. No other focal abnormality identified.  IMPRESSION: Decubitus views of the chest reveal bilateral small layering pleural effusions.   Electronically Signed   By: Marcello Moores  Register   On: 12/29/2013 08:13   US Thoracentesis Asp Pleural Space W/img Guide  12/28/2013   CLINICAL DATA:  Recurrent left pleural effusion. Request diagnostic and therapeutic thoracentesis.  EXAM: ULTRASOUND GUIDED LEFT THORACENTESIS  COMPARISON:  Previous thoracentesis  PROCEDURE: An ultrasound guided thoracentesis was  thoroughly discussed with the patient and questions answered. The benefits, risks, alternatives and complications were also discussed. The patient understands and wishes to proceed with the procedure. Written consent was obtained.  Ultrasound was performed to localize and mark an adequate pocket of fluid in the left chest. The area was then prepped and draped in the normal sterile fashion. 1% Lidocaine was used for local anesthesia. Under ultrasound guidance a 19 gauge Yueh catheter was introduced. Thoracentesis was performed. The catheter was removed and a dressing applied.  Complications:  None immediate  FINDINGS: A total of approximately 1.4 L of clear yellow fluid was removed. A fluid sample well assent for laboratory analysis.  IMPRESSION: Successful ultrasound guided left thoracentesis yielding 1.4 L of pleural fluid.  Read by: Ascencion Dike PA-C   Electronically Signed   By: Jacqulynn Cadet M.D.   On: 12/28/2013 12:32    Scheduled Meds: . heparin  5,000 Units Subcutaneous 3 times per day  . Influenza vac split quadrivalent PF  0.5 mL Intramuscular Tomorrow-1000  . losartan  50 mg Oral Daily  . pantoprazole  40 mg Oral Daily  . polyethylene glycol  17 g Oral Daily  . rOPINIRole  0.25 mg Oral QHS   Continuous Infusions:   Principal Problem:   Pleural effusion Active Problems:   Hypertension   Fever   HCAP (healthcare-associated pneumonia)   Restless leg syndrome   Imogene Burn, PA-C  [facility] Pager [phone]. If 7PM-7AM, please contact night-coverage at [email or web address], password [facility] 12/29/2013, 11:28 AM  LOS: 2 days   Attending Patient was seen, examined,treatment plan was discussed with the Physician extender. I have directly reviewed the clinical findings, lab, imaging studies and management of this patient in detail. I have made the necessary changes to the above noted documentation, and agree with the documentation, as recorded by the Physician extender.  Nena Alexander  MD [facility].

## 2013-12-29 NOTE — Progress Notes (Signed)
  Echocardiogram 2D Echocardiogram has been performed.  Aviya Jarvie FRANCES 12/29/2013, 3:29 PM

## 2013-12-29 NOTE — Progress Notes (Signed)
Colin Lee to be D/C'd Home per MD order.  Discussed with the patient and all questions fully answered.    Medication List    STOP taking these medications       amoxicillin 500 MG capsule  Commonly known as:  AMOXIL      TAKE these medications       albuterol 108 (90 BASE) MCG/ACT inhaler  Commonly known as:  PROVENTIL HFA;VENTOLIN HFA  Inhale 2 puffs into the lungs every 6 (six) hours as needed for wheezing or shortness of breath.     HYDROcodone-acetaminophen 7.5-325 MG per tablet  Commonly known as:  NORCO  Take 1 tablet by mouth every 6 (six) hours as needed (dental pain).     ibuprofen 600 MG tablet  Commonly known as:  ADVIL,MOTRIN  Take 600 mg by mouth every 6 (six) hours as needed for moderate pain. pain     losartan 50 MG tablet  Commonly known as:  COZAAR  Take 50 mg by mouth daily.     omeprazole 20 MG capsule  Commonly known as:  PRILOSEC  Take 20 mg by mouth daily.     polyethylene glycol packet  Commonly known as:  MIRALAX / GLYCOLAX  Take 17 g by mouth daily.     rOPINIRole 0.25 MG tablet  Commonly known as:  REQUIP  Take 1 tablet (0.25 mg total) by mouth at bedtime.        VVS, Skin clean, dry and intact without evidence of skin break down, no evidence of skin tears noted. IV catheter discontinued intact. Site without signs and symptoms of complications. Dressing and pressure applied.  An After Visit Summary was printed and given to the patient.  D/c education completed with patient/family including follow up instructions, medication list, d/c activities limitations if indicated, with other d/c instructions as indicated by MD - patient able to verbalize understanding, all questions fully answered.   Patient instructed to return to ED, call 911, or call MD for any changes in condition.   Patient escorted via [place], and D/C home via private auto.  Delman Cheadle 12/29/2013 4:38 PM

## 2013-12-29 NOTE — Discharge Instructions (Signed)
Please request a Rheumatology referral thru Dr. Luan Pulling.  Follow up with Dr. [name] on 10/5 for xray and office appointment.

## 2013-12-29 NOTE — Discharge Summary (Signed)
Physician Discharge Summary  LENNIN OSMOND IDP:[id number] DOB: 21-Mar-1961 DOA: 12/27/2013  PCP: Bronson Curb, PA-C  Admit date: 12/27/2013 Discharge date: 12/29/2013  Time spent: 45 minutes  Recommendations for Outpatient Follow-up:  1. Please arrange for Rheumatology Eval.  Patient with positive ANA, c-ANCA, and elevated titers 2. Double stranded DNA and repeat ANCA screen with titers pending at the time of discharge.  3. Follow up with Dr. [name] for cytology results and next steps regarding pleural effusions. 10/5. 4. Review 2D echo results (still pending at the time of discharge).  Discharge Diagnoses:  Principal Problem:   Pleural effusion Active Problems:   Hypertension   Fever   HCAP (healthcare-associated pneumonia)   Restless leg syndrome   Discharge Condition: stable.  Diet recommendation: heart healthy  Filed Weights   12/27/13 0843 12/27/13 2009  Weight: 128.822 kg (284 lb) 123.242 kg (271 lb 11.2 oz)    History of present illness: Pt is 53 yo male with a history of recent pneumonia and parapneumonic effusion (in July) who was admitted for a recurrent bilateral pleural effusion. He has received thoracentesis 3 times since July.   Hospital Course:  Recurrent Pleural Effusion  - Uncertain etiology  - Followed by Dr. Luan Pulling (pulmonologist) and now presents with recurrence.  - Consulted CVTS. Patient did not need emergent surgery so they will follow cytology and see him outpatient to determine next steps.   - IR was consulted and performed thoracentesis, drawing off 1.4 liters. Fluid appears exudative.  - HIV non reactive, acute hep panel negative, TSH normal.  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 Titer 1:640 (7/1).   -Will order DS-DNA and repeat ANCA screen with titers.  Recommend Rheumatology evaluation-will defer to PCP.  Healthcare Associated Pneumonia  - With pleural effusion recurrence, pt was initially treated with Zosyn and  Vancomycin (9/21 - 9/23).  - Has remained afebrile and without leukocytosis. Will D/c antibiotics.  Do not believe he has pneumonia at this time. - History of pneumonia in July 2015. Pleural effusion was presumed parapneumonic effusion.   Hypertension  - Chronic. BP has been slightly elevated during admission.  - Continue to Cozaar. Continue to monitor and consider increasing dose of Cozaar if persistently elevated.   Restless Leg Syndrome  - Continue Requip   Procedures:  Thoracentesis  Consultations:  TCTS  IR  Discharge Exam: Filed Vitals:   12/29/13 0946  BP: 126/82  Pulse: 95  Temp:   Resp:    General: Well developed, well nourished, appears comfortable at rest seated in chair.  HEENT: Anicteic Sclera, MMM.  Neck: Supple, no JVD, no masses  Cardiovascular: RRR, S1 S2 auscultated, no rubs, murmurs or gallops.  Respiratory: Good air mvmt, CTA, decreased bS at bases.  Abdomen: Obese, Soft, nontender, nondistended, + bowel sounds  Extremities: Warm dry without cyanosis clubbing or edema. 2+ pulses in all 4 extremities.  Neuro: AAOx3, cranial nerves grossly intact. Strength 5/5 in upper and lower extremities  Psych: Normal affect and demeanor with intact judgement and insight  Discharge Instructions   Discharge Instructions   Diet - low sodium heart healthy    Complete by:  As directed      Increase activity slowly    Complete by:  As directed           Current Discharge Medication List    CONTINUE these medications which have NOT CHANGED   Details  albuterol (PROVENTIL HFA;VENTOLIN HFA) 108 (90 BASE) MCG/ACT inhaler Inhale 2  puffs into the lungs every 6 (six) hours as needed for wheezing or shortness of breath.    HYDROcodone-acetaminophen (NORCO) 7.5-325 MG per tablet Take 1 tablet by mouth every 6 (six) hours as needed (dental pain).    ibuprofen (ADVIL,MOTRIN) 600 MG tablet Take 600 mg by mouth every 6 (six) hours as needed for moderate pain. pain     losartan (COZAAR) 50 MG tablet Take 50 mg by mouth daily.    omeprazole (PRILOSEC) 20 MG capsule Take 20 mg by mouth daily.    polyethylene glycol (MIRALAX / GLYCOLAX) packet Take 17 g by mouth daily. Qty: 30 each, Refills: 0    rOPINIRole (REQUIP) 0.25 MG tablet Take 1 tablet (0.25 mg total) by mouth at bedtime. Qty: 30 tablet, Refills: 0      STOP taking these medications     amoxicillin (AMOXIL) 500 MG capsule        No Known Allergies Follow-up Information   Follow up with Grace Isaac, MD On 01/10/2014. (Appointment is at 1:00)    Specialty:  Cardiothoracic Surgery   Contact information:   [address] [place] [address] [phone]       Follow up with [facility] On 01/10/2014. (Please get CXR at 12:30, located on first floor of [facility])    Contact information:   [place]       Follow up with HAWKINS,EDWARD L, MD In 1 week.   Specialty:  Pulmonary Disease   Contact information:   [place] [address] [phone]        The results of significant diagnostics from this hospitalization (including imaging, microbiology, ancillary and laboratory) are listed below for reference.    Significant Diagnostic Studies: Dg Chest 1 View  12/28/2013   CLINICAL DATA:  Status post left thoracentesis  EXAM: CHEST - 1 VIEW  COMPARISON:  December 27, 2013  FINDINGS: The heart size and mediastinal contours are within normal limits. There are small bilateral pleural effusions. The left pleural effusion is smaller compared to prior exam. There is no pneumothorax. The visualized skeletal structures are stable.  IMPRESSION: Small bilateral pleural effusions. The left pleural effusion is smaller compared to prior exam. There is no pneumothorax.   Electronically Signed   By: Abelardo Diesel M.D.   On: 12/28/2013 12:13   Dg Chest 2 View  12/29/2013   CLINICAL DATA:  Status post thoracentesis.  Respiratory distress.   EXAM: CHEST  2 VIEW  COMPARISON:  12/28/2013.  FINDINGS: The patient is status post LEFT thoracentesis on 12/28/2013. The LEFT pleural effusion remains decreased. RIGHT pleural effusion is also noted. Unchanged cardiomediastinal silhouette and lung fields, with slight compressive atelectasis at the LEFT base.  IMPRESSION: Stable decreased LEFT pleural effusion.Decubitus radiographs reported separately.   Electronically Signed   By: Rolla Flatten M.D.   On: 12/29/2013 08:14   Dg Chest 2 View  12/27/2013   CLINICAL DATA:  Shortness of breath, former smoker, asthma.  EXAM: CHEST  2 VIEW  COMPARISON:  12/24/2013.  FINDINGS: Trachea is midline. Heart size stable. Lungs are hyperinflated with volume loss at the lung bases. Moderate left pleural effusion may have increased slightly in the interval. Small right pleural effusion, stable.  IMPRESSION: 1. Moderate left pleural effusion may have increased slightly in the interval. 2. Small right pleural effusion, stable. 3. Associated volume loss in the lung bases, stable.   Electronically Signed   By: Lorin Picket M.D.  On: 12/27/2013 09:17   Dg Chest 2 View  12/24/2013   CLINICAL DATA:  Increase shortness of breath today.  EXAM: CHEST  2 VIEW  COMPARISON:  12/06/2013.  FINDINGS: Small moderate left pleural effusion, similar to the prior study. Minimal right pleural effusion.  Mild interstitial thickening in the lower lungs with thickening of the fissures, similar to the prior study. No overt pulmonary edema. No lung consolidation to suggest pneumonia. No pneumothorax.  Cardiac silhouette is normal in size. No mediastinal or hilar masses.  Bony thorax is grossly intact.  IMPRESSION: 1. No acute findings.  No significant change from the previous exam. 2. Persistent small to moderate left and minimal right effusions.   Electronically Signed   By: Lajean Manes M.D.   On: 12/24/2013 17:42   Dg Chest 2 View  12/07/2013   CLINICAL DATA:  Left-sided chest pain  EXAM:  CHEST  2 VIEW  COMPARISON:  11/22/2013  FINDINGS: Again identified is a small left pleural effusion, stable when compared to the prior study. Interstitial opacities in the left lower lobe could represent atelectasis or pneumoniae/pneumonitis. Right lung remains clear. Heart size and vascular pattern are normal.  IMPRESSION: Persistent left pleural effusion   Electronically Signed   By: Skipper Cliche M.D.   On: 12/07/2013 08:16   Ct Chest W Contrast  12/27/2013   CLINICAL DATA:  Shortness of breath for the past week.  EXAM: CT CHEST WITH CONTRAST  TECHNIQUE: Multidetector CT imaging of the chest was performed during intravenous contrast administration.  CONTRAST:  18mL OMNIPAQUE IOHEXOL 300 MG/ML  SOLN  COMPARISON:  Chest radiographs obtained earlier today. Chest CT dated 10/06/2013.  FINDINGS: Moderate-sized left pleural effusion with a decrease in amount since 10/06/2013. Interval small right pleural effusion. No significant pericardial fluid. Bilateral upper lobe bullous changes. Mild bilateral lower lobe atelectasis, greater on the left. No lung nodules or enlarged lymph nodes.  Multiple gallstones in the gallbladder without gallbladder wall thickening or pericholecystic fluid. The largest stone measures 4 mm in maximum diameter. Rounded areas of low density in the liver have not changed significantly with appearances compatible with cysts. The largest is centrally located, measuring 2.7 cm in maximum diameter. Thoracic spine degenerative changes.  IMPRESSION: 1. Moderate-sized left pleural effusion with a mild decreased in amount since 10/06/2013 an interval small right pleural effusion. 2. Mild bilateral lower lobe atelectasis, greater on the left. 3. COPD. 4. Cholelithiasis.   Electronically Signed   By: Enrique Sack M.D.   On: 12/27/2013 13:48   Dg Chest Left Decubitus  12/07/2013   CLINICAL DATA:  Chest pain.  EXAM: CHEST - LEFT DECUBITUS  COMPARISON:  Same day.  FINDINGS: Left lateral decubitus view  of the chest demonstrates mild free-flowing left-sided pleural effusion. Right lung appears clear.  IMPRESSION: Mild free flowing left pleural effusion.   Electronically Signed   By: Sabino Dick M.D.   On: 12/07/2013 08:16   Dg Chest Bilateral Decubitus  12/29/2013   CLINICAL DATA:  Pleural effusions.  Prior thoracentesis.  EXAM: CHEST - BILATERAL DECUBITUS VIEW  COMPARISON:  12/29/2013.  12/28/2013.  FINDINGS: Decubitus views of the chest reveal small layering bilateral pleural effusions. No other focal abnormality identified.  IMPRESSION: Decubitus views of the chest reveal bilateral small layering pleural effusions.   Electronically Signed   By: Marcello Moores  Register   On: 12/29/2013 08:13   US Thoracentesis Asp Pleural Space W/img Guide  12/28/2013   CLINICAL DATA:  Recurrent left pleural  effusion. Request diagnostic and therapeutic thoracentesis.  EXAM: ULTRASOUND GUIDED LEFT THORACENTESIS  COMPARISON:  Previous thoracentesis  PROCEDURE: An ultrasound guided thoracentesis was thoroughly discussed with the patient and questions answered. The benefits, risks, alternatives and complications were also discussed. The patient understands and wishes to proceed with the procedure. Written consent was obtained.  Ultrasound was performed to localize and mark an adequate pocket of fluid in the left chest. The area was then prepped and draped in the normal sterile fashion. 1% Lidocaine was used for local anesthesia. Under ultrasound guidance a 19 gauge Yueh catheter was introduced. Thoracentesis was performed. The catheter was removed and a dressing applied.  Complications:  None immediate  FINDINGS: A total of approximately 1.4 L of clear yellow fluid was removed. A fluid sample well assent for laboratory analysis.  IMPRESSION: Successful ultrasound guided left thoracentesis yielding 1.4 L of pleural fluid.  Read by: Ascencion Dike PA-C   Electronically Signed   By: Jacqulynn Cadet M.D.   On: 12/28/2013 12:32     Microbiology: Recent Results (from the past 240 hour(s))  BODY FLUID CULTURE     Status: None   Collection Time    12/28/13 11:21 AM      Result Value Ref Range Status   Specimen Description FLUID LEFT PLEURAL   Final   Special Requests Normal   Final   Gram Stain     Final   Value: RARE WBC PRESENT,BOTH PMN AND MONONUCLEAR     NO ORGANISMS SEEN     Performed at [facility]   Culture     Final   Value: NO GROWTH 1 DAY     Performed at [facility]   Report Status PENDING   Incomplete  FUNGUS CULTURE W SMEAR     Status: None   Collection Time    12/28/13 11:21 AM      Result Value Ref Range Status   Specimen Description FLUID LEFT PLEURAL   Final   Special Requests Normal   Final   Fungal Smear     Final   Value: NO YEAST OR FUNGAL ELEMENTS SEEN     Performed at [facility]   Culture     Final   Value: CULTURE IN PROGRESS FOR FOUR WEEKS     Performed at [facility]   Report Status PENDING   Incomplete     Labs: Basic Metabolic Panel:  Recent Labs Lab 12/27/13 0855  NA 141  K 4.3  CL 103  CO2 27  GLUCOSE 113*  BUN 15  CREATININE 0.82  CALCIUM 9.3   Liver Function Tests:  Recent Labs Lab 12/27/13 0855  AST 27  ALT 36  ALKPHOS 85  BILITOT 0.3  PROT 7.8  ALBUMIN 3.3*   CBC:  Recent Labs Lab 12/27/13 0855  WBC 8.0  NEUTROABS 5.1  HGB 12.8*  HCT 38.1*  MCV 82.1  PLT 316   Cardiac Enzymes:  Recent Labs Lab 12/27/13 0855  TROPONINI <0.30   BNP: BNP (last 3 results)  Recent Labs  10/06/13 1942  PROBNP 79.8    Signed:  Karen Kitchens [phone]  [facility] 12/29/2013, 1:24 PM  Attending Patient seen and examined, third episode of recurrent pleural effusion. Exudative by physiology. 2-D echocardiogram negative. Cytology pending at the time of discharge, reviewed prior hospital records which indicated ulcerative ANA and c-ANCA, will repeat these studies, and send out a  double-stranded DNA. Family made aware that patient's  labs are pending at time of discharge and that patient will need rheumatology followup. Cardiothoracic surgery to followup patient as outpatient and determine further steps in management of pleural effusion. Is significantly improved post thoracocentesis, and stable for discharge for further workup stress management to be done in the outpatient setting. Family and patient agreed to the plan.  Nena Alexander MD

## 2013-12-30 LAB — ANCA SCREEN W REFLEX TITER
Atypical p-ANCA Screen: NEGATIVE
c-ANCA Screen: POSITIVE — AB
p-ANCA Screen: NEGATIVE

## 2013-12-30 LAB — ANCA TITERS: C-ANCA: 1:160 {titer} — ABNORMAL HIGH

## 2013-12-30 LAB — ANTI-DNA ANTIBODY, DOUBLE-STRANDED: ds DNA Ab: 1 IU/mL

## 2013-12-30 LAB — ANTI-NUCLEAR AB-TITER (ANA TITER): ANA Titer 1: 1:160 {titer} — ABNORMAL HIGH

## 2013-12-30 LAB — ANA: Anti Nuclear Antibody(ANA): POSITIVE — AB

## 2013-12-30 NOTE — Care Management Note (Signed)
    Page 1 of 1   12/30/2013     7:46:14 AM CARE MANAGEMENT NOTE 12/30/2013  Patient:  Colin Lee, Colin Lee   Account Number:  [id number]  Date Initiated:  12/28/2013  Documentation initiated by:  [facility]  Subjective/Objective Assessment:   Recurrent Pleural Effusion     Action/Plan:   Anticipated DC Date:  12/29/2013   Anticipated DC Plan:  [place]  CM consult      Choice offered to / List presented to:             Status of service:  Completed, signed off Medicare Important Message given?  NO (If response is "NO", the following Medicare IM given date fields will be blank) Date Medicare IM given:   Medicare IM given by:   Date Additional Medicare IM given:   Additional Medicare IM given by:    Discharge Disposition:  HOME/SELF CARE  Per UR Regulation:  Reviewed for med. necessity/level of care/duration of stay  If discussed at [place] of Stay Meetings, dates discussed:    Comments:

## 2013-12-31 LAB — BODY FLUID CULTURE
Culture: NO GROWTH
Special Requests: NORMAL

## 2014-01-06 ENCOUNTER — Other Ambulatory Visit: Payer: Self-pay | Admitting: Cardiothoracic Surgery

## 2014-01-06 DIAGNOSIS — J90 Pleural effusion, not elsewhere classified: Secondary | ICD-10-CM

## 2014-01-10 ENCOUNTER — Ambulatory Visit (INDEPENDENT_AMBULATORY_CARE_PROVIDER_SITE_OTHER): Payer: BC Managed Care – PPO | Admitting: Physician Assistant

## 2014-01-10 ENCOUNTER — Ambulatory Visit
Admission: RE | Admit: 2014-01-10 | Discharge: 2014-01-10 | Disposition: A | Discharge status: Home / Self Care | Payer: BC Managed Care – PPO | Source: Ambulatory Visit | Attending: Cardiothoracic Surgery | Admitting: Cardiothoracic Surgery

## 2014-01-10 VITALS — BP 163/96 | HR 79 | Resp 16

## 2014-01-10 DIAGNOSIS — J90 Pleural effusion, not elsewhere classified: Secondary | ICD-10-CM

## 2014-01-10 DIAGNOSIS — J948 Other specified pleural conditions: Secondary | ICD-10-CM

## 2014-01-10 NOTE — Progress Notes (Signed)
  HPI:  [name] presents today for follow up.  He is S/P Thoracentesis for recurrent pleural effusion.  This was done on 12/28/2013 at which time 1400 ml of fluid was removed from the left pleural space.  Cultures obtained during that procedure remain negative.  Fluid cytology shows inflammatory cells, no evidence of malignancy.  Currently the patient states he is doing okay.  He continues to feel fatigued and run down.  He denies dyspnea at rest, but states if he is up ambulating or doing light work he becomes short of breath.  Finally, he has elevated autoimmune titers, but states he has not yet set up an appointment with a Rheumatologist yet.   Current Outpatient Prescriptions  Medication Sig Dispense Refill  . albuterol (PROVENTIL HFA;VENTOLIN HFA) 108 (90 BASE) MCG/ACT inhaler Inhale 2 puffs into the lungs every 6 (six) hours as needed for wheezing or shortness of breath.      Marland Kitchen ibuprofen (ADVIL,MOTRIN) 600 MG tablet Take 600 mg by mouth every 6 (six) hours as needed for moderate pain. pain      . losartan (COZAAR) 50 MG tablet Take 50 mg by mouth daily.      Marland Kitchen omeprazole (PRILOSEC) 20 MG capsule Take 20 mg by mouth daily.      . polyethylene glycol (MIRALAX / GLYCOLAX) packet Take 17 g by mouth daily.  30 each  0  . rOPINIRole (REQUIP) 0.25 MG tablet Take 1 tablet (0.25 mg total) by mouth at bedtime.  30 tablet  0   No current facility-administered medications for this visit.    Physical Exam:  BP 163/96  Pulse 79  Resp 16  SpO2 98%  Gen: no apparent distress Heart: RRR Lungs: diminished breath sounds bilaterally  Diagnostic Tests:  CXR: stable appearance of bilateral pleural effusions, remains unchanged since 12/28/2013  A/P:  1. Recurrent left sided pleural effusions- remain unchanged since Thoracentesis performed on 12/28/2013, Cytology shows inflammatory cells, cultures remain negative 2. Elevated ANA, C ANCA titers- patient needs referred to Rheumatology for further  evaluation.  3. RTC in 4 weeks with CXR, however until patient evaluated by Rheumatology and underlying cause can possibly be identified, I explained he will likely continue to re- accumulate fluid.  He was instructed to contact our office should his dyspnea become worse and he is concerned his pleural fluid is increasing

## 2014-01-24 LAB — FUNGUS CULTURE W SMEAR
Fungal Smear: NONE SEEN
Special Requests: NORMAL

## 2014-01-31 ENCOUNTER — Other Ambulatory Visit: Payer: Self-pay | Admitting: *Deleted

## 2014-01-31 ENCOUNTER — Encounter (HOSPITAL_COMMUNITY): Payer: Self-pay

## 2014-01-31 ENCOUNTER — Encounter (HOSPITAL_COMMUNITY): Payer: Self-pay | Admitting: Pharmacy Technician

## 2014-01-31 ENCOUNTER — Ambulatory Visit
Admission: RE | Admit: 2014-01-31 | Discharge: 2014-01-31 | Disposition: A | Discharge status: Home / Self Care | Payer: BC Managed Care – PPO | Source: Ambulatory Visit | Attending: Thoracic Surgery (Cardiothoracic Vascular Surgery) | Admitting: Thoracic Surgery (Cardiothoracic Vascular Surgery)

## 2014-01-31 ENCOUNTER — Ambulatory Visit (INDEPENDENT_AMBULATORY_CARE_PROVIDER_SITE_OTHER): Payer: BC Managed Care – PPO | Admitting: Cardiothoracic Surgery

## 2014-01-31 ENCOUNTER — Encounter (HOSPITAL_COMMUNITY)
Admission: RE | Admit: 2014-01-31 | Discharge: 2014-01-31 | Disposition: A | Discharge status: Home / Self Care | Payer: BC Managed Care – PPO | Source: Ambulatory Visit | Attending: Cardiothoracic Surgery | Admitting: Cardiothoracic Surgery

## 2014-01-31 VITALS — BP 176/91 | HR 94 | Temp 98.6°F | Resp 18 | Ht 70.0 in | Wt 274.5 lb

## 2014-01-31 VITALS — BP 154/95 | HR 93 | Ht 70.0 in | Wt 271.0 lb

## 2014-01-31 DIAGNOSIS — J90 Pleural effusion, not elsewhere classified: Secondary | ICD-10-CM

## 2014-01-31 DIAGNOSIS — J948 Other specified pleural conditions: Secondary | ICD-10-CM

## 2014-01-31 DIAGNOSIS — R0602 Shortness of breath: Secondary | ICD-10-CM

## 2014-01-31 HISTORY — DX: Myoneural disorder, unspecified: G70.9

## 2014-01-31 HISTORY — DX: Headache: R51

## 2014-01-31 HISTORY — DX: Unspecified osteoarthritis, unspecified site: M19.90

## 2014-01-31 HISTORY — DX: Headache, unspecified: R51.9

## 2014-01-31 LAB — URINALYSIS, ROUTINE W REFLEX MICROSCOPIC
Bilirubin Urine: NEGATIVE
Glucose, UA: NEGATIVE mg/dL
Hgb urine dipstick: NEGATIVE
Ketones, ur: NEGATIVE mg/dL
Leukocytes, UA: NEGATIVE
Nitrite: NEGATIVE
Protein, ur: NEGATIVE mg/dL
Specific Gravity, Urine: 1.02 (ref 1.005–1.030)
Urobilinogen, UA: 1 mg/dL (ref 0.0–1.0)
pH: 6.5 (ref 5.0–8.0)

## 2014-01-31 LAB — TYPE AND SCREEN
ABO/RH(D): O POS
Antibody Screen: NEGATIVE

## 2014-01-31 LAB — COMPREHENSIVE METABOLIC PANEL
ALT: 28 U/L (ref 0–53)
AST: 22 U/L (ref 0–37)
Albumin: 3.2 g/dL — ABNORMAL LOW (ref 3.5–5.2)
Alkaline Phosphatase: 81 U/L (ref 39–117)
Anion gap: 14 (ref 5–15)
BUN: 15 mg/dL (ref 6–23)
CO2: 24 mEq/L (ref 19–32)
Calcium: 9.1 mg/dL (ref 8.4–10.5)
Chloride: 102 mEq/L (ref 96–112)
Creatinine, Ser: 0.89 mg/dL (ref 0.50–1.35)
GFR calc Af Amer: >90 mL/min (ref >90)
GFR calc non Af Amer: >90 mL/min (ref >90)
Glucose, Bld: 100 mg/dL — ABNORMAL HIGH (ref 70–99)
Potassium: 4 mEq/L (ref 3.7–5.3)
Sodium: 140 mEq/L (ref 137–147)
Total Bilirubin: 0.3 mg/dL (ref 0.3–1.2)
Total Protein: 7.9 g/dL (ref 6.0–8.3)

## 2014-01-31 LAB — CBC
HCT: 35.5 % — ABNORMAL LOW (ref 39.0–52.0)
Hemoglobin: 11.9 g/dL — ABNORMAL LOW (ref 13.0–17.0)
MCH: 26.8 pg (ref 26.0–34.0)
MCHC: 33.5 g/dL (ref 30.0–36.0)
MCV: 80 fL (ref 78.0–100.0)
Platelets: 349 10*3/uL (ref 150–400)
RBC: 4.44 MIL/uL (ref 4.22–5.81)
RDW: 13.1 % (ref 11.5–15.5)
WBC: 10.8 10*3/uL — ABNORMAL HIGH (ref 4.0–10.5)

## 2014-01-31 LAB — SURGICAL PCR SCREEN
MRSA, PCR: NEGATIVE
Staphylococcus aureus: NEGATIVE

## 2014-01-31 LAB — BLOOD GAS, ARTERIAL
Acid-Base Excess: 2.6 mmol/L — ABNORMAL HIGH (ref 0.0–2.0)
Bicarbonate: 26.5 mEq/L — ABNORMAL HIGH (ref 20.0–24.0)
Drawn by: 42180
FIO2: 0.21 %
O2 Saturation: 97.4 %
Patient temperature: 98.6
TCO2: 27.7 mmol/L (ref 0–100)
pCO2 arterial: 39.9 mmHg (ref 35.0–45.0)
pH, Arterial: 7.437 (ref 7.350–7.450)
pO2, Arterial: 94.7 mmHg (ref 80.0–100.0)

## 2014-01-31 LAB — APTT: aPTT: 29 seconds (ref 24–37)

## 2014-01-31 LAB — PROTIME-INR
INR: 1.03 (ref 0.00–1.49)
Prothrombin Time: 13.6 seconds (ref 11.6–15.2)

## 2014-01-31 LAB — ABO/RH: ABO/RH(D): O POS

## 2014-01-31 MED ORDER — DEXTROSE 5 % IV SOLN
1.5000 g | INTRAVENOUS | Status: AC
  Administered 2014-02-01: 1.5 g via INTRAVENOUS
  Filled 2014-01-31 (×2): qty 1.5

## 2014-01-31 NOTE — Progress Notes (Signed)
[address]       [place] [address]             [phone]          HPI: Mr. Baranek returns to the office today for follow up of a recurrent left pleural effusion.  He has been admitted to the hospital twice in the last 3 months with shortness of breath and fever.  He has been treated for community acquired pneumonia and has undergone 3 thoracenteses for a left parapneumonic effusion.  During his most recent hospital stay, TCTS was consulted for possible VATS/drainage of empyema.  At the time, the fluid had been drained via thoracentesis and did not appear to be infected.  Fluid cytology from the initial episode showed some reactive mesothelial cells, but more recent cytologies showed inflammation, and all cultures have been negative.  Echocardiogram showed normal EF with no other abnormalities.  ANA and c-ANA were positive and titers were elevated.  The patient is scheduled for rheumatology evaluation on 11/19.    He was seen in the office in follow up 2 weeks ago, and at that time was essentially asymptomatic.  However, over the past week, he has developed increasing dyspnea, orthopnea and dyspnea on exertion, with occasional chest discomfort.  He also reports occasional nonproductive cough, as well as subjective fevers, daily headaches, arthralgias and myalgias, and generalized malaise.  Additionally, the patient reports that he bruises easily. The patient returns to the office today for further evaluation.  Past Medical History: Past Medical History  Diagnosis Date  . GERD (gastroesophageal reflux disease)   . Hypertension   . Shortness of breath     Starting May 2015    Past Surgical History: Past Surgical History  Procedure Laterality Date  . No prior surgery    . Thoracentesis Left 2015   Social History: History   Social History  . Marital Status: Married    Spouse Name: N/A    Number of Children: N/A  . Years of Education: N/A    Occupational History  . Not on file.   Social History Main Topics  . Smoking status: Former Smoker -- 1.00 packs/day for 30 years    Types: Cigarettes    Quit date: 04/08/2000  . Smokeless tobacco: Never Used  . Alcohol Use: Yes     Comment: rare  . Drug Use: No  . Sexual Activity: Not on file     Family History: Family History  Problem Relation Age of Onset  . Adopted: Yes    Medications:  Current Outpatient Prescriptions  Medication Sig Dispense Refill  . losartan (COZAAR) 50 MG tablet Take 50 mg by mouth daily.      Marland Kitchen omeprazole (PRILOSEC) 20 MG capsule Take 20 mg by mouth daily.      . polyethylene glycol (MIRALAX / GLYCOLAX) packet Take 17 g by mouth daily.  30 each  0  . rOPINIRole (REQUIP) 0.25 MG tablet Take 1 tablet (0.25 mg total) by mouth at bedtime.  30 tablet  0  . albuterol (PROVENTIL HFA;VENTOLIN HFA) 108 (90 BASE) MCG/ACT inhaler Inhale 2 puffs into the lungs every 6 (six) hours as needed for wheezing or shortness of breath.      Marland Kitchen ibuprofen (ADVIL,MOTRIN) 600 MG tablet Take 600 mg by mouth every 6 (six) hours as needed for moderate pain. pain       No current facility-administered medications for this visit.    Review  of Systems: Review of Systems  Constitutional: Positive for fever, chills, weight loss and malaise/fatigue. Negative for diaphoresis.  HENT: Negative for congestion, ear discharge, ear pain, hearing loss, nosebleeds, sore throat and tinnitus.   Eyes: Negative for blurred vision, double vision and pain.  Respiratory: Positive for cough and shortness of breath. Negative for hemoptysis, sputum production, wheezing and stridor.   Cardiovascular: Positive for chest pain and orthopnea. Negative for palpitations, claudication, leg swelling and PND.  Gastrointestinal: Negative for heartburn, nausea, vomiting, abdominal pain, diarrhea, constipation and blood in stool.  Genitourinary: Negative for dysuria, urgency, frequency and hematuria.   Musculoskeletal: Positive for joint pain and myalgias. Negative for back pain, falls and neck pain.  Skin: Negative for itching and rash.  Neurological: Positive for weakness and headaches. Negative for dizziness, tremors, focal weakness and seizures.  Endo/Heme/Allergies: Negative for environmental allergies and polydipsia. Bruises/bleeds easily.  Psychiatric/Behavioral: Negative for depression. The patient is not nervous/anxious.      Physical Exam: BP 154/95 HR 93 Sats 96% on room air Heart: regular rate and rhythm Lungs: Diminished breath sounds in left base Extremities: No lower extremity edema   Diagnostic Tests: Chest xray: Dg Chest 2 View  01/31/2014   CLINICAL DATA:  Recurrent pleural effusions. Persistent cough and shortness of breath  EXAM: CHEST  2 VIEW  COMPARISON:  January 10, 2014  FINDINGS: There is a left pleural effusion which appears at least partially loculated. This effusion is larger compared to recent prior study. There is patchy consolidation in the left base as well. On the right, there is minimal right base atelectasis. There is evidence of underlying emphysema. Elsewhere lungs are clear. Heart size and pulmonary vascularity are normal. No adenopathy. There is degenerative change in the thoracic spine.  IMPRESSION: Effusion on the left is at least partially loculated in larger compared to recent prior study. There is patchy consolidation in the left base. There is underlying emphysema. There is minimal atelectasis in the right base. Right lung otherwise appears clear. No change in cardiac silhouette.   Electronically Signed   By: Lowella Grip M.D.   On: 01/31/2014 13:14       Assessment/Plan: I have seen the patient   the patient today and reviewed his chest  X-ray.  He has a recurrent left pleural effusion, the etiology of which is unclear.  Because of the rapid reaccumulation of fluid after multiple thoracenteses, it is felt that he would benefit from  surgical intervention at this time.  We will schedule him for a bronchoscopy , left VATS, drainage of left pleural effusion, pleural biopsies, possible talc pleurodesis  And placement of pleurix catheter on Tuesday, 02/01/2014.  All risks, benefits and alternatives of surgery have been explained to the patient in detail and he agrees to proceed.  Grace Isaac MD      [address] [place] [address] Office [phone]   Beeper [phone]

## 2014-01-31 NOTE — Progress Notes (Signed)
01/31/14 1527  OBSTRUCTIVE SLEEP APNEA  Have you ever been diagnosed with sleep apnea through a sleep study? No  Do you snore loudly (loud enough to be heard through closed doors)?  0  Do you often feel tired, fatigued, or sleepy during the daytime? 0  Has anyone observed you stop breathing during your sleep? 0  Do you have, or are you being treated for high blood pressure? 1  BMI more than 35 kg/m2? 1  Age over 51 years old? 1  Neck circumference greater than 40 cm/16 inches? 1  Gender: 1  Obstructive Sleep Apnea Score 5  Score 4 or greater  Results sent to PCP

## 2014-01-31 NOTE — Pre-Procedure Instructions (Signed)
JOHNATON SONNEBORN  01/31/2014   Your procedure is scheduled on:  Tuesday October 27 th at 0900 AM  Report to [facility] Admitting at 0700 AM.  Call this number if you have problems the morning of surgery: [phone]   Remember:   Do not eat food or drink liquids after midnight.   Take these medicines the morning of surgery with A SIP OF WATER: Albuterol inhaler use and bring with you, and  Prilosec. Stop Ibuprofen today.   Do not wear jewelry.  Do not wear lotions, powders, or perfumes. You may wear deodorant.             Men may shave face and neck.  Do not bring valuables to the hospital.  [facility] is not responsible for any belongings or valuables.               Contacts, dentures or bridgework may not be worn into surgery.  Leave suitcase in the car. After surgery it may be brought to your room.  For patients admitted to the hospital, discharge time is determined by your  treatment team.               Patients discharged the day of surgery will not be allowed to drive home.    Special Instructions: [facility] - Preparing for Surgery  Before surgery, you can play an important role.  Because skin is not sterile, your skin needs to be as free of germs as possible.  You can reduce the number of germs on you skin by washing with CHG (chlorahexidine gluconate) soap before surgery.  CHG is an antiseptic cleaner which kills germs and bonds with the skin to continue killing germs even after washing.  Please DO NOT use if you have an allergy to CHG or antibacterial soaps.  If your skin becomes reddened/irritated stop using the CHG and inform your nurse when you arrive at Short Stay.  Do not shave (including legs and underarms) for at least 48 hours prior to the first CHG shower.  You may shave your face.  Please follow these instructions carefully:   1.  Shower with CHG Soap the night before surgery and the                                morning of Surgery.  2.  If  you choose to wash your hair, wash your hair first as usual with your       normal shampoo.  3.  After you shampoo, rinse your hair and body thoroughly to remove the                      Shampoo.  4.  Use CHG as you would any other liquid soap.  You can apply chg directly       to the skin and wash gently with scrungie or a clean washcloth.  5.  Apply the CHG Soap to your body ONLY FROM THE NECK DOWN.        Do not use on open wounds or open sores.  Avoid contact with your eyes,       ears, mouth and genitals (private parts).  Wash genitals (private parts)       with your normal soap.  6.  Wash thoroughly, paying special attention to the area where your surgery        will  be performed.  7.  Thoroughly rinse your body with warm water from the neck down.  8.  DO NOT shower/wash with your normal soap after using and rinsing off       the CHG Soap.  9.  Pat yourself dry with a clean towel.            10.  Wear clean pajamas.            11.  Place clean sheets on your bed the night of your first shower and do not        sleep with pets.  Day of Surgery  Do not apply any lotions/deoderants the morning of surgery.  Please wear clean clothes to the hospital/surgery center.      Please read over the following fact sheets that you were given: Pain Booklet, Coughing and Deep Breathing, Blood Transfusion Information, MRSA Information and Surgical Site Infection Prevention

## 2014-02-01 ENCOUNTER — Encounter (HOSPITAL_COMMUNITY): Payer: Self-pay | Admitting: Certified Registered"

## 2014-02-01 ENCOUNTER — Encounter (HOSPITAL_COMMUNITY)
Admission: RE | Disposition: A | Discharge status: Home / Self Care | Payer: Self-pay | Source: Ambulatory Visit | Attending: Cardiothoracic Surgery

## 2014-02-01 ENCOUNTER — Inpatient Hospital Stay (HOSPITAL_COMMUNITY)
Admission: RE | Admit: 2014-02-01 | Discharge: 2014-02-04 | DRG: 168 | Disposition: A | Discharge status: Home / Self Care | Payer: BC Managed Care – PPO | Source: Ambulatory Visit | Attending: Cardiothoracic Surgery | Admitting: Cardiothoracic Surgery

## 2014-02-01 ENCOUNTER — Encounter (HOSPITAL_COMMUNITY): Payer: BC Managed Care – PPO | Admitting: Certified Registered"

## 2014-02-01 ENCOUNTER — Inpatient Hospital Stay (HOSPITAL_COMMUNITY): Payer: BC Managed Care – PPO | Admitting: Certified Registered"

## 2014-02-01 ENCOUNTER — Inpatient Hospital Stay (HOSPITAL_COMMUNITY): Payer: BC Managed Care – PPO

## 2014-02-01 DIAGNOSIS — G2581 Restless legs syndrome: Secondary | ICD-10-CM | POA: Diagnosis present

## 2014-02-01 DIAGNOSIS — K219 Gastro-esophageal reflux disease without esophagitis: Secondary | ICD-10-CM | POA: Diagnosis present

## 2014-02-01 DIAGNOSIS — J90 Pleural effusion, not elsewhere classified: Principal | ICD-10-CM | POA: Diagnosis present

## 2014-02-01 DIAGNOSIS — R509 Fever, unspecified: Secondary | ICD-10-CM | POA: Diagnosis present

## 2014-02-01 DIAGNOSIS — R0602 Shortness of breath: Secondary | ICD-10-CM | POA: Diagnosis present

## 2014-02-01 DIAGNOSIS — I10 Essential (primary) hypertension: Secondary | ICD-10-CM | POA: Diagnosis present

## 2014-02-01 DIAGNOSIS — Z9689 Presence of other specified functional implants: Secondary | ICD-10-CM

## 2014-02-01 DIAGNOSIS — E669 Obesity, unspecified: Secondary | ICD-10-CM | POA: Diagnosis present

## 2014-02-01 DIAGNOSIS — Z87891 Personal history of nicotine dependence: Secondary | ICD-10-CM

## 2014-02-01 DIAGNOSIS — Z6838 Body mass index (BMI) 38.0-38.9, adult: Secondary | ICD-10-CM

## 2014-02-01 DIAGNOSIS — J948 Other specified pleural conditions: Secondary | ICD-10-CM

## 2014-02-01 HISTORY — PX: PLEURAL EFFUSION DRAINAGE: SHX5099

## 2014-02-01 HISTORY — PX: VIDEO ASSISTED THORACOSCOPY: SHX5073

## 2014-02-01 HISTORY — PX: CHEST TUBE INSERTION: SHX231

## 2014-02-01 HISTORY — PX: PLEURAL BIOPSY: SHX5082

## 2014-02-01 HISTORY — PX: TALC PLEURODESIS: SHX2506

## 2014-02-01 HISTORY — PX: VIDEO BRONCHOSCOPY: SHX5072

## 2014-02-01 LAB — BODY FLUID CELL COUNT WITH DIFFERENTIAL
Eos, Fluid: 9 %
Lymphs, Fluid: 59 %
Monocyte-Macrophage-Serous Fluid: 32 % — ABNORMAL LOW (ref 50–90)
Neutrophil Count, Fluid: 0 % (ref 0–25)
Other Cells, Fluid: 0 %
Total Nucleated Cell Count, Fluid: 2204 cu mm — ABNORMAL HIGH (ref 0–1000)

## 2014-02-01 LAB — GLUCOSE, SEROUS FLUID: Glucose, Fluid: 90 mg/dL

## 2014-02-01 LAB — LACTATE DEHYDROGENASE, PLEURAL OR PERITONEAL FLUID: LD, Fluid: 682 U/L — ABNORMAL HIGH (ref 3–23)

## 2014-02-01 LAB — PROTEIN, BODY FLUID: Total protein, fluid: 4.7 g/dL

## 2014-02-01 SURGERY — VIDEO ASSISTED THORACOSCOPY
Anesthesia: General | Site: Chest

## 2014-02-01 MED ORDER — SUCCINYLCHOLINE CHLORIDE 20 MG/ML IJ SOLN
INTRAMUSCULAR | Status: AC
  Filled 2014-02-01: qty 1

## 2014-02-01 MED ORDER — EPHEDRINE SULFATE 50 MG/ML IJ SOLN
INTRAMUSCULAR | Status: AC
  Filled 2014-02-01: qty 1

## 2014-02-01 MED ORDER — NALOXONE HCL 0.4 MG/ML IJ SOLN: 0.4000 mg | INTRAMUSCULAR | Status: DC | PRN

## 2014-02-01 MED ORDER — LOSARTAN POTASSIUM 50 MG PO TABS
50.0000 mg | ORAL_TABLET | Freq: Every day | ORAL | Status: DC
  Administered 2014-02-01 – 2014-02-04 (×4): 50 mg via ORAL
  Filled 2014-02-01 (×4): qty 1

## 2014-02-01 MED ORDER — DIPHENHYDRAMINE HCL 12.5 MG/5ML PO ELIX
12.5000 mg | ORAL_SOLUTION | Freq: Four times a day (QID) | ORAL | Status: DC | PRN
  Filled 2014-02-01: qty 5

## 2014-02-01 MED ORDER — MIDAZOLAM HCL 5 MG/5ML IJ SOLN
INTRAMUSCULAR | Status: DC | PRN
  Administered 2014-02-01: 2 mg via INTRAVENOUS

## 2014-02-01 MED ORDER — CETYLPYRIDINIUM CHLORIDE 0.05 % MT LIQD
7.0000 mL | Freq: Two times a day (BID) | OROMUCOSAL | Status: DC
  Administered 2014-02-01 – 2014-02-03 (×4): 7 mL via OROMUCOSAL

## 2014-02-01 MED ORDER — DEXAMETHASONE SODIUM PHOSPHATE 4 MG/ML IJ SOLN
INTRAMUSCULAR | Status: AC
  Filled 2014-02-01: qty 2

## 2014-02-01 MED ORDER — TALC 5 G PL SUSR
3.0000 g | Freq: Once | INTRAVENOUS | Status: DC
  Filled 2014-02-01: qty 3

## 2014-02-01 MED ORDER — HYDROMORPHONE HCL 1 MG/ML IJ SOLN
INTRAMUSCULAR | Status: AC
  Filled 2014-02-01: qty 1

## 2014-02-01 MED ORDER — PHENYLEPHRINE HCL 10 MG/ML IJ SOLN
10.0000 mg | INTRAVENOUS | Status: DC | PRN
  Administered 2014-02-01: 15 ug/min via INTRAVENOUS

## 2014-02-01 MED ORDER — DEXTROSE 5 % IV SOLN
1.5000 g | Freq: Two times a day (BID) | INTRAVENOUS | Status: AC
  Administered 2014-02-01 – 2014-02-02 (×2): 1.5 g via INTRAVENOUS
  Filled 2014-02-01 (×2): qty 1.5

## 2014-02-01 MED ORDER — LACTATED RINGERS IV SOLN
INTRAVENOUS | Status: DC
  Administered 2014-02-01 (×2): via INTRAVENOUS

## 2014-02-01 MED ORDER — OXYCODONE HCL 5 MG/5ML PO SOLN
5.0000 mg | Freq: Once | ORAL | Status: AC | PRN
Start: 2014-02-01 — End: 2014-02-01

## 2014-02-01 MED ORDER — ROPINIROLE HCL 0.25 MG PO TABS
0.2500 mg | ORAL_TABLET | Freq: Every day | ORAL | Status: DC
  Administered 2014-02-01 – 2014-02-03 (×3): 0.25 mg via ORAL
  Filled 2014-02-01 (×4): qty 1

## 2014-02-01 MED ORDER — PHENYLEPHRINE 40 MCG/ML (10ML) SYRINGE FOR IV PUSH (FOR BLOOD PRESSURE SUPPORT)
PREFILLED_SYRINGE | INTRAVENOUS | Status: AC
  Filled 2014-02-01: qty 10

## 2014-02-01 MED ORDER — KCL IN DEXTROSE-NACL 20-5-0.45 MEQ/L-%-% IV SOLN
INTRAVENOUS | Status: AC
  Filled 2014-02-01: qty 1000

## 2014-02-01 MED ORDER — KCL IN DEXTROSE-NACL 20-5-0.45 MEQ/L-%-% IV SOLN
INTRAVENOUS | Status: DC
  Administered 2014-02-01 (×2): via INTRAVENOUS
  Filled 2014-02-01 (×5): qty 1000

## 2014-02-01 MED ORDER — POTASSIUM CHLORIDE 10 MEQ/50ML IV SOLN
10.0000 meq | Freq: Every day | INTRAVENOUS | Status: DC | PRN
  Filled 2014-02-01: qty 50

## 2014-02-01 MED ORDER — ONDANSETRON HCL 4 MG/2ML IJ SOLN
INTRAMUSCULAR | Status: DC | PRN
  Administered 2014-02-01: 4 mg via INTRAVENOUS

## 2014-02-01 MED ORDER — TRAMADOL HCL 50 MG PO TABS
50.0000 mg | ORAL_TABLET | Freq: Four times a day (QID) | ORAL | Status: DC | PRN
Start: 2014-02-01 — End: 2014-02-04
  Administered 2014-02-03: 100 mg via ORAL
  Filled 2014-02-01: qty 2

## 2014-02-01 MED ORDER — VECURONIUM BROMIDE 10 MG IV SOLR
INTRAVENOUS | Status: DC | PRN
  Administered 2014-02-01 (×3): 1 mg via INTRAVENOUS

## 2014-02-01 MED ORDER — ONDANSETRON HCL 4 MG/2ML IJ SOLN
INTRAMUSCULAR | Status: AC
  Filled 2014-02-01: qty 2

## 2014-02-01 MED ORDER — STERILE WATER FOR INJECTION IJ SOLN
INTRAMUSCULAR | Status: AC
  Filled 2014-02-01: qty 10

## 2014-02-01 MED ORDER — ACETAMINOPHEN 500 MG PO TABS
1000.0000 mg | ORAL_TABLET | Freq: Four times a day (QID) | ORAL | Status: DC
  Administered 2014-02-01 – 2014-02-02 (×3): 1000 mg via ORAL
  Filled 2014-02-01 (×7): qty 2

## 2014-02-01 MED ORDER — 0.9 % SODIUM CHLORIDE (POUR BTL) OPTIME
TOPICAL | Status: DC | PRN
  Administered 2014-02-01: 1000 mL

## 2014-02-01 MED ORDER — PHENYLEPHRINE HCL 10 MG/ML IJ SOLN
INTRAMUSCULAR | Status: DC | PRN
Start: 2014-02-01 — End: 2014-02-01
  Administered 2014-02-01: 80 ug via INTRAVENOUS

## 2014-02-01 MED ORDER — ROCURONIUM BROMIDE 100 MG/10ML IV SOLN
INTRAVENOUS | Status: DC | PRN
  Administered 2014-02-01: 50 mg via INTRAVENOUS

## 2014-02-01 MED ORDER — NEOSTIGMINE METHYLSULFATE 10 MG/10ML IV SOLN
INTRAVENOUS | Status: DC | PRN
  Administered 2014-02-01: 3 mg via INTRAVENOUS

## 2014-02-01 MED ORDER — OXYCODONE HCL 5 MG PO TABS
ORAL_TABLET | ORAL | Status: AC
  Filled 2014-02-01: qty 1

## 2014-02-01 MED ORDER — VECURONIUM BROMIDE 10 MG IV SOLR
INTRAVENOUS | Status: AC
  Filled 2014-02-01: qty 10

## 2014-02-01 MED ORDER — PROMETHAZINE HCL 25 MG/ML IJ SOLN: 6.2500 mg | INTRAMUSCULAR | Status: DC | PRN

## 2014-02-01 MED ORDER — OXYCODONE HCL 5 MG PO TABS
5.0000 mg | ORAL_TABLET | Freq: Once | ORAL | Status: AC | PRN
  Administered 2014-02-01: 5 mg via ORAL

## 2014-02-01 MED ORDER — DEXAMETHASONE SODIUM PHOSPHATE 4 MG/ML IJ SOLN
INTRAMUSCULAR | Status: DC | PRN
  Administered 2014-02-01: 8 mg via INTRAVENOUS

## 2014-02-01 MED ORDER — MIDAZOLAM HCL 2 MG/2ML IJ SOLN
INTRAMUSCULAR | Status: AC
  Filled 2014-02-01: qty 2

## 2014-02-01 MED ORDER — OXYCODONE HCL 5 MG PO TABS
5.0000 mg | ORAL_TABLET | ORAL | Status: DC | PRN
  Administered 2014-02-01 – 2014-02-03 (×9): 10 mg via ORAL
  Administered 2014-02-04: 5 mg via ORAL
  Administered 2014-02-04: 10 mg via ORAL
  Filled 2014-02-01 (×8): qty 2
  Filled 2014-02-01: qty 1
  Filled 2014-02-01 (×2): qty 2

## 2014-02-01 MED ORDER — ALBUTEROL SULFATE (2.5 MG/3ML) 0.083% IN NEBU: 2.5000 mg | INHALATION_SOLUTION | Freq: Four times a day (QID) | RESPIRATORY_TRACT | Status: DC | PRN

## 2014-02-01 MED ORDER — GLYCOPYRROLATE 0.2 MG/ML IJ SOLN
INTRAMUSCULAR | Status: AC
  Filled 2014-02-01: qty 2

## 2014-02-01 MED ORDER — DIPHENHYDRAMINE HCL 50 MG/ML IJ SOLN: 12.5000 mg | Freq: Four times a day (QID) | INTRAMUSCULAR | Status: DC | PRN

## 2014-02-01 MED ORDER — PROPOFOL 10 MG/ML IV BOLUS
INTRAVENOUS | Status: AC
  Filled 2014-02-01: qty 20

## 2014-02-01 MED ORDER — SODIUM CHLORIDE 0.9 % IJ SOLN: 9.0000 mL | INTRAMUSCULAR | Status: DC | PRN

## 2014-02-01 MED ORDER — ROCURONIUM BROMIDE 50 MG/5ML IV SOLN
INTRAVENOUS | Status: AC
  Filled 2014-02-01: qty 1

## 2014-02-01 MED ORDER — GLYCOPYRROLATE 0.2 MG/ML IJ SOLN
INTRAMUSCULAR | Status: DC | PRN
  Administered 2014-02-01: 0.4 mg via INTRAVENOUS

## 2014-02-01 MED ORDER — SENNOSIDES-DOCUSATE SODIUM 8.6-50 MG PO TABS
1.0000 | ORAL_TABLET | Freq: Every day | ORAL | Status: DC
  Administered 2014-02-01 – 2014-02-03 (×3): 1 via ORAL
  Filled 2014-02-01 (×4): qty 1

## 2014-02-01 MED ORDER — POLYETHYLENE GLYCOL 3350 17 G PO PACK
17.0000 g | PACK | Freq: Every day | ORAL | Status: DC
  Administered 2014-02-02: 17 g via ORAL
  Filled 2014-02-01 (×2): qty 1

## 2014-02-01 MED ORDER — FENTANYL CITRATE 0.05 MG/ML IJ SOLN
INTRAMUSCULAR | Status: AC
  Filled 2014-02-01: qty 5

## 2014-02-01 MED ORDER — PANTOPRAZOLE SODIUM 40 MG PO TBEC: 40.0000 mg | DELAYED_RELEASE_TABLET | Freq: Every day | ORAL | Status: DC

## 2014-02-01 MED ORDER — SODIUM CHLORIDE 0.9 % IJ SOLN
INTRAMUSCULAR | Status: AC
  Filled 2014-02-01: qty 10

## 2014-02-01 MED ORDER — ONDANSETRON HCL 4 MG/2ML IJ SOLN
4.0000 mg | Freq: Four times a day (QID) | INTRAMUSCULAR | Status: DC | PRN
  Administered 2014-02-01: 4 mg via INTRAVENOUS
  Filled 2014-02-01: qty 2

## 2014-02-01 MED ORDER — PROPOFOL 10 MG/ML IV BOLUS
INTRAVENOUS | Status: DC | PRN
  Administered 2014-02-01: 100 mg via INTRAVENOUS

## 2014-02-01 MED ORDER — LIDOCAINE HCL (CARDIAC) 20 MG/ML IV SOLN
INTRAVENOUS | Status: AC
  Filled 2014-02-01: qty 5

## 2014-02-01 MED ORDER — ROPINIROLE HCL 0.25 MG PO TABS
0.2500 mg | ORAL_TABLET | Freq: Every day | ORAL | Status: DC
  Filled 2014-02-01: qty 1

## 2014-02-01 MED ORDER — ONDANSETRON HCL 4 MG/2ML IJ SOLN: 4.0000 mg | Freq: Four times a day (QID) | INTRAMUSCULAR | Status: DC | PRN

## 2014-02-01 MED ORDER — HYDROMORPHONE HCL 1 MG/ML IJ SOLN
0.2500 mg | INTRAMUSCULAR | Status: DC | PRN
  Administered 2014-02-01 (×4): 0.5 mg via INTRAVENOUS

## 2014-02-01 MED ORDER — BISACODYL 5 MG PO TBEC
10.0000 mg | DELAYED_RELEASE_TABLET | Freq: Every day | ORAL | Status: DC
  Administered 2014-02-03: 10 mg via ORAL
  Filled 2014-02-01: qty 2

## 2014-02-01 MED ORDER — TALC 5 G PL SUSR
INTRAPLEURAL | Status: AC
  Filled 2014-02-01: qty 10

## 2014-02-01 MED ORDER — LIDOCAINE HCL (CARDIAC) 20 MG/ML IV SOLN
INTRAVENOUS | Status: DC | PRN
  Administered 2014-02-01: 60 mg via INTRAVENOUS

## 2014-02-01 MED ORDER — NEOSTIGMINE METHYLSULFATE 10 MG/10ML IV SOLN
INTRAVENOUS | Status: AC
  Filled 2014-02-01: qty 1

## 2014-02-01 MED ORDER — ACETAMINOPHEN 160 MG/5ML PO SOLN: 1000.0000 mg | Freq: Four times a day (QID) | ORAL | Status: DC

## 2014-02-01 MED ORDER — FENTANYL CITRATE 0.05 MG/ML IJ SOLN
INTRAMUSCULAR | Status: DC | PRN
  Administered 2014-02-01 (×2): 50 ug via INTRAVENOUS
  Administered 2014-02-01 (×2): 100 ug via INTRAVENOUS

## 2014-02-01 MED ORDER — TALC 5 G PL SUSR
INTRAPLEURAL | Status: DC | PRN
  Administered 2014-02-01: 5 g via INTRAPLEURAL

## 2014-02-01 MED ORDER — FENTANYL 10 MCG/ML IV SOLN
INTRAVENOUS | Status: DC
  Administered 2014-02-01: 150 ug via INTRAVENOUS
  Administered 2014-02-01: 135 ug via INTRAVENOUS
  Administered 2014-02-01: 12:00:00 via INTRAVENOUS
  Administered 2014-02-02: 60 ug via INTRAVENOUS
  Administered 2014-02-02: 105 ug via INTRAVENOUS
  Administered 2014-02-02: 150 ug via INTRAVENOUS
  Administered 2014-02-02: 163 ug via INTRAVENOUS
  Administered 2014-02-02: 22:00:00 via INTRAVENOUS
  Administered 2014-02-02 (×2): 90 ug via INTRAVENOUS
  Administered 2014-02-03: 15 ug via INTRAVENOUS
  Administered 2014-02-03: 130 ug via INTRAVENOUS
  Administered 2014-02-03: 110 ug via INTRAVENOUS
  Filled 2014-02-01 (×3): qty 50

## 2014-02-01 SURGICAL SUPPLY — 86 items
APPLICATOR TIP COSEAL (VASCULAR PRODUCTS) IMPLANT
APPLICATOR TIP EXT COSEAL (VASCULAR PRODUCTS) IMPLANT
BLADE SURG 11 STRL SS (BLADE) IMPLANT
BRUSH CYTOL CELLEBRITY 1.5X140 (MISCELLANEOUS) IMPLANT
BRUSH SCRUB EZ PLAIN DRY (MISCELLANEOUS) ×6 IMPLANT
CANISTER SUCTION 2500CC (MISCELLANEOUS) ×3 IMPLANT
CATH KIT ON Q 5IN SLV (PAIN MANAGEMENT) IMPLANT
CATH ROBINSON RED A/P 18FR (CATHETERS) ×3 IMPLANT
CATH THORACIC 28FR (CATHETERS) IMPLANT
CATH THORACIC 36FR (CATHETERS) IMPLANT
CATH THORACIC 36FR RT ANG (CATHETERS) IMPLANT
CLEANER TIP ELECTROSURG 2X2 (MISCELLANEOUS) ×3 IMPLANT
CLIP TI MEDIUM 6 (CLIP) IMPLANT
CONN ST 1/4X3/8  BEN (MISCELLANEOUS) ×1
CONN ST 1/4X3/8 BEN (MISCELLANEOUS) ×2 IMPLANT
CONT SPEC 4OZ CLIKSEAL STRL BL (MISCELLANEOUS) ×12 IMPLANT
COVER SURGICAL LIGHT HANDLE (MISCELLANEOUS) ×3 IMPLANT
COVER TABLE BACK 60X90 (DRAPES) ×3 IMPLANT
COVER TRANSDUCER ULTRASND GEL (DRAPE) IMPLANT
DERMABOND ADVANCED (GAUZE/BANDAGES/DRESSINGS) ×1
DERMABOND ADVANCED .7 DNX12 (GAUZE/BANDAGES/DRESSINGS) ×2 IMPLANT
DRAIN CHANNEL 32F RND 10.7 FF (WOUND CARE) ×3 IMPLANT
DRAPE C-ARM 42X72 X-RAY (DRAPES) IMPLANT
DRAPE LAPAROSCOPIC ABDOMINAL (DRAPES) ×3 IMPLANT
DRAPE WARM FLUID 44X44 (DRAPE) ×3 IMPLANT
DRILL BIT 7/64X5 (BIT) IMPLANT
ELECT BLADE 4.0 EZ CLEAN MEGAD (MISCELLANEOUS)
ELECT REM PT RETURN 9FT ADLT (ELECTROSURGICAL) ×3
ELECTRODE BLDE 4.0 EZ CLN MEGD (MISCELLANEOUS) IMPLANT
ELECTRODE REM PT RTRN 9FT ADLT (ELECTROSURGICAL) ×2 IMPLANT
FORCEPS BIOP RJ4 1.8 (CUTTING FORCEPS) IMPLANT
GAUZE SPONGE 4X4 12PLY STRL (GAUZE/BANDAGES/DRESSINGS) ×3 IMPLANT
GLOVE BIO SURGEON STRL SZ 6.5 (GLOVE) ×6 IMPLANT
GOWN STRL REUS W/ TWL LRG LVL3 (GOWN DISPOSABLE) ×12 IMPLANT
GOWN STRL REUS W/TWL LRG LVL3 (GOWN DISPOSABLE) ×6
KIT BASIN OR (CUSTOM PROCEDURE TRAY) ×3 IMPLANT
KIT CLEAN ENDO COMPLIANCE (KITS) ×3 IMPLANT
KIT PLEURX DRAIN CATH 1000ML (MISCELLANEOUS) IMPLANT
KIT PLEURX DRAIN CATH 15.5FR (DRAIN) ×3 IMPLANT
KIT ROOM TURNOVER OR (KITS) ×3 IMPLANT
KIT SUCTION CATH 14FR (SUCTIONS) ×6 IMPLANT
MARKER SKIN DUAL TIP RULER LAB (MISCELLANEOUS) ×3 IMPLANT
NEEDLE BIOPSY TRANSBRONCH 21G (NEEDLE) IMPLANT
NEEDLE HYPO 25GX1X1/2 BEV (NEEDLE) ×3 IMPLANT
NS IRRIG 1000ML POUR BTL (IV SOLUTION) ×9 IMPLANT
OIL SILICONE PENTAX (PARTS (SERVICE/REPAIRS)) ×3 IMPLANT
PACK CHEST (CUSTOM PROCEDURE TRAY) ×3 IMPLANT
PACK GENERAL/GYN (CUSTOM PROCEDURE TRAY) ×3 IMPLANT
PAD ARMBOARD 7.5X6 YLW CONV (MISCELLANEOUS) ×6 IMPLANT
PASSER SUT SWANSON 36MM LOOP (INSTRUMENTS) IMPLANT
SEALANT PROGEL (MISCELLANEOUS) IMPLANT
SEALANT SURG COSEAL 4ML (VASCULAR PRODUCTS) IMPLANT
SEALANT SURG COSEAL 8ML (VASCULAR PRODUCTS) IMPLANT
SET DRAINAGE LINE (MISCELLANEOUS) IMPLANT
SOLUTION ANTI FOG 6CC (MISCELLANEOUS) IMPLANT
SPONGE GAUZE 4X4 12PLY STER LF (GAUZE/BANDAGES/DRESSINGS) ×3 IMPLANT
SUT BONE WAX W31G (SUTURE) ×3 IMPLANT
SUT ETHILON 3 0 FSL (SUTURE) ×6 IMPLANT
SUT PROLENE 3 0 SH DA (SUTURE) IMPLANT
SUT PROLENE 4 0 RB 1 (SUTURE)
SUT PROLENE 4-0 RB1 .5 CRCL 36 (SUTURE) IMPLANT
SUT SILK  1 MH (SUTURE) ×2
SUT SILK 1 MH (SUTURE) ×4 IMPLANT
SUT SILK 2 0SH CR/8 30 (SUTURE) IMPLANT
SUT SILK 3 0SH CR/8 30 (SUTURE) IMPLANT
SUT VIC AB 1 CTX 18 (SUTURE) IMPLANT
SUT VIC AB 1 CTX 36 (SUTURE)
SUT VIC AB 1 CTX36XBRD ANBCTR (SUTURE) IMPLANT
SUT VIC AB 2-0 CTX 36 (SUTURE) IMPLANT
SUT VIC AB 3-0 X1 27 (SUTURE) ×6 IMPLANT
SUT VICRYL 2 TP 1 (SUTURE) IMPLANT
SWAB COLLECTION DEVICE MRSA (MISCELLANEOUS) IMPLANT
SYR 20ML ECCENTRIC (SYRINGE) ×3 IMPLANT
SYR CONTROL 10ML LL (SYRINGE) ×3 IMPLANT
SYSTEM SAHARA CHEST DRAIN ATS (WOUND CARE) ×3 IMPLANT
TAPE CLOTH 4X10 WHT NS (GAUZE/BANDAGES/DRESSINGS) ×3 IMPLANT
TAPE CLOTH SURG 4X10 WHT LF (GAUZE/BANDAGES/DRESSINGS) ×3 IMPLANT
TIP APPLICATOR SPRAY EXTEND 16 (VASCULAR PRODUCTS) IMPLANT
TOWEL OR 17X24 6PK STRL BLUE (TOWEL DISPOSABLE) ×3 IMPLANT
TOWEL OR 17X26 10 PK STRL BLUE (TOWEL DISPOSABLE) ×6 IMPLANT
TRAP SPECIMEN MUCOUS 40CC (MISCELLANEOUS) ×6 IMPLANT
TRAY FOLEY CATH 14FRSI W/METER (CATHETERS) ×3 IMPLANT
TROCAR XCEL BLUNT TIP 100MML (ENDOMECHANICALS) IMPLANT
TUBE ANAEROBIC SPECIMEN COL (MISCELLANEOUS) IMPLANT
TUBE CONNECTING 20X1/4 (TUBING) ×3 IMPLANT
WATER STERILE IRR 1000ML POUR (IV SOLUTION) ×6 IMPLANT

## 2014-02-01 NOTE — Transfer of Care (Signed)
Immediate Anesthesia Transfer of Care Note  Patient: Colin Lee  Procedure(s) Performed: Procedure(s): VIDEO ASSISTED THORACOSCOPY (Left) DRAINAGE OF PLEURAL EFFUSION (Left) VIDEO BRONCHOSCOPY (N/A) PLEURAL BIOPSY (Left) INSERTION PLEURAL DRAINAGE CATHETER (Left) TALC PLEURADESIS (Left)  Patient Location: PACU  Anesthesia Type:General  Level of Consciousness: awake, alert , oriented and patient cooperative  Airway & Oxygen Therapy: Patient Spontanous Breathing and Patient connected to face mask oxygen  Post-op Assessment: Report given to PACU RN, Post -op Vital signs reviewed and stable and Patient moving all extremities  Post vital signs: Reviewed and stable  Complications: No apparent anesthesia complications

## 2014-02-01 NOTE — H&P (Signed)
[address]       [place] [address]             [phone]          HPI: Colin Lee returns to the office today for follow up of a recurrent left pleural effusion.  He has been admitted to the hospital twice in the last 3 months with shortness of breath and fever.  He has been treated for community acquired pneumonia and has undergone 3 thoracenteses for a left parapneumonic effusion.  During his most recent hospital stay, TCTS was consulted for possible VATS/drainage of empyema.  At the time, the fluid had been drained via thoracentesis and did not appear to be infected.  Fluid cytology from the initial episode showed some reactive mesothelial cells, but more recent cytologies showed inflammation, and all cultures have been negative.  Echocardiogram showed normal EF with no other abnormalities.  ANA and c-ANA were positive and titers were elevated.  The patient is scheduled for rheumatology evaluation on 11/19.    He was seen in the office in follow up 2 weeks ago, and at that time was essentially asymptomatic.  However, over the past week, he has developed increasing dyspnea, orthopnea and dyspnea on exertion, with occasional chest discomfort.  He also reports occasional nonproductive cough, as well as subjective fevers, daily headaches, arthralgias and myalgias, and generalized malaise.  Additionally, the patient reports that he bruises easily. The patient returns to the office today for further evaluation.  Past Medical History: Past Medical History  Diagnosis Date  . GERD (gastroesophageal reflux disease)   . Hypertension   . Shortness of breath     Starting May 2015  . Headache     stress and tension  . Neuromuscular disorder     restless legs and leg cramps  . Arthritis     Past Surgical History: Past Surgical History  Procedure Laterality Date  . No prior surgery    . Thoracentesis Left 2015  . Colonoscopy w/ biopsies and polypectomy  2014   benign   Social History: History   Social History  . Marital Status: Married    Spouse Name: N/A    Number of Children: N/A  . Years of Education: N/A   Occupational History  . Not on file.   Social History Main Topics  . Smoking status: Former Smoker -- 1.00 packs/day for 30 years    Types: Cigarettes    Quit date: 04/08/2000  . Smokeless tobacco: Never Used  . Alcohol Use: Yes     Comment: rare  . Drug Use: No  . Sexual Activity: Not on file     Family History: Family History  Problem Relation Age of Onset  . Adopted: Yes    Medications:  Current Facility-Administered Medications  Medication Dose Route Frequency Provider Last Rate Last Dose  . cefUROXime (ZINACEF) 1.5 g in dextrose 5 % 50 mL IVPB  1.5 g Intravenous 60 min Pre-Op Grace Isaac, MD      . talc 3 g in sodium chloride 0.9 % syringe  3 g Intrapleural Once Grace Isaac, MD        Review of Systems: Review of Systems  Constitutional: Positive for fever, chills, weight loss and malaise/fatigue. Negative for diaphoresis.  HENT: Negative for congestion, ear discharge, ear pain, hearing loss, nosebleeds, sore throat and tinnitus.   Eyes: Negative for blurred vision, double vision and pain.  Respiratory: Positive  for cough and shortness of breath. Negative for hemoptysis, sputum production, wheezing and stridor.   Cardiovascular: Positive for chest pain and orthopnea. Negative for palpitations, claudication, leg swelling and PND.  Gastrointestinal: Negative for heartburn, nausea, vomiting, abdominal pain, diarrhea, constipation and blood in stool.  Genitourinary: Negative for dysuria, urgency, frequency and hematuria.  Musculoskeletal: Positive for joint pain and myalgias. Negative for back pain, falls and neck pain.  Skin: Negative for itching and rash.  Neurological: Positive for weakness and headaches. Negative for dizziness, tremors, focal weakness and seizures.  Endo/Heme/Allergies: Negative for  environmental allergies and polydipsia. Bruises/bleeds easily.  Psychiatric/Behavioral: Negative for depression. The patient is not nervous/anxious.      Physical Exam: BP 154/95 HR 93 Sats 96% on room air Heart: regular rate and rhythm Lungs: Diminished breath sounds in left base Extremities: No lower extremity edema No abdominal masses dp and pt pulses No cervical or axillary adenopathy  Diagnostic Tests: Chest xray: Dg Chest 2 View  01/31/2014   CLINICAL DATA:  Recurrent pleural effusions. Persistent cough and shortness of breath  EXAM: CHEST  2 VIEW  COMPARISON:  January 10, 2014  FINDINGS: There is a left pleural effusion which appears at least partially loculated. This effusion is larger compared to recent prior study. There is patchy consolidation in the left base as well. On the right, there is minimal right base atelectasis. There is evidence of underlying emphysema. Elsewhere lungs are clear. Heart size and pulmonary vascularity are normal. No adenopathy. There is degenerative change in the thoracic spine.  IMPRESSION: Effusion on the left is at least partially loculated in larger compared to recent prior study. There is patchy consolidation in the left base. There is underlying emphysema. There is minimal atelectasis in the right base. Right lung otherwise appears clear. No change in cardiac silhouette.   Electronically Signed   By: Lowella Grip M.D.   On: 01/31/2014 13:14   Lab Results  Component Value Date   WBC 10.8* 01/31/2014   HGB 11.9* 01/31/2014   HCT 35.5* 01/31/2014   PLT 349 01/31/2014   GLUCOSE 100* 01/31/2014   CHOL 145 12/29/2013   TRIG 129 12/29/2013   HDL 25* 12/29/2013   LDLCALC 94 12/29/2013   ALT 28 01/31/2014   AST 22 01/31/2014   NA 140 01/31/2014   K 4.0 01/31/2014   CL 102 01/31/2014   CREATININE 0.89 01/31/2014   BUN 15 01/31/2014   CO2 24 01/31/2014   TSH 1.410 12/27/2013   INR 1.03 01/31/2014   HGBA1C 6.1* 10/06/2013   See auto immune  labs 9/23 and 7/1   Assessment/Plan: I have seen the patient   the patient today and reviewed his chest  X-ray.  He has a recurrent left pleural effusion, the etiology of which is unclear.  Because of the rapid reaccumulation of fluid after multiple thoracenteses, it is felt that he would benefit from surgical intervention at this time.  We plan a bronchoscopy , left VATS, drainage of left pleural effusion, pleural biopsies, possible talc pleurodesis  and placement of pleurix catheter.  All risks, benefits and alternatives of surgery have been explained to the patient in detail and he agrees to proceed.  The goals risks and alternatives of the planned surgical procedure bronchoscopy , left VATS, drainage of left pleural effusion, pleural biopsies, possible talc pleurodesis  and placement of pleurix catheter  have been discussed with the patient in detail. The risks of the procedure including death, infection, stroke, myocardial infarction,  bleeding, blood transfusion have all been discussed specifically.  I have quoted Colin Lee a 1 % of perioperative mortality and a complication rate as high as 20 %. The patient's questions have been answered.Colin Lee is willing  to proceed with the planned procedure.  Grace Isaac MD      [address] [place] [address] Office [phone]   Beeper [phone]

## 2014-02-01 NOTE — Progress Notes (Signed)
Utilization Review Completed.Radha Coggins T10/27/2015  

## 2014-02-01 NOTE — Consult Note (Addendum)
[facility] Consult Note  [name] BTD:[id number] DOB: 03/02/1961 DOA: 02/01/2014  Referring physician: Dr Roxy Horseman PCP: Bronson Curb, PA-C  Specialists: Dr Luan Pulling  Chief Complaint: shortness of breath  HPI: Colin Lee is a 53 y.o. male past medical history of hypertension, GERD, cholelithiasis, hepatic steatosis and recurrent pleural effusions who was admitted by cardiothoracic service for pleurocentesis. Medical team has been asked to assist with management of his medical issues. His effusion was initially tapped on 10/06/2013. He was then admitted to the hospital on 10/26/2013 and discharged on 10/30/2013. That time it was suspected that he had a acquired pneumonia and a parapneumonic effusion. He underwent a repeat thoracentesis on 10/26/13 for recurrence of the effusion. He returned to the ER on 12/27/2013. 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 He is scheduled for rheumatology eval in November. Was admitted today by cardiothoracic surgery with recurrence of effusion and underwent VATS with drainage of pleural effusion, talc pleurodesis and insertion of Pleurx catheter.   General: The patient denies anorexia, having fevers of 100 -100.1, weight loss Cardiac: Denies chest pain, syncope, palpitations, pedal edema  Respiratory: Denies cough, shortness of breath, wheezing GI: Denies severe indigestion/heartburn, abdominal pain, no  vomiting, diarrhea and constipation- mild nausea right now GU: Denies hematuria, incontinence, dysuria  Musculoskeletal: + arthritis - takes Ibuprofen Skin: Denies suspicious skin lesions Neurologic: Denies focal weakness or numbness, change in vision  Past Medical History  Diagnosis Date  . GERD (gastroesophageal reflux disease)   . Hypertension   . Shortness of breath     Starting May 2015  . Headache     stress and tension  . Neuromuscular disorder     restless  legs and leg cramps  . Arthritis     Past Surgical History  Procedure Laterality Date  . No prior surgery    . Thoracentesis Left 2015  . Colonoscopy w/ biopsies and polypectomy  2014    benign    Social History: Quit smoking over 10 years ago. He does not drink any alcohol. 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 Adopted: Yes      Prior to Admission medications   Medication Sig Start Date End Date Taking? Authorizing Provider  acetaminophen (TYLENOL) 500 MG tablet Take 500 mg by mouth every 4 (four) hours as needed.   Yes Historical Provider, MD  albuterol (PROVENTIL HFA;VENTOLIN HFA) 108 (90 BASE) MCG/ACT inhaler Inhale 2 puffs into the lungs every 6 (six) hours as needed for wheezing or shortness of breath.   Yes Historical Provider, MD  ibuprofen (ADVIL,MOTRIN) 800 MG tablet Take 800 mg by mouth every 8 (eight) hours as needed.   Yes Historical Provider, MD  losartan (COZAAR) 50 MG tablet Take 50 mg by mouth daily.   Yes Historical Provider, MD  omeprazole (PRILOSEC) 20 MG capsule Take 20 mg by mouth daily.   Yes Historical Provider, MD  oxycodone (OXY-IR) 5 MG capsule Take 5 mg by mouth every 4 (four) hours as needed.   Yes Historical Provider, MD  polyethylene glycol (MIRALAX / GLYCOLAX) packet Take 17 g by mouth daily. 10/30/13  Yes Samuella Cota, MD  rOPINIRole (REQUIP) 0.25 MG tablet Take 1 tablet (0.25 mg total) by mouth at bedtime. 10/11/13  Yes Kathie Dike, MD     Physical  Exam: Filed Vitals:   02/01/14 1200 02/01/14 1207 02/01/14 1215 02/01/14 1300  BP:  120/76  131/71  Pulse: 84  85 79  Temp:   97.7 F (36.5 C)   TempSrc:      Resp:   15 12  Height:      Weight:      SpO2:   96% 92%     General: AAO x 3, no distress HEENT: Normocephalic and Atraumatic, Mucous membranes pink                PERRLA; EOM intact; No scleral icterus,                  Nares: Patent, Oropharynx: Clear, Fair Dentition                 Neck: FROM, no cervical lymphadenopathy, thyromegaly, carotid bruit or JVD;  Breasts: deferred CHEST WALL: No tenderness  CHEST: crackles and decreased BS in left lung base HEART: Regular rate and rhythm; no murmurs rubs or gallops  BACK: No kyphosis or scoliosis; no CVA tenderness  ABDOMEN: Positive Bowel Sounds, soft, non-tender; no masses, no organomegaly Rectal Exam: deferred EXTREMITIES: No cyanosis, clubbing, or edema Genitalia: not examined  SKIN:  no rash or ulceration  CNS: Alert and Oriented x 4, Nonfocal exam, CN 2-12 intact  Labs on Admission:  Basic Metabolic Panel:  Recent Labs Lab 01/31/14 1611  NA 140  K 4.0  CL 102  CO2 24  GLUCOSE 100*  BUN 15  CREATININE 0.89  CALCIUM 9.1   Liver Function Tests:  Recent Labs Lab 01/31/14 1611  AST 22  ALT 28  ALKPHOS 81  BILITOT 0.3  PROT 7.9  ALBUMIN 3.2*   No results found for this basename: LIPASE, AMYLASE,  in the last 168 hours No results found for this basename: AMMONIA,  in the last 168 hours CBC:  Recent Labs Lab 01/31/14 1611  WBC 10.8*  HGB 11.9*  HCT 35.5*  MCV 80.0  PLT 349   Cardiac Enzymes: No results found for this basename: CKTOTAL, CKMB, CKMBINDEX, TROPONINI,  in the last 168 hours  BNP (last 3 results)  Recent Labs  10/06/13 1942  PROBNP 79.8   CBG: No results found for this basename: GLUCAP,  in the last 168 hours  Radiological Exams on Admission: Dg Chest 2 View  01/31/2014   CLINICAL DATA:  Recurrent pleural effusions. Persistent cough and shortness of breath  EXAM: CHEST  2 VIEW  COMPARISON:  January 10, 2014  FINDINGS: There is a left pleural effusion which appears at least partially loculated. This effusion is larger compared to recent prior study. There is patchy consolidation in the left base as well. On the right, there is minimal right base atelectasis. There is evidence of underlying emphysema. Elsewhere  lungs are clear. Heart size and pulmonary vascularity are normal. No adenopathy. There is degenerative change in the thoracic spine.  IMPRESSION: Effusion on the left is at least partially loculated in larger compared to recent prior study. There is patchy consolidation in the left base. There is underlying emphysema. There is minimal atelectasis in the right base. Right lung otherwise appears clear. No change in cardiac silhouette.   Electronically Signed   By: Lowella Grip M.D.   On: 01/31/2014 13:14   Dg Chest Port 1 View  02/01/2014   CLINICAL DATA:  Pleural effusion  EXAM: PORTABLE CHEST - 1 VIEW  COMPARISON:  Portable exam 1118 hr compared to  01/31/2014  FINDINGS: New RIGHT jugular central venous catheter tip projecting over SVC.  New LEFT thoracostomy tube.  Enlargement of cardiac silhouette with pulmonary vascular congestion.  Decreased LEFT pleural effusion and basilar atelectasis.  Mild RIGHT basilar atelectasis.  No pneumothorax.  Bones unremarkable.  IMPRESSION: Decrease in LEFT pleural effusion and atelectasis post chest tube placement.  No pneumothorax.   Electronically Signed   By: Lavonia Dana M.D.   On: 02/01/2014 11:46    EKG: Independently reviewed. NSR   Assessment/Plan Principal Problem:   Pleural effusion- recurrently sp VATS and pleurodesis - has had exudative effusions with elevated lymphocytes and monocytes- cultures have been negative - work up has revealed a probable rheumatologic etiology- pt has appt in Nov- will not pursue further rheum work up here -AFB smears and cultures have been negative- although there is a low suspicion for TB, will check Quantiferron gold and pleural fluid deaminase- pleural biopsy may be useful  Active Problems:  COPD? Asthma? - he is a former smoker and states he has no formal diagnosis of COPD but needs to use an inhaler for his "wheezing"     Hypertension - resume Losartan    Restless leg syndrome -resume requip   Code Status:  Full code Family Communication: none DVT Prophylaxis:SCDs  Time spent: >45  Debbe Odea, MD [facility]  If 7PM-7AM, please contact night-coverage [email or web address] 02/01/2014, 2:51 PM

## 2014-02-01 NOTE — Anesthesia Preprocedure Evaluation (Addendum)
Anesthesia Evaluation  Patient identified by MRN, date of birth, ID band Patient awake    Reviewed: Allergy & Precautions, H&P , NPO status , Patient's Chart, lab work & pertinent test results  Airway Mallampati: III  TM Distance: >3 FB Neck ROM: Full    Dental  (+) Teeth Intact, Dental Advisory Given   Pulmonary shortness of breath, pneumonia -, unresolved, former smoker,  Left sided pleural effusion. breath sounds clear to auscultation        Cardiovascular hypertension, Pt. on medications Rhythm:Regular Rate:Normal     Neuro/Psych  Headaches, negative psych ROS   GI/Hepatic Neg liver ROS, GERD-  ,  Endo/Other  Morbid obesity  Renal/GU negative Renal ROS     Musculoskeletal  (+) Arthritis -,   Abdominal   Peds  Hematology  (+) anemia ,   Anesthesia Other Findings   Reproductive/Obstetrics                            Anesthesia Physical Anesthesia Plan  ASA: III  Anesthesia Plan: General   Post-op Pain Management:    Induction: Intravenous  Airway Management Planned: Oral ETT and Double Lumen EBT  Additional Equipment: CVP, Ultrasound Guidance Line Placement and Arterial line  Intra-op Plan:   Post-operative Plan: Extubation in OR  Informed Consent: I have reviewed the patients History and Physical, chart, labs and discussed the procedure including the risks, benefits and alternatives for the proposed anesthesia with the patient or authorized representative who has indicated his/her understanding and acceptance.   Dental advisory given  Plan Discussed with: CRNA and Surgeon  Anesthesia Plan Comments:        Anesthesia Quick Evaluation

## 2014-02-01 NOTE — Brief Op Note (Addendum)
      [address]       [place] [address]             [phone]    02/01/2014  10:47 AM  PATIENT:  Margarette Canada  53 y.o. male  PRE-OPERATIVE DIAGNOSIS:  RECURRENT LEFT PLEURAL EFFUSION  POST-OPERATIVE DIAGNOSIS:  RECURRENT LEFT PLEURAL EFFUSION, final path pending  PROCEDURE:  VIDEO BRONCHOSCOPY  LEFT VIDEO ASSISTED THORACOSCOPY  DRAINAGE OF PLEURAL EFFUSION  PLEURAL BIOPSIES  TALC PLEURODESIS INSERTION PLEURX CATHETER   SURGEON:  Grace Isaac, MD  ASSISTANT: Suzzanne Cloud, PA-C  ANESTHESIA:   general  SPECIMEN:  Source of Specimen:  left pleural fluid, pleural biopsies  DISPOSITION OF SPECIMEN:  Pathology  DRAINS: 43 Blake drain an pleurix drain  PATIENT CONDITION:  PACU - hemodynamically stable.

## 2014-02-01 NOTE — Anesthesia Postprocedure Evaluation (Signed)
  Anesthesia Post-op Note  Patient: Colin Lee  Procedure(s) Performed: Procedure(s): VIDEO ASSISTED THORACOSCOPY (Left) DRAINAGE OF PLEURAL EFFUSION (Left) VIDEO BRONCHOSCOPY (N/A) PLEURAL BIOPSY (Left) INSERTION PLEURAL DRAINAGE CATHETER (Left) TALC PLEURADESIS (Left)  Patient Location: PACU  Anesthesia Type:General  Level of Consciousness: awake, alert  and oriented  Airway and Oxygen Therapy: Patient Spontanous Breathing  Post-op Pain: none  Post-op Assessment: Post-op Vital signs reviewed  Post-op Vital Signs: Reviewed  Last Vitals:  Filed Vitals:   02/01/14 1215  BP:   Pulse: 85  Temp: 36.5 C  Resp: 15    Complications: No apparent anesthesia complications

## 2014-02-01 NOTE — Anesthesia Procedure Notes (Addendum)
Procedure Name: Intubation Date/Time: 02/01/2014 9:01 AM Performed by: Julian Reil Pre-anesthesia Checklist: Patient identified, Emergency Drugs available, Suction available and Patient being monitored Patient Re-evaluated:Patient Re-evaluated prior to inductionOxygen Delivery Method: Circle system utilized Preoxygenation: Pre-oxygenation with 100% oxygen Intubation Type: IV induction Ventilation: Mask ventilation without difficulty and Oral airway inserted - appropriate to patient size Laryngoscope Size: Mac and 4 Grade View: Grade I Tube type: Oral Tube size: 8.5 mm Number of attempts: 1 Airway Equipment and Method: Stylet Placement Confirmation: ETT inserted through vocal cords under direct vision,  positive ETCO2 and breath sounds checked- equal and bilateral Secured at: 21 cm Tube secured with: Tape Dental Injury: Teeth and Oropharynx as per pre-operative assessment

## 2014-02-01 NOTE — Progress Notes (Signed)
Patient ID: Colin Lee, male   DOB: 03/01/1961, 53 y.o.   MRN: [id number] EVENING ROUNDS NOTE :     [address]       [place] [address]             [phone]                 Day of Surgery Procedure(s) (LRB): VIDEO ASSISTED THORACOSCOPY (Left) DRAINAGE OF PLEURAL EFFUSION (Left) VIDEO BRONCHOSCOPY (N/A) PLEURAL BIOPSY (Left) INSERTION PLEURAL DRAINAGE CATHETER (Left) TALC PLEURADESIS (Left)  Total Length of Stay:  LOS: 0 days  BP 131/71  Pulse 79  Temp(Src) 98.1 F (36.7 C) (Oral)  Resp 16  Ht 5\' 10"  (1.778 m)  Wt 274 lb (124.286 kg)  BMI 39.32 kg/m2  SpO2 95%  .Intake/Output     10/26 0701 - 10/27 0700 10/27 0701 - 10/28 0700   P.O.  50   I.V. (mL/kg)  1200 (9.7)   Total Intake(mL/kg)  1250 (10.1)   Urine (mL/kg/hr)  405 (0.4)   Blood  50 (0)   Chest Tube  100 (0.1)   Total Output   555   Net   +695          . dextrose 5 % and 0.45 % NaCl with KCl 20 mEq/L 100 mL/hr at 02/01/14 1220     Lab Results  Component Value Date   WBC 10.8* 01/31/2014   HGB 11.9* 01/31/2014   HCT 35.5* 01/31/2014   PLT 349 01/31/2014   GLUCOSE 100* 01/31/2014   CHOL 145 12/29/2013   TRIG 129 12/29/2013   HDL 25* 12/29/2013   LDLCALC 94 12/29/2013   ALT 28 01/31/2014   AST 22 01/31/2014   NA 140 01/31/2014   K 4.0 01/31/2014   CL 102 01/31/2014   CREATININE 0.89 01/31/2014   BUN 15 01/31/2014   CO2 24 01/31/2014   TSH 1.410 12/27/2013   INR 1.03 01/31/2014   HGBA1C 6.1* 10/06/2013   Stable post op, only 100 ml in pleurvac, 1400 ml total drained Good pain control  Grace Isaac MD  Beeper [phone] Office [phone] 02/01/2014 4:04 PM

## 2014-02-02 ENCOUNTER — Inpatient Hospital Stay (HOSPITAL_COMMUNITY): Payer: BC Managed Care – PPO

## 2014-02-02 ENCOUNTER — Encounter (HOSPITAL_COMMUNITY): Payer: Self-pay | Admitting: Cardiothoracic Surgery

## 2014-02-02 LAB — BASIC METABOLIC PANEL
Anion gap: 14 (ref 5–15)
BUN: 15 mg/dL (ref 6–23)
CO2: 24 mEq/L (ref 19–32)
Calcium: 9 mg/dL (ref 8.4–10.5)
Chloride: 98 mEq/L (ref 96–112)
Creatinine, Ser: 0.78 mg/dL (ref 0.50–1.35)
GFR calc Af Amer: >90 mL/min (ref >90)
GFR calc non Af Amer: >90 mL/min (ref >90)
Glucose, Bld: 160 mg/dL — ABNORMAL HIGH (ref 70–99)
Potassium: 4.3 mEq/L (ref 3.7–5.3)
Sodium: 136 mEq/L — ABNORMAL LOW (ref 137–147)

## 2014-02-02 LAB — CBC
HCT: 34.1 % — ABNORMAL LOW (ref 39.0–52.0)
Hemoglobin: 11.3 g/dL — ABNORMAL LOW (ref 13.0–17.0)
MCH: 26.7 pg (ref 26.0–34.0)
MCHC: 33.1 g/dL (ref 30.0–36.0)
MCV: 80.6 fL (ref 78.0–100.0)
Platelets: 398 10*3/uL (ref 150–400)
RBC: 4.23 MIL/uL (ref 4.22–5.81)
RDW: 13.2 % (ref 11.5–15.5)
WBC: 16.8 10*3/uL — ABNORMAL HIGH (ref 4.0–10.5)

## 2014-02-02 LAB — BLOOD GAS, ARTERIAL
Acid-Base Excess: 0.2 mmol/L (ref 0.0–2.0)
Bicarbonate: 24.7 mEq/L — ABNORMAL HIGH (ref 20.0–24.0)
O2 Content: 2 L/min
O2 Saturation: 96.6 %
Patient temperature: 98.6
TCO2: 26 mmol/L (ref 0–100)
pCO2 arterial: 42.7 mmHg (ref 35.0–45.0)
pH, Arterial: 7.38 (ref 7.350–7.450)
pO2, Arterial: 83.7 mmHg (ref 80.0–100.0)

## 2014-02-02 LAB — PATHOLOGIST SMEAR REVIEW

## 2014-02-02 MED ORDER — FAMOTIDINE 20 MG PO TABS
20.0000 mg | ORAL_TABLET | Freq: Two times a day (BID) | ORAL | Status: DC | PRN
  Administered 2014-02-02 – 2014-02-03 (×2): 20 mg via ORAL
  Filled 2014-02-02 (×2): qty 1

## 2014-02-02 MED ORDER — ACETAMINOPHEN 160 MG/5ML PO SOLN
650.0000 mg | Freq: Four times a day (QID) | ORAL | Status: DC
  Filled 2014-02-02: qty 20.3

## 2014-02-02 MED ORDER — POLYETHYLENE GLYCOL 3350 17 G PO PACK
17.0000 g | PACK | Freq: Two times a day (BID) | ORAL | Status: DC
  Administered 2014-02-02 – 2014-02-03 (×3): 17 g via ORAL
  Filled 2014-02-02 (×5): qty 1

## 2014-02-02 MED ORDER — ENOXAPARIN SODIUM 30 MG/0.3ML ~~LOC~~ SOLN
30.0000 mg | SUBCUTANEOUS | Status: DC
  Administered 2014-02-02 – 2014-02-04 (×3): 30 mg via SUBCUTANEOUS
  Filled 2014-02-02 (×3): qty 0.3

## 2014-02-02 MED ORDER — SODIUM CHLORIDE 0.9 % IV SOLN
INTRAVENOUS | Status: DC
  Administered 2014-02-02: 10:00:00 via INTRAVENOUS

## 2014-02-02 MED ORDER — ACETAMINOPHEN 325 MG PO TABS
650.0000 mg | ORAL_TABLET | Freq: Four times a day (QID) | ORAL | Status: DC
  Administered 2014-02-02 – 2014-02-04 (×8): 650 mg via ORAL
  Filled 2014-02-02 (×6): qty 2

## 2014-02-02 NOTE — Progress Notes (Signed)
[facility] TEAM 1 - Stepdown/ICU TEAM CONSULT F/U NOTE  CARLA WHILDEN OZH:[id number] DOB: February 26, 1961 DOA: 02/01/2014 PCP: Bronson Curb, PA-C  Admit HPI / Brief Narrative: 53 yo male w/ a history of hypertension, GERD, cholelithiasis, hepatic steatosis, and recurrent pleural effusions who was admitted by TCTS for pleurocentesis. Medical team has been asked to assist with management of his medical issues. His effusion was initially tapped on 10/06/2013. He was then admitted to the hospital on 10/26/2013 and discharged on 10/30/2013. That time it was suspected that he had a acquired pneumonia and a parapneumonic effusion. He underwent a repeat thoracentesis on 10/26/13 for recurrence of the effusion. He returned to the ER on 12/27/2013. 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 He is scheduled for Rheumatology eval in November.   Was admitted 10/27 by TCTS with recurrence of effusion and underwent VATS with drainage of pleural effusion, talc pleurodesis and insertion of Pleurx catheter.  HPI/Subjective: Pt c/o expected pain at his chest tube insertion site, but is o/w w/o complaints.  He does report that he feels mildly constipated.  No sob, n/v, or ha.    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  GERD  Code Status: FULL Family Communication: spoke w/ wife at bedside  Disposition Plan: per primary service ([facility])  Procedures: 10/28 - Bronchoscopy, left video-assisted thoracoscopy, drainage of left pleural effusion, pleural biopsies, talc pleurodesis, placement of chest tube and PleurX  catheter  Antibiotics: Cefuroxime 10/27 >   DVT prophylaxis: SCDs + lovenox   Objective: Blood pressure 139/77, pulse 79, temperature 97.7 F (36.5 C), temperature source Oral, resp. rate 14, height 5\' 10"  (1.778 m), weight 121.927 kg (268 lb 12.8 oz), SpO2 95.00%.  Intake/Output Summary (Last 24 hours) at 02/02/14 0911 Last data filed at 02/02/14 0830  Gross per 24 hour  Intake 4293.17 ml  Output   2950 ml  Net 1343.17 ml    Exam: General: No acute respiratory distress Lungs: Clear to auscultation bilaterally without wheezes or crackles but w/ poor air movement in B bases  Cardiovascular: Regular rate and rhythm without murmur gallop or rub normal S1 and S2 Abdomen: Nontender, obese, soft, bowel sounds positive, no rebound, no ascites, no appreciable mass Extremities: No significant cyanosis, clubbing, or edema bilateral lower extremities  Data Reviewed: Basic Metabolic Panel:  Recent Labs Lab 01/31/14 1611 02/02/14 0330  NA 140 136*  K 4.0 4.3  CL 102 98  CO2 24 24  GLUCOSE 100* 160*  BUN 15 15  CREATININE 0.89 0.78  CALCIUM 9.1 9.0    Liver Function Tests:  Recent Labs Lab 01/31/14 1611  AST 22  ALT 28  ALKPHOS 81  BILITOT 0.3  PROT 7.9  ALBUMIN 3.2*    Coags:  Recent Labs Lab 01/31/14 1611  INR 1.03    Recent Labs Lab 01/31/14 1611  APTT 29    CBC:  Recent Labs Lab 01/31/14 1611 02/02/14 0330  WBC 10.8* 16.8*  HGB 11.9* 11.3*  HCT 35.5* 34.1*  MCV 80.0 80.6  PLT 349 398    Recent Results (from the past  240 hour(s))  SURGICAL PCR SCREEN     Status: None   Collection Time    01/31/14  4:10 PM      Result Value Ref Range Status   MRSA, PCR NEGATIVE  NEGATIVE Final   Staphylococcus aureus NEGATIVE  NEGATIVE Final   Comment:            The Xpert SA Assay (FDA     approved for NASAL specimens     in patients over 55 years of age),     is one component of     a comprehensive surveillance     program.  Test performance has      been validated by [employer] for patients greater     than or equal to 23 year old.     It is not intended     to diagnose infection nor to     guide or monitor treatment.  BODY FLUID CULTURE     Status: None   Collection Time    02/01/14 10:15 AM      Result Value Ref Range Status   Specimen Description PLEURAL LEFT   Final   Special Requests PT ON ZINACEF   Final   Gram Stain     Final   Value: RARE WBC PRESENT, PREDOMINANTLY MONONUCLEAR     NO ORGANISMS SEEN     Performed at [facility]   Culture PENDING   Incomplete   Report Status PENDING   Incomplete     Studies:  Recent x-ray studies have been reviewed in detail by the Attending Physician  Scheduled Meds:  Scheduled Meds: . acetaminophen  1,000 mg Oral 4 times per day   Or  . acetaminophen (TYLENOL) oral liquid 160 mg/5 mL  1,000 mg Oral 4 times per day  . antiseptic oral rinse  7 mL Mouth Rinse BID  . bisacodyl  10 mg Oral Daily  . enoxaparin (LOVENOX) injection  30 mg Subcutaneous Q24H  . fentaNYL   Intravenous 6 times per day  . losartan  50 mg Oral Daily  . polyethylene glycol  17 g Oral Daily  . rOPINIRole  0.25 mg Oral QHS  . senna-docusate  1 tablet Oral QHS    Time spent on care of this patient: 35 mins   MCCLUNG,JEFFREY T , MD   [facility] Office  [phone] Pager - Text Page per Shea Evans as per below:  On-Call/Text Page:      [email or web address]      password [email or web address]  If 7PM-7AM, please contact night-coverage [email or web address] Password [email or web address] 02/02/2014, 9:11 AM   LOS: 1 day

## 2014-02-02 NOTE — Op Note (Signed)
NAMEAUTHOR, HATLESTAD           ACCOUNT NO.:  [id number]  MEDICAL RECORD NO.:  [id number]  LOCATION:  2S07C                        FACILITY:  [facility]  PHYSICIAN:  Lanelle Bal, MD    DATE OF BIRTH:  [birth year]  DATE OF PROCEDURE:  02/01/2014 DATE OF DISCHARGE:                              OPERATIVE REPORT   PREOPERATIVE DIAGNOSIS:  Recurrent left pleural effusion.  POSTOPERATIVE DIAGNOSIS:  Recurrent left pleural effusion.  Final pathology pending.  PROCEDURE PERFORMED:  Bronchoscopy, left video-assisted thoracoscopy, drainage of left pleural effusion, pleural biopsies, talc pleurodesis, placement of chest tube and PleurX catheter.  SURGEON:  Lanelle Bal, MD  FIRST ASSISTANT:  Suzzanne Cloud, PA  BRIEF HISTORY:  The patient is a 53 year old male who has been evaluated in July and again in September with recurrent left pleural effusions. The previous drainage with thoracentesis showed no malignancy both in July and in September.  The patient was noted to have positive ANA, had been referred to a rheumatologist by the Medical Service, however, he has still not been seen by the rheumatologist.  He was seen in Thoracic Surgery office 2 weeks ago doing well after previous thoracentesis with relatively clear x-ray.  Reported yesterday that he became increasingly short of breath and chest x-ray showed recurrence of left pleural effusion.  Because of the undefined diagnosis of the cause and the recurrent pleural effusions, left video thoracoscopy with drainage, pleural biopsy was recommended to the patient who agreed and signed informed consent.  DESCRIPTION OF PROCEDURE:  The patient underwent general endotracheal anesthesia with a single-lumen endotracheal tube.  Through this tube, a fiberoptic bronchoscope was passed to the subsegmental level in  both the left and right tracheobronchial tree without evidence of endobronchial lesions.  The scope was removed.  A  double-lumen endotracheal tube was placed.  The patient was turned in lateral decubitus position with the left side up.  Appropriate markings preoperatively were visible.  The left chest was prepped with Betadine and draped in usual sterile manner.  Second time-out was performed.  We then made a single port incision at the posterior axillary line at approximately the seventh intercostal space.  Through this a port was placed and at least 1400 mL of yellow fluid was evacuated.  The chest was examined and there were areas of pleural thickening.  There was no significant peel on the lung itself that would prevent reexpansion. Multiple pleural biopsies were obtained.  One specimen was frozen and was suggestive of lymphoid inflammatory infiltrate in the subpleural space, no diagnosis of malignancy was evident.  A 5 g of talc powder was then insufflated into the left chest and PleurX catheter under direct vision was then tunneled anteriorly and placed through a separate site and positioned posteriorly in the chest.  A single 28 Blake drain was left through the port site and secured in place.  The lung was reinflated.  The patient was awakened and extubated.  The sponge and needle count was reported as correct. Blood loss was minimal.  The patient tolerated the procedure without obvious complication, extubated, and was transferred to the recovery room for postoperative care.     Lanelle Bal, MD     EG/MEDQ  D:  02/02/2014  T:  02/02/2014  Job:  [id number]

## 2014-02-02 NOTE — Progress Notes (Signed)
Patient ID: Colin Lee, male   DOB: 21-Dec-1960, 53 y.o.   MRN: [id number] [facility] DAILY ICU PROGRESS NOTE                   [address]            [place] [address]          [phone]   1 Day Post-Op Procedure(s) (LRB): VIDEO ASSISTED THORACOSCOPY (Left) DRAINAGE OF PLEURAL EFFUSION (Left) VIDEO BRONCHOSCOPY (N/A) PLEURAL BIOPSY (Left) INSERTION PLEURAL DRAINAGE CATHETER (Left) TALC PLEURADESIS (Left)  Total Length of Stay:  LOS: 1 day   Subjective: Feels well  Objective: Vital signs in last 24 hours: Temp:  [97.7 F (36.5 C)-98.2 F (36.8 C)] 97.7 F (36.5 C) (10/28 0740) Pulse Rate:  [69-95] 86 (10/28 0700) Cardiac Rhythm:  [-] Normal sinus rhythm (10/28 0730) Resp:  [10-23] 17 (10/28 0740) BP: (118-157)/(65-90) 141/74 mmHg (10/28 0700) SpO2:  [92 %-100 %] 95 % (10/28 0740) Arterial Line BP: (131-163)/(61-78) 153/75 mmHg (10/28 0700) Weight:  [268 lb 12.8 oz (121.927 kg)] 268 lb 12.8 oz (121.927 kg) (10/28 0600)  Filed Weights   02/01/14 0739 02/02/14 0600  Weight: 274 lb (124.286 kg) 268 lb 12.8 oz (121.927 kg)    Weight change:    Hemodynamic parameters for last 24 hours:    Intake/Output from previous day: 10/27 0701 - 10/28 0700 In: 4143.2 [P.O.:980; I.V.:3113.2; IV Piggyback:50] Out: 2680 [Urine:2320; Blood:50; Chest Tube:310]  Intake/Output this shift: Total I/O In: 150 [I.V.:100; IV Piggyback:50] Out: 150 [Urine:150]  Current Meds: Scheduled Meds: . acetaminophen  1,000 mg Oral 4 times per day   Or  . acetaminophen (TYLENOL) oral liquid 160 mg/5 mL  1,000 mg Oral 4 times per day  . antiseptic oral rinse  7 mL Mouth Rinse BID  . bisacodyl  10 mg Oral Daily  . enoxaparin (LOVENOX) injection  30 mg Subcutaneous Q24H  . fentaNYL   Intravenous 6 times per day  . losartan  50 mg Oral Daily  . polyethylene glycol  17 g Oral Daily  . rOPINIRole  0.25 mg Oral QHS  . senna-docusate  1 tablet Oral QHS   Continuous  Infusions: . dextrose 5 % and 0.45 % NaCl with KCl 20 mEq/L 100 mL/hr at 02/02/14 0700   PRN Meds:.albuterol, diphenhydrAMINE, diphenhydrAMINE, naloxone, ondansetron (ZOFRAN) IV, ondansetron (ZOFRAN) IV, oxyCODONE, potassium chloride, sodium chloride, traMADol  General appearance: alert and cooperative Neurologic: intact Heart: regular rate and rhythm, S1, S2 normal, no murmur, click, rub or gallop Lungs: diminished breath sounds LLL Abdomen: soft, non-tender; bowel sounds normal; no masses,  no organomegaly Extremities: extremities normal, atraumatic, no cyanosis or edema and Homans sign is negative, no sign of DVT Wound: no air leak   Lab Results: CBC: Recent Labs  01/31/14 1611 02/02/14 0330  WBC 10.8* 16.8*  HGB 11.9* 11.3*  HCT 35.5* 34.1*  PLT 349 398   BMET:  Recent Labs  01/31/14 1611 02/02/14 0330  NA 140 136*  K 4.0 4.3  CL 102 98  CO2 24 24  GLUCOSE 100* 160*  BUN 15 15  CREATININE 0.89 0.78  CALCIUM 9.1 9.0    PT/INR:  Recent Labs  01/31/14 1611  LABPROT 13.6  INR 1.03   Radiology: Dg Chest 2 View  01/31/2014   CLINICAL DATA:  Recurrent pleural effusions. Persistent cough and shortness of breath  EXAM: CHEST  2 VIEW  COMPARISON:  January 10, 2014  FINDINGS: There  is a left pleural effusion which appears at least partially loculated. This effusion is larger compared to recent prior study. There is patchy consolidation in the left base as well. On the right, there is minimal right base atelectasis. There is evidence of underlying emphysema. Elsewhere lungs are clear. Heart size and pulmonary vascularity are normal. No adenopathy. There is degenerative change in the thoracic spine.  IMPRESSION: Effusion on the left is at least partially loculated in larger compared to recent prior study. There is patchy consolidation in the left base. There is underlying emphysema. There is minimal atelectasis in the right base. Right lung otherwise appears clear. No change in  cardiac silhouette.   Electronically Signed   By: Lowella Grip M.D.   On: 01/31/2014 13:14   Dg Chest Port 1 View  02/02/2014   CLINICAL DATA:  Recent pleural effusion drainage and tibias assisted thoracoscopy. Status post pleurodesis  EXAM: PORTABLE CHEST - 1 VIEW  COMPARISON:  February 01, 2014  FINDINGS: Central catheter tip is in the superior vena cava. Left chest tube position is unchanged. No pneumothorax. There is a minimal residual effusion on the left. There is slight bibasilar atelectatic change. No edema or consolidation. Heart size and pulmonary vascularity are within normal limits. No adenopathy.  IMPRESSION: Tube and catheter positions unchanged. No pneumothorax. Minimal left effusion. Mild bibasilar atelectatic change. No frank edema or consolidation.   Electronically Signed   By: Lowella Grip M.D.   On: 02/02/2014 07:21   Dg Chest Port 1 View  02/01/2014   CLINICAL DATA:  Pleural effusion  EXAM: PORTABLE CHEST - 1 VIEW  COMPARISON:  Portable exam 1118 hr compared to 01/31/2014  FINDINGS: New RIGHT jugular central venous catheter tip projecting over SVC.  New LEFT thoracostomy tube.  Enlargement of cardiac silhouette with pulmonary vascular congestion.  Decreased LEFT pleural effusion and basilar atelectasis.  Mild RIGHT basilar atelectasis.  No pneumothorax.  Bones unremarkable.  IMPRESSION: Decrease in LEFT pleural effusion and atelectasis post chest tube placement.  No pneumothorax.   Electronically Signed   By: Lavonia Dana M.D.   On: 02/01/2014 11:46     Assessment/Plan: S/P Procedure(s) (LRB): VIDEO ASSISTED THORACOSCOPY (Left) DRAINAGE OF PLEURAL EFFUSION (Left) VIDEO BRONCHOSCOPY (N/A) PLEURAL BIOPSY (Left) INSERTION PLEURAL DRAINAGE CATHETER (Left) TALC PLEURADESIS (Left) Mobilize d/c tubes/lines dc chest tube drain pleurix daily for now Path pending    Sharne Linders B 02/02/2014 8:23 AM

## 2014-02-02 NOTE — Progress Notes (Signed)
[facility] BRIEF SICU PROGRESS NOTE  1 Day Post-Op  S/P Procedure(s) (LRB): VIDEO ASSISTED THORACOSCOPY (Left) DRAINAGE OF PLEURAL EFFUSION (Left) VIDEO BRONCHOSCOPY (N/A) PLEURAL BIOPSY (Left) INSERTION PLEURAL DRAINAGE CATHETER (Left) TALC PLEURADESIS (Left)   Stable day  Plan: Continue current plan  Jaymen Fetch H 02/02/2014 4:36 PM

## 2014-02-03 ENCOUNTER — Inpatient Hospital Stay (HOSPITAL_COMMUNITY): Payer: BC Managed Care – PPO

## 2014-02-03 LAB — ADENOSINE DEAMINASE, FLUID: Adenosine Deaminase, Fluid: 30.7 U/L — ABNORMAL HIGH (ref <7.6)

## 2014-02-03 LAB — CBC
HCT: 35.6 % — ABNORMAL LOW (ref 39.0–52.0)
Hemoglobin: 11.5 g/dL — ABNORMAL LOW (ref 13.0–17.0)
MCH: 26.9 pg (ref 26.0–34.0)
MCHC: 32.3 g/dL (ref 30.0–36.0)
MCV: 83.2 fL (ref 78.0–100.0)
Platelets: 362 10*3/uL (ref 150–400)
RBC: 4.28 MIL/uL (ref 4.22–5.81)
RDW: 13.6 % (ref 11.5–15.5)
WBC: 15.2 10*3/uL — ABNORMAL HIGH (ref 4.0–10.5)

## 2014-02-03 LAB — COMPREHENSIVE METABOLIC PANEL
ALT: 18 U/L (ref 0–53)
AST: 14 U/L (ref 0–37)
Albumin: 2.9 g/dL — ABNORMAL LOW (ref 3.5–5.2)
Alkaline Phosphatase: 73 U/L (ref 39–117)
Anion gap: 13 (ref 5–15)
BUN: 17 mg/dL (ref 6–23)
CO2: 26 mEq/L (ref 19–32)
Calcium: 8.8 mg/dL (ref 8.4–10.5)
Chloride: 96 mEq/L (ref 96–112)
Creatinine, Ser: 0.87 mg/dL (ref 0.50–1.35)
GFR calc Af Amer: >90 mL/min (ref >90)
GFR calc non Af Amer: >90 mL/min (ref >90)
Glucose, Bld: 101 mg/dL — ABNORMAL HIGH (ref 70–99)
Potassium: 4.2 mEq/L (ref 3.7–5.3)
Sodium: 135 mEq/L — ABNORMAL LOW (ref 137–147)
Total Bilirubin: 0.4 mg/dL (ref 0.3–1.2)
Total Protein: 7.2 g/dL (ref 6.0–8.3)

## 2014-02-03 MED ORDER — KETOROLAC TROMETHAMINE 15 MG/ML IJ SOLN
INTRAMUSCULAR | Status: AC
  Filled 2014-02-03: qty 1

## 2014-02-03 MED ORDER — KETOROLAC TROMETHAMINE 15 MG/ML IJ SOLN
15.0000 mg | Freq: Four times a day (QID) | INTRAMUSCULAR | Status: AC
  Administered 2014-02-03 (×2): 15 mg via INTRAVENOUS
  Filled 2014-02-03: qty 1

## 2014-02-03 MED ORDER — LEVALBUTEROL HCL 0.63 MG/3ML IN NEBU
0.6300 mg | INHALATION_SOLUTION | Freq: Four times a day (QID) | RESPIRATORY_TRACT | Status: DC
  Administered 2014-02-03: 0.63 mg via RESPIRATORY_TRACT
  Filled 2014-02-03: qty 3

## 2014-02-03 MED ORDER — LEVALBUTEROL HCL 0.63 MG/3ML IN NEBU: 0.6300 mg | INHALATION_SOLUTION | Freq: Four times a day (QID) | RESPIRATORY_TRACT | Status: DC | PRN

## 2014-02-03 NOTE — Progress Notes (Signed)
Wasted 13ML fent. PCA, witnessed by Second RN Belva Agee, rinsed down the sink

## 2014-02-03 NOTE — Progress Notes (Signed)
Patient ID: Colin Lee, male   DOB: [birth year], 53 y.o.   MRN: [id number] [facility] DAILY ICU PROGRESS NOTE                   [address]            [place] [address]          [phone]   2 Days Post-Op Procedure(s) (LRB): VIDEO ASSISTED THORACOSCOPY (Left) DRAINAGE OF PLEURAL EFFUSION (Left) VIDEO BRONCHOSCOPY (N/A) PLEURAL BIOPSY (Left) INSERTION PLEURAL DRAINAGE CATHETER (Left) TALC PLEURADESIS (Left)  Total Length of Stay:  LOS: 2 days   Subjective: Sob better  Objective: Vital signs in last 24 hours: Temp:  [97.2 F (36.2 C)-99.8 F (37.7 C)] 98.3 F (36.8 C) (10/29 0743) Pulse Rate:  [77-105] 96 (10/29 0700) Cardiac Rhythm:  [-] Normal sinus rhythm (10/29 0500) Resp:  [10-26] 23 (10/29 0700) BP: (104-161)/(63-90) 112/66 mmHg (10/29 0700) SpO2:  [89 %-100 %] 93 % (10/29 0700) FiO2 (%):  [21 %] 21 % (10/28 1620) Weight:  [272 lb 0.8 oz (123.4 kg)] 272 lb 0.8 oz (123.4 kg) (10/29 0600)  Filed Weights   02/01/14 0739 02/02/14 0600 02/03/14 0600  Weight: 274 lb (124.286 kg) 268 lb 12.8 oz (121.927 kg) 272 lb 0.8 oz (123.4 kg)    Weight change: -1 lb 15.2 oz (-0.886 kg)   Hemodynamic parameters for last 24 hours:    Intake/Output from previous day: 10/28 0701 - 10/29 0700 In: 1200 [P.O.:840; I.V.:310; IV Piggyback:50] Out: 2095 [Urine:2075; Chest Tube:20]  Intake/Output this shift:    Current Meds: Scheduled Meds: . acetaminophen  650 mg Oral 4 times per day   Or  . acetaminophen (TYLENOL) oral liquid 160 mg/5 mL  650 mg Oral 4 times per day  . antiseptic oral rinse  7 mL Mouth Rinse BID  . bisacodyl  10 mg Oral Daily  . enoxaparin (LOVENOX) injection  30 mg Subcutaneous Q24H  . fentaNYL   Intravenous 6 times per day  . losartan  50 mg Oral Daily  . polyethylene glycol  17 g Oral BID  . rOPINIRole  0.25 mg Oral QHS  . senna-docusate  1 tablet Oral QHS   Continuous Infusions: . sodium chloride 10 mL/hr at 02/02/14 1000   PRN  Meds:.albuterol, diphenhydrAMINE, diphenhydrAMINE, famotidine, naloxone, ondansetron (ZOFRAN) IV, oxyCODONE, potassium chloride, sodium chloride, traMADol  General appearance: alert and cooperative Neurologic: intact Heart: regular rate and rhythm, S1, S2 normal, no murmur, click, rub or gallop Lungs: wheezes apex - right Abdomen: soft, non-tender; bowel sounds normal; no masses,  no organomegaly Extremities: extremities normal, atraumatic, no cyanosis or edema and Homans sign is negative, no sign of DVT Wound: intact  Lab Results: CBC: Recent Labs  02/02/14 0330 02/03/14 0500  WBC 16.8* 15.2*  HGB 11.3* 11.5*  HCT 34.1* 35.6*  PLT 398 362   BMET:  Recent Labs  02/02/14 0330 02/03/14 0500  NA 136* 135*  K 4.3 4.2  CL 98 96  CO2 24 26  GLUCOSE 160* 101*  BUN 15 17  CREATININE 0.78 0.87  CALCIUM 9.0 8.8    PT/INR:  Recent Labs  01/31/14 1611  LABPROT 13.6  INR 1.03   Radiology: Dg Chest Port 1 View  02/02/2014   CLINICAL DATA:  Recent pleural effusion drainage and tibias assisted thoracoscopy. Status post pleurodesis  EXAM: PORTABLE CHEST - 1 VIEW  COMPARISON:  February 01, 2014  FINDINGS: Central catheter tip is in the  superior vena cava. Left chest tube position is unchanged. No pneumothorax. There is a minimal residual effusion on the left. There is slight bibasilar atelectatic change. No edema or consolidation. Heart size and pulmonary vascularity are within normal limits. No adenopathy.  IMPRESSION: Tube and catheter positions unchanged. No pneumothorax. Minimal left effusion. Mild bibasilar atelectatic change. No frank edema or consolidation.   Electronically Signed   By: Lowella Grip M.D.   On: 02/02/2014 07:21   Dg Chest Port 1 View  02/01/2014   CLINICAL DATA:  Pleural effusion  EXAM: PORTABLE CHEST - 1 VIEW  COMPARISON:  Portable exam 1118 hr compared to 01/31/2014  FINDINGS: New RIGHT jugular central venous catheter tip projecting over SVC.  New LEFT  thoracostomy tube.  Enlargement of cardiac silhouette with pulmonary vascular congestion.  Decreased LEFT pleural effusion and basilar atelectasis.  Mild RIGHT basilar atelectasis.  No pneumothorax.  Bones unremarkable.  IMPRESSION: Decrease in LEFT pleural effusion and atelectasis post chest tube placement.  No pneumothorax.   Electronically Signed   By: Lavonia Dana M.D.   On: 02/01/2014 11:46     Assessment/Plan: S/P Procedure(s) (LRB): VIDEO ASSISTED THORACOSCOPY (Left) DRAINAGE OF PLEURAL EFFUSION (Left) VIDEO BRONCHOSCOPY (N/A) PLEURAL BIOPSY (Left) INSERTION PLEURAL DRAINAGE CATHETER (Left) TALC PLEURADESIS (Left) plan to telemetry, poss home in am Drain pleurix today bronch dilators for wheezing Path pending   Ericha Whittingham B 02/03/2014 8:05 AM

## 2014-02-03 NOTE — Progress Notes (Signed)
PATIENT DETAILS Name: Colin Lee Age: 53 y.o. Sex: male Date of Birth: Sep 25, 1960 Admit Date: 02/01/2014 Admitting Physician Grace Isaac, MD UXN:[id number], Thomasena Edis, PA-C  Subjective: No major issues overnight  Assessment/Plan: Principal Problem: Recurrent pleural effusion - hepatic hydrothorax  sp VATS and pleurodesis - work up has revealed a possible rheumatologic etiology - pt has appt in Nov 19 (Dr Trudie Reed)- will not pursue further rheum work up here . Follow up path.  RLS  Cont home tx regimen   HTN  Reasonably well controlled today, accounting for pain - follow w/o change   Obesity - Body mass index is 38.57 kg/(m^2).   GERD  Hospitalist will sign off. Patient needs to keep his Rheum appointment  Disposition: Per Primary service  Antibiotics: Cefuroxime 10/26 > 10/28  DVT Prophylaxis: Prophylactic Lovenox   Code Status: Full code   Family Communication Spouse at bedside  Procedures: 10/28 - Bronchoscopy, left video-assisted thoracoscopy, drainage of left pleural effusion, pleural biopsies, talc pleurodesis, placement of chest tube and PleurX catheter  MEDICATIONS: Scheduled Meds: . acetaminophen  650 mg Oral 4 times per day   Or  . acetaminophen (TYLENOL) oral liquid 160 mg/5 mL  650 mg Oral 4 times per day  . antiseptic oral rinse  7 mL Mouth Rinse BID  . bisacodyl  10 mg Oral Daily  . enoxaparin (LOVENOX) injection  30 mg Subcutaneous Q24H  . ketorolac  15 mg Intravenous 4 times per day  . losartan  50 mg Oral Daily  . polyethylene glycol  17 g Oral BID  . rOPINIRole  0.25 mg Oral QHS  . senna-docusate  1 tablet Oral QHS   Continuous Infusions: . sodium chloride Stopped (02/03/14 1000)   PRN Meds:.albuterol, famotidine, levalbuterol, ondansetron (ZOFRAN) IV, oxyCODONE, potassium chloride, traMADol  Antibiotics: Anti-infectives   Start     Dose/Rate Route Frequency Ordered Stop   02/01/14 2000  cefUROXime (ZINACEF)  1.5 g in dextrose 5 % 50 mL IVPB     1.5 g 100 mL/hr over 30 Minutes Intravenous Every 12 hours 02/01/14 1243 02/02/14 0815   01/31/14 1443  cefUROXime (ZINACEF) 1.5 g in dextrose 5 % 50 mL IVPB     1.5 g 100 mL/hr over 30 Minutes Intravenous 60 min pre-op 01/31/14 1443 02/01/14 0908       PHYSICAL EXAM: Vital signs in last 24 hours: Filed Vitals:   02/03/14 1100 02/03/14 1200 02/03/14 1212 02/03/14 1300  BP: 143/82 136/73  141/77  Pulse:      Temp:   99.6 F (37.6 C)   TempSrc:   Oral   Resp: 18 13  18   Height:      Weight:      SpO2:  96%      Weight change: -0.886 kg (-1 lb 15.2 oz) Filed Weights   02/01/14 0739 02/02/14 0600 02/03/14 0600  Weight: 124.286 kg (274 lb) 121.927 kg (268 lb 12.8 oz) 123.4 kg (272 lb 0.8 oz)   Body mass index is 39.03 kg/(m^2).   Gen Exam: Awake and alert with clear speech.  Neck: Supple, No JVD.   Chest: B/L Clear.   CVS: S1 S2 Regular, no murmurs.  Abdomen: soft, BS +, non tender, non distended.  Extremities: no edema, lower extremities warm to touch. Neurologic: Non Focal.   Skin: No Rash.   Wounds: N/A.   Intake/Output from previous day:  Intake/Output Summary (Last 24 hours) at 02/03/14 1342 Last  data filed at 02/03/14 1000  Gross per 24 hour  Intake 1243.3 ml  Output   1725 ml  Net -481.7 ml     LAB RESULTS: CBC  Recent Labs Lab 01/31/14 1611 02/02/14 0330 02/03/14 0500  WBC 10.8* 16.8* 15.2*  HGB 11.9* 11.3* 11.5*  HCT 35.5* 34.1* 35.6*  PLT 349 398 362  MCV 80.0 80.6 83.2  MCH 26.8 26.7 26.9  MCHC 33.5 33.1 32.3  RDW 13.1 13.2 13.6    Chemistries   Recent Labs Lab 01/31/14 1611 02/02/14 0330 02/03/14 0500  NA 140 136* 135*  K 4.0 4.3 4.2  CL 102 98 96  CO2 24 24 26   GLUCOSE 100* 160* 101*  BUN 15 15 17   CREATININE 0.89 0.78 0.87  CALCIUM 9.1 9.0 8.8    CBG: No results found for this basename: GLUCAP,  in the last 168 hours  GFR Estimated Creatinine Clearance: 132.4 ml/min (by C-G  formula based on Cr of 0.87).  Coagulation profile  Recent Labs Lab 01/31/14 1611  INR 1.03    Cardiac Enzymes No results found for this basename: CK, CKMB, TROPONINI, MYOGLOBIN,  in the last 168 hours  No components found with this basename: POCBNP,  No results found for this basename: DDIMER,  in the last 72 hours No results found for this basename: HGBA1C,  in the last 72 hours No results found for this basename: CHOL, HDL, LDLCALC, TRIG, CHOLHDL, LDLDIRECT,  in the last 72 hours No results found for this basename: TSH, T4TOTAL, FREET3, T3FREE, THYROIDAB,  in the last 72 hours No results found for this basename: VITAMINB12, FOLATE, FERRITIN, TIBC, IRON, RETICCTPCT,  in the last 72 hours No results found for this basename: LIPASE, AMYLASE,  in the last 72 hours  Urine Studies No results found for this basename: UACOL, UAPR, USPG, UPH, UTP, UGL, UKET, UBIL, UHGB, UNIT, UROB, ULEU, UEPI, UWBC, URBC, UBAC, CAST, CRYS, UCOM, BILUA,  in the last 72 hours  MICROBIOLOGY: Recent Results (from the past 240 hour(s))  SURGICAL PCR SCREEN     Status: None   Collection Time    01/31/14  4:10 PM      Result Value Ref Range Status   MRSA, PCR NEGATIVE  NEGATIVE Final   Staphylococcus aureus NEGATIVE  NEGATIVE Final   Comment:            The Xpert SA Assay (FDA     approved for NASAL specimens     in patients over 6 years of age),     is one component of     a comprehensive surveillance     program.  Test performance has     been validated by [employer] for patients greater     than or equal to 69 year old.     It is not intended     to diagnose infection nor to     guide or monitor treatment.  AFB CULTURE WITH SMEAR     Status: None   Collection Time    02/01/14 10:15 AM      Result Value Ref Range Status   Specimen Description PLEURAL LEFT   Final   Special Requests PT ON ZINACEF   Final   Acid Fast Smear     Final   Value: NO ACID FAST BACILLI SEEN     Performed at  [facility]   Culture     Final   Value: CULTURE WILL  BE EXAMINED FOR 6 WEEKS BEFORE ISSUING A FINAL REPORT     Performed at [facility]   Report Status PENDING   Incomplete  BODY FLUID CULTURE     Status: None   Collection Time    02/01/14 10:15 AM      Result Value Ref Range Status   Specimen Description PLEURAL LEFT   Final   Special Requests PT ON ZINACEF   Final   Gram Stain     Final   Value: RARE WBC PRESENT, PREDOMINANTLY MONONUCLEAR     NO ORGANISMS SEEN     Performed at [facility]   Culture     Final   Value: NO GROWTH 2 DAYS     Performed at [facility]   Report Status PENDING   Incomplete  FUNGUS CULTURE W SMEAR     Status: None   Collection Time    02/01/14 10:15 AM      Result Value Ref Range Status   Specimen Description PLEURAL LEFT   Final   Special Requests PT ON ZINACEF   Final   Fungal Smear     Final   Value: NO YEAST OR FUNGAL ELEMENTS SEEN     Performed at [facility]   Culture     Final   Value: CULTURE IN PROGRESS FOR FOUR WEEKS     Performed at [facility]   Report Status PENDING   Incomplete    RADIOLOGY STUDIES/RESULTS: Dg Chest 2 View  02/03/2014   CLINICAL DATA:  Status post thoracic surgery.  EXAM: CHEST  2 VIEW  COMPARISON:  February 02, 2014.  FINDINGS: No pneumothorax is noted. Stable mild bilateral pleural effusions are noted with left greater than right. Right internal jugular catheter line is unchanged in position with distal tip overlying expected position of the SVC. Stable mild bibasilar subsegmental atelectasis is noted. Bony thorax is intact.  IMPRESSION: Stable mild bilateral basilar subsegmental atelectasis and pleural effusions. No significant change compared to prior exam.   Electronically Signed   By: Sabino Dick M.D.   On: 02/03/2014 08:08   Dg Chest 2 View  01/31/2014   CLINICAL DATA:  Recurrent pleural effusions. Persistent cough and shortness of breath  EXAM: CHEST  2  VIEW  COMPARISON:  January 10, 2014  FINDINGS: There is a left pleural effusion which appears at least partially loculated. This effusion is larger compared to recent prior study. There is patchy consolidation in the left base as well. On the right, there is minimal right base atelectasis. There is evidence of underlying emphysema. Elsewhere lungs are clear. Heart size and pulmonary vascularity are normal. No adenopathy. There is degenerative change in the thoracic spine.  IMPRESSION: Effusion on the left is at least partially loculated in larger compared to recent prior study. There is patchy consolidation in the left base. There is underlying emphysema. There is minimal atelectasis in the right base. Right lung otherwise appears clear. No change in cardiac silhouette.   Electronically Signed   By: Lowella Grip M.D.   On: 01/31/2014 13:14   Dg Chest 2 View  01/10/2014   CLINICAL DATA:  Followup pleural effusion. Patient had thoracentesis December 28, 2013. Short of breath.  EXAM: CHEST  2 VIEW  COMPARISON:  December 29, 2013  FINDINGS: The heart size and mediastinal contours are within normal limits. There are small bilateral pleural effusions. There is no focal pneumonia or pulmonary edema. The visualized skeletal structures  are stable.  IMPRESSION: Small bilateral pleural effusions unchanged compared to December 29, 2013.   Electronically Signed   By: Abelardo Diesel M.D.   On: 01/10/2014 12:55   Dg Chest Port 1 View  02/02/2014   CLINICAL DATA:  Recent pleural effusion drainage and tibias assisted thoracoscopy. Status post pleurodesis  EXAM: PORTABLE CHEST - 1 VIEW  COMPARISON:  February 01, 2014  FINDINGS: Central catheter tip is in the superior vena cava. Left chest tube position is unchanged. No pneumothorax. There is a minimal residual effusion on the left. There is slight bibasilar atelectatic change. No edema or consolidation. Heart size and pulmonary vascularity are within normal limits. No  adenopathy.  IMPRESSION: Tube and catheter positions unchanged. No pneumothorax. Minimal left effusion. Mild bibasilar atelectatic change. No frank edema or consolidation.   Electronically Signed   By: Lowella Grip M.D.   On: 02/02/2014 07:21   Dg Chest Port 1 View  02/01/2014   CLINICAL DATA:  Pleural effusion  EXAM: PORTABLE CHEST - 1 VIEW  COMPARISON:  Portable exam 1118 hr compared to 01/31/2014  FINDINGS: New RIGHT jugular central venous catheter tip projecting over SVC.  New LEFT thoracostomy tube.  Enlargement of cardiac silhouette with pulmonary vascular congestion.  Decreased LEFT pleural effusion and basilar atelectasis.  Mild RIGHT basilar atelectasis.  No pneumothorax.  Bones unremarkable.  IMPRESSION: Decrease in LEFT pleural effusion and atelectasis post chest tube placement.  No pneumothorax.   Electronically Signed   By: Lavonia Dana M.D.   On: 02/01/2014 11:46    Oren Binet, MD  [facility] Pager:[phone]  If 7PM-7AM, please contact night-coverage [email or web address] Password [email or web address] 02/03/2014, 1:42 PM   LOS: 2 days

## 2014-02-03 NOTE — Progress Notes (Signed)
Attempted to call report to 2W06 RN, she was busy and I was informed that she would call the unit back and ask for report. Passed onto Agricultural consultant. Richardean Sale, RN

## 2014-02-03 NOTE — Care Management Note (Addendum)
    Page 1 of 2   02/04/2014     4:15:31 PM CARE MANAGEMENT NOTE 02/04/2014  Patient:  Colin Lee, Colin Lee   Account Number:  [id number]  Date Initiated:  02/03/2014  Documentation initiated by:  MAYO,HENRIETTA  Subjective/Objective Assessment:   dx recurrent pleural effusion; lives with spouse    PCP Karna Dupes     Action/Plan:   Anticipated DC Date:  02/04/2014   Anticipated DC Plan:  [place]  CM consult      [facility] Choice  HOME HEALTH   Choice offered to / List presented to:  C-3 Spouse   DME arranged  [place]      DME agency  OTHER - SEE NOTE     HH arranged  HH-1 RN      [place].   Status of service:  Completed, signed off Medicare Important Message given?   (If response is "NO", the following Medicare IM given date fields will be blank) Date Medicare IM given:   Medicare IM given by:   Date Additional Medicare IM given:   Additional Medicare IM given by:    Discharge Disposition:  [place]  Per UR Regulation:  Reviewed for med. necessity/level of care/duration of stay  If discussed at [place] of Stay Meetings, dates discussed:    Comments:  Contact:  Derris Millan, wife  [phone]  02/04/14 Ellan Lambert, RN, BSN [phone] Pt for dc home today.  Pt has box of pleurx drains to take home.  [facility] aware of dc date.  02/04/14 [place] MSN BSN CCM Per Denman George with [facility] [phone]), home health care has been precertified.  02/03/14 [place] MSN BSN CCM Pt will d/c home with pleurex catheter, wife has requested home health RN to continue teaching and for reinforcement as she learns to change catheters.  Provided her with list of [facility] health agencies, referral made per choice.  Order and documentation faxed to [facility].

## 2014-02-03 NOTE — Plan of Care (Signed)
Problem: Phase II Progression Outcomes Goal: Pain controlled Outcome: Not Progressing MD paged and made aware of pain control issues. New orders placed

## 2014-02-04 LAB — QUANTIFERON TB GOLD ASSAY (BLOOD)
Mitogen value: 0.03 IU/mL
Quantiferon Nil Value: 0.01 IU/mL
TB Ag value: 0.01 IU/mL
TB Antigen Minus Nil Value: 0 IU/mL

## 2014-02-04 MED ORDER — OXYCODONE HCL 5 MG PO CAPS
5.0000 mg | ORAL_CAPSULE | ORAL | Status: DC | PRN
Start: 2014-02-04 — End: 2014-02-23

## 2014-02-04 NOTE — Progress Notes (Addendum)
      [address]       [place] [address]             [phone]       3 Days Post-Op Procedure(s) (LRB): VIDEO ASSISTED THORACOSCOPY (Left) DRAINAGE OF PLEURAL EFFUSION (Left) VIDEO BRONCHOSCOPY (N/A) PLEURAL BIOPSY (Left) INSERTION PLEURAL DRAINAGE CATHETER (Left) TALC PLEURADESIS (Left)  Subjective: Patient about to eat breakfast. "A little sore" at previous chest tube site.   Objective: Vital signs in last 24 hours: Temp:  [98 F (36.7 C)-100.4 F (38 C)] 100.4 F (38 C) (10/30 0437) Pulse Rate:  [89-108] 108 (10/30 0437) Cardiac Rhythm:  [-] Normal sinus rhythm (10/29 2100) Resp:  [13-21] 18 (10/30 0437) BP: (108-143)/(58-82) 134/74 mmHg (10/30 0437) SpO2:  [91 %-96 %] 91 % (10/30 0437) FiO2 (%):  [0 %] 0 % (10/29 0800)     Intake/Output from previous day: 10/29 0701 - 10/30 0700 In: 703.3 [P.O.:600; I.V.:103.3] Out: 50 [Chest Tube:50]   Physical Exam:  Cardiovascular: RRR, no murmurs, gallops, or rubs. Pulmonary: Clear to auscultation on right and diminished left base Abdomen: Soft, non tender, bowel sounds present. Extremities: Mild bilateral lower extremity edema. Wounds: Dressing is clean and dry.    Lab Results: CBC: Recent Labs  02/02/14 0330 02/03/14 0500  WBC 16.8* 15.2*  HGB 11.3* 11.5*  HCT 34.1* 35.6*  PLT 398 362   BMET:  Recent Labs  02/02/14 0330 02/03/14 0500  NA 136* 135*  K 4.3 4.2  CL 98 96  CO2 24 26  GLUCOSE 160* 101*  BUN 15 17  CREATININE 0.78 0.87  CALCIUM 9.0 8.8    PT/INR: No results found for this basename: LABPROT, INR,  in the last 72 hours ABG:  INR: Will add last result for INR, ABG once components are confirmed Will add last 4 CBG results once components are confirmed  Assessment/Plan:  1. CV - ST 2.  Pulmonary - Pathology results:1. Pleura, biopsy, Left pleural #4 - BENIGN PLEURAL FIBROSIS WITH ASSOCIATED CHRONIC INFLAMMATION, SEE COMMENT. 2. Pleura, biopsy, Left pleural  #1 - BENIGN PLEURAL FIBROSIS WITH REACTIVE MESOTHELIAL CELLS, SEE COMMENT. 3. Pleura, biopsy, Left pleural #2 - REACTIVE PLEURAL MESOTHELIAL CELLS WITH MINIMAL CHRONIC INFLAMMATION, SEE COMMENT. 4. Pleura, biopsy, Left pleural #3 - FIBROTIC PLEURAL PLAQUE WITH CHRONIC INFLAMMATION, SEE COMMENT. 3.Fever to 100.4 and leukocytosis (although decreased from 16.8 to 15.2) may secondary to TALC 4. Will discuss drainage of Pleur X and discharge disposition with surgeon. Likely continue drainage of Pleur X Mon, Wed, Fri. Will arrange home health.  ZIMMERMAN,DONIELLE MPA-C 02/04/2014,7:31 AM  Path noted and discussed with the patient Plan d/c today Drain pleurix mwf, if minimal remove next week I have seen and examined Colin Lee and agree with the above assessment  and plan.  Grace Isaac MD [facility] [phone] Office [phone] 02/04/2014 10:40 AM

## 2014-02-04 NOTE — Progress Notes (Signed)
Pt discharged home with wife Discharge instructions given & reviewed Eduction discussed  IV dc'd  Tele dc'd  Pt discharged via volunteer service in wheelchair, all pt belongs at side.  Kathleen Argue S 2:10 PM

## 2014-02-04 NOTE — Discharge Summary (Addendum)
Physician Discharge Summary       [address]       [place] [address]             [phone]    Patient ID: Colin Lee MRN: [id number] DOB/AGE: 51-Jul-1962 51 y.o.  Admit date: 02/01/2014 Discharge date: 02/04/2014  Admission Diagnoses: 1. Recurrent left pleural effusion 2. History of hypertension 3. History of GERD 4. History of tobacco abuse  Discharge Diagnoses:  1. Recurrent left pleural effusion 2. History of hypertension 3. History of GERD 4. History of tobacco abuse   Consults: Medicine  Procedure (s):  Bronchoscopy, left video-assisted thoracoscopy,  drainage of left pleural effusion, pleural biopsies, talc pleurodesis, placement of chest tube and PleurX catheter by Dr. [name] on 02/01/2014.  Pathology: 1. Pleura, biopsy, Left pleural #4 - BENIGN PLEURAL FIBROSIS WITH ASSOCIATED CHRONIC INFLAMMATION, SEE COMMENT. 2. Pleura, biopsy, Left pleural #1 - BENIGN PLEURAL FIBROSIS WITH REACTIVE MESOTHELIAL CELLS, SEE COMMENT. 3. Pleura, biopsy, Left pleural #2 - REACTIVE PLEURAL MESOTHELIAL CELLS WITH MINIMAL CHRONIC INFLAMMATION, SEE COMMENT. 4. Pleura, biopsy, Left pleural #3 - FIBROTIC PLEURAL PLAQUE WITH CHRONIC INFLAMMATION, SEE COMMENT  History of Presenting Illness: This is a 53 year old Caucasian male who presented for a follow up of a recurrent left pleural effusion. He has been admitted to the hospital twice in the last 3 months with shortness of breath and fever. He has been treated for community acquired pneumonia and has undergone 3 thoracenteses for a left parapneumonic effusion. During his most recent hospital stay, TCTS was consulted for possible VATS/drainage of empyema. At the time, the fluid had been drained via thoracentesis and did not appear to be infected. Fluid cytology from the initial episode showed some reactive mesothelial cells, but more recent cytologies showed inflammation, and all cultures have been negative.  Echocardiogram showed normal EF with no other abnormalities. ANA and c-ANA were positive and titers were elevated. The patient is scheduled for rheumatology evaluation on 11/19.   He was seen in the office in follow up 2 weeks prior to this admission, and at that time was essentially asymptomatic. However, over the past week, he has developed increasing dyspnea, orthopnea and dyspnea on exertion, with occasional chest discomfort. He also reports occasional nonproductive cough, as well as subjective fevers, daily headaches, arthralgias and myalgias, and generalized malaise. Additionally, the patient reports that he bruises easily. The patient returns to the office today for further evaluation. Dr. [name] discussed the need for a bronchoscopy, left VATS, drain left pleural effusion, Talc, and placement of Pleur x catheter. Potential risks, complications, and benefits were discussed with the patient and he agreed to proceed with surgery. He underwent the aforementioned surgery on 02/01/2014.  Brief Hospital Course:  He has remained hemodynamically stable. Chest tube that was placed during surgery was removed on 10/28. Pleur X catheter was drained 10/28. He will  require drainage Monday, Wednesday, and Friday. Home health will be arranged to assist with this. He is ambulating on room air. He is tolerating a diet. He is felt surgically stable for discharge today.   Latest Vital Signs: Blood pressure 112/62, pulse 108, temperature 100.4 F (38 C), temperature source Oral, resp. rate 18, height 5\' 10"  (1.778 m), weight 272 lb 0.8 oz (123.4 kg), SpO2 91.00%.  Physical Exam: Cardiovascular: RRR, no murmurs, gallops, or rubs.  Pulmonary: Clear to auscultation on right and diminished left base  Abdomen: Soft, non tender, bowel sounds present.  Extremities: Mild bilateral lower  extremity edema.  Wounds: Dressing is clean and dry.    Discharge Condition:Stable  Recent laboratory studies:  Lab Results    Component Value Date   WBC 15.2* 02/03/2014   HGB 11.5* 02/03/2014   HCT 35.6* 02/03/2014   MCV 83.2 02/03/2014   PLT 362 02/03/2014   Lab Results  Component Value Date   NA 135* 02/03/2014   K 4.2 02/03/2014   CL 96 02/03/2014   CO2 26 02/03/2014   CREATININE 0.87 02/03/2014   GLUCOSE 101* 02/03/2014      Diagnostic Studies: Dg Chest 2 View  02/03/2014   CLINICAL DATA:  Status post thoracic surgery.  EXAM: CHEST  2 VIEW  COMPARISON:  February 02, 2014.  FINDINGS: No pneumothorax is noted. Stable mild bilateral pleural effusions are noted with left greater than right. Right internal jugular catheter line is unchanged in position with distal tip overlying expected position of the SVC. Stable mild bibasilar subsegmental atelectasis is noted. Bony thorax is intact.  IMPRESSION: Stable mild bilateral basilar subsegmental atelectasis and pleural effusions. No significant change compared to prior exam.   Electronically Signed   By: Sabino Dick M.D.   On: 02/03/2014 08:08    Discharge Medications:   Medication List         acetaminophen 500 MG tablet  Commonly known as:  TYLENOL  Take 500 mg by mouth every 4 (four) hours as needed.     albuterol 108 (90 BASE) MCG/ACT inhaler  Commonly known as:  PROVENTIL HFA;VENTOLIN HFA  Inhale 2 puffs into the lungs every 6 (six) hours as needed for wheezing or shortness of breath.     ibuprofen 800 MG tablet  Commonly known as:  ADVIL,MOTRIN  Take 800 mg by mouth every 8 (eight) hours as needed.     losartan 50 MG tablet  Commonly known as:  COZAAR  Take 50 mg by mouth daily.     omeprazole 20 MG capsule  Commonly known as:  PRILOSEC  Take 20 mg by mouth daily.     oxycodone 5 MG capsule  Commonly known as:  OXY-IR  Take 1 capsule (5 mg total) by mouth every 4 (four) hours as needed.     polyethylene glycol packet  Commonly known as:  MIRALAX / GLYCOLAX  Take 17 g by mouth daily.     rOPINIRole 0.25 MG tablet  Commonly known  as:  REQUIP  Take 1 tablet (0.25 mg total) by mouth at bedtime.        Follow Up Appointments: Follow-up Information   Follow up with Grace Isaac, MD On 02/14/2014. (PA/LAT CXR to be taken (at [facility] which is in the same building as Dr. [name]' s office) on 02/14/2014 at 2:00 pm; Appointment with Dr. [name] is at 3:00 pm)    Specialty:  Cardiothoracic Surgery   Contact information:   [address] [place] [address] [phone]       Follow up with [place]. (Please drain Pleur X catheter every Monday, Wednesday, and Friday. Please record output.)       Signed: Kaci Dillie MPA-C 02/04/2014, 10:48 AM

## 2014-02-05 LAB — BODY FLUID CULTURE: Culture: NO GROWTH

## 2014-02-09 ENCOUNTER — Other Ambulatory Visit: Payer: Self-pay | Admitting: Cardiothoracic Surgery

## 2014-02-09 DIAGNOSIS — J90 Pleural effusion, not elsewhere classified: Secondary | ICD-10-CM

## 2014-02-09 LAB — AFB CULTURE WITH SMEAR (NOT AT ARMC)
Acid Fast Smear: NONE SEEN
Special Requests: NORMAL

## 2014-02-10 ENCOUNTER — Encounter: Payer: Self-pay | Admitting: Cardiothoracic Surgery

## 2014-02-10 ENCOUNTER — Ambulatory Visit (INDEPENDENT_AMBULATORY_CARE_PROVIDER_SITE_OTHER): Payer: Self-pay | Admitting: Cardiothoracic Surgery

## 2014-02-10 ENCOUNTER — Ambulatory Visit
Admission: RE | Admit: 2014-02-10 | Discharge: 2014-02-10 | Disposition: A | Discharge status: Home / Self Care | Payer: BC Managed Care – PPO | Source: Ambulatory Visit | Attending: Cardiothoracic Surgery | Admitting: Cardiothoracic Surgery

## 2014-02-10 VITALS — BP 139/90 | HR 94 | Ht 70.0 in | Wt 271.0 lb

## 2014-02-10 DIAGNOSIS — J90 Pleural effusion, not elsewhere classified: Secondary | ICD-10-CM

## 2014-02-10 NOTE — Progress Notes (Signed)
[address]       [place] [address]             [phone]      Colin Lee [facility] Medical Record #[id number] Date of Birth: Apr 27, 1960  Referring: Alonza Bogus, MD Primary Care: [facility], [place]  Chief Complaint:   POST OP FOLLOW UP 02/01/2014 OPERATIVE REPORT PREOPERATIVE DIAGNOSIS: Recurrent left pleural effusion. POSTOPERATIVE DIAGNOSIS: Recurrent left pleural effusion. Final pathology pending. PROCEDURE PERFORMED: Bronchoscopy, left video-assisted thoracoscopy, drainage of left pleural effusion, pleural biopsies, talc pleurodesis, placement of chest tube and PleurX catheter. SURGEON: Lanelle Bal, MD  History of Present Illness:     Patient doing well following drainage of left pleural effusion intact pleurodesis and placement of Pleurx cath. He's draining between 50 mL and 75 ml every other day. He shortness of breath has improved. He still waiting for evaluation by rheumatology. Pathology on his pleural fluid has been negative for malignancy.     Past Medical History  Diagnosis Date  . GERD (gastroesophageal reflux disease)   . Hypertension   . Shortness of breath     Starting May 2015  . Headache     stress and tension  . Neuromuscular disorder     restless legs and leg cramps  . Arthritis      History  Smoking status  . Former Smoker -- 1.00 packs/day for 30 years  . Types: Cigarettes  . Quit date: 04/08/2000  Smokeless tobacco  . Never Used    History  Alcohol Use  . Yes    Comment: rare     No Known Allergies  Current Outpatient Prescriptions  Medication Sig Dispense Refill  . losartan (COZAAR) 50 MG tablet Take 50 mg by mouth daily.    Marland Kitchen omeprazole (PRILOSEC) 20 MG capsule Take 20 mg by mouth daily.    . polyethylene glycol (MIRALAX / GLYCOLAX) packet Take 17 g by mouth daily. 30 each 0  . rOPINIRole (REQUIP) 0.25 MG tablet Take 1 tablet (0.25 mg total) by mouth at bedtime. 30  tablet 0  . acetaminophen (TYLENOL) 500 MG tablet Take 500 mg by mouth every 4 (four) hours as needed.    Marland Kitchen albuterol (PROVENTIL HFA;VENTOLIN HFA) 108 (90 BASE) MCG/ACT inhaler Inhale 2 puffs into the lungs every 6 (six) hours as needed for wheezing or shortness of breath.    Marland Kitchen ibuprofen (ADVIL,MOTRIN) 800 MG tablet Take 800 mg by mouth every 8 (eight) hours as needed.    Marland Kitchen oxycodone (OXY-IR) 5 MG capsule Take 1 capsule (5 mg total) by mouth every 4 (four) hours as needed. 30 capsule 0   No current facility-administered medications for this visit.       Physical Exam: BP 139/90 mmHg  Pulse 94  Ht 5\' 10"  (1.778 m)  Wt 271 lb (122.925 kg)  BMI 38.88 kg/m2  SpO2 96%  General appearance: alert and cooperative Neurologic: intact Heart: regular rate and rhythm, S1, S2 normal, no murmur, click, rub or gallop Lungs: diminished breath sounds bibasilar Abdomen: soft, non-tender; bowel sounds normal; no masses,  no organomegaly Extremities: extremities normal, atraumatic, no cyanosis or edema and Homans sign is negative, no sign of DVT Wound: the left chest tube sutures have slight erythema around the sutures were removed patient's left chest incisions well-healed   Diagnostic Studies & Laboratory data:     Recent Radiology Findings:   Dg Chest 2 View  02/10/2014   CLINICAL DATA:  VATS 2 weeks ago, history of pleural effusion and shortness of breath, followup, former smoking history  EXAM: CHEST  2 VIEW  COMPARISON:  Chest x-ray of 02/03/2014  FINDINGS: Blunting of the costophrenic angles remains left greater than right most consistent were pleural thickening and volume loss. No focal infiltrate is seen. An opacity posteriorly on the lateral view probably on the left may represent loculation of fluid. Cardiomegaly is stable. No acute bony abnormality is seen.  IMPRESSION: Little change in pleural thickening and/or loculated fluid at the lung bases with basilar volume loss. Question small  collection of loculated fluid posteriorly at the left lung base.   Electronically Signed   By: Ivar Drape M.D.   On: 02/10/2014 15:57      Recent Lab Findings: Lab Results  Component Value Date   WBC 15.2* 02/03/2014   HGB 11.5* 02/03/2014   HCT 35.6* 02/03/2014   PLT 362 02/03/2014   GLUCOSE 101* 02/03/2014   CHOL 145 12/29/2013   TRIG 129 12/29/2013   HDL 25* 12/29/2013   LDLCALC 94 12/29/2013   ALT 18 02/03/2014   AST 14 02/03/2014   NA 135* 02/03/2014   K 4.2 02/03/2014   CL 96 02/03/2014   CREATININE 0.87 02/03/2014   BUN 17 02/03/2014   CO2 26 02/03/2014   TSH 1.410 12/27/2013   INR 1.03 01/31/2014   HGBA1C 6.1* 10/06/2013      Assessment / Plan:      Patient with recurrent left pleural effusion now with drainage and talc pleurodesis of the recurrent left pleural effusion, pathology is negative for malignancy Plan to drain the Pleurx 2 more occasions , then we will remove the Pleurx catheter drainage remains low Patient has a rheumatology appointment on Monday  Grace Isaac MD      [address] [place] [address] Office [phone]   Beeper [phone]  02/10/2014 5:02 PM

## 2014-02-14 ENCOUNTER — Ambulatory Visit: Payer: BC Managed Care – PPO | Admitting: Cardiothoracic Surgery

## 2014-02-15 ENCOUNTER — Other Ambulatory Visit: Payer: Self-pay | Admitting: *Deleted

## 2014-02-16 ENCOUNTER — Ambulatory Visit (HOSPITAL_COMMUNITY): Payer: BC Managed Care – PPO

## 2014-02-16 ENCOUNTER — Encounter (HOSPITAL_COMMUNITY)
Admission: RE | Disposition: A | Discharge status: Home / Self Care | Payer: Self-pay | Source: Ambulatory Visit | Attending: Cardiothoracic Surgery

## 2014-02-16 ENCOUNTER — Encounter (HOSPITAL_COMMUNITY): Payer: Self-pay | Admitting: Certified Registered"

## 2014-02-16 ENCOUNTER — Ambulatory Visit (HOSPITAL_COMMUNITY)
Admission: RE | Admit: 2014-02-16 | Discharge: 2014-02-16 | Disposition: A | Discharge status: Home / Self Care | Payer: BC Managed Care – PPO | Source: Ambulatory Visit | Attending: Cardiothoracic Surgery | Admitting: Cardiothoracic Surgery

## 2014-02-16 DIAGNOSIS — Z01811 Encounter for preprocedural respiratory examination: Secondary | ICD-10-CM

## 2014-02-16 DIAGNOSIS — J90 Pleural effusion, not elsewhere classified: Secondary | ICD-10-CM | POA: Diagnosis not present

## 2014-02-16 DIAGNOSIS — Z4803 Encounter for change or removal of drains: Secondary | ICD-10-CM | POA: Diagnosis present

## 2014-02-16 DIAGNOSIS — J939 Pneumothorax, unspecified: Secondary | ICD-10-CM

## 2014-02-16 DIAGNOSIS — Z736 Limitation of activities due to disability: Secondary | ICD-10-CM

## 2014-02-16 HISTORY — PX: REMOVAL OF PLEURAL DRAINAGE CATHETER: SHX5080

## 2014-02-16 SURGERY — REMOVAL, CLOSED DRAINAGE CATHETER SYSTEM, PLEURAL
Anesthesia: Monitor Anesthesia Care | Laterality: Left

## 2014-02-16 MED ORDER — LIDOCAINE HCL 2 % IJ SOLN
INTRAMUSCULAR | Status: AC
  Filled 2014-02-16: qty 20

## 2014-02-16 NOTE — Progress Notes (Addendum)
Minor suture Tray used, and xylocaine 2% administered by Jadene Pierini pre procedure. Pt tolerated procedure well. Denies complaints. D/C home to self care. D/C instructions to patient per wayne gold, PA-C.

## 2014-02-16 NOTE — Progress Notes (Signed)
      [address]       ,[place] [address]             [phone]     BRIEF PROCEDURE NOTE   After draining left pleurx catheter(50cc) the catheter was removed using standard technique Prep: Betadine Anest: 8cc of 2%lidocaine Catheter removed intact Patient tolerated procedure well #1 3-0 nylon suture placed    Onda Kattner E, PA-C

## 2014-02-17 ENCOUNTER — Encounter (HOSPITAL_COMMUNITY): Payer: Self-pay | Admitting: Cardiothoracic Surgery

## 2014-02-21 ENCOUNTER — Other Ambulatory Visit: Payer: Self-pay | Admitting: Physician Assistant

## 2014-02-22 DIAGNOSIS — J90 Pleural effusion, not elsewhere classified: Secondary | ICD-10-CM

## 2014-02-23 ENCOUNTER — Other Ambulatory Visit: Payer: Self-pay | Admitting: *Deleted

## 2014-02-23 ENCOUNTER — Ambulatory Visit (INDEPENDENT_AMBULATORY_CARE_PROVIDER_SITE_OTHER): Payer: Self-pay

## 2014-02-23 DIAGNOSIS — G8918 Other acute postprocedural pain: Secondary | ICD-10-CM

## 2014-02-23 DIAGNOSIS — J90 Pleural effusion, not elsewhere classified: Secondary | ICD-10-CM

## 2014-02-23 DIAGNOSIS — Z4802 Encounter for removal of sutures: Secondary | ICD-10-CM

## 2014-02-23 MED ORDER — OXYCODONE HCL 5 MG PO CAPS: 5.0000 mg | ORAL_CAPSULE | Freq: Four times a day (QID) | ORAL | Status: DC | PRN

## 2014-02-23 NOTE — Progress Notes (Signed)
Remove 1 suture from Pleurx site. No signs of infection and pt tolerated well. RX refill for pain med given

## 2014-02-24 ENCOUNTER — Other Ambulatory Visit: Payer: Self-pay | Admitting: Cardiothoracic Surgery

## 2014-02-24 DIAGNOSIS — J90 Pleural effusion, not elsewhere classified: Secondary | ICD-10-CM

## 2014-02-28 ENCOUNTER — Ambulatory Visit (INDEPENDENT_AMBULATORY_CARE_PROVIDER_SITE_OTHER): Payer: Self-pay | Admitting: Cardiothoracic Surgery

## 2014-02-28 ENCOUNTER — Ambulatory Visit
Admission: RE | Admit: 2014-02-28 | Discharge: 2014-02-28 | Disposition: A | Discharge status: Home / Self Care | Payer: BC Managed Care – PPO | Source: Ambulatory Visit | Attending: Cardiothoracic Surgery | Admitting: Cardiothoracic Surgery

## 2014-02-28 ENCOUNTER — Encounter: Payer: Self-pay | Admitting: Cardiothoracic Surgery

## 2014-02-28 VITALS — BP 152/93 | HR 78 | Resp 16 | Ht 70.0 in | Wt 271.0 lb

## 2014-02-28 DIAGNOSIS — J90 Pleural effusion, not elsewhere classified: Secondary | ICD-10-CM

## 2014-02-28 DIAGNOSIS — J948 Other specified pleural conditions: Secondary | ICD-10-CM

## 2014-02-28 LAB — FUNGUS CULTURE W SMEAR: Fungal Smear: NONE SEEN

## 2014-02-28 NOTE — Progress Notes (Signed)
[address]       [place] [address]             [phone]      Hamad R Daws [place] Medical Record #[id number] Date of Birth: 1961-04-08  Referring: Alonza Bogus, MD Primary Care: [facility], [place]  Chief Complaint:   POST OP FOLLOW UP 02/01/2014 OPERATIVE REPORT PREOPERATIVE DIAGNOSIS: Recurrent left pleural effusion. POSTOPERATIVE DIAGNOSIS: Recurrent left pleural effusion. Final pathology pending. PROCEDURE PERFORMED: Bronchoscopy, left video-assisted thoracoscopy, drainage of left pleural effusion, pleural biopsies, talc pleurodesis, placement of chest tube and PleurX catheter. SURGEON: Lanelle Bal, MD  History of Present Illness:     Patient doing well following drainage of left pleural effusion intact pleurodesis and placement of Pleurx cath. He's draining between 50 mL and 75 ml every other day. He shortness of breath has improved. He was  Evaluated  by rheumatology. Pathology on his pleural fluid and pleural biopsy has been negative for malignancy.     Past Medical History  Diagnosis Date  . GERD (gastroesophageal reflux disease)   . Hypertension   . Shortness of breath     Starting May 2015  . Headache     stress and tension  . Neuromuscular disorder     restless legs and leg cramps  . Arthritis      History  Smoking status  . Former Smoker -- 1.00 packs/day for 30 years  . Types: Cigarettes  . Quit date: 04/08/2000  Smokeless tobacco  . Never Used    History  Alcohol Use  . Yes    Comment: rare     No Known Allergies  Current Outpatient Prescriptions  Medication Sig Dispense Refill  . acetaminophen (TYLENOL) 500 MG tablet Take 500 mg by mouth every 4 (four) hours as needed.    Marland Kitchen albuterol (PROVENTIL HFA;VENTOLIN HFA) 108 (90 BASE) MCG/ACT inhaler Inhale 2 puffs into the lungs every 6 (six) hours as needed for wheezing or shortness of breath.    Marland Kitchen ibuprofen (ADVIL,MOTRIN) 800 MG  tablet Take 800 mg by mouth every 8 (eight) hours as needed.    Marland Kitchen losartan (COZAAR) 50 MG tablet Take 50 mg by mouth daily.    Marland Kitchen omeprazole (PRILOSEC) 20 MG capsule Take 20 mg by mouth daily.    Marland Kitchen oxycodone (OXY-IR) 5 MG capsule Take 1 capsule (5 mg total) by mouth every 6 (six) hours as needed. 30 capsule 0  . polyethylene glycol (MIRALAX / GLYCOLAX) packet Take 17 g by mouth daily. 30 each 0  . rOPINIRole (REQUIP) 0.25 MG tablet Take 1 tablet (0.25 mg total) by mouth at bedtime. 30 tablet 0   No current facility-administered medications for this visit.       Physical Exam: BP 152/93 mmHg  Pulse 78  Resp 16  Ht 5\' 10"  (1.778 m)  Wt 271 lb (122.925 kg)  BMI 38.88 kg/m2  SpO2 97%  General appearance: alert and cooperative Neurologic: intact Heart: regular rate and rhythm, S1, S2 normal, no murmur, click, rub or gallop Lungs: diminished breath sounds bibasilar Abdomen: soft, non-tender; bowel sounds normal; no masses,  no organomegaly Extremities: extremities normal, atraumatic, no cyanosis or edema and Homans sign is negative, no sign of DVT Wound: the left chest tube sutures have slight erythema around the sutures were removed patient's left chest incisions well-healed   Diagnostic Studies & Laboratory data:     Recent Radiology Findings:   Dg Chest 2 View  02/28/2014   CLINICAL DATA:  Left pleural effusion, persistent.  Pain  EXAM: CHEST  2 VIEW  COMPARISON:  February 16, 2014  FINDINGS: Drainage catheter no longer appreciable. There is a fairly small left effusion with left base atelectasis. There is a minimal right effusion. There is underlying emphysematous change. There is no airspace consolidation. Heart size and pulmonary vascularity are normal. No pneumothorax. No adenopathy. No bone lesions.  IMPRESSION: Small apparently loculated left effusion with left base atelectasis. Minimal right effusion. Underlying emphysematous change. No appreciable airspace consolidation.    Electronically Signed   By: Lowella Grip M.D.   On: 02/28/2014 14:45      Recent Lab Findings: Lab Results  Component Value Date   WBC 15.2* 02/03/2014   HGB 11.5* 02/03/2014   HCT 35.6* 02/03/2014   PLT 362 02/03/2014   GLUCOSE 101* 02/03/2014   CHOL 145 12/29/2013   TRIG 129 12/29/2013   HDL 25* 12/29/2013   LDLCALC 94 12/29/2013   ALT 18 02/03/2014   AST 14 02/03/2014   NA 135* 02/03/2014   K 4.2 02/03/2014   CL 96 02/03/2014   CREATININE 0.87 02/03/2014   BUN 17 02/03/2014   CO2 26 02/03/2014   TSH 1.410 12/27/2013   INR 1.03 01/31/2014   HGBA1C 6.1* 10/06/2013      Assessment / Plan:      Patient with recurrent left pleural effusion now with drainage and talc pleurodesis of the recurrent left pleural effusion, pathology is negative for malignancy Pleurix is not out,  Patient had  rheumatology appointment last  Monday, was told not sure why he was there, but did "blood test" Stable postop will see back in 6 weeks with repeat chest xray  Grace Isaac MD      [address] [place] [address] Office [phone]   Beeper [phone]  02/28/2014 8:43 PM

## 2014-03-16 LAB — AFB CULTURE WITH SMEAR (NOT AT ARMC): Acid Fast Smear: NONE SEEN

## 2014-04-05 ENCOUNTER — Other Ambulatory Visit: Payer: Self-pay | Admitting: Surgery

## 2014-04-08 DIAGNOSIS — I2510 Atherosclerotic heart disease of native coronary artery without angina pectoris: Secondary | ICD-10-CM

## 2014-04-08 HISTORY — DX: Atherosclerotic heart disease of native coronary artery without angina pectoris: I25.10

## 2014-04-14 ENCOUNTER — Ambulatory Visit: Payer: BC Managed Care – PPO | Admitting: Cardiothoracic Surgery

## 2014-04-21 ENCOUNTER — Ambulatory Visit: Payer: BC Managed Care – PPO | Admitting: Cardiothoracic Surgery

## 2014-05-02 ENCOUNTER — Other Ambulatory Visit: Payer: Self-pay | Admitting: Cardiothoracic Surgery

## 2014-05-02 DIAGNOSIS — J90 Pleural effusion, not elsewhere classified: Secondary | ICD-10-CM

## 2014-05-03 ENCOUNTER — Ambulatory Visit
Admission: RE | Admit: 2014-05-03 | Discharge: 2014-05-03 | Disposition: A | Discharge status: Home / Self Care | Payer: BC Managed Care – PPO | Source: Ambulatory Visit | Attending: Cardiothoracic Surgery | Admitting: Cardiothoracic Surgery

## 2014-05-03 ENCOUNTER — Encounter: Payer: Self-pay | Admitting: Cardiothoracic Surgery

## 2014-05-03 ENCOUNTER — Ambulatory Visit (INDEPENDENT_AMBULATORY_CARE_PROVIDER_SITE_OTHER): Payer: BC Managed Care – PPO | Admitting: Cardiothoracic Surgery

## 2014-05-03 VITALS — BP 140/90 | HR 92 | Resp 20 | Ht 70.0 in | Wt 282.0 lb

## 2014-05-03 DIAGNOSIS — J948 Other specified pleural conditions: Secondary | ICD-10-CM

## 2014-05-03 DIAGNOSIS — J90 Pleural effusion, not elsewhere classified: Secondary | ICD-10-CM

## 2014-05-03 NOTE — Progress Notes (Signed)
[address]       [place] [address]             [phone]      Kester R Michael [place] Medical Record #[id number] Date of Birth: 1960/06/18  Referring: Alonza Bogus, MD Primary Care: [facility], [place]  Chief Complaint:   POST OP FOLLOW UP 02/01/2014 OPERATIVE REPORT PREOPERATIVE DIAGNOSIS: Recurrent left pleural effusion. POSTOPERATIVE DIAGNOSIS: Recurrent left pleural effusion. Final pathology pending. PROCEDURE PERFORMED: Bronchoscopy, left video-assisted thoracoscopy, drainage of left pleural effusion, pleural biopsies, talc pleurodesis, placement of chest tube and PleurX catheter. SURGEON: Lanelle Bal, MD  History of Present Illness:     Patient doing well following drainage of left pleural effusion intact pleurodesis and placement of Pleurx cath. He's draining between 50 mL and 75 ml every other day. He shortness of breath has improved. He was  Evaluated  by rheumatology. Pathology on his pleural fluid and pleural biopsy has been negative for malignancy. 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  The patient denies any symptoms specific to his recent surgery.  Of concern however is that he complained of midsternal chest burning directly related to shoveling snow and the recent storm, he denies any pain at rest or with normal activities. He gives a history of some "cardiac event" 10 or 15 years ago but does not remember any of the details.   Past Medical History  Diagnosis Date  . GERD (gastroesophageal reflux disease)   . Hypertension   . Shortness of breath     Starting May 2015  . Headache     stress and tension  . Neuromuscular disorder     restless legs and leg cramps  . Arthritis      History  Smoking status  . Former Smoker -- 1.00 packs/day for 30 years  . Types: Cigarettes  . Quit date:  04/08/2000  Smokeless tobacco  . Never Used    History  Alcohol Use  . Yes    Comment: rare     No Known Allergies  Current Outpatient Prescriptions  Medication Sig Dispense Refill  . acetaminophen (TYLENOL) 500 MG tablet Take 500 mg by mouth every 4 (four) hours as needed.    Marland Kitchen albuterol (PROVENTIL HFA;VENTOLIN HFA) 108 (90 BASE) MCG/ACT inhaler Inhale 2 puffs into the lungs every 6 (six) hours as needed for wheezing or shortness of breath.    Marland Kitchen ibuprofen (ADVIL,MOTRIN) 800 MG tablet Take 800 mg by mouth every 8 (eight) hours as needed.    Marland Kitchen losartan (COZAAR) 50 MG tablet Take 50 mg by mouth daily.    Marland Kitchen omeprazole (PRILOSEC) 20 MG capsule Take 20 mg by mouth daily.    . polyethylene glycol (MIRALAX / GLYCOLAX) packet Take 17 g by mouth daily. 30 each 0  . rOPINIRole (REQUIP) 0.25 MG tablet Take 1 tablet (0.25 mg total) by mouth at bedtime. 30 tablet 0  . oxycodone (OXY-IR) 5 MG capsule Take 1 capsule (5 mg total) by mouth every 6 (six) hours as needed. (Patient not taking: Reported on 05/03/2014) 30 capsule 0   No current facility-administered medications for this visit.       Physical Exam: BP 140/90 mmHg  Pulse 92  Resp 20  Ht 5\' 10"  (1.778 m)  Wt 282 lb (127.914 kg)  BMI 40.46 kg/m2  SpO2 95%  General appearance: alert and cooperative Neurologic: intact Heart: regular rate and rhythm, S1, S2 normal, no murmur, click, rub or gallop Lungs: diminished breath sounds bibasilar Abdomen: soft, non-tender; bowel sounds normal; no masses,  no organomegaly Extremities: extremities normal, atraumatic, no cyanosis or edema and Homans sign is negative, no sign of DVT Wound: the left chest tube sutures have slight erythema around the sutures were removed patient's left chest incisions well-healed   Diagnostic Studies & Laboratory data:     Recent Radiology Findings:   Dg Chest 2 View  05/03/2014   CLINICAL DATA:  LEFT pleural effusion. History of LEFT sided talc pleurodesis  02/01/2014. Some shortness of breath with exertion.  EXAM: CHEST  2 VIEW  COMPARISON:  02/28/2014.  FINDINGS: Interval decrease in the small LEFT pleural effusion. This was previously described is probably loculated and while this may be true, there has been resorbtion of posterior pleural fluid on the lateral view.  There is mild persistent blunting of the LEFT costophrenic angle. Small RIGHT pleural effusion also remains present with blunting of the RIGHT costophrenic angle. The cardiopericardial silhouette is within normal limits for projection. Mediastinal contours are normal. Unchanged prominent costochondral calcification at the first rib ends. There is no airspace disease. No pulmonary edema. Basilar atelectasis. Mild pleural thickening along the LEFT chest wall compatible with previous pleurodesis.  IMPRESSION: 1. Improving small LEFT pleural effusion status post LEFT pleurodesis. 2. Unchanged small RIGHT pleural effusion.   Electronically Signed   By: Dereck Ligas M.D.   On: 05/03/2014 16:19   I have independently reviewed the above radiology studies  and reviewed the findings with the patient.    Recent Lab Findings: Lab Results  Component Value Date   WBC 15.2* 02/03/2014   HGB 11.5* 02/03/2014   HCT 35.6* 02/03/2014   PLT 362 02/03/2014   GLUCOSE 101* 02/03/2014   CHOL 145 12/29/2013   TRIG 129 12/29/2013   HDL 25* 12/29/2013   LDLCALC 94 12/29/2013   ALT 18 02/03/2014   AST 14 02/03/2014   NA 135* 02/03/2014   K 4.2 02/03/2014   CL 96 02/03/2014   CREATININE 0.87 02/03/2014   BUN 17 02/03/2014   CO2 26 02/03/2014   TSH 1.410 12/27/2013   INR 1.03 01/31/2014   HGBA1C 6.1* 10/06/2013      Assessment / Plan:   With the patient's exertionally related substernal chest pain related to shoveling snow , I have referred him to cardiology office for evaluation. He is also been counseled to avoid heavy exertion until he is evaluated, and 2 come to the emergency room immediately  should he have any recurrent symptoms and lower levels of activity.  Patient with recurrent left pleural effusion now with drainage and talc pleurodesis of the recurrent left pleural effusion, pathology is negative for malignancy  Patient had  rheumatology , was told he had no specific rheumatologic findings or abnormal tests. Stable postop will see back in 3 months with repeat chest xray  Grace Isaac MD      [address] [place] [address] Office [phone]   Beeper [phone]  05/03/2014 4:27 PM

## 2014-05-12 ENCOUNTER — Encounter: Payer: Self-pay | Admitting: Cardiology

## 2014-05-12 ENCOUNTER — Ambulatory Visit (INDEPENDENT_AMBULATORY_CARE_PROVIDER_SITE_OTHER): Payer: BC Managed Care – PPO | Admitting: Cardiology

## 2014-05-12 VITALS — BP 130/90 | HR 77 | Ht 70.0 in | Wt 279.0 lb

## 2014-05-12 DIAGNOSIS — R0602 Shortness of breath: Secondary | ICD-10-CM

## 2014-05-12 DIAGNOSIS — R079 Chest pain, unspecified: Secondary | ICD-10-CM

## 2014-05-12 DIAGNOSIS — I10 Essential (primary) hypertension: Secondary | ICD-10-CM

## 2014-05-12 NOTE — Patient Instructions (Signed)
Your physician has requested that you have an echocardiogram. Echocardiography is a painless test that uses sound waves to create images of your heart. It provides your doctor with information about the size and shape of your heart and how well your heart's chambers and valves are working. This procedure takes approximately one hour. There are no restrictions for this procedure.  Your physician has requested that you have a lexiscan myoview. For further information please visit [email or web address]. Please follow instruction sheet, as given.  Your physician recommends that you schedule a follow-up appointment AS NEEDED with Dr. Radford Pax pending your tests.

## 2014-05-12 NOTE — Progress Notes (Signed)
Cardiology Office Note   Date:  05/12/2014   ID:  Colin Lee, DOB [birth year], MRN [id number]  PCP:  Bronson Curb, PA-C  Cardiologist:   Sueanne Margarita, MD   No chief complaint on file.     History of Present Illness: Colin Lee is a 54 y.o. male who presents for evaluation of chest pain.  He has a history of recurrent left pleural effusion and has had drainage and talc pleurodesis .  He recently saw Dr. [name] back in followup and was complaining of substernal chest pain related to shoveling snow during recent snow storm.  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  He says that the exertional chest pain occurs daily with any exertion including walking.  He describes it as a sharp pain that does not radiate.  He also describes a chest pressure that is not exertional.  He gets SOB with the chest pain and does get diaphoretic but no nausea.  He has had chronic SOB since his lung surgery.  He has noticed a significant decline in his exercise tolerance.  He denies any LE edema, dizziness or syncope.  He has also noticed fluttering in his chest a few time daily lasting a few minutes at a time.  It is unrelated to his CP.    Past Medical History  Diagnosis Date  . GERD (gastroesophageal reflux disease)   . Hypertension   . Shortness of breath     Starting May 2015  . Headache     stress and tension  . Neuromuscular disorder     restless legs and leg cramps  . Arthritis     Past Surgical History  Procedure Laterality Date  . No prior surgery    . Thoracentesis Left 2015  . Colonoscopy w/ biopsies and polypectomy  2014    benign  . Video assisted thoracoscopy Left 02/01/2014    Procedure: VIDEO ASSISTED THORACOSCOPY;  Surgeon: Grace Isaac, MD;  Location: [place];  Service: Thoracic;  Laterality: Left;  . Pleural effusion drainage Left 02/01/2014    Procedure: DRAINAGE OF  PLEURAL EFFUSION;  Surgeon: Grace Isaac, MD;  Location: [place];  Service: Thoracic;  Laterality: Left;  . Video bronchoscopy N/A 02/01/2014    Procedure: VIDEO BRONCHOSCOPY;  Surgeon: Grace Isaac, MD;  Location: [place];  Service: Thoracic;  Laterality: N/A;  . Pleural biopsy Left 02/01/2014    Procedure: PLEURAL BIOPSY;  Surgeon: Grace Isaac, MD;  Location: [place];  Service: Thoracic;  Laterality: Left;  . Chest tube insertion Left 02/01/2014    Procedure: INSERTION PLEURAL DRAINAGE CATHETER;  Surgeon: Grace Isaac, MD;  Location: [place];  Service: Thoracic;  Laterality: Left;  . Talc pleurodesis Left 02/01/2014    Procedure: TALC PLEURADESIS;  Surgeon: Grace Isaac, MD;  Location: [place];  Service: Thoracic;  Laterality: Left;  . Removal of pleural drainage catheter Left 02/16/2014    Procedure: REMOVAL OF PLEURAL DRAINAGE CATHETER;  Surgeon: Grace Isaac, MD;  Location: [place];  Service: Thoracic;  Laterality: Left;     Current Outpatient Prescriptions  Medication Sig Dispense Refill  . albuterol (PROVENTIL HFA;VENTOLIN HFA) 108 (90 BASE) MCG/ACT inhaler Inhale 2 puffs into the lungs every 6 (six) hours as needed for wheezing or shortness of breath.    Marland Kitchen ibuprofen (ADVIL,MOTRIN) 800 MG tablet Take 800 mg by mouth every  8 (eight) hours as needed.    Marland Kitchen losartan (COZAAR) 50 MG tablet Take 50 mg by mouth daily.    Marland Kitchen omeprazole (PRILOSEC) 20 MG capsule Take 20 mg by mouth daily.    . polyethylene glycol (MIRALAX / GLYCOLAX) packet Take 17 g by mouth daily. 30 each 0  . rOPINIRole (REQUIP) 0.25 MG tablet Take 1 tablet (0.25 mg total) by mouth at bedtime. 30 tablet 0   No current facility-administered medications for this visit.    Allergies:   Review of patient's allergies indicates no known allergies.    Social History:  The patient  reports that he quit smoking about 14 years ago. His smoking use included Cigarettes. He has a 30 pack-year smoking history. He has  never used smokeless tobacco. He reports that he drinks alcohol. He reports that he does not use illicit drugs.   Family History:  The patient's family history is not on file. He was adopted.    ROS:  Please see the history of present illness.   Otherwise, review of systems are positive for none.   All other systems are reviewed and negative.    PHYSICAL EXAM: VS:  BP 130/90 mmHg  Pulse 77  Ht 5\' 10"  (1.778 m)  Wt 279 lb (126.554 kg)  BMI 40.03 kg/m2 , BMI Body mass index is 40.03 kg/(m^2). GEN: Well nourished, well developed, in no acute distress HEENT: normal Neck: no JVD, carotid bruits, or masses Cardiac: RRR; no murmurs, rubs, or gallops,no edema  Respiratory:  clear to auscultation bilaterally, normal work of breathing GI: soft, nontender, nondistended, + BS MS: no deformity or atrophy Skin: warm and dry, no rash Neuro:  Strength and sensation are intact Psych: euthymic mood, full affect   EKG:  EKG is ordered today. The ekg ordered today demonstrates NSR with no ST changes   Recent Labs: 10/06/2013: Pro B Natriuretic peptide (BNP) 79.8 12/27/2013: TSH 1.410 02/03/2014: ALT 18; BUN 17; Creatinine 0.87; Hemoglobin 11.5*; Platelets 362; Potassium 4.2; Sodium 135*    Lipid Panel    Component Value Date/Time   CHOL 145 12/29/2013 1034   TRIG 129 12/29/2013 1034   HDL 25* 12/29/2013 1034   CHOLHDL 5.8 12/29/2013 1034   VLDL 26 12/29/2013 1034   LDLCALC 94 12/29/2013 1034      Wt Readings from Last 3 Encounters:  05/12/14 279 lb (126.554 kg)  05/03/14 282 lb (127.914 kg)  02/28/14 271 lb (122.925 kg)        ASSESSMENT AND PLAN:  1.  Exertional chest pain while shoveling snow - worrisome for exertional angina.  His pain is somewhat atypical in that it is sharp but the exertional component is very concerning.  His EKG is nonischemic.  His CRF include male sex, age >55 and HTN.  He was adopted so he does not know his biological family history. He has a history of  cath in 1990's for CP with minimal significant disease.   I will get a Lexiscan myoview to rule out ischemia.  I will also get a 2D echo to rule out pericardial effusion.  I have recommended that he start a baby ASA daily.  His last LDL was 94 2.  Chronic left pleural effusion s/p drainage and talc pleurodesis 3.  HTN with borderline control.  Continue ARB   Current medicines are reviewed at length with the patient today.  The patient does not have concerns regarding medicines.  The following changes have been made:  no change  No orders of the defined types were placed in this encounter.     Disposition:   FU with me PRN pending results of stress test  SignedSueanne Margarita, MD  05/12/2014 10:46 AM    [facility] [address], [place], [place]  [address] Phone: [phone]; Fax: [phone]

## 2014-05-19 ENCOUNTER — Ambulatory Visit (HOSPITAL_COMMUNITY): Payer: BC Managed Care – PPO | Attending: Cardiology | Admitting: Radiology

## 2014-05-19 ENCOUNTER — Other Ambulatory Visit (HOSPITAL_COMMUNITY): Payer: BC Managed Care – PPO

## 2014-05-19 DIAGNOSIS — R079 Chest pain, unspecified: Secondary | ICD-10-CM | POA: Diagnosis not present

## 2014-05-19 DIAGNOSIS — I10 Essential (primary) hypertension: Secondary | ICD-10-CM | POA: Diagnosis not present

## 2014-05-19 DIAGNOSIS — R0602 Shortness of breath: Secondary | ICD-10-CM | POA: Insufficient documentation

## 2014-05-19 DIAGNOSIS — I2510 Atherosclerotic heart disease of native coronary artery without angina pectoris: Secondary | ICD-10-CM | POA: Diagnosis not present

## 2014-05-19 MED ORDER — TECHNETIUM TC 99M SESTAMIBI GENERIC - CARDIOLITE
33.0000 | Freq: Once | INTRAVENOUS | Status: AC | PRN
  Administered 2014-05-19: 33 via INTRAVENOUS

## 2014-05-19 MED ORDER — REGADENOSON 0.4 MG/5ML IV SOLN
0.4000 mg | Freq: Once | INTRAVENOUS | Status: AC
Start: 2014-05-19 — End: 2014-05-19
  Administered 2014-05-19: 0.4 mg via INTRAVENOUS

## 2014-05-19 NOTE — Progress Notes (Signed)
[facility] [address] [place]  [address] [phone]    Cardiology Nuclear Med Study  Colin Lee is a 54 y.o. male     MRN : [id number]     DOB: [birth year]  Procedure Date: 05/19/2014  Nuclear Med Background Indication for Stress Test:  Evaluation for Ischemia and Follow up CAD History:  CAD (non obstructive), Asthma Cardiac Risk Factors: Hypertension  Symptoms:  Chest Pain, Chest Pain with Exertion (last date of chest discomfort was yesterday) and SOB   Nuclear Pre-Procedure Caffeine/Decaff Intake:  None> 12 hrs NPO After: 8:00pm   Lungs:  clear O2 Sat: 93% on room air. IV 0.9% NS with Angio Cath:  20g  IV Site: R Antecubital x 1, tolerated well IV Started by:  Irven Baltimore, RN  Chest Size (in):  50+ Cup Size: n/a  Height: 5\' 10"  (1.778 m)  Weight:  277 lb (125.646 kg)  BMI:  Body mass index is 39.75 kg/(m^2). Tech Comments:  Patient took Cozaar this am. Irven Baltimore, RN.    Nuclear Med Study 1 or 2 day study: 2 day  Stress Test Type:  Treadmill/Lexiscan  Reading MD: N/A  Order Authorizing Provider:  Fransico Him, MD  Resting Radionuclide: Technetium 30m Sestamibi  Resting Radionuclide Dose: 33.0 mCi  On      05-26-14  Stress Radionuclide:  Technetium 68m Sestamibi  Stress Radionuclide Dose: 33.0 mCi  On         05-24-14          Stress Protocol Rest HR: 86 Stress HR: 117  Rest BP: 146/78 Stress BP: 122/88  Exercise Time (min): n/a METS: n/a   Predicted Max HR: 169 bpm % Max HR: 69.23 bpm Rate Pressure Product: 19773   Dose of Adenosine (mg):  n/a Dose of Lexiscan: 0.4 mg  Dose of Atropine (mg): n/a Dose of Dobutamine: n/a mcg/kg/min (at max HR)  Stress Test Technologist: Glade Lloyd, BS-ES  Nuclear Technologist:  Earl Many, CNMT     Rest Procedure:  Myocardial perfusion imaging was performed at rest 45 minutes following the intravenous administration of Technetium 70m Sestamibi. Rest ECG: NSR - Anterior  infarct  Stress Procedure:  The patient received IV Lexiscan 0.4 mg over 15-seconds.  Technetium 7m Sestamibi injected at 30-seconds.  Quantitative spect images were obtained after a 45 minute delay.  During the infusion of Lexiscan the patient complained of SOB and chest tightness that resolved in recovery.  Stress ECG: No significant change from baseline ECG  QPS Raw Data Images:  Mild diaphragmatic attenuation.  Normal left ventricular size. Stress Images:  There is decreased uptake in the inferior and basal inferolateral wall. Rest Images:  There is decreased uptake in the inferior and basal inferolateral wall. Subtraction (SDS):  There is a fixed inferior defect that is most consistent with diaphragmatic attenuation. Transient Ischemic Dilatation (Normal <1.22):  0.92 Lung/Heart Ratio (Normal <0.45):  0.41  Quantitative Gated Spect Images QGS EDV:  120 ml QGS ESV:  55 ml  Impression Exercise Capacity:  Lexiscan with no exercise. BP Response:  Normal blood pressure response. Clinical Symptoms:  Typical chest pain. ECG Impression:  No significant ST segment change suggestive of ischemia. Comparison with Prior Nuclear Study: No images to compare  Overall Impression:  Low risk stress nuclear study with a small in size, mild in intensity, fixed defect consistent with diaphragmatic attenuation.  There is no evidence of ischemia.  .  LV Ejection Fraction: 54%.  LV Wall Motion:  NL LV Function; NL Wall Motion  Signed; Fransico Him, MD [facility] 05/26/2014

## 2014-05-26 ENCOUNTER — Ambulatory Visit (HOSPITAL_COMMUNITY): Payer: BC Managed Care – PPO | Attending: Cardiology

## 2014-05-26 ENCOUNTER — Ambulatory Visit (HOSPITAL_BASED_OUTPATIENT_CLINIC_OR_DEPARTMENT_OTHER): Payer: BC Managed Care – PPO | Admitting: Radiology

## 2014-05-26 ENCOUNTER — Other Ambulatory Visit (HOSPITAL_COMMUNITY): Payer: BC Managed Care – PPO | Admitting: *Deleted

## 2014-05-26 DIAGNOSIS — Z87891 Personal history of nicotine dependence: Secondary | ICD-10-CM | POA: Diagnosis not present

## 2014-05-26 DIAGNOSIS — R0989 Other specified symptoms and signs involving the circulatory and respiratory systems: Secondary | ICD-10-CM

## 2014-05-26 DIAGNOSIS — E669 Obesity, unspecified: Secondary | ICD-10-CM | POA: Insufficient documentation

## 2014-05-26 DIAGNOSIS — I10 Essential (primary) hypertension: Secondary | ICD-10-CM | POA: Diagnosis not present

## 2014-05-26 DIAGNOSIS — R079 Chest pain, unspecified: Secondary | ICD-10-CM

## 2014-05-26 DIAGNOSIS — R0602 Shortness of breath: Secondary | ICD-10-CM

## 2014-05-26 MED ORDER — TECHNETIUM TC 99M SESTAMIBI GENERIC - CARDIOLITE
30.0000 | Freq: Once | INTRAVENOUS | Status: AC | PRN
  Administered 2014-05-26: 30 via INTRAVENOUS

## 2014-05-26 MED ORDER — PERFLUTREN LIPID MICROSPHERE
5.0000 mL | Freq: Once | INTRAVENOUS | Status: AC
  Administered 2014-05-26: 5 mL via INTRAVENOUS

## 2014-05-26 NOTE — Progress Notes (Signed)
Echocardiogram performed with Definity.  

## 2014-05-27 ENCOUNTER — Encounter (HOSPITAL_COMMUNITY): Payer: BC Managed Care – PPO

## 2014-05-27 ENCOUNTER — Other Ambulatory Visit (HOSPITAL_COMMUNITY): Payer: BC Managed Care – PPO

## 2014-05-30 ENCOUNTER — Inpatient Hospital Stay (HOSPITAL_COMMUNITY)
Admission: EM | Admit: 2014-05-30 | Discharge: 2014-06-09 | DRG: 246 | Disposition: A | Discharge status: Home / Self Care | Payer: BC Managed Care – PPO | Attending: Internal Medicine | Admitting: Internal Medicine

## 2014-05-30 ENCOUNTER — Encounter (HOSPITAL_COMMUNITY): Payer: Self-pay | Admitting: *Deleted

## 2014-05-30 ENCOUNTER — Emergency Department (HOSPITAL_COMMUNITY): Payer: BC Managed Care – PPO

## 2014-05-30 DIAGNOSIS — J918 Pleural effusion in other conditions classified elsewhere: Secondary | ICD-10-CM | POA: Diagnosis present

## 2014-05-30 DIAGNOSIS — R509 Fever, unspecified: Secondary | ICD-10-CM | POA: Diagnosis present

## 2014-05-30 DIAGNOSIS — M2550 Pain in unspecified joint: Secondary | ICD-10-CM | POA: Diagnosis present

## 2014-05-30 DIAGNOSIS — I10 Essential (primary) hypertension: Secondary | ICD-10-CM | POA: Diagnosis present

## 2014-05-30 DIAGNOSIS — Z955 Presence of coronary angioplasty implant and graft: Secondary | ICD-10-CM

## 2014-05-30 DIAGNOSIS — J90 Pleural effusion, not elsewhere classified: Secondary | ICD-10-CM

## 2014-05-30 DIAGNOSIS — R918 Other nonspecific abnormal finding of lung field: Secondary | ICD-10-CM | POA: Diagnosis present

## 2014-05-30 DIAGNOSIS — M1990 Unspecified osteoarthritis, unspecified site: Secondary | ICD-10-CM | POA: Diagnosis present

## 2014-05-30 DIAGNOSIS — R0602 Shortness of breath: Secondary | ICD-10-CM

## 2014-05-30 DIAGNOSIS — R0600 Dyspnea, unspecified: Secondary | ICD-10-CM

## 2014-05-30 DIAGNOSIS — R748 Abnormal levels of other serum enzymes: Secondary | ICD-10-CM | POA: Diagnosis present

## 2014-05-30 DIAGNOSIS — J189 Pneumonia, unspecified organism: Secondary | ICD-10-CM

## 2014-05-30 DIAGNOSIS — I200 Unstable angina: Secondary | ICD-10-CM | POA: Diagnosis present

## 2014-05-30 DIAGNOSIS — D649 Anemia, unspecified: Secondary | ICD-10-CM | POA: Diagnosis present

## 2014-05-30 DIAGNOSIS — R0789 Other chest pain: Secondary | ICD-10-CM

## 2014-05-30 DIAGNOSIS — I25110 Atherosclerotic heart disease of native coronary artery with unstable angina pectoris: Principal | ICD-10-CM | POA: Diagnosis present

## 2014-05-30 DIAGNOSIS — I209 Angina pectoris, unspecified: Secondary | ICD-10-CM | POA: Insufficient documentation

## 2014-05-30 DIAGNOSIS — Z87891 Personal history of nicotine dependence: Secondary | ICD-10-CM

## 2014-05-30 DIAGNOSIS — Z7982 Long term (current) use of aspirin: Secondary | ICD-10-CM

## 2014-05-30 DIAGNOSIS — R7989 Other specified abnormal findings of blood chemistry: Secondary | ICD-10-CM

## 2014-05-30 DIAGNOSIS — R079 Chest pain, unspecified: Secondary | ICD-10-CM

## 2014-05-30 DIAGNOSIS — M791 Myalgia, unspecified site: Secondary | ICD-10-CM | POA: Diagnosis present

## 2014-05-30 DIAGNOSIS — R062 Wheezing: Secondary | ICD-10-CM

## 2014-05-30 DIAGNOSIS — R778 Other specified abnormalities of plasma proteins: Secondary | ICD-10-CM

## 2014-05-30 DIAGNOSIS — K219 Gastro-esophageal reflux disease without esophagitis: Secondary | ICD-10-CM | POA: Diagnosis present

## 2014-05-30 HISTORY — DX: Unspecified asthma, uncomplicated: J45.909

## 2014-05-30 LAB — CBC WITH DIFFERENTIAL/PLATELET
Basophils Absolute: 0 10*3/uL (ref 0.0–0.1)
Basophils Relative: 0 % (ref 0–1)
Eosinophils Absolute: 0.5 10*3/uL (ref 0.0–0.7)
Eosinophils Relative: 5 % (ref 0–5)
HCT: 33.8 % — ABNORMAL LOW (ref 39.0–52.0)
Hemoglobin: 11.1 g/dL — ABNORMAL LOW (ref 13.0–17.0)
Lymphocytes Relative: 15 % (ref 12–46)
Lymphs Abs: 1.6 10*3/uL (ref 0.7–4.0)
MCH: 26.9 pg (ref 26.0–34.0)
MCHC: 32.8 g/dL (ref 30.0–36.0)
MCV: 81.8 fL (ref 78.0–100.0)
Monocytes Absolute: 0.8 10*3/uL (ref 0.1–1.0)
Monocytes Relative: 8 % (ref 3–12)
Neutro Abs: 7.8 10*3/uL — ABNORMAL HIGH (ref 1.7–7.7)
Neutrophils Relative %: 72 % (ref 43–77)
Platelets: 396 10*3/uL (ref 150–400)
RBC: 4.13 MIL/uL — ABNORMAL LOW (ref 4.22–5.81)
RDW: 13.1 % (ref 11.5–15.5)
WBC: 10.7 10*3/uL — ABNORMAL HIGH (ref 4.0–10.5)

## 2014-05-30 LAB — BASIC METABOLIC PANEL
Anion gap: 7 (ref 5–15)
BUN: 11 mg/dL (ref 6–23)
CO2: 27 mmol/L (ref 19–32)
Calcium: 8.7 mg/dL (ref 8.4–10.5)
Chloride: 103 mmol/L (ref 96–112)
Creatinine, Ser: 0.99 mg/dL (ref 0.50–1.35)
GFR calc Af Amer: >90 mL/min (ref >90)
GFR calc non Af Amer: >90 mL/min (ref >90)
Glucose, Bld: 107 mg/dL — ABNORMAL HIGH (ref 70–99)
Potassium: 4.1 mmol/L (ref 3.5–5.1)
Sodium: 137 mmol/L (ref 135–145)

## 2014-05-30 LAB — D-DIMER, QUANTITATIVE (NOT AT ARMC): D-Dimer, Quant: 4.97 ug/mL-FEU — ABNORMAL HIGH (ref 0.00–0.48)

## 2014-05-30 LAB — BRAIN NATRIURETIC PEPTIDE: B Natriuretic Peptide: 92.5 pg/mL (ref 0.0–100.0)

## 2014-05-30 LAB — TROPONIN I: Troponin I: 0.04 ng/mL — ABNORMAL HIGH (ref <0.031)

## 2014-05-30 MED ORDER — HEPARIN (PORCINE) IN NACL 100-0.45 UNIT/ML-% IJ SOLN
1600.0000 [IU]/h | INTRAMUSCULAR | Status: DC
  Administered 2014-05-30: 1300 [IU]/h via INTRAVENOUS
  Filled 2014-05-30 (×2): qty 250

## 2014-05-30 MED ORDER — HEPARIN BOLUS VIA INFUSION
4000.0000 [IU] | Freq: Once | INTRAVENOUS | Status: AC
  Administered 2014-05-30: 4000 [IU] via INTRAVENOUS
  Filled 2014-05-30: qty 4000

## 2014-05-30 MED ORDER — ASPIRIN 81 MG PO CHEW
81.0000 mg | CHEWABLE_TABLET | Freq: Once | ORAL | Status: AC
  Administered 2014-05-30: 81 mg via ORAL
  Filled 2014-05-30: qty 1

## 2014-05-30 MED ORDER — IPRATROPIUM-ALBUTEROL 0.5-2.5 (3) MG/3ML IN SOLN
3.0000 mL | Freq: Once | RESPIRATORY_TRACT | Status: AC
  Administered 2014-05-30: 3 mL via RESPIRATORY_TRACT
  Filled 2014-05-30: qty 3

## 2014-05-30 NOTE — Progress Notes (Signed)
ANTICOAGULATION CONSULT NOTE - Initial Consult  Pharmacy Consult for Heparin Indication: chest pain/ACS  No Known Allergies  Patient Measurements: Height: 5\' 10"  (177.8 cm) Weight: 275 lb (124.739 kg) IBW/kg (Calculated) : 73 Heparin Dosing Weight: 101 kg  Vital Signs: Temp: 100.1 F (37.8 C) (02/22 1949) Temp Source: Oral (02/22 1949) BP: 162/101 mmHg (02/22 1949) Pulse Rate: 109 (02/22 1949)  Labs:  Recent Labs  05/30/14 2005  HGB 11.1*  HCT 33.8*  PLT 396  CREATININE 0.99  TROPONINI 0.04*    Estimated Creatinine Clearance: 117 mL/min (by C-G formula based on Cr of 0.99).   Medical History: Past Medical History  Diagnosis Date  . GERD (gastroesophageal reflux disease)   . Hypertension   . Shortness of breath     Starting May 2015  . Headache     stress and tension  . Neuromuscular disorder     restless legs and leg cramps  . Arthritis     Medications:   (Not in a hospital admission) Scheduled:  . aspirin  81 mg Oral Once   Infusions:    Assessment: 54yo male with history of HTN, GERD, and arthritis presents with SOB and chest pain. Pharmacy is consulted to dose heparin for ACS/chest pain. Trop 0.04, Hgb 11/1, Plt 396, sCr 0.99.  Goal of Therapy:  Heparin level 0.3-0.7 units/ml Monitor platelets by anticoagulation protocol: Yes   Plan:  Give 4000 units bolus x 1 Start heparin infusion at 1300 units/hr Check anti-Xa level in 6 hours and daily while on heparin Continue to monitor H&H and platelets  Monitor s/sx of bleeding  Andrey Cota. Diona Foley, PharmD Clinical Pharmacist Pager [phone] 05/30/2014,9:06 PM

## 2014-05-30 NOTE — ED Provider Notes (Signed)
CSN: [id number]     Arrival date & time 05/30/14  1937 History   First MD Initiated Contact with Patient 05/30/14 2048     Chief Complaint  Patient presents with  . Chest Pain  . Shortness of Breath     (Consider location/radiation/quality/duration/timing/severity/associated sxs/prior Treatment) Patient is a 54 y.o. male presenting with chest pain and shortness of breath. The history is provided by the patient.  Chest Pain Associated symptoms: shortness of breath   Shortness of Breath Associated symptoms: chest pain   He had left-sided pleurodesis done last October because of recurrent pleural effusions. He been doing reasonably well until about 7 weeks ago when he started having exertional chest pain and dyspnea. This is been coming on with progressively less exertion. Pain is sharp and in the central part of the chest. When he stops, pain initially asked she gets worse but then subsides over approximately 5-10 minutes depending on how much exertion he had been doing. It has gotten to the point where he can get chest pain and shortness of breath from only walking about 30 feet. He has also noticed a mild, nonproductive cough and he has had some night sweats. He had been evaluated for possible lupus and was seen by rheumatologist did not feel he had any rheumatologic condition. He has been noticing pain in his hands, wrists, elbows. He saw his PCP did Monday who felt that he had fluid buildup in his lungs again and send him to the ED.  Past Medical History  Diagnosis Date  . GERD (gastroesophageal reflux disease)   . Hypertension   . Shortness of breath     Starting May 2015  . Headache     stress and tension  . Neuromuscular disorder     restless legs and leg cramps  . Arthritis    Past Surgical History  Procedure Laterality Date  . No prior surgery    . Thoracentesis Left 2015  . Colonoscopy w/ biopsies and polypectomy  2014    benign  . Video assisted thoracoscopy Left  02/01/2014    Procedure: VIDEO ASSISTED THORACOSCOPY;  Surgeon: Grace Isaac, MD;  Location: [place];  Service: Thoracic;  Laterality: Left;  . Pleural effusion drainage Left 02/01/2014    Procedure: DRAINAGE OF PLEURAL EFFUSION;  Surgeon: Grace Isaac, MD;  Location: [place];  Service: Thoracic;  Laterality: Left;  . Video bronchoscopy N/A 02/01/2014    Procedure: VIDEO BRONCHOSCOPY;  Surgeon: Grace Isaac, MD;  Location: [place];  Service: Thoracic;  Laterality: N/A;  . Pleural biopsy Left 02/01/2014    Procedure: PLEURAL BIOPSY;  Surgeon: Grace Isaac, MD;  Location: [place];  Service: Thoracic;  Laterality: Left;  . Chest tube insertion Left 02/01/2014    Procedure: INSERTION PLEURAL DRAINAGE CATHETER;  Surgeon: Grace Isaac, MD;  Location: [place];  Service: Thoracic;  Laterality: Left;  . Talc pleurodesis Left 02/01/2014    Procedure: TALC PLEURADESIS;  Surgeon: Grace Isaac, MD;  Location: [place];  Service: Thoracic;  Laterality: Left;  . Removal of pleural drainage catheter Left 02/16/2014    Procedure: REMOVAL OF PLEURAL DRAINAGE CATHETER;  Surgeon: Grace Isaac, MD;  Location: [place];  Service: Thoracic;  Laterality: Left;   Family History  Problem Relation Age of Onset  . Adopted: Yes   History  Substance Use Topics  . Smoking status: Former Smoker -- 1.00 packs/day for 30 years    Types: Cigarettes  Quit date: 04/08/2000  . Smokeless tobacco: Never Used  . Alcohol Use: Yes     Comment: rare    Review of Systems  Respiratory: Positive for shortness of breath.   Cardiovascular: Positive for chest pain.  All other systems reviewed and are negative.     Allergies  Review of patient's allergies indicates no known allergies.  Home Medications   Prior to Admission medications   Medication Sig Start Date End Date Taking? Authorizing Provider  albuterol (PROVENTIL HFA;VENTOLIN HFA) 108 (90 BASE) MCG/ACT inhaler Inhale 2 puffs into the lungs  every 6 (six) hours as needed for wheezing or shortness of breath.   Yes Historical Provider, MD  aspirin EC 81 MG tablet Take 81 mg by mouth daily.   Yes Historical Provider, MD  ibuprofen (ADVIL,MOTRIN) 800 MG tablet Take 800 mg by mouth every 8 (eight) hours as needed for mild pain or moderate pain.    Yes Historical Provider, MD  losartan (COZAAR) 50 MG tablet Take 50 mg by mouth daily.   Yes Historical Provider, MD  Multiple Vitamins-Minerals (CENTRUM ADULTS PO) Take 1 tablet by mouth daily.   Yes Historical Provider, MD  omeprazole (PRILOSEC) 20 MG capsule Take 20 mg by mouth daily.   Yes Historical Provider, MD  polyethylene glycol (MIRALAX / GLYCOLAX) packet Take 17 g by mouth daily. Patient taking differently: Take 17 g by mouth daily as needed for mild constipation or moderate constipation.  10/30/13  Yes Samuella Cota, MD  rOPINIRole (REQUIP) 0.25 MG tablet Take 1 tablet (0.25 mg total) by mouth at bedtime. 10/11/13  Yes Kathie Dike, MD   BP 162/101 mmHg  Pulse 109  Temp(Src) 100.1 F (37.8 C) (Oral)  Resp 20  Ht 5\' 10"  (1.778 m)  Wt 275 lb (124.739 kg)  BMI 39.46 kg/m2  SpO2 95% Physical Exam  Nursing note and vitals reviewed.  54 year old male, resting comfortably and in no acute distress. Vital signs are significant for tachycardia and hypertension. Temperature of 100.1 which is not technically a fever. Oxygen saturation is 95%, which is normal. Head is normocephalic and atraumatic. PERRLA, EOMI. Oropharynx is clear. Neck is nontender and supple without adenopathy or JVD. Back is nontender and there is no CVA tenderness. Lungs have moderate wheezing with forced exhalation and occasional wheezing with tidal breaths. No rales are appreciated. Chest is tender along the right lateral lower rib cage. Heart has regular rate and rhythm without murmur. Abdomen is soft, flat, nontender without masses or hepatosplenomegaly and peristalsis is normoactive. Extremities have 1+  edema, full range of motion is present. Skin is warm and dry without rash. Neurologic: Mental status is normal, cranial nerves are intact, there are no motor or sensory deficits.  ED Course  Procedures (including critical care time) Labs Review Results for orders placed or performed during the hospital encounter of 05/30/14  CBC with Differential  Result Value Ref Range   WBC 10.7 (H) 4.0 - 10.5 K/uL   RBC 4.13 (L) 4.22 - 5.81 MIL/uL   Hemoglobin 11.1 (L) 13.0 - 17.0 g/dL   HCT 33.8 (L) 39.0 - 52.0 %   MCV 81.8 78.0 - 100.0 fL   MCH 26.9 26.0 - 34.0 pg   MCHC 32.8 30.0 - 36.0 g/dL   RDW 13.1 11.5 - 15.5 %   Platelets 396 150 - 400 K/uL   Neutrophils Relative % 72 43 - 77 %   Neutro Abs 7.8 (H) 1.7 - 7.7 K/uL  Lymphocytes Relative 15 12 - 46 %   Lymphs Abs 1.6 0.7 - 4.0 K/uL   Monocytes Relative 8 3 - 12 %   Monocytes Absolute 0.8 0.1 - 1.0 K/uL   Eosinophils Relative 5 0 - 5 %   Eosinophils Absolute 0.5 0.0 - 0.7 K/uL   Basophils Relative 0 0 - 1 %   Basophils Absolute 0.0 0.0 - 0.1 K/uL  Basic metabolic panel  Result Value Ref Range   Sodium 137 135 - 145 mmol/L   Potassium 4.1 3.5 - 5.1 mmol/L   Chloride 103 96 - 112 mmol/L   CO2 27 19 - 32 mmol/L   Glucose, Bld 107 (H) 70 - 99 mg/dL   BUN 11 6 - 23 mg/dL   Creatinine, Ser 0.99 0.50 - 1.35 mg/dL   Calcium 8.7 8.4 - 10.5 mg/dL   GFR calc non Af Amer >90 >90 mL/min   GFR calc Af Amer >90 >90 mL/min   Anion gap 7 5 - 15  Troponin I  Result Value Ref Range   Troponin I 0.04 (H) <0.031 ng/mL  D-dimer, quantitative  Result Value Ref Range   D-Dimer, Quant 4.97 (H) 0.00 - 0.48 ug/mL-FEU  Brain natriuretic peptide  Result Value Ref Range   B Natriuretic Peptide 92.5 0.0 - 100.0 pg/mL   Imaging Review Dg Chest 2 View  05/30/2014   CLINICAL DATA:  Left-sided chest pain and shortness of breath for 3 days. Multiple prior left thoracentesis he has over the past 5 years.  EXAM: CHEST  2 VIEW  COMPARISON:  05/03/2014   FINDINGS: Mild cardiac enlargement with increased pulmonary vascularity. No edema or consolidation. Small bilateral pleural effusions with basilar atelectasis. Appearance is similar to prior study. No pneumothorax. Mediastinal contours appear intact.  IMPRESSION: Bilateral pleural effusions with basilar atelectasis. Mild cardiac enlargement and pulmonary vascular congestion.   Electronically Signed   By: Lucienne Capers M.D.   On: 05/30/2014 20:53   Images viewed by me.   EKG Interpretation   Date/Time:  Monday May 30 2014 19:41:16 EST Ventricular Rate:  112 PR Interval:  120 QRS Duration: 94 QT Interval:  322 QTC Calculation: 439 R Axis:   28 Text Interpretation:  Sinus tachycardia Otherwise normal ECG When compared  with ECG of 01/31/2014, HEART RATE has increased Confirmed by [facility]  MD,  Alessia Gonsalez ([id number]) on 05/30/2014 7:46:15 PM      MDM   Final diagnoses:  Dyspnea  Wheezing  Elevated troponin I level  Elevated d-dimer    Exertional chest pain worrisome for cardiac cause. Old records are reviewed and he had been evaluated by cardiology and had a negative stress Myoview done 11 days ago. Troponin has come back slightly elevated and he will need to be anticoagulated while this is investigated further. His previous records also show that he has a positive ANA in a speckled pattern, and a positive c-ANCA. At this point, I am also going to screen him for pulmonary and wasn't. He will be given albuterol with ipratropium nebulizer treatment. At the very least, he will need to be admitted for serial troponins.  Following albuterol with ipratropium, lungs were clear and he did feel somewhat better. D-dimer is come back significantly elevated and he is sent for CT angiogram. He will need to be admitted regardless of the result, but I need to get that result to know which service to admit him to. Case will be signed out to Dr.  Lita Mains to get the  report on the CT angiogram.   Delora Fuel, MD 25/36/64 [id number]

## 2014-05-30 NOTE — ED Notes (Signed)
Patient presents with c/o CP and SOB.  Patient had a VAT in October on the left side

## 2014-05-31 ENCOUNTER — Emergency Department (HOSPITAL_COMMUNITY): Payer: BC Managed Care – PPO

## 2014-05-31 ENCOUNTER — Encounter (HOSPITAL_COMMUNITY): Payer: Self-pay

## 2014-05-31 DIAGNOSIS — K219 Gastro-esophageal reflux disease without esophagitis: Secondary | ICD-10-CM | POA: Diagnosis present

## 2014-05-31 DIAGNOSIS — R0602 Shortness of breath: Secondary | ICD-10-CM | POA: Diagnosis present

## 2014-05-31 DIAGNOSIS — I25110 Atherosclerotic heart disease of native coronary artery with unstable angina pectoris: Secondary | ICD-10-CM | POA: Diagnosis present

## 2014-05-31 DIAGNOSIS — R0600 Dyspnea, unspecified: Secondary | ICD-10-CM | POA: Insufficient documentation

## 2014-05-31 DIAGNOSIS — J189 Pneumonia, unspecified organism: Secondary | ICD-10-CM

## 2014-05-31 DIAGNOSIS — D509 Iron deficiency anemia, unspecified: Secondary | ICD-10-CM | POA: Diagnosis not present

## 2014-05-31 DIAGNOSIS — I10 Essential (primary) hypertension: Secondary | ICD-10-CM | POA: Diagnosis present

## 2014-05-31 DIAGNOSIS — J90 Pleural effusion, not elsewhere classified: Secondary | ICD-10-CM | POA: Diagnosis not present

## 2014-05-31 DIAGNOSIS — J918 Pleural effusion in other conditions classified elsewhere: Secondary | ICD-10-CM | POA: Diagnosis present

## 2014-05-31 DIAGNOSIS — R748 Abnormal levels of other serum enzymes: Secondary | ICD-10-CM | POA: Diagnosis present

## 2014-05-31 DIAGNOSIS — R918 Other nonspecific abnormal finding of lung field: Secondary | ICD-10-CM | POA: Diagnosis not present

## 2014-05-31 DIAGNOSIS — Z7982 Long term (current) use of aspirin: Secondary | ICD-10-CM | POA: Diagnosis not present

## 2014-05-31 DIAGNOSIS — R079 Chest pain, unspecified: Secondary | ICD-10-CM

## 2014-05-31 DIAGNOSIS — M1990 Unspecified osteoarthritis, unspecified site: Secondary | ICD-10-CM | POA: Diagnosis present

## 2014-05-31 DIAGNOSIS — D649 Anemia, unspecified: Secondary | ICD-10-CM | POA: Diagnosis present

## 2014-05-31 DIAGNOSIS — Z87891 Personal history of nicotine dependence: Secondary | ICD-10-CM | POA: Diagnosis not present

## 2014-05-31 DIAGNOSIS — R509 Fever, unspecified: Secondary | ICD-10-CM | POA: Diagnosis not present

## 2014-05-31 HISTORY — DX: Chest pain, unspecified: R07.9

## 2014-05-31 LAB — COMPREHENSIVE METABOLIC PANEL
ALT: 37 U/L (ref 0–53)
AST: 29 U/L (ref 0–37)
Albumin: 2.8 g/dL — ABNORMAL LOW (ref 3.5–5.2)
Alkaline Phosphatase: 67 U/L (ref 39–117)
Anion gap: 9 (ref 5–15)
BUN: 11 mg/dL (ref 6–23)
CO2: 25 mmol/L (ref 19–32)
Calcium: 8.4 mg/dL (ref 8.4–10.5)
Chloride: 104 mmol/L (ref 96–112)
Creatinine, Ser: 0.91 mg/dL (ref 0.50–1.35)
GFR calc Af Amer: >90 mL/min (ref >90)
GFR calc non Af Amer: >90 mL/min (ref >90)
Glucose, Bld: 87 mg/dL (ref 70–99)
Potassium: 3.9 mmol/L (ref 3.5–5.1)
Sodium: 138 mmol/L (ref 135–145)
Total Bilirubin: 0.7 mg/dL (ref 0.3–1.2)
Total Protein: 6.9 g/dL (ref 6.0–8.3)

## 2014-05-31 LAB — CBG MONITORING, ED
Glucose-Capillary: 93 mg/dL (ref 70–99)
Glucose-Capillary: 79 mg/dL (ref 70–99)

## 2014-05-31 LAB — CBC WITH DIFFERENTIAL/PLATELET
Basophils Absolute: 0 10*3/uL (ref 0.0–0.1)
Basophils Relative: 0 % (ref 0–1)
Eosinophils Absolute: 0.5 10*3/uL (ref 0.0–0.7)
Eosinophils Relative: 6 % — ABNORMAL HIGH (ref 0–5)
HCT: 32.9 % — ABNORMAL LOW (ref 39.0–52.0)
Hemoglobin: 10.7 g/dL — ABNORMAL LOW (ref 13.0–17.0)
Lymphocytes Relative: 21 % (ref 12–46)
Lymphs Abs: 1.6 10*3/uL (ref 0.7–4.0)
MCH: 26 pg (ref 26.0–34.0)
MCHC: 32.5 g/dL (ref 30.0–36.0)
MCV: 79.9 fL (ref 78.0–100.0)
Monocytes Absolute: 0.7 10*3/uL (ref 0.1–1.0)
Monocytes Relative: 9 % (ref 3–12)
Neutro Abs: 5 10*3/uL (ref 1.7–7.7)
Neutrophils Relative %: 64 % (ref 43–77)
Platelets: 325 10*3/uL (ref 150–400)
RBC: 4.12 MIL/uL — ABNORMAL LOW (ref 4.22–5.81)
RDW: 13.1 % (ref 11.5–15.5)
WBC: 7.9 10*3/uL (ref 4.0–10.5)

## 2014-05-31 LAB — TROPONIN I
Troponin I: 0.03 ng/mL (ref <0.031)
Troponin I: 0.03 ng/mL (ref <0.031)
Troponin I: <0.03 ng/mL (ref <0.031)

## 2014-05-31 LAB — CBC
HCT: 32.5 % — ABNORMAL LOW (ref 39.0–52.0)
Hemoglobin: 10.6 g/dL — ABNORMAL LOW (ref 13.0–17.0)
MCH: 26 pg (ref 26.0–34.0)
MCHC: 32.6 g/dL (ref 30.0–36.0)
MCV: 79.9 fL (ref 78.0–100.0)
Platelets: 340 10*3/uL (ref 150–400)
RBC: 4.07 MIL/uL — ABNORMAL LOW (ref 4.22–5.81)
RDW: 13 % (ref 11.5–15.5)
WBC: 8.2 10*3/uL (ref 4.0–10.5)

## 2014-05-31 LAB — GLUCOSE, CAPILLARY: Glucose-Capillary: 89 mg/dL (ref 70–99)

## 2014-05-31 LAB — STREP PNEUMONIAE URINARY ANTIGEN: Strep Pneumo Urinary Antigen: NEGATIVE

## 2014-05-31 LAB — HEPARIN LEVEL (UNFRACTIONATED): Heparin Unfractionated: <0.1 IU/mL — ABNORMAL LOW (ref 0.30–0.70)

## 2014-05-31 MED ORDER — ENOXAPARIN SODIUM 60 MG/0.6ML ~~LOC~~ SOLN
60.0000 mg | SUBCUTANEOUS | Status: DC
  Administered 2014-05-31: 60 mg via SUBCUTANEOUS
  Filled 2014-05-31 (×2): qty 0.6

## 2014-05-31 MED ORDER — ASPIRIN 81 MG PO CHEW
243.0000 mg | CHEWABLE_TABLET | Freq: Once | ORAL | Status: AC
Start: 2014-05-31 — End: 2014-05-31
  Administered 2014-05-31: 243 mg via ORAL
  Filled 2014-05-31: qty 3

## 2014-05-31 MED ORDER — HEPARIN BOLUS VIA INFUSION
3000.0000 [IU] | Freq: Once | INTRAVENOUS | Status: AC
  Administered 2014-05-31: 3000 [IU] via INTRAVENOUS
  Filled 2014-05-31: qty 3000

## 2014-05-31 MED ORDER — ASPIRIN EC 325 MG PO TBEC
325.0000 mg | DELAYED_RELEASE_TABLET | Freq: Every day | ORAL | Status: DC
  Administered 2014-05-31 – 2014-06-05 (×6): 325 mg via ORAL
  Filled 2014-05-31 (×6): qty 1

## 2014-05-31 MED ORDER — IOHEXOL 350 MG/ML SOLN
100.0000 mL | Freq: Once | INTRAVENOUS | Status: AC | PRN
  Administered 2014-05-31: 100 mL via INTRAVENOUS

## 2014-05-31 MED ORDER — SODIUM CHLORIDE 0.9 % IJ SOLN
3.0000 mL | Freq: Two times a day (BID) | INTRAMUSCULAR | Status: DC
  Administered 2014-05-31 – 2014-06-09 (×10): 3 mL via INTRAVENOUS

## 2014-05-31 MED ORDER — ONDANSETRON HCL 4 MG PO TABS: 4.0000 mg | ORAL_TABLET | Freq: Four times a day (QID) | ORAL | Status: DC | PRN

## 2014-05-31 MED ORDER — ROPINIROLE HCL 0.25 MG PO TABS
0.2500 mg | ORAL_TABLET | Freq: Every day | ORAL | Status: DC
  Administered 2014-05-31 – 2014-06-08 (×9): 0.25 mg via ORAL
  Filled 2014-05-31 (×10): qty 1

## 2014-05-31 MED ORDER — PANTOPRAZOLE SODIUM 40 MG PO TBEC
40.0000 mg | DELAYED_RELEASE_TABLET | Freq: Every day | ORAL | Status: DC
  Administered 2014-05-31 – 2014-06-09 (×10): 40 mg via ORAL
  Filled 2014-05-31 (×10): qty 1

## 2014-05-31 MED ORDER — ONDANSETRON HCL 4 MG/2ML IJ SOLN
4.0000 mg | Freq: Four times a day (QID) | INTRAMUSCULAR | Status: DC | PRN
Start: 2014-05-31 — End: 2014-06-08

## 2014-05-31 MED ORDER — ACETAMINOPHEN 325 MG PO TABS
650.0000 mg | ORAL_TABLET | Freq: Once | ORAL | Status: AC
  Administered 2014-05-31: 650 mg via ORAL
  Filled 2014-05-31: qty 2

## 2014-05-31 MED ORDER — ACETAMINOPHEN 325 MG PO TABS
650.0000 mg | ORAL_TABLET | Freq: Four times a day (QID) | ORAL | Status: DC | PRN
  Administered 2014-05-31 – 2014-06-07 (×4): 650 mg via ORAL
  Filled 2014-05-31 (×4): qty 2

## 2014-05-31 MED ORDER — LEVOFLOXACIN IN D5W 750 MG/150ML IV SOLN
750.0000 mg | INTRAVENOUS | Status: DC
  Administered 2014-05-31 – 2014-06-02 (×3): 750 mg via INTRAVENOUS
  Filled 2014-05-31 (×4): qty 150

## 2014-05-31 MED ORDER — ACETAMINOPHEN 650 MG RE SUPP: 650.0000 mg | Freq: Four times a day (QID) | RECTAL | Status: DC | PRN

## 2014-05-31 MED ORDER — SODIUM CHLORIDE 0.9 % IJ SOLN
3.0000 mL | Freq: Two times a day (BID) | INTRAMUSCULAR | Status: DC
  Administered 2014-05-31 – 2014-06-06 (×10): 3 mL via INTRAVENOUS

## 2014-05-31 MED ORDER — METOPROLOL TARTRATE 12.5 MG HALF TABLET
12.5000 mg | ORAL_TABLET | Freq: Two times a day (BID) | ORAL | Status: DC
  Administered 2014-05-31 – 2014-06-09 (×15): 12.5 mg via ORAL
  Filled 2014-05-31 (×19): qty 1

## 2014-05-31 MED ORDER — LEVOFLOXACIN IN D5W 750 MG/150ML IV SOLN
750.0000 mg | Freq: Once | INTRAVENOUS | Status: AC
  Administered 2014-05-31: 750 mg via INTRAVENOUS
  Filled 2014-05-31: qty 150

## 2014-05-31 MED ORDER — LOSARTAN POTASSIUM 50 MG PO TABS
50.0000 mg | ORAL_TABLET | Freq: Every day | ORAL | Status: DC
  Administered 2014-05-31 – 2014-06-09 (×10): 50 mg via ORAL
  Filled 2014-05-31 (×10): qty 1

## 2014-05-31 NOTE — H&P (Signed)
[facility] History and Physical  Colin Lee MWN:[id number] DOB: Apr 08, 1961 DOA: 05/30/2014  Referring physician: ER physician. PCP: Bronson Curb, PA-C   Chief Complaint: Chest pain and shortness of breath.  HPI: Colin Lee is a 54 y.o. male with history of recurrent pleural effusion who has had VATS in October last year, hypertension presents the ER because of chest pain and shortness of breath. Patient also has been having subjective feeling of fever and chills. Patient states patient has been having exertional chest pain over the last month and a half and has had a stress test and 10 days ago which was unremarkable. Patient states chest. Pain progressively worsening and decided to come to the ER. Patient's chest pain improved without any intervention. EKG was showing sinus tachycardia with elevated d-dimer and troponin. CT and exam of the chest was negative for PE but did show pneumonia and right-sided pleural effusion. Patient has been started on empiric antibiotics and heparin infusion and admitted for further management. At this time patient is chest pain-free. Patient otherwise denies any nausea vomiting abdominal pain diarrhea.   Review of Systems: As presented in the history of presenting illness, rest negative.  Past Medical History  Diagnosis Date  . GERD (gastroesophageal reflux disease)   . Hypertension   . Shortness of breath     Starting May 2015  . Headache     stress and tension  . Neuromuscular disorder     restless legs and leg cramps  . Arthritis    Past Surgical History  Procedure Laterality Date  . No prior surgery    . Thoracentesis Left 2015  . Colonoscopy w/ biopsies and polypectomy  2014    benign  . Video assisted thoracoscopy Left 02/01/2014    Procedure: VIDEO ASSISTED THORACOSCOPY;  Surgeon: Grace Isaac, MD;  Location: [place];  Service: Thoracic;  Laterality: Left;  . Pleural effusion drainage Left 02/01/2014     Procedure: DRAINAGE OF PLEURAL EFFUSION;  Surgeon: Grace Isaac, MD;  Location: [place];  Service: Thoracic;  Laterality: Left;  . Video bronchoscopy N/A 02/01/2014    Procedure: VIDEO BRONCHOSCOPY;  Surgeon: Grace Isaac, MD;  Location: [place];  Service: Thoracic;  Laterality: N/A;  . Pleural biopsy Left 02/01/2014    Procedure: PLEURAL BIOPSY;  Surgeon: Grace Isaac, MD;  Location: [place];  Service: Thoracic;  Laterality: Left;  . Chest tube insertion Left 02/01/2014    Procedure: INSERTION PLEURAL DRAINAGE CATHETER;  Surgeon: Grace Isaac, MD;  Location: [place];  Service: Thoracic;  Laterality: Left;  . [place] pleurodesis Left 02/01/2014    Procedure: TALC PLEURADESIS;  Surgeon: Grace Isaac, MD;  Location: [place];  Service: Thoracic;  Laterality: Left;  . Removal of pleural drainage catheter Left 02/16/2014    Procedure: REMOVAL OF PLEURAL DRAINAGE CATHETER;  Surgeon: Grace Isaac, MD;  Location: [place];  Service: Thoracic;  Laterality: Left;   Social History:  reports that he quit smoking about 14 years ago. His smoking use included Cigarettes. He has a 30 pack-year smoking history. He has never used smokeless tobacco. He reports that he drinks alcohol. He reports that he does not use illicit drugs. Where does patient live home. Can patient participate in ADLs? Yes.  No Known Allergies  Family History:  Family History  Problem Relation Age of Onset  . Adopted: Yes      Prior to Admission medications   Medication Sig Start Date End  Date Taking? Authorizing Provider  albuterol (PROVENTIL HFA;VENTOLIN HFA) 108 (90 BASE) MCG/ACT inhaler Inhale 2 puffs into the lungs every 6 (six) hours as needed for wheezing or shortness of breath.   Yes Historical Provider, MD  aspirin EC 81 MG tablet Take 81 mg by mouth daily.   Yes Historical Provider, MD  ibuprofen (ADVIL,MOTRIN) 800 MG tablet Take 800 mg by mouth every 8 (eight) hours as needed for mild pain or moderate pain.     Yes Historical Provider, MD  losartan (COZAAR) 50 MG tablet Take 50 mg by mouth daily.   Yes Historical Provider, MD  Multiple Vitamins-Minerals (CENTRUM ADULTS PO) Take 1 tablet by mouth daily.   Yes Historical Provider, MD  omeprazole (PRILOSEC) 20 MG capsule Take 20 mg by mouth daily.   Yes Historical Provider, MD  polyethylene glycol (MIRALAX / GLYCOLAX) packet Take 17 g by mouth daily. Patient taking differently: Take 17 g by mouth daily as needed for mild constipation or moderate constipation.  10/30/13  Yes Samuella Cota, MD  rOPINIRole (REQUIP) 0.25 MG tablet Take 1 tablet (0.25 mg total) by mouth at bedtime. 10/11/13  Yes Kathie Dike, MD    Physical Exam: Filed Vitals:   05/31/14 0237 05/31/14 0245 05/31/14 0300 05/31/14 0315  BP: 133/76 136/76 131/58 116/66  Pulse: 92 87 89 88  Temp:      TempSrc:      Resp: 19 17 20 20   Height:      Weight:      SpO2: 97% 94% 93% 93%     General:  Well-developed and nourished.  Eyes: Anicteric no pallor.  ENT: No discharge from the ears eyes nose and mouth.  Neck: No mass felt.  Cardiovascular: S1 and S2 heard.  Respiratory: No rhonchi or crepitations.  Abdomen: Soft nontender bowel sounds present.  Skin: No rash.  Musculoskeletal: No edema.  Psychiatric: Appears normal.  Neurologic: Alert awake oriented to time place and person. Moves all extremities.  Labs on Admission:  Basic Metabolic Panel:  Recent Labs Lab 05/30/14 2005  NA 137  K 4.1  CL 103  CO2 27  GLUCOSE 107*  BUN 11  CREATININE 0.99  CALCIUM 8.7   Liver Function Tests: No results for input(s): AST, ALT, ALKPHOS, BILITOT, PROT, ALBUMIN in the last 168 hours. No results for input(s): LIPASE, AMYLASE in the last 168 hours. No results for input(s): AMMONIA in the last 168 hours. CBC:  Recent Labs Lab 05/30/14 2005  WBC 10.7*  NEUTROABS 7.8*  HGB 11.1*  HCT 33.8*  MCV 81.8  PLT 396   Cardiac Enzymes:  Recent Labs Lab 05/30/14 2005   TROPONINI 0.04*    BNP (last 3 results)  Recent Labs  05/30/14 2150  BNP 92.5    ProBNP (last 3 results)  Recent Labs  10/06/13 1942  PROBNP 79.8    CBG: No results for input(s): GLUCAP in the last 168 hours.  Radiological Exams on Admission: Dg Chest 2 View  05/30/2014   CLINICAL DATA:  Left-sided chest pain and shortness of breath for 3 days. Multiple prior left thoracentesis he has over the past 5 years.  EXAM: CHEST  2 VIEW  COMPARISON:  05/03/2014  FINDINGS: Mild cardiac enlargement with increased pulmonary vascularity. No edema or consolidation. Small bilateral pleural effusions with basilar atelectasis. Appearance is similar to prior study. No pneumothorax. Mediastinal contours appear intact.  IMPRESSION: Bilateral pleural effusions with basilar atelectasis. Mild cardiac enlargement and pulmonary vascular congestion.  Electronically Signed   By: Lucienne Capers M.D.   On: 05/30/2014 20:53   Ct Angio Chest Pe W/cm &/or Wo Cm  05/31/2014   CLINICAL DATA:  Chest pain and shortness of breath beginning mid January, worsening since Saturday.  EXAM: CT ANGIOGRAPHY CHEST WITH CONTRAST  TECHNIQUE: Multidetector CT imaging of the chest was performed using the standard protocol during bolus administration of intravenous contrast. Multiplanar CT image reconstructions and MIPs were obtained to evaluate the vascular anatomy.  CONTRAST:  122mL OMNIPAQUE IOHEXOL 350 MG/ML SOLN  COMPARISON:  Chest radiograph May 30, 2014 at 2010 hours and CT of the chest December 27, 2013  FINDINGS: Adequate pulmonary arterial contrast opacification. Main pulmonary artery is not enlarged. No pulmonary embolism to the level of the subsegmental branches though mild respiratory motion degrades sensitivity.  Heart size is mildly enlarged, no pericardial fluid collections. Thoracic aorta is normal in course and caliber with mild calcific atherosclerosis. RIGHT hilar lymphadenopathy measures up to 12 mm short  axis. Pre treat tracheal 13 mm short axis lymph node, additional smaller pre VATS vascular and aortopulmonary window lymph nodes.  Moderate RIGHT pleural effusion, small LEFT pleural effusion and thickening. Pulmonary vascular congestion. Patchy ground-glass opacities and interstitial prominence in LEFT upper lobe, axial 39/90. Bilateral lower lobe atelectasis. Patchy enhancing LEFT lower lobe airspace opacity. Tracheobronchial tree is patent and midline. No pneumothorax.  Multiple subcentimeter gallstones without CT findings of acute cholecystitis. 2.5 cm cyst in RIGHT lobe of the liver, liver is otherwise unremarkable.  Review of the MIP images confirms the above findings.  IMPRESSION: No acute pulmonary embolism on this mild respiratory motion degraded examination.  Mild cardiomegaly and pulmonary vascular congestion. Moderate RIGHT pleural effusion. Patchy LEFT upper lobe airspace opacity could reflect confluent edema though, pneumonia is a concern. Patchy enhancing atelectasis/ pneumonia LEFT lower lobe. Recommend followup imaging after treatment to verify improvement.  Mediastinal lymphadenopathy may be reactive though, recommend close attention on follow-up imaging.   Electronically Signed   By: Elon Alas   On: 05/31/2014 01:03    EKG: Independently reviewed. Sinus tachycardia.  Assessment/Plan Principal Problem:   Chest pain Active Problems:   Pleural effusion   Hypertension   CAP (community acquired pneumonia)   1. Chest pain with elevated troponins - patient is chest pain-free at this time. Continue heparin infusion and I have added low-dose metoprolol. Cycle cardiac markers. Keep patient nothing by mouth in anticipation of possible cardiac procedure. Patient has had a recent stress test done which was unremarkable. Patient is on aspirin. Keep patient on when necessary nitroglycerin. 2. Pneumonia - patient has been placed on empiric antibiotics. 3. Pleural effusion with history of  recurrent pleural effusion and history of VATS - will need further input from patient's cardiothoracic surgeon Dr. [name]. Please consult Dr. [name] in a.m. 4. Chronic anemia - follow CBC for any further worsening. Patient states he has had a colonoscopy last 2 years which was unremarkable. 5. Hypertension - continue home medication. I have also added beta blockers secondary to #1.   DVT Prophylaxis heparin infusion.  Code Status: Full code.  Family Communication: None.  Disposition Plan: Admit to inpatient.    Swan Zayed N. [facility] Pager [phone].  If 7PM-7AM, please contact night-coverage [email or web address] Password [facility] 05/31/2014, 3:39 AM

## 2014-05-31 NOTE — Consult Note (Signed)
[address]       [place] [address]             [phone]        [facility] Medical Record #[id number] Date of Birth: March 29, 1961  Referring: No ref. provider found Primary Care: [facility]  Chief Complaint:    Chief Complaint  Patient presents with  . Chest Pain  . Shortness of Breath     History of Present Illness:      The patient was admitted early this morning with a complaint of chest pain and shortness of breath. He describes the pain as  present in the center of the chest and mostly occurs when he exerts himself. He has starting having low grade temps at night as he did last summer    He has been admitted to the hospital twice in the last 6 months with shortness of breath and fever. He has been treated for community acquired pneumonia and has undergone 3 thoracenteses for a left parapneumonic effusion.02/02/2015 left VATS  Done ,Fluid cytology from the initial episode showed some reactive mesothelial cells, but more recent cytologies showed inflammation, and all cultures have been negative. Echocardiogram showed normal EF with no other abnormalities. ANA and c-ANA were positive and titers were elevated. The patient had  rheumatology evaluation on 11/19.   When  I last saw him in the office he described chest pain while shoveling snow   and was recommended to see cardiology. He followed up with Dr Radford Pax and underwent a Myoview stress test which was found to be normal. He is currently not having any chest pain and again tells me it is mostly when he exerts himself and lives heavy items.    DATE OF PROCEDURE: 02/01/2014 OPERATIVE REPORT PREOPERATIVE DIAGNOSIS: Recurrent left pleural effusion. POSTOPERATIVE DIAGNOSIS: Recurrent left pleural effusion. Final pathology pending. PROCEDURE PERFORMED: Bronchoscopy, left video-assisted thoracoscopy, drainage of left pleural effusion, pleural biopsies, talc  pleurodesis, placement of chest tube and PleurX catheter. SURGEON: Lanelle Bal, MD  Current Activity/ Functional Status: Patient is independent with mobility/ambulation, transfers, ADL's, IADL's.   Zubrod Score: At the time of surgery this patient's most appropriate activity status/level should be described as: []     0    Normal activity, no symptoms [x]     1    Restricted in physical strenuous activity but ambulatory, able to do out light work []     2    Ambulatory and capable of self care, unable to do work activities, up and about                 more than 50%  Of the time                            []     3    Only limited self care, in bed greater than 50% of waking hours []     4    Completely disabled, no self care, confined to bed or chair []     5    Moribund  Past Medical History  Diagnosis Date  . GERD (gastroesophageal reflux disease)   . Hypertension   . Shortness of breath     Starting May 2015  . Headache     stress and tension  . Neuromuscular disorder     restless legs and leg cramps  . Arthritis  Past Surgical History  Procedure Laterality Date  . No prior surgery    . Thoracentesis Left 2015  . Colonoscopy w/ biopsies and polypectomy  2014    benign  . Video assisted thoracoscopy Left 02/01/2014    Procedure: VIDEO ASSISTED THORACOSCOPY;  Surgeon: Grace Isaac, MD;  Location: [facility];  Service: Thoracic;  Laterality: Left;  . Pleural effusion drainage Left 02/01/2014    Procedure: DRAINAGE OF PLEURAL EFFUSION;  Surgeon: Grace Isaac, MD;  Location: [facility];  Service: Thoracic;  Laterality: Left;  . Video bronchoscopy N/A 02/01/2014    Procedure: VIDEO BRONCHOSCOPY;  Surgeon: Grace Isaac, MD;  Location: [facility];  Service: Thoracic;  Laterality: N/A;  . Pleural biopsy Left 02/01/2014    Procedure: PLEURAL BIOPSY;  Surgeon: Grace Isaac, MD;  Location: [facility];  Service: Thoracic;  Laterality: Left;  . Chest tube insertion Left 02/01/2014      Procedure: INSERTION PLEURAL DRAINAGE CATHETER;  Surgeon: Grace Isaac, MD;  Location: [facility];  Service: Thoracic;  Laterality: Left;  . Talc pleurodesis Left 02/01/2014    Procedure: TALC PLEURADESIS;  Surgeon: Grace Isaac, MD;  Location: [facility];  Service: Thoracic;  Laterality: Left;  . Removal of pleural drainage catheter Left 02/16/2014    Procedure: REMOVAL OF PLEURAL DRAINAGE CATHETER;  Surgeon: Grace Isaac, MD;  Location: [facility];  Service: Thoracic;  Laterality: Left;    History  Smoking status  . Former Smoker -- 1.00 packs/day for 30 years  . Types: Cigarettes  . Quit date: 04/08/2000  Smokeless tobacco  . Never Used   History  Alcohol Use  . Yes    Comment: rare    History   Social History  . Marital Status: Married    Spouse Name: N/A  . Number of Children: N/A  . Years of Education: N/A   Occupational History  . Not on file.   Social History Main Topics  . Smoking status: Former Smoker -- 1.00 packs/day for 30 years    Types: Cigarettes    Quit date: 04/08/2000  . Smokeless tobacco: Never Used  . Alcohol Use: Yes     Comment: rare  . Drug Use: No  . Sexual Activity: Not on file   Other Topics Concern  . Not on file   Social History Narrative    No Known Allergies  Current Facility-Administered Medications  Medication Dose Route Frequency Provider Last Rate Last Dose  . acetaminophen (TYLENOL) tablet 650 mg  650 mg Oral Q6H PRN Rise Patience, MD       Or  . acetaminophen (TYLENOL) suppository 650 mg  650 mg Rectal Q6H PRN Rise Patience, MD      . aspirin EC tablet 325 mg  325 mg Oral Daily Rise Patience, MD   325 mg at 05/31/14 6378  . enoxaparin (LOVENOX) injection 60 mg  60 mg Subcutaneous Q24H Rande Lawman Rumbarger, RPH   60 mg at 05/31/14 1229  . levofloxacin (LEVAQUIN) IVPB 750 mg  750 mg Intravenous Q24H Saima Rizwan, MD      . losartan (COZAAR) tablet 50 mg  50 mg Oral Daily Rise Patience, MD   50 mg  at 05/31/14 0945  . metoprolol tartrate (LOPRESSOR) tablet 12.5 mg  12.5 mg Oral BID Rise Patience, MD   12.5 mg at 05/31/14 0945  . ondansetron (ZOFRAN) tablet 4 mg  4 mg Oral Q6H PRN Rise Patience, MD  Or  . ondansetron (ZOFRAN) injection 4 mg  4 mg Intravenous Q6H PRN Rise Patience, MD      . pantoprazole (PROTONIX) EC tablet 40 mg  40 mg Oral Daily Rise Patience, MD   40 mg at 05/31/14 0946  . rOPINIRole (REQUIP) tablet 0.25 mg  0.25 mg Oral QHS Rise Patience, MD      . sodium chloride 0.9 % injection 3 mL  3 mL Intravenous Q12H Rise Patience, MD   3 mL at 05/31/14 0949  . sodium chloride 0.9 % injection 3 mL  3 mL Intravenous Q12H Rise Patience, MD   3 mL at 05/31/14 4431   Current Outpatient Prescriptions  Medication Sig Dispense Refill  . albuterol (PROVENTIL HFA;VENTOLIN HFA) 108 (90 BASE) MCG/ACT inhaler Inhale 2 puffs into the lungs every 6 (six) hours as needed for wheezing or shortness of breath.    Marland Kitchen aspirin EC 81 MG tablet Take 81 mg by mouth daily.    Marland Kitchen ibuprofen (ADVIL,MOTRIN) 800 MG tablet Take 800 mg by mouth every 8 (eight) hours as needed for mild pain or moderate pain.     Marland Kitchen losartan (COZAAR) 50 MG tablet Take 50 mg by mouth daily.    . Multiple Vitamins-Minerals (CENTRUM ADULTS PO) Take 1 tablet by mouth daily.    Marland Kitchen omeprazole (PRILOSEC) 20 MG capsule Take 20 mg by mouth daily.    . polyethylene glycol (MIRALAX / GLYCOLAX) packet Take 17 g by mouth daily. (Patient taking differently: Take 17 g by mouth daily as needed for mild constipation or moderate constipation. ) 30 each 0  . rOPINIRole (REQUIP) 0.25 MG tablet Take 1 tablet (0.25 mg total) by mouth at bedtime. 30 tablet 0     (Not in a hospital admission)  Family History  Problem Relation Age of Onset  . Adopted: Yes     Review of Systems:      Cardiac Review of Systems: Y or N  Chest Pain [ y   ]  Resting SOB [  n ] Exertional SOB  [  y]  Orthopnea [ n ]     Pedal Edema [  n ]    Palpitations [ n ] Syncope  [  n]   Presyncope [n   ]  General Review of Systems: [Y] = yes [  ]=no Constitional: recent weight change [n  ]; anorexia [  ]; fatigue [  ]; nausea [  ]; night sweats [ y ]; fever [ y ]; or chills [ n ]                                                               Dental: poor dentition[  ]; Last Dentist visit:   Eye : blurred vision [n  ]; diplopia [   ]; vision changes [n  ];  Amaurosis fugax[  n]; Resp: cough [ n ];  wheezing[n  ];  hemoptysis[n  ]; shortness of breath[  ]; paroxysmal nocturnal dyspnea[  ]; dyspnea on exertion[  y]; or orthopnea[  ];  GI:  gallstones[  ], vomiting[  ];  dysphagia[  ]; melena[  ];  hematochezia [  ]; heartburn[  ];   Hx of  Colonoscopy[  ]; GU: kidney stones [  ];  hematuria[ n ];   dysuria [  ];  nocturia[  ];  history of     obstruction [  ]; urinary frequency [ n ]             Skin: rash, swelling[  ];, hair loss[  ];  peripheral edema[  ];  or itching[  ]; Musculosketetal: myalgias[  ];  joint swelling[  ];  joint erythema[n  ];  joint pain[  ];  back pain[ n ];  Heme/Lymph: bruising[  ];  bleeding[  ];  anemia[  ];  Neuro: TIA[  ];  headaches[  ];  stroke[n  ];  vertigo[  ];  seizures[  ];   paresthesias[  ];  difficulty walking[  ];  Psych:depression[  ]; anxiety[  ];  Endocrine: diabetes[  ];  thyroid dysfunction[  ];  Immunizations: Flu Jazmín.Cullens  ]; Pneumococcal[Y  ];  Other:  Physical Exam: BP 125/79 mmHg  Pulse 83  Temp(Src) 99 F (37.2 C) (Oral)  Resp 22  Ht 5\' 10"  (1.778 m)  Wt 275 lb (124.739 kg)  BMI 39.46 kg/m2  SpO2 94%   General appearance: alert, cooperative, appears stated age and no distress Head: Normocephalic, without obvious abnormality, atraumatic Neck: no adenopathy, no carotid bruit, no JVD, supple, symmetrical, trachea midline and thyroid not enlarged, symmetric, no tenderness/mass/nodules Lymph nodes: Cervical, supraclavicular, and axillary nodes normal. Resp: diminished  breath sounds bibasilar Back: symmetric, no curvature. ROM normal. No CVA tenderness. Cardio: regular rate and rhythm, S1, S2 normal, no murmur, click, rub or gallop GI: soft, non-tender; bowel sounds normal; no masses,  no organomegaly Extremities: extremities normal, atraumatic, no cyanosis or edema and Homans sign is negative, no sign of DVT Neurologic: Alert and oriented X 3, normal strength and tone. Normal symmetric reflexes. Normal coordination and gait  Diagnostic Studies & Laboratory data:     Recent Radiology Findings:   Dg Chest 2 View  05/30/2014   CLINICAL DATA:  Left-sided chest pain and shortness of breath for 3 days. Multiple prior left thoracentesis he has over the past 5 years.  EXAM: CHEST  2 VIEW  COMPARISON:  05/03/2014  FINDINGS: Mild cardiac enlargement with increased pulmonary vascularity. No edema or consolidation. Small bilateral pleural effusions with basilar atelectasis. Appearance is similar to prior study. No pneumothorax. Mediastinal contours appear intact.  IMPRESSION: Bilateral pleural effusions with basilar atelectasis. Mild cardiac enlargement and pulmonary vascular congestion.   Electronically Signed   By: Lucienne Capers M.D.   On: 05/30/2014 20:53   Ct Angio Chest Pe W/cm &/or Wo Cm  05/31/2014   CLINICAL DATA:  Chest pain and shortness of breath beginning mid January, worsening since Saturday.  EXAM: CT ANGIOGRAPHY CHEST WITH CONTRAST  TECHNIQUE: Multidetector CT imaging of the chest was performed using the standard protocol during bolus administration of intravenous contrast. Multiplanar CT image reconstructions and MIPs were obtained to evaluate the vascular anatomy.  CONTRAST:  121mL OMNIPAQUE IOHEXOL 350 MG/ML SOLN  COMPARISON:  Chest radiograph May 30, 2014 at 2010 hours and CT of the chest December 27, 2013  FINDINGS: Adequate pulmonary arterial contrast opacification. Main pulmonary artery is not enlarged. No pulmonary embolism to the level of the  subsegmental branches though mild respiratory motion degrades sensitivity.  Heart size is mildly enlarged, no pericardial fluid collections. Thoracic aorta is normal in course and caliber with mild calcific atherosclerosis. RIGHT hilar lymphadenopathy measures up to 12 mm short axis. Pre treat tracheal 13 mm short axis lymph  node, additional smaller pre VATS vascular and aortopulmonary window lymph nodes.  Moderate RIGHT pleural effusion, small LEFT pleural effusion and thickening. Pulmonary vascular congestion. Patchy ground-glass opacities and interstitial prominence in LEFT upper lobe, axial 39/90. Bilateral lower lobe atelectasis. Patchy enhancing LEFT lower lobe airspace opacity. Tracheobronchial tree is patent and midline. No pneumothorax.  Multiple subcentimeter gallstones without CT findings of acute cholecystitis. 2.5 cm cyst in RIGHT lobe of the liver, liver is otherwise unremarkable.  Review of the MIP images confirms the above findings.  IMPRESSION: No acute pulmonary embolism on this mild respiratory motion degraded examination.  Mild cardiomegaly and pulmonary vascular congestion. Moderate RIGHT pleural effusion. Patchy LEFT upper lobe airspace opacity could reflect confluent edema though, pneumonia is a concern. Patchy enhancing atelectasis/ pneumonia LEFT lower lobe. Recommend followup imaging after treatment to verify improvement.  Mediastinal lymphadenopathy may be reactive though, recommend close attention on follow-up imaging.   Electronically Signed   By: Elon Alas   On: 05/31/2014 01:03     I have independently reviewed the above radiologic studies.  Recent Lab Findings: Lab Results  Component Value Date   WBC 7.9 05/31/2014   HGB 10.7* 05/31/2014   HCT 32.9* 05/31/2014   PLT 325 05/31/2014   GLUCOSE 87 05/31/2014   CHOL 145 12/29/2013   TRIG 129 12/29/2013   HDL 25* 12/29/2013   LDLCALC 94 12/29/2013   ALT 37 05/31/2014   AST 29 05/31/2014   NA 138 05/31/2014   K  3.9 05/31/2014   CL 104 05/31/2014   CREATININE 0.91 05/31/2014   BUN 11 05/31/2014   CO2 25 05/31/2014   TSH 1.410 12/27/2013   INR 1.03 01/31/2014   HGBA1C 6.1* 10/06/2013      Assessment / Plan:    Episodic Fever of unknown cause intermittently for greater then 6 months- with recurrent effusions  Rheumatologic workup  Started  but full details are unknown Left chest appears as expected without recurrent effusion, small effusion on the right, unclear if patient has pneumonia , nl WBC, Can consider ct or US guided thoracentesis to obtain fluid to test and culture- does not need chest tube at this point  Chest pain with exertion- recent negative cardiology wu with Myoview echo- ask cardiology to see again.     I  spent 40 minutes counseling the patient face to face and 50% or more the  time was spent in counseling and coordination of care. The total time spent in the appointment was 60 minutes.    Grace Isaac MD      [address] [place] [address] Office [phone]   Beeper [phone]  05/31/2014 12:48 PM

## 2014-05-31 NOTE — ED Provider Notes (Signed)
CT angiogram with moderate right pleural effusion. Left upper lobe infiltrate. Patient states he's had subjective fevers and chills as well as cough over the last few days. Febrile in the emergency department. Mild elevation in white blood cell count. Patient also has had sharp chest pain mostly on the right with deep inspiration. Questionably due to pleural effusion. Mild elevation in troponin. Discussed with [facility] hospitalist. We'll admit for pneumonia and trend troponins.  Julianne Rice, MD 05/31/14 [phone]

## 2014-05-31 NOTE — Progress Notes (Addendum)
[facility] hospitalist follow-up note  The patient was admitted early this morning with a complaint of chest pain and shortness of breath. He tells me that his pain was present in the center of the chest and mostly occurs when he exerts himself. He discussed this with Dr. [name] last month and was recommended to see cardiology. He followed up with [facility] and underwent a Myoview stress test which was found to be normal. He is currently not having any chest pain and again tells me it is mostly when he exerts himself and lives heavy items.  Principal Problem:   Chest pain -Etiology for this is difficult to ascertain-as mentioned above cardiac workup including an echo and stress test were normal -At this time he does have pulmonary issues which we will be working on-if chest pain does not resolve once his pulmonary issues have resolved, would recommend re-consulting cardiology with considerations for cardiac cath -We'll continue daily aspirin for now  Active Problems:   Pleural effusion -He underwent a talc pleurodesis of left-sided effusion in October 2015 after multiple recurrences after drainage  -Now has a right-sided effusion which along with fevers and pneumonia is suspicious for parapneumonic effusion versus empyema -I have consult at CT surgery to help decide whether he needs a chest tube -As mentioned above the patient had a normal 2-D echo on 2/18 and therefore effusion is likely not secondary to heart failure -Did have a rheumatology eval as outpatient as well and no cause for his effusion was determined -There was a question of whether he may have had asbestos exposure in the past which could be leading to the effusion     CAP (community acquired pneumonia) -Patient admits to having a cough for 2 weeks-also states that he is been having subjective fevers for about 3 weeks -CT scan reveals that he has pneumonia-he has been placed on Levaquin    Hypertension -Continue  metoprolol and ARB   Debbe Odea, MD Pager: [email or web address]

## 2014-05-31 NOTE — Progress Notes (Addendum)
ANTICOAGULATION CONSULT NOTE - Follow-up  Pharmacy Consult for Heparin Indication: chest pain/ACS  No Known Allergies  Patient Measurements: Height: 5\' 10"  (177.8 cm) Weight: 275 lb (124.739 kg) IBW/kg (Calculated) : 73 Heparin Dosing Weight: 101 kg  Vital Signs: Temp: 99 F (37.2 C) (02/23 0210) Temp Source: Oral (02/22 2225) BP: 124/63 mmHg (02/23 0830) Pulse Rate: 94 (02/23 0830)  Labs:  Recent Labs  05/30/14 2005 05/31/14 0618 05/31/14 0619  HGB 11.1* 10.6* 10.7*  HCT 33.8* 32.5* 32.9*  PLT 396 340 325  HEPARINUNFRC  --   --  <0.10*  CREATININE 0.99  --   --   TROPONINI 0.04*  --   --     Estimated Creatinine Clearance: 117 mL/min (by C-G formula based on Cr of 0.99).  Infusions:  . heparin 1,300 Units/hr (05/30/14 2141)  . heparin    . levofloxacin (LEVAQUIN) IV      Assessment: 54yo male with history of HTN, GERD, and arthritis presents with SOB and chest pain. He continues on IV heparin. Initial heparin level is undetectable. No problems infusing per RN. No bleeding noted. H/H is low but stable and platelets are WNL.   Goal of Therapy:  Heparin level 0.3-0.7 units/ml Monitor platelets by anticoagulation protocol: Yes   Plan:  1. Heparin bolus 3000 units IV x 1 2. Increase heparin gtt to 1600 units/hr 3. Check a 6 hour heparin level 4. Daily heparin level and CBC  Salome Arnt, PharmD, BCPS Pager # [phone] 05/31/2014 8:50 AM  Addendum: Changing to lovenox prophylactic dosing.  Plan: 1. Lovenox 60mg  SQ Q24H - will sign-off consult 2. DC heparin and heparin labs  Salome Arnt, PharmD, BCPS Pager # [phone] 05/31/2014 9:43 AM

## 2014-05-31 NOTE — ED Notes (Signed)
Patient transported to CT 

## 2014-06-01 ENCOUNTER — Inpatient Hospital Stay (HOSPITAL_COMMUNITY): Payer: BC Managed Care – PPO

## 2014-06-01 ENCOUNTER — Other Ambulatory Visit: Payer: Self-pay

## 2014-06-01 ENCOUNTER — Telehealth: Payer: Self-pay

## 2014-06-01 DIAGNOSIS — R079 Chest pain, unspecified: Secondary | ICD-10-CM

## 2014-06-01 DIAGNOSIS — R0600 Dyspnea, unspecified: Secondary | ICD-10-CM

## 2014-06-01 LAB — CBC
HCT: 33.7 % — ABNORMAL LOW (ref 39.0–52.0)
Hemoglobin: 11.1 g/dL — ABNORMAL LOW (ref 13.0–17.0)
MCH: 26.2 pg (ref 26.0–34.0)
MCHC: 32.9 g/dL (ref 30.0–36.0)
MCV: 79.7 fL (ref 78.0–100.0)
Platelets: 396 10*3/uL (ref 150–400)
RBC: 4.23 MIL/uL (ref 4.22–5.81)
RDW: 12.9 % (ref 11.5–15.5)
WBC: 7.4 10*3/uL (ref 4.0–10.5)

## 2014-06-01 LAB — LEGIONELLA ANTIGEN, URINE

## 2014-06-01 LAB — EXPECTORATED SPUTUM ASSESSMENT W REFEX TO RESP CULTURE

## 2014-06-01 LAB — BODY FLUID CELL COUNT WITH DIFFERENTIAL
Eos, Fluid: 3 %
Lymphs, Fluid: 88 %
Monocyte-Macrophage-Serous Fluid: 7 % — ABNORMAL LOW (ref 50–90)
Neutrophil Count, Fluid: 2 % (ref 0–25)
Total Nucleated Cell Count, Fluid: 3778 cu mm — ABNORMAL HIGH (ref 0–1000)

## 2014-06-01 LAB — LACTATE DEHYDROGENASE, PLEURAL OR PERITONEAL FLUID: LD, Fluid: 1083 U/L — ABNORMAL HIGH (ref 3–23)

## 2014-06-01 LAB — PROTEIN, BODY FLUID: Total protein, fluid: 5.3 g/dL

## 2014-06-01 LAB — GLUCOSE, SEROUS FLUID: Glucose, Fluid: 93 mg/dL

## 2014-06-01 LAB — AMYLASE, PLEURAL FLUID: Amylase, Pleural Fluid: 23 U/L

## 2014-06-01 LAB — GLUCOSE, CAPILLARY
Glucose-Capillary: 100 mg/dL — ABNORMAL HIGH (ref 70–99)
Glucose-Capillary: 105 mg/dL — ABNORMAL HIGH (ref 70–99)
Glucose-Capillary: 101 mg/dL — ABNORMAL HIGH (ref 70–99)
Glucose-Capillary: 100 mg/dL — ABNORMAL HIGH (ref 70–99)

## 2014-06-01 MED ORDER — HYDROCOD POLST-CHLORPHEN POLST 10-8 MG/5ML PO LQCR
5.0000 mL | Freq: Two times a day (BID) | ORAL | Status: DC | PRN
  Administered 2014-06-01 – 2014-06-08 (×7): 5 mL via ORAL
  Filled 2014-06-01 (×8): qty 5

## 2014-06-01 MED ORDER — METOPROLOL TARTRATE 25 MG PO TABS
25.0000 mg | ORAL_TABLET | Freq: Two times a day (BID) | ORAL | Status: AC
  Administered 2014-06-01 – 2014-06-02 (×2): 25 mg via ORAL
  Filled 2014-06-01 (×2): qty 1

## 2014-06-01 MED ORDER — LIDOCAINE HCL (PF) 1 % IJ SOLN
INTRAMUSCULAR | Status: AC
  Filled 2014-06-01: qty 10

## 2014-06-01 NOTE — Progress Notes (Signed)
[facility] Progress Note   Colin Lee HFW:[id number] DOB: 05-Jan-1961 DOA: 05/30/2014 PCP: Bronson Curb, PA-C  Brief narrative: Colin Lee is a 54 y.o. male admitted with a complaint of chest pain and shortness of breath. He tells me that his pain was present in the center of the chest and mostly occurs when he exerts himself. He discussed this with Dr. [name] last month and was recommended to see cardiology. He followed up with [facility] medical group and underwent a Myoview stress test which was found to be normal. He is currently not having any chest pain and again tells me it is mostly when he exerts himself and lives heavy items.   Subjective: He is coughing up yellow colored sputum-  discussed that he will undergo a thoracentesis today  Assessment/Plan: Principal Problem:  Chest pain -Etiology for this is difficult to ascertain-as mentioned above cardiac workup including an echo and stress test were normal -His cardiologist has ordered a coronary CT which will be performed tomorrow -We'll continue daily aspirin for now  Active Problems:  Pleural effusion -He underwent a talc pleurodesis of left-sided effusion in October 2015 after multiple recurrences after drainage  -Now has a moderate right-sided effusion which along with fevers and pneumonia is suspicious for parapneumonic effusion versus empyema -I have consult at CT surgery to help decide whether he needs a chest tube -As mentioned above the patient had a normal 2-D echo on 2/18 and therefore effusion is likely not secondary to heart failure -Did have a rheumatology eval as outpatient as well and no cause for his effusion was determined -There was a question of whether he may have had asbestos exposure in the past which could be leading to the effusion - He has been evaluated by CT surgery and they have determined that rather than placing a chest tube, a thoracentesis should be performed  first- this has been done today- will follow-up on lab results-it is to be noted that there was only 240 mL of fluid which was blood-tinged    CAP (community acquired pneumonia) -Patient admits to having a cough for 2 weeks-also states that he is been having subjective fevers for about 3 weeks -CT scan reveals that he has pneumonia-he has been placed on Levaquin   Hypertension -Continue metoprolol and ARB    Code Status: Full code Family Communication:  Disposition Plan: Follow-up on pleural fluid results DVT prophylaxis: Lovenox  Consultants: CT surgery  Procedures:   Antibiotics: Anti-infectives    Start     Dose/Rate Route Frequency Ordered Stop   05/31/14 2200  levofloxacin (LEVAQUIN) IVPB 750 mg    Comments:  Levaquin 750 mg IV q24h for CrCl > 50 mL/min   750 mg 100 mL/hr over 90 Minutes Intravenous Every 24 hours 05/31/14 0530     05/31/14 0130  levofloxacin (LEVAQUIN) IVPB 750 mg     750 mg 100 mL/hr over 90 Minutes Intravenous  Once 05/31/14 0125 05/31/14 0335         Objective: Filed Weights   05/30/14 1949 05/31/14 2229  Weight: 124.739 kg (275 lb) 122.063 kg (269 lb 1.6 oz)    Intake/Output Summary (Last 24 hours) at 06/01/14 1224 Last data filed at 06/01/14 0277  Gross per 24 hour  Intake    483 ml  Output    400 ml  Net     83 ml     Vitals Filed Vitals:   06/01/14 1000 06/01/14 1100 06/01/14 1111 06/01/14  1117  BP: 140/80 138/74 146/83 124/93  Pulse: 93     Temp: 99 F (37.2 C)     TempSrc: Oral     Resp: 18     Height:      Weight:      SpO2: 98%       Exam: General: Awake alert oriented 3, No acute respiratory distress Lungs: Clear to auscultation bilaterally without wheezes or crackles Cardiovascular: Regular rate and rhythm without murmur gallop or rub normal S1 and S2 Abdomen: Nontender, nondistended, soft, bowel sounds positive, no rebound, no ascites, no appreciable mass Extremities: No significant cyanosis, clubbing, or  edema bilateral lower extremities  Data Reviewed: Basic Metabolic Panel:  Recent Labs Lab 05/30/14 2005 05/31/14 0619  NA 137 138  K 4.1 3.9  CL 103 104  CO2 27 25  GLUCOSE 107* 87  BUN 11 11  CREATININE 0.99 0.91  CALCIUM 8.7 8.4   Liver Function Tests:  Recent Labs Lab 05/31/14 0619  AST 29  ALT 37  ALKPHOS 67  BILITOT 0.7  PROT 6.9  ALBUMIN 2.8*   No results for input(s): LIPASE, AMYLASE in the last 168 hours. No results for input(s): AMMONIA in the last 168 hours. CBC:  Recent Labs Lab 05/30/14 2005 05/31/14 0618 05/31/14 0619 06/01/14 0605  WBC 10.7* 8.2 7.9 7.4  NEUTROABS 7.8*  --  5.0  --   HGB 11.1* 10.6* 10.7* 11.1*  HCT 33.8* 32.5* 32.9* 33.7*  MCV 81.8 79.9 79.9 79.7  PLT 396 340 325 396   Cardiac Enzymes:  Recent Labs Lab 05/30/14 2005 05/31/14 0619 05/31/14 1155  TROPONINI 0.04* 0.03  0.03 <0.03   BNP (last 3 results)  Recent Labs  05/30/14 2150  BNP 92.5    ProBNP (last 3 results)  Recent Labs  10/06/13 1942  PROBNP 79.8    CBG:  Recent Labs Lab 05/31/14 0646 05/31/14 1236 05/31/14 1709 06/01/14 0629 06/01/14 0756  GLUCAP 93 79 89 100* 105*    No results found for this or any previous visit (from the past 240 hour(s)).   Studies:  Recent x-ray studies have been reviewed in detail by the Attending Physician  Scheduled Meds:  Scheduled Meds: . aspirin EC  325 mg Oral Daily  . levofloxacin (LEVAQUIN) IV  750 mg Intravenous Q24H  . lidocaine (PF)      . losartan  50 mg Oral Daily  . metoprolol tartrate  12.5 mg Oral BID  . metoprolol tartrate  25 mg Oral BID  . pantoprazole  40 mg Oral Daily  . rOPINIRole  0.25 mg Oral QHS  . sodium chloride  3 mL Intravenous Q12H  . sodium chloride  3 mL Intravenous Q12H   Continuous Infusions:   Time spent on care of this patient: 35 minutes   I[place]on, MD 06/01/2014, 12:24 PM  LOS: 1 day   [facility] Office  [phone] Pager - Text Page per  [email or web address]  If 7PM-7AM, please contact night-coverage [email or web address]

## 2014-06-01 NOTE — Progress Notes (Signed)
UR Completed.  [phone]  

## 2014-06-01 NOTE — Telephone Encounter (Signed)
-----   Message from Sueanne Margarita, MD sent at 05/28/2014  4:16 PM EST ----- Please order a Coronary CTA with morphology and calcium score to assess further.  He will  Need a prescription to take Lopressor 25mg  the night before his CT and 25mg  the am of his CT

## 2014-06-01 NOTE — Procedures (Signed)
  US guided thora Rt  420 cc blood tinged fluid Sent for labs  cxr pending

## 2014-06-01 NOTE — Telephone Encounter (Signed)
Spoke with Marvel Plan, RN at [facility] about ordering coronary CT in-patient.   Ordered for scheduling at hospital.

## 2014-06-01 NOTE — Progress Notes (Signed)
Spoke with nurse from Dr. Jule Economy office in regards to placing order for Coronary CTA. Received permission from Dr. Wynelle Cleveland to place order for imaging. Also received telephone order from Dr. Wynelle Cleveland for Lopressor 25mg  for night before CT and 25mg  for morning of CT. Called pharmacy to verify that medication order had been placed correctly; confirmation received.

## 2014-06-02 ENCOUNTER — Inpatient Hospital Stay (HOSPITAL_COMMUNITY): Payer: BC Managed Care – PPO

## 2014-06-02 ENCOUNTER — Encounter (HOSPITAL_COMMUNITY): Payer: Self-pay

## 2014-06-02 LAB — GLUCOSE, CAPILLARY
Glucose-Capillary: 95 mg/dL (ref 70–99)
Glucose-Capillary: 96 mg/dL (ref 70–99)

## 2014-06-02 LAB — CBC
HCT: 35.8 % — ABNORMAL LOW (ref 39.0–52.0)
Hemoglobin: 11.6 g/dL — ABNORMAL LOW (ref 13.0–17.0)
MCH: 26.5 pg (ref 26.0–34.0)
MCHC: 32.4 g/dL (ref 30.0–36.0)
MCV: 81.7 fL (ref 78.0–100.0)
Platelets: 393 10*3/uL (ref 150–400)
RBC: 4.38 MIL/uL (ref 4.22–5.81)
RDW: 13.1 % (ref 11.5–15.5)
WBC: 8.7 10*3/uL (ref 4.0–10.5)

## 2014-06-02 LAB — PH, BODY FLUID: pH, Fluid: 8

## 2014-06-02 LAB — SEDIMENTATION RATE: Sed Rate: 94 mm/hr — ABNORMAL HIGH (ref 0–16)

## 2014-06-02 MED ORDER — METOPROLOL TARTRATE 1 MG/ML IV SOLN
INTRAVENOUS | Status: AC
  Administered 2014-06-02: 14:00:00
  Filled 2014-06-02: qty 10

## 2014-06-02 MED ORDER — NITROGLYCERIN 0.4 MG SL SUBL
0.4000 mg | SUBLINGUAL_TABLET | SUBLINGUAL | Status: DC | PRN
  Administered 2014-06-02: 0.4 mg via SUBLINGUAL

## 2014-06-02 MED ORDER — METOPROLOL TARTRATE 1 MG/ML IV SOLN
INTRAVENOUS | Status: AC
  Administered 2014-06-02: 14:00:00
  Filled 2014-06-02: qty 5

## 2014-06-02 MED ORDER — IOHEXOL 350 MG/ML SOLN
80.0000 mL | Freq: Once | INTRAVENOUS | Status: AC | PRN
  Administered 2014-06-02: 80 mL via INTRAVENOUS

## 2014-06-02 MED ORDER — BENZONATATE 100 MG PO CAPS
100.0000 mg | ORAL_CAPSULE | Freq: Three times a day (TID) | ORAL | Status: DC
  Administered 2014-06-02 – 2014-06-09 (×18): 100 mg via ORAL
  Filled 2014-06-02 (×25): qty 1

## 2014-06-02 MED ORDER — METOPROLOL TARTRATE 1 MG/ML IV SOLN
INTRAVENOUS | Status: AC
Start: 2014-06-02 — End: 2014-06-02
  Administered 2014-06-02 (×2)
  Filled 2014-06-02: qty 5

## 2014-06-02 MED ORDER — NITROGLYCERIN 0.4 MG SL SUBL
SUBLINGUAL_TABLET | SUBLINGUAL | Status: AC
  Filled 2014-06-02: qty 1

## 2014-06-02 NOTE — Progress Notes (Signed)
[facility] Progress Note   BRAIN HONEYCUTT POE:[id number] DOB: Nov 15, 1960 DOA: 05/30/2014 PCP: Bronson Curb, PA-C  Brief narrative: Colin SELBE is a 54 y.o. male admitted with a complaint of chest pain and shortness of breath. He tells me that his pain was present in the center of the chest and mostly occurs when he exerts himself. He discussed this with Dr. [name] last month and was recommended to see cardiology. He followed up with [facility] medical group and underwent a Myoview stress test which was found to be normal. He is currently not having any chest pain and again tells me it is mostly when he exerts himself and lives heavy items.   Subjective: His cough is quite severe today- no longer having much sputum- having chest pain from cough  Assessment/Plan: Principal Problem:  Chest pain on exertion -Etiology for this is difficult to ascertain-as mentioned above cardiac workup including an echo and stress test were normal -His cardiologist has ordered a coronary CT which will be performed today -We'll continue daily aspirin for now  Active Problems:  Pleural effusion -He underwent a talc pleurodesis of left-sided effusion in October 2015 after multiple recurrences after drainage  -Now has a moderate right-sided effusion which along with fevers and pneumonia is suspicious for parapneumonic effusion versus empyema -I have consult at CT surgery to help decide whether he needs a chest tube -As mentioned above the patient had a normal 2-D echo on 2/18 and therefore effusion is likely not secondary to heart failure -Did have a rheumatology eval as outpatient as well and no cause for his effusion was determined -There was a question of whether he may have had asbestos exposure in the past which could be leading to the effusion - He has been evaluated by CT surgery and they have determined that rather than placing a chest tube, a thoracentesis should be  performed first - this has been done- fluid is bloody with significant amount of Lymphocytes- have consulted pulmonary to assist with management- further test ordered including- repeat ANCA, ANA, RF, anti CCP, ESR - CXR reveals residual effusion   CAP (community acquired pneumonia) -Patient admits to having a cough for 2 weeks-also states that he is been having subjective fevers for about 3 weeks -CT scan reveals that he has pneumonia-he has been placed on Levaquin   Hypertension -Continue metoprolol and ARB    Code Status: Full code Family Communication:  Disposition Plan: Follow-up on pleural fluid results DVT prophylaxis: Lovenox  Consultants: CT surgery  Procedures:   Antibiotics: Anti-infectives    Start     Dose/Rate Route Frequency Ordered Stop   05/31/14 2200  levofloxacin (LEVAQUIN) IVPB 750 mg    Comments:  Levaquin 750 mg IV q24h for CrCl > 50 mL/min   750 mg 100 mL/hr over 90 Minutes Intravenous Every 24 hours 05/31/14 0530     05/31/14 0130  levofloxacin (LEVAQUIN) IVPB 750 mg     750 mg 100 mL/hr over 90 Minutes Intravenous  Once 05/31/14 0125 05/31/14 0335         Objective: Filed Weights   05/30/14 1949 05/31/14 2229  Weight: 124.739 kg (275 lb) 122.063 kg (269 lb 1.6 oz)    Intake/Output Summary (Last 24 hours) at 06/02/14 1155 Last data filed at 06/02/14 0800  Gross per 24 hour  Intake    720 ml  Output    200 ml  Net    520 ml     Vitals  Filed Vitals:   06/01/14 1117 06/01/14 1757 06/01/14 2012 06/02/14 0608  BP: 124/93 134/74 149/80 109/94  Pulse:  83 98 90  Temp:  98.7 F (37.1 C) 98.1 F (36.7 C) 98.7 F (37.1 C)  TempSrc:  Oral Oral Oral  Resp:  '18 18 18  ' Height:      Weight:      SpO2:  98% 97% 95%    Exam: General: Awake alert oriented 3, No acute respiratory distress Lungs: Clear to auscultation bilaterally without wheezes or crackles Cardiovascular: Regular rate and rhythm without murmur gallop or rub normal S1 and  S2 Abdomen: Nontender, nondistended, soft, bowel sounds positive, no rebound, no ascites, no appreciable mass Extremities: No significant cyanosis, clubbing, or edema bilateral lower extremities  Data Reviewed: Basic Metabolic Panel:  Recent Labs Lab 05/30/14 2005 05/31/14 0619  NA 137 138  K 4.1 3.9  CL 103 104  CO2 27 25  GLUCOSE 107* 87  BUN 11 11  CREATININE 0.99 0.91  CALCIUM 8.7 8.4   Liver Function Tests:  Recent Labs Lab 05/31/14 0619  AST 29  ALT 37  ALKPHOS 67  BILITOT 0.7  PROT 6.9  ALBUMIN 2.8*   No results for input(s): LIPASE, AMYLASE in the last 168 hours. No results for input(s): AMMONIA in the last 168 hours. CBC:  Recent Labs Lab 05/30/14 2005 05/31/14 0618 05/31/14 0619 06/01/14 0605 06/02/14 0547  WBC 10.7* 8.2 7.9 7.4 8.7  NEUTROABS 7.8*  --  5.0  --   --   HGB 11.1* 10.6* 10.7* 11.1* 11.6*  HCT 33.8* 32.5* 32.9* 33.7* 35.8*  MCV 81.8 79.9 79.9 79.7 81.7  PLT 396 340 325 396 393   Cardiac Enzymes:  Recent Labs Lab 05/30/14 2005 05/31/14 0619 05/31/14 1155  TROPONINI 0.04* 0.03  0.03 <0.03   BNP (last 3 results)  Recent Labs  05/30/14 2150  BNP 92.5    ProBNP (last 3 results)  Recent Labs  10/06/13 1942  PROBNP 79.8    CBG:  Recent Labs Lab 06/01/14 0756 06/01/14 1220 06/01/14 1712 06/02/14 0008 06/02/14 0611  GLUCAP 105* 101* 100* 95 96    Recent Results (from the past 240 hour(s))  Body fluid culture with gram stain     Status: None (Preliminary result)   Collection Time: 06/01/14 10:59 AM  Result Value Ref Range Status   Specimen Description FLUID PLEURAL RIGHT  Final   Special Requests NONE  Final   Gram Stain   Final    FEW WBC PRESENT,BOTH PMN AND MONONUCLEAR NO ORGANISMS SEEN Performed at [facility]    Culture PENDING  Incomplete   Report Status PENDING  Incomplete  Culture, sputum-assessment     Status: None   Collection Time: 06/01/14  8:48 PM  Result Value Ref Range Status    Specimen Description SPUTUM  Final   Special Requests NONE  Final   Sputum evaluation   Final    THIS SPECIMEN IS ACCEPTABLE. RESPIRATORY CULTURE REPORT TO FOLLOW.   Report Status 06/01/2014 FINAL  Final  Culture, respiratory (NON-Expectorated)     Status: None (Preliminary result)   Collection Time: 06/01/14  8:48 PM  Result Value Ref Range Status   Specimen Description SPUTUM  Final   Special Requests NONE  Final   Gram Stain   Final    MODERATE WBC PRESENT,BOTH PMN AND MONONUCLEAR RARE SQUAMOUS EPITHELIAL CELLS PRESENT FEW GRAM POSITIVE COCCI IN PAIRS IN CLUSTERS Performed at [facility]  Culture PENDING  Incomplete   Report Status PENDING  Incomplete     Studies:  Recent x-ray studies have been reviewed in detail by the Attending Physician  Scheduled Meds:  Scheduled Meds: . aspirin EC  325 mg Oral Daily  . benzonatate  100 mg Oral TID  . levofloxacin (LEVAQUIN) IV  750 mg Intravenous Q24H  . losartan  50 mg Oral Daily  . metoprolol tartrate  12.5 mg Oral BID  . pantoprazole  40 mg Oral Daily  . rOPINIRole  0.25 mg Oral QHS  . sodium chloride  3 mL Intravenous Q12H  . sodium chloride  3 mL Intravenous Q12H   Continuous Infusions:   Time spent on care of this patient: 35 minutes   [place], MD 06/02/2014, 11:55 AM  LOS: 2 days   [facility] Office  [phone] Pager - Text Page per [email or web address]  If 7PM-7AM, please contact night-coverage [email or web address]

## 2014-06-02 NOTE — Consult Note (Signed)
Name: Colin Lee MRN: [id number] DOB: [birth year]    ADMISSION DATE:  05/30/2014 CONSULTATION DATE:  06/02/2014 REFERRING MD :  Debbe Odea  CHIEF COMPLAINT:  Pleural effusion  BRIEF PATIENT DESCRIPTION:  54 yo male former smoker with Lt pleural effusion from July 2015 to October 2015 s/p pleurodesis, now admitted with dyspnea, chest pain and Rt pleural effusion.  STUDIES:  10/06/13 Labs >> C-ANCA 1:640, ANA 1:160, ESR 14, RF < 10 10/26/13 HIV negative, Hepatitis panel negative 12/29/13 Labs >> ESR 37, C-ANCA 1:160, ANA 1:160, ds DNA 1 02/01/14 Lt pleural fluid >> WBC 2204 (59L, 9E, 69M), glucose 90, protein 4.7, LDH 682, ADA 30.7 05/31/14 CT chest >> mod Rt effusion, patchy GGO, b/l ATX 06/01/14 Rt pleural fluid >> WBC 3778 (88L, 3E, 2N, 76M), glucose 93, protein 5.3, LDH 1083    HISTORY OF PRESENT ILLNESS:   54 yo male has hx of Lt pleural effusion s/p thoracentesis x two in July 2015 felt to be related to recurrent PNA.  He was seen by thoracic surgery.  He had recurrence in September 2015.  He was also noted to have elevated ANA and C-ANCA.  He was seen by Dr. Berna Bue with rheumatology >> told he did not have a rheumatology problem.  He had Lt pleurodesis on 02/01/14 >> cx, cytology, pathology all negative.  He had exertional dyspnea while shoveling and was seen by cardiology on 05/12/14 >> nuclear scan was low risk.  He continued to have chest pain and dyspnea and was admitted.  He was found to have Rt pleural effusion.  He had thoracentesis done by IR >> lymphocyte predominate exudate.  He was well prior to May 2015.  He had trouble with his breathing then >> says he was given steroid shot in his butt.  He was then treated for pneumonia in July.  He was doing okay after Lt pleurodesis until December 2015.  He started getting more short of breath with chest pain.  He was also having sweats at night.  He also has been getting soreness in his MCP joints, wrists, elbows,  shoulders b/l.  He reports feeling stiff in the mornings.  He has intermittent cough with clear to yellow sputum.  He denies hemoptysis.  He had episode of wheezing after thoracentesis, but none since.  He has felt better since being started on ABx.  He is not aware of fever, skin rash, sinus congestion, sore throat, abdominal pain, diarrhea.  He quit smoking in 2001.  He is from [place], but has lived in [place] since the 1990's.  He was never in the [employer]  He denies exposure to animals or birds.  There is no hx of TB exposure.  He worked in a [employer] with [employer] >> possible asbestos exposure.  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  PAST MEDICAL HISTORY :   has a past medical history of GERD (gastroesophageal reflux disease); Hypertension; Shortness of breath; Headache; Neuromuscular disorder; and Arthritis.  has past surgical history that includes no prior surgery; Thoracentesis (Left, 2015); Colonoscopy w/ biopsies and polypectomy (2014); Video assisted thoracoscopy (Left, 02/01/2014); Pleural effusion drainage (Left, 02/01/2014); Video bronchoscopy (N/A, 02/01/2014); Pleural biopsy (Left, 02/01/2014); Chest tube insertion (Left, 02/01/2014); Talc pleurodesis (Left, 02/01/2014); and Removal of pleural drainage catheter (Left, 02/16/2014). Prior to Admission medications   Medication Sig Start Date End Date Taking? Authorizing Provider  albuterol (PROVENTIL HFA;VENTOLIN HFA) 108 (90 BASE) MCG/ACT inhaler Inhale 2 puffs into  the lungs every 6 (six) hours as needed for wheezing or shortness of breath.   Yes Historical Provider, MD  aspirin EC 81 MG tablet Take 81 mg by mouth daily.   Yes Historical Provider, MD  ibuprofen (ADVIL,MOTRIN) 800 MG tablet Take 800 mg by mouth every 8 (eight) hours as needed for mild pain or moderate pain.    Yes Historical Provider, MD  losartan (COZAAR) 50 MG tablet Take 50 mg by mouth daily.   Yes Historical Provider, MD  Multiple  Vitamins-Minerals (CENTRUM ADULTS PO) Take 1 tablet by mouth daily.   Yes Historical Provider, MD  omeprazole (PRILOSEC) 20 MG capsule Take 20 mg by mouth daily.   Yes Historical Provider, MD  polyethylene glycol (MIRALAX / GLYCOLAX) packet Take 17 g by mouth daily. Patient taking differently: Take 17 g by mouth daily as needed for mild constipation or moderate constipation.  10/30/13  Yes Samuella Cota, MD  rOPINIRole (REQUIP) 0.25 MG tablet Take 1 tablet (0.25 mg total) by mouth at bedtime. 10/11/13  Yes Kathie Dike, MD   No Known Allergies  FAMILY HISTORY:  family history is not on file. He was adopted. SOCIAL HISTORY:  reports that he quit smoking about 14 years ago. His smoking use included Cigarettes. He has a 30 pack-year smoking history. He has never used smokeless tobacco. He reports that he drinks alcohol. He reports that he does not use illicit drugs.  REVIEW OF SYSTEMS:   Negative except above  SUBJECTIVE:   VITAL SIGNS: Temp:  [98.1 F (36.7 C)-99 F (37.2 C)] 98.7 F (37.1 C) (02/25 0608) Pulse Rate:  [83-98] 90 (02/25 0608) Resp:  [18] 18 (02/25 0608) BP: (109-149)/(74-94) 109/94 mmHg (02/25 0608) SpO2:  [95 %-98 %] 95 % (02/25 9798)  PHYSICAL EXAMINATION: General: pleasant Neuro:  Normal strength, CN intact HEENT:  Pupils reactive, no oral exudate, no LAN Cardiovascular: regular, no murmur Lungs:  No wheeze/rales Abdomen:  Obese, soft, non tender Musculoskeletal:  No edema, no joint swelling Skin:  No rashes   Recent Labs Lab 05/30/14 2005 05/31/14 0619  NA 137 138  K 4.1 3.9  CL 103 104  CO2 27 25  BUN 11 11  CREATININE 0.99 0.91  GLUCOSE 107* 87    Recent Labs Lab 05/31/14 0619 06/01/14 0605 06/02/14 0547  HGB 10.7* 11.1* 11.6*  HCT 32.9* 33.7* 35.8*  WBC 7.9 7.4 8.7  PLT 325 396 393   Dg Chest 1 View  06/01/2014   CLINICAL DATA:  Status post thoracentesis.  EXAM: CHEST  1 VIEW  COMPARISON:  Chest CT 05/31/2014  FINDINGS: The  cardiac silhouette, mediastinal and hilar contours are Stable. There is tortuosity and calcification of the thoracic aorta. Interval decrease and right-sided pleural effusion. No postprocedural pneumothorax is identified.  IMPRESSION: Decrease in right-sided pleural effusion. No postprocedural pneumothorax.   Electronically Signed   By: Marijo Sanes M.D.   On: 06/01/2014 12:19   Dg Chest Port 1 View  06/02/2014   CLINICAL DATA:  Pleural effusion.  EXAM: PORTABLE CHEST - 1 VIEW  COMPARISON:  June 01, 2014.  FINDINGS: Stable cardiomediastinal silhouette. No pneumothorax is noted. Bony thorax is intact. Stable mild right pleural effusion is noted. No other significant abnormality is noted.  IMPRESSION: Stable mild right pleural effusion.   Electronically Signed   By: Marijo Conception, M.D.   On: 06/02/2014 09:36   US Thoracentesis Asp Pleural Space W/img Guide  06/01/2014   INDICATION: Symptomatic right sided  pleural effusion  EXAM: US THORACENTESIS ASP PLEURAL SPACE W/IMG GUIDE  COMPARISON:  Previous thoracentesis  MEDICATIONS: 10 cc 1% lidocaine  COMPLICATIONS: None immediate  TECHNIQUE: Informed written consent was obtained from the patient after a discussion of the risks, benefits and alternatives to treatment. A timeout was performed prior to the initiation of the procedure.  Initial ultrasound scanning demonstrates a right pleural effusion. The lower chest was prepped and draped in the usual sterile fashion. 1% lidocaine was used for local anesthesia.  Under direct ultrasound guidance, a 19 gauge, 7-cm, Yueh catheter was introduced. An ultrasound image was saved for documentation purposes. the thoracentesis was performed. The catheter was removed and a dressing was applied. The patient tolerated the procedure well without immediate post procedural complication. The patient was escorted to have an upright chest radiograph.  FINDINGS: A total of approximately 420 cc of blood tinged fluid was removed.  Requested samples were sent to the laboratory.  IMPRESSION: Successful ultrasound-guided R sided thoracentesis yielding 420 cc of pleural fluid.  Read by:  Lavonia Drafts [facility]   Electronically Signed   By: Jerilynn Mages.  Shick M.D.   On: 06/01/2014 14:42   DISCUSSION: He had lymphocyte predominant Lt exudative effusion from July to October of 2015.  He was found to have elevated C-ANCA and ANA.  Culture results and cytology were unrevealing, but he did have borderline elevation in ADA from Lt fluid sample in October 2015.  He reports being seen by Rheumatology, but was told his evaluation was negative.  He now has chest pain, dyspnea, night sweats and lymphocyte predominate Rt pleural effusion.  He also reports b/l symmetric upper extremity joint stiffness.  ASSESSMENT / PLAN:  Rt pleural fluid cx 2/24 >> Sputum cx 2/24 >>  Rt pleural effusion. Plan: - f/u pleural fluid cytology, cultures, ADA from 2/24 - repeat ANCA, ANA, RF, anti CCP, ESR - defer steroids for now - f/u 2 view CXR 2/26 - day 4 of levaquin   Chesley Mires, MD [place] 06/02/2014, 10:42 AM Pager:  [phone] After 3pm call: [phone]

## 2014-06-02 NOTE — Progress Notes (Signed)
Pt with temp of 101 orally. Pt was given 650 mg of tylenol po. Pt temp down to 99.6.

## 2014-06-03 ENCOUNTER — Inpatient Hospital Stay (HOSPITAL_COMMUNITY): Payer: BC Managed Care – PPO

## 2014-06-03 DIAGNOSIS — M791 Myalgia, unspecified site: Secondary | ICD-10-CM | POA: Diagnosis present

## 2014-06-03 DIAGNOSIS — R918 Other nonspecific abnormal finding of lung field: Secondary | ICD-10-CM

## 2014-06-03 DIAGNOSIS — R509 Fever, unspecified: Secondary | ICD-10-CM | POA: Diagnosis present

## 2014-06-03 DIAGNOSIS — M2550 Pain in unspecified joint: Secondary | ICD-10-CM

## 2014-06-03 DIAGNOSIS — D509 Iron deficiency anemia, unspecified: Secondary | ICD-10-CM

## 2014-06-03 DIAGNOSIS — J948 Other specified pleural conditions: Secondary | ICD-10-CM

## 2014-06-03 DIAGNOSIS — D649 Anemia, unspecified: Secondary | ICD-10-CM | POA: Diagnosis present

## 2014-06-03 DIAGNOSIS — J90 Pleural effusion, not elsewhere classified: Secondary | ICD-10-CM | POA: Diagnosis present

## 2014-06-03 DIAGNOSIS — I10 Essential (primary) hypertension: Secondary | ICD-10-CM

## 2014-06-03 HISTORY — DX: Anemia, unspecified: D64.9

## 2014-06-03 HISTORY — DX: Pain in unspecified joint: M25.50

## 2014-06-03 LAB — FANA STAINING PATTERNS: Speckled Pattern: >1:1280 {titer}

## 2014-06-03 LAB — ANTINUCLEAR ANTIBODIES, IFA

## 2014-06-03 LAB — RHEUMATOID FACTOR: Rhuematoid fact SerPl-aCnc: 8 IU/mL (ref 0.0–13.9)

## 2014-06-03 LAB — CYCLIC CITRUL PEPTIDE ANTIBODY, IGG/IGA: CCP Antibodies IgG/IgA: 7 units (ref 0–19)

## 2014-06-03 MED ORDER — POLYETHYLENE GLYCOL 3350 17 G PO PACK
17.0000 g | PACK | Freq: Every day | ORAL | Status: DC
  Administered 2014-06-03 – 2014-06-06 (×4): 17 g via ORAL
  Filled 2014-06-03 (×7): qty 1

## 2014-06-03 MED ORDER — ENOXAPARIN SODIUM 60 MG/0.6ML ~~LOC~~ SOLN
60.0000 mg | SUBCUTANEOUS | Status: DC
  Administered 2014-06-03 – 2014-06-05 (×3): 60 mg via SUBCUTANEOUS
  Filled 2014-06-03 (×3): qty 0.6

## 2014-06-03 NOTE — Progress Notes (Signed)
[facility] Progress Note   Colin Lee:[id number] DOB: 25-Feb-1961 DOA: 05/30/2014 PCP: Bronson Curb, PA-C  Brief narrative: Colin Lee is a 54 y.o. male admitted with a complaint of chest pain and shortness of breath. He tells me that his pain was present in the center of the chest and mostly occurs when he exerts himself. He discussed this with Dr. [name] last month and was recommended to see cardiology. He followed up with [facility] and underwent a Myoview stress test which was found to be normal. He is currently not having any chest pain and again tells me it is mostly when he exerts himself and lives heavy items.   Subjective: Continues to have cough productive of colored sputum- has not exerted himself yet to know if his chest pain is recurring. Had a fever last night which has been happening at home  Assessment/Plan: Principal Problem:  Chest pain on exertion -Etiology for this is difficult to ascertain-as mentioned above cardiac workup including an echo and stress test were normal -Coronary CT performed - will discuss with Dr Radford Pax who ordered the test -We'll continue daily aspirin for now  Active Problems:  Pleural effusion -He underwent a talc pleurodesis of left-sided effusion in October 2015 after multiple recurrences after drainage  -Now presented with moderate right-sided effusion which along with fevers and pneumonia  -As mentioned above the patient had a normal 2-D echo on 2/18 and therefore effusion is likely not secondary to heart failure -Did have a rheumatology eval as outpatient as well and no cause for his effusion was determined -There was a question of whether he may have had asbestos exposure in the past which could be leading to the effusion-  - He has been evaluated by CT surgery and they have determined that rather than placing a chest tube, a thoracentesis should be performed first - this has been done-  fluid is bloody with significant amount of Lymphocytes- have consulted pulmonary to assist with management- further test ordered including- repeat ANCA, ANA, RF, anti CCP, ESR - CXR reveals residual effusion - ADA pending  - will consult ID today- per Dr Megan Salon, will need to r/o TB and recommended to place pt on airborne precautions   CAP (community acquired pneumonia) -Patient admits to having a cough for 2 weeks-also states that he is been having subjective fevers for about 3 weeks -CT scan reveals that he has pneumonia-he has been placed on Levaquin   Hypertension -Continue metoprolol and ARB    Code Status: Full code Family Communication:  Disposition Plan:  DVT prophylaxis: Lovenox  Consultants: CT surgery  Procedures:   Antibiotics: Anti-infectives    Start     Dose/Rate Route Frequency Ordered Stop   05/31/14 2200  levofloxacin (LEVAQUIN) IVPB 750 mg    Comments:  Levaquin 750 mg IV q24h for CrCl > 50 mL/min   750 mg 100 mL/hr over 90 Minutes Intravenous Every 24 hours 05/31/14 0530     05/31/14 0130  levofloxacin (LEVAQUIN) IVPB 750 mg     750 mg 100 mL/hr over 90 Minutes Intravenous  Once 05/31/14 0125 05/31/14 0335         Objective: Filed Weights   05/30/14 1949 05/31/14 2229 06/02/14 2057  Weight: 124.739 kg (275 lb) 122.063 kg (269 lb 1.6 oz) 120.657 kg (266 lb)    Intake/Output Summary (Last 24 hours) at 06/03/14 1141 Last data filed at 06/03/14 1002  Gross per 24 hour  Intake  600 ml  Output      0 ml  Net    600 ml     Vitals Filed Vitals:   06/02/14 2057 06/02/14 2258 06/03/14 0541 06/03/14 1001  BP: 125/58  122/71 135/84  Pulse: 88  82 101  Temp: 101 F (38.3 C) 99.6 F (37.6 C) 98.8 F (37.1 C) 99.7 F (37.6 C)  TempSrc:  Oral  Oral  Resp: '18  17 18  ' Height:      Weight: 120.657 kg (266 lb)     SpO2: 95%  94% 96%    Exam: General: Awake alert oriented 3, No acute respiratory distress Lungs: Clear to auscultation  bilaterally without wheezes or crackles Cardiovascular: Regular rate and rhythm without murmur gallop or rub normal S1 and S2 Abdomen: Nontender, nondistended, soft, bowel sounds positive, no rebound, no ascites, no appreciable mass Extremities: No significant cyanosis, clubbing, or edema bilateral lower extremities  Data Reviewed: Basic Metabolic Panel:  Recent Labs Lab 05/30/14 2005 05/31/14 0619  NA 137 138  K 4.1 3.9  CL 103 104  CO2 27 25  GLUCOSE 107* 87  BUN 11 11  CREATININE 0.99 0.91  CALCIUM 8.7 8.4   Liver Function Tests:  Recent Labs Lab 05/31/14 0619  AST 29  ALT 37  ALKPHOS 67  BILITOT 0.7  PROT 6.9  ALBUMIN 2.8*   No results for input(s): LIPASE, AMYLASE in the last 168 hours. No results for input(s): AMMONIA in the last 168 hours. CBC:  Recent Labs Lab 05/30/14 2005 05/31/14 0618 05/31/14 0619 06/01/14 0605 06/02/14 0547  WBC 10.7* 8.2 7.9 7.4 8.7  NEUTROABS 7.8*  --  5.0  --   --   HGB 11.1* 10.6* 10.7* 11.1* 11.6*  HCT 33.8* 32.5* 32.9* 33.7* 35.8*  MCV 81.8 79.9 79.9 79.7 81.7  PLT 396 340 325 396 393   Cardiac Enzymes:  Recent Labs Lab 05/30/14 2005 05/31/14 0619 05/31/14 1155  TROPONINI 0.04* 0.03  0.03 <0.03   BNP (last 3 results)  Recent Labs  05/30/14 2150  BNP 92.5    ProBNP (last 3 results)  Recent Labs  10/06/13 1942  PROBNP 79.8    CBG:  Recent Labs Lab 06/01/14 0756 06/01/14 1220 06/01/14 1712 06/02/14 0008 06/02/14 0611  GLUCAP 105* 101* 100* 95 96    Recent Results (from the past 240 hour(s))  Body fluid culture with gram stain     Status: None (Preliminary result)   Collection Time: 06/01/14 10:59 AM  Result Value Ref Range Status   Specimen Description FLUID PLEURAL RIGHT  Final   Special Requests NONE  Final   Gram Stain   Final    FEW WBC PRESENT,BOTH PMN AND MONONUCLEAR NO ORGANISMS SEEN Performed at [facility]    Culture   Final    NO GROWTH 1 DAY Performed at [facility]    Report Status PENDING  Incomplete  Culture, sputum-assessment     Status: None   Collection Time: 06/01/14  8:48 PM  Result Value Ref Range Status   Specimen Description SPUTUM  Final   Special Requests NONE  Final   Sputum evaluation   Final    THIS SPECIMEN IS ACCEPTABLE. RESPIRATORY CULTURE REPORT TO FOLLOW.   Report Status 06/01/2014 FINAL  Final  Culture, respiratory (NON-Expectorated)     Status: None (Preliminary result)   Collection Time: 06/01/14  8:48 PM  Result Value Ref Range Status   Specimen Description SPUTUM  Final  Special Requests NONE  Final   Gram Stain   Final    MODERATE WBC PRESENT,BOTH PMN AND MONONUCLEAR RARE SQUAMOUS EPITHELIAL CELLS PRESENT FEW GRAM POSITIVE COCCI IN PAIRS IN CLUSTERS Performed at [facility]    Culture   Final    NORMAL OROPHARYNGEAL FLORA Performed at [facility]    Report Status PENDING  Incomplete     Studies:  Recent x-ray studies have been reviewed in detail by the Attending Physician  Scheduled Meds:  Scheduled Meds: . aspirin EC  325 mg Oral Daily  . benzonatate  100 mg Oral TID  . levofloxacin (LEVAQUIN) IV  750 mg Intravenous Q24H  . losartan  50 mg Oral Daily  . metoprolol tartrate  12.5 mg Oral BID  . pantoprazole  40 mg Oral Daily  . rOPINIRole  0.25 mg Oral QHS  . sodium chloride  3 mL Intravenous Q12H  . sodium chloride  3 mL Intravenous Q12H   Continuous Infusions:   Time spent on care of this patient: 35 minutes   [place], MD 06/03/2014, 11:41 AM  LOS: 3 days   [facility] Office  [phone] Pager - Text Page per [email or web address]  If 7PM-7AM, please contact night-coverage [email or web address]

## 2014-06-03 NOTE — Consult Note (Signed)
Name: Colin Lee MRN: [id number] DOB: 1960-05-17    ADMISSION DATE:  05/30/2014 CONSULTATION DATE:  06/02/2014 REFERRING MD :  Debbe Odea  CHIEF COMPLAINT:  Pleural effusion  BRIEF PATIENT DESCRIPTION:  54 yo male former smoker with Lt pleural effusion from July 2015 to October 2015 s/p pleurodesis, now admitted with dyspnea, chest pain and Rt pleural effusion.  STUDIES:  10/06/13 Labs >> C-ANCA 1:640, ANA 1:160, ESR 14, RF < 10 10/26/13 HIV negative, Hepatitis panel negative 12/29/13 Labs >> ESR 37, C-ANCA 1:160, ANA 1:160, ds DNA 1 02/01/14 Lt pleural fluid >> WBC 2204 (59L, 9E, 50M), glucose 90, protein 4.7, LDH 682, ADA 30.7 05/31/14 CT chest >> mod Rt effusion, patchy GGO, b/l ATX 06/01/14 Rt pleural fluid >> WBC 3778 (88L, 3E, 2N, 51M), glucose 93, protein 5.3, LDH 1083  SUBJECTIVE:  Had fever yesterday.  Still has pain in chest with breathing.  VITAL SIGNS: Temp:  [98.8 F (37.1 C)-101 F (38.3 C)] 99.7 F (37.6 C) (02/26 1001) Pulse Rate:  [73-101] 101 (02/26 1001) Resp:  [17-18] 18 (02/26 1001) BP: (122-135)/(58-84) 135/84 mmHg (02/26 1001) SpO2:  [94 %-96 %] 96 % (02/26 1001) Weight:  [266 lb (120.657 kg)] 266 lb (120.657 kg) (02/25 2057)  PHYSICAL EXAMINATION: General: pleasant Neuro:  Normal strength HEENT:  no oral exudate, no LAN Cardiovascular: regular, no murmur Lungs:  No wheeze/rales Abdomen:  Obese, soft, non tender Musculoskeletal:  No edema, no joint swelling Skin:  No rashes   Recent Labs Lab 05/30/14 2005 05/31/14 0619  NA 137 138  K 4.1 3.9  CL 103 104  CO2 27 25  BUN 11 11  CREATININE 0.99 0.91  GLUCOSE 107* 87    Recent Labs Lab 05/31/14 0619 06/01/14 0605 06/02/14 0547  HGB 10.7* 11.1* 11.6*  HCT 32.9* 33.7* 35.8*  WBC 7.9 7.4 8.7  PLT 325 396 393   Dg Chest 1 View  06/01/2014   CLINICAL DATA:  Status post thoracentesis.  EXAM: CHEST  1 VIEW  COMPARISON:  Chest CT 05/31/2014  FINDINGS: The cardiac silhouette,  mediastinal and hilar contours are Stable. There is tortuosity and calcification of the thoracic aorta. Interval decrease and right-sided pleural effusion. No postprocedural pneumothorax is identified.  IMPRESSION: Decrease in right-sided pleural effusion. No postprocedural pneumothorax.   Electronically Signed   By: Marijo Sanes M.D.   On: 06/01/2014 12:19   Dg Chest 2 View  06/03/2014   CLINICAL DATA:  54 year old male with right pleural effusion status post thoracentesis yesterday. Initial encounter.  EXAM: CHEST  2 VIEW  COMPARISON:  Chest CT 06/02/2014 and earlier.  FINDINGS: Small bilateral pleural effusions have not significantly changed since January. The right is larger. No pneumothorax identified. Stable cardiac size and mediastinal contours. Stable streaky perihilar and basilar pulmonary opacity. No interval areas of worsening ventilation. Bulky first rib costochondral calcification re - identified. Visualized tracheal air column is within normal limits. No acute osseous abnormality identified.  IMPRESSION: Small bilateral pleural effusions. No pneumothorax after thoracentesis. No significant change in ventilation.   Electronically Signed   By: Genevie Ann M.D.   On: 06/03/2014 07:52   Ct Coronary Morp W/cta Cor W/score W/ca W/cm &/or Wo/cm  06/02/2014   ADDENDUM REPORT: 06/02/2014 20:52  CLINICAL DATA:  Chest pain  EXAM: Cardiac/Coronary  CT  TECHNIQUE: The patient was scanned on a Philips 256 scanner.  FINDINGS: A 120 kV prospective scan was triggered in the descending thoracic aorta at 111 HU's. Axial  non-contrast 3 mm slices were carried out through the heart. The data set was analyzed on a dedicated work station and scored using the Revloc. Gantry rotation speed was 270 msecs and collimation was .9 mm. No beta blockade and 0.4 mg of sl NTG was given. The 3D data set was reconstructed in 5% intervals of the 67-82 % of the R-R cycle. Diastolic phases were analyzed on a dedicated work station  using MPR, MIP and VRT modes. The patient received 80 cc of contrast.  Aorta:  Aortic Valve:  Coronary Arteries: Originating in normal position. Right dominance.  RCA is a very large dominant vessel that gives rise to PDA and PLA arteries. There is mild calcified plaque in the proximal portion associated with 25-50% stenosis. Proximal to mid portion is affected by motion and accurate stenosis is not possible. Mid and distal RCA has no significant stenosis.  Left main is a large vessel that has mild mixed, predominantly calcified plaque in its proximal portion associated with 25-50% stenosis.  LAD is a large vessel that has mild calcified plaque in its proximal segment associated with 25-50% stenosis and minimal plaque in the mid segment with 0-25% stenosis. LAD gives rise to two diagonal branches with no significant stenosis.  LCX artery is a medium size non-dominant vessel that gives rise to one obtuse marginal branch. There is only minimal calcified plaque in mid LCX artery associated with 0-25% stenosis.  IMPRESSION: 1. Coronary calcium score of 236. This was 28 percentile for age and sex matched control.  2.  Normal coronary origin.  Right dominance.  3. 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 Significant motion artifact in the proximal to mid RCA affecting ability read, however there is suspicion for a significant stenosis.  Ena Dawley   Electronically Signed   By: Ena Dawley   On: 06/02/2014 20:52   06/02/2014   EXAM: OVER-READ INTERPRETATION  CT CHEST  The following report is an over-read performed by radiologist Dr. Forest Gleason [facility] on 06/02/2014. This over-read does not include interpretation of cardiac or coronary anatomy or pathology. The coronary calcium score/coronary CTA interpretation by the cardiologist is attached.  COMPARISON:  Chest radiograph above 06/02/2014. Chest CT of the 2 days prior.  FINDINGS: Mediastinum/Nodes: Mediastinal adenopathy, as detailed on  recent diagnostic CT. A right paratracheal node measures 1.3 cm on image 10 of series 504.  Subcarinal node measures 1.3 cm on image 24.  1.6 cm right hilar node is mildly enlarged.  Aortic atherosclerosis, without aneurysm.  Lungs/Pleura: Decrease in size of a small right pleural effusion. Trace left pleural fluid or thickening. Circumferential right-sided pleural thickening is also identified and is mild.  Subpleural left upper lobe ground-glass opacity is improved. Scattered areas of subsegmental atelectasis in both lungs.  Upper abdomen: Hepatic steatosis. Central left hepatic lobe cyst. Cholelithiasis.  Musculoskeletal: No acute osseous abnormality.  IMPRESSION: 1. Thoracic adenopathy, as detailed on 05/31/2014 CT. This could be reactive or related to congestive heart failure. Consider followup chest CT at 3 months to confirm stability. 2. Improved left upper lobe ground-glass opacity, possibly related to pulmonary edema. 3. Decrease in small right pleural effusion with bilateral pleural thickening.  Electronically Signed: By: Abigail Miyamoto M.D. On: 06/02/2014 14:52   Dg Chest Port 1 View  06/02/2014   CLINICAL DATA:  Pleural effusion.  EXAM: PORTABLE CHEST - 1 VIEW  COMPARISON:  June 01, 2014.  FINDINGS: Stable cardiomediastinal silhouette. No pneumothorax is noted. Bony thorax  is intact. Stable mild right pleural effusion is noted. No other significant abnormality is noted.  IMPRESSION: Stable mild right pleural effusion.   Electronically Signed   By: Marijo Conception, M.D.   On: 06/02/2014 09:36   US Thoracentesis Asp Pleural Space W/img Guide  06/01/2014   INDICATION: Symptomatic right sided pleural effusion  EXAM: US THORACENTESIS ASP PLEURAL SPACE W/IMG GUIDE  COMPARISON:  Previous thoracentesis  MEDICATIONS: 10 cc 1% lidocaine  COMPLICATIONS: None immediate  TECHNIQUE: Informed written consent was obtained from the patient after a discussion of the risks, benefits and alternatives to treatment. A  timeout was performed prior to the initiation of the procedure.  Initial ultrasound scanning demonstrates a right pleural effusion. The lower chest was prepped and draped in the usual sterile fashion. 1% lidocaine was used for local anesthesia.  Under direct ultrasound guidance, a 19 gauge, 7-cm, Yueh catheter was introduced. An ultrasound image was saved for documentation purposes. the thoracentesis was performed. The catheter was removed and a dressing was applied. The patient tolerated the procedure well without immediate post procedural complication. The patient was escorted to have an upright chest radiograph.  FINDINGS: A total of approximately 420 cc of blood tinged fluid was removed. Requested samples were sent to the laboratory.  IMPRESSION: Successful ultrasound-guided R sided thoracentesis yielding 420 cc of pleural fluid.  Read by:  Lavonia Drafts [facility]   Electronically Signed   By: Jerilynn Mages.  Shick M.D.   On: 06/01/2014 14:42   DISCUSSION: He had lymphocyte predominant Lt exudative effusion from July to October of 2015.  He was found to have elevated C-ANCA and ANA.  Culture results and cytology were unrevealing, but he did have borderline elevation in ADA from Lt fluid sample in October 2015.  He reports being seen by Rheumatology, but was told his evaluation was negative.  He now has chest pain, dyspnea, night sweats and lymphocyte predominate Rt pleural effusion.  He also reports b/l symmetric upper extremity joint stiffness.  Labs from 2/25 >> ESR 94, RF 8.  ASSESSMENT / PLAN:  Rt pleural fluid cx 2/24 >> Sputum cx 2/24 >>  Rt pleural effusion. Plan: - f/u pleural fluid cytology, cultures, ADA from 2/24 - repeat ANCA, ANA, anti CCP - defer steroids for now - f/u 2 view CXR 2/27 - day 5 of levaquin - suggest ID consult with ?of TB pleuritis - consider repeating thoracentesis and sending for AFB culture if effusion recurs   Chesley Mires, MD [place] 06/03/2014,  10:02 AM Pager:  [phone] After 3pm call: [phone]

## 2014-06-03 NOTE — Progress Notes (Signed)
ANTICOAGULATION CONSULT NOTE - Initial Consult  Pharmacy Consult for lovenox Indication: VTE prophylaxis  No Known Allergies  Patient Measurements: Height: 5\' 10"  (177.8 cm) Weight: 266 lb (120.657 kg) IBW/kg (Calculated) : 73   Vital Signs: Temp: 99.7 F (37.6 C) (02/26 1001) Temp Source: Oral (02/26 1001) BP: 135/84 mmHg (02/26 1001) Pulse Rate: 101 (02/26 1001)  Labs:  Recent Labs  06/01/14 0605 06/02/14 0547  HGB 11.1* 11.6*  HCT 33.7* 35.8*  PLT 396 393    Estimated Creatinine Clearance: 125.1 mL/min (by C-G formula based on Cr of 0.91).   Medical History: Past Medical History  Diagnosis Date  . GERD (gastroesophageal reflux disease)   . Hypertension   . Shortness of breath     Starting May 2015  . Headache     stress and tension  . Neuromuscular disorder     restless legs and leg cramps  . Arthritis   . Asthma    Assessment: Patient is a 54 y.o M known to pharmacy from heparin consult on admit for CP.  ACS was r/o with Heparin drip  d/ced on 2/23 and lovenox 60mg  SQ q24h (0.5mg /kg/day - adjusted dosefor BMI >30) started for  VTE prophylaxis.  Patient got one dose of lovenox on 2/23 but LMWH was d/ced for thoracenetesis on 2/24 for pleural effusion.  To start lovenox back today per Dr. Wynelle Cleveland. CBC stable.  No bleeding reported.   Goal of Therapy:  Anti-Xa 0.3-0.6 Monitor platelets by anticoagulation protocol: Yes   Plan:  - lovenox 60mg  SQ q24h (adjusted dose for high BMI) -cbc q72h - pharmacy will sign off - re-consult [place] if need further assistance  Ceilidh Torregrossa P 06/03/2014,12:43 PM

## 2014-06-03 NOTE — Consult Note (Signed)
[facility] for Infectious Disease    Date of Admission:  05/30/2014           Day 5 levofloxacin       Reason for Consult: Recurrent, exudative pleural effusions    Referring Physician: Dr. Debbe Odea  Principal Problem:   Pleural effusion exudative Active Problems:   Hypertension   Chest pain   Pulmonary infiltrate   Low grade fever   Myalgia   Arthralgia   Normocytic anemia   . aspirin EC  325 mg Oral Daily  . benzonatate  100 mg Oral TID  . enoxaparin (LOVENOX) injection  60 mg Subcutaneous Q24H  . levofloxacin (LEVAQUIN) IV  750 mg Intravenous Q24H  . losartan  50 mg Oral Daily  . metoprolol tartrate  12.5 mg Oral BID  . pantoprazole  40 mg Oral Daily  . rOPINIRole  0.25 mg Oral QHS  . sodium chloride  3 mL Intravenous Q12H  . sodium chloride  3 mL Intravenous Q12H    Recommendations: 1. Discontinue levofloxacin 2. Discontinue airborne precautions 3. Consider trial of steroids   Assessment: I do not think that he is likely to have tuberculosis or other active infection at this time. Although his ADA was slightly elevated last October the preponderance of data strongly go against a diagnosis of tuberculosis. He has had negative AFB stains and cultures from pleural fluid and pleural biopsy on 3 separate occasions last year. He has a history of a negative PPD skin test 2 years ago when he started work for the [employer] system. Also, if he had untreated tuberculosis for the past 9 months he would be much sicker than he is currently. 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 I think it is worth considering a trial of steroids soon.   HPI: Colin Lee is a 54 y.o. male who was in relatively good health until May of last year when he began to develop gradual onset of dyspnea on exertion, chest pain, fatigue, low-grade fevers and aching and stiffness in his hands. He saw his primary care physician and recalls being treated  with a steroid injection and azithromycin. He felt much better very quickly and did well until late June when his symptoms started to recur. He was admitted to the [facility] in early July and was found to have a large left pleural effusion. There was concern that he had underlying pneumonia so he was treated with empiric antibiotics. He was readmitted in late July with persistent effusion and possible HCAP. He underwent thoracentesis which revealed an exudative effusion with a predominance of lymphocytes. All stains and cultures were negative. Cytology revealed only benign reactive changes with mesothelial cells. He completed an outpatient course of amoxicillin clavulanate and felt better until late September when his effusion recurred again. Thoracentesis again revealed an exudative effusion with predominance of lymphocytes. All stains and cultures were negative. He was referred and admitted for VATS drainage of the effusion, pleural biopsy and pleurodesis. Biopsy revealed benign chronic inflammation. All stains and cultures were again negative. His ADA was slightly elevated in October. His ANA was positive and his c-ANCA was positive. He was referred to Dr. Gavin Pound in November and saw her on 1 occasion. He recalls being told that he did not have a rheumatologic condition. He felt better after the pleurodesis and did well until late December when his symptoms began to recur. He has  had increasing shortness of breath and exertional chest pain over the past month. He has had low-grade fevers and the aching and stiffness in his hands and arms. He has had some night sweats. He states his appetite is quite good and he has not lost more than 4-5 pounds. When he was readmitted 4 days ago he was found to have a right-sided effusion and some patchy left upper lobe and right lower lobe infiltrates. Thoracentesis revealed a white cell count of 3778 with 88% lymphocytes and very high protein and  LDH.   Review of Systems: Pertinent items are noted in HPI.  Past Medical History  Diagnosis Date  . GERD (gastroesophageal reflux disease)   . Hypertension   . Shortness of breath     Starting May 2015  . Headache     stress and tension  . Neuromuscular disorder     restless legs and leg cramps  . Arthritis   . Asthma     History  Substance Use Topics  . Smoking status: Former Smoker -- 1.00 packs/day for 30 years    Types: Cigarettes    Quit date: 04/08/2000  . Smokeless tobacco: Never Used  . Alcohol Use: Yes     Comment: rare    Family History  Problem Relation Age of Onset  . Adopted: Yes   No Known Allergies  OBJECTIVE: Blood pressure 135/84, pulse 101, temperature 99.7 F (37.6 C), temperature source Oral, resp. rate 18, height 5\' 10"  (1.778 m), weight 266 lb (120.657 kg), SpO2 96 %. General: He is alert, comfortable and in good spirits Skin: No rash Lymph nodes: No palpable adenopathy Lungs: Clear anteriorly with diminished breath sounds in right base posteriorly Cor: Regular S1 and S2 with no murmur Abdomen: Obese, soft and nontender Extremities: No acute abnormalities  Lab Results Lab Results  Component Value Date   WBC 8.7 06/02/2014   HGB 11.6* 06/02/2014   HCT 35.8* 06/02/2014   MCV 81.7 06/02/2014   PLT 393 06/02/2014    Lab Results  Component Value Date   CREATININE 0.91 05/31/2014   BUN 11 05/31/2014   NA 138 05/31/2014   K 3.9 05/31/2014   CL 104 05/31/2014   CO2 25 05/31/2014    Lab Results  Component Value Date   ALT 37 05/31/2014   AST 29 05/31/2014   ALKPHOS 67 05/31/2014   BILITOT 0.7 05/31/2014     Microbiology: Recent Results (from the past 240 hour(s))  Body fluid culture with gram stain     Status: None (Preliminary result)   Collection Time: 06/01/14 10:59 AM  Result Value Ref Range Status   Specimen Description FLUID PLEURAL RIGHT  Final   Special Requests NONE  Final   Gram Stain   Final    FEW WBC  PRESENT,BOTH PMN AND MONONUCLEAR NO ORGANISMS SEEN Performed at [facility]    Culture   Final    NO GROWTH 2 DAYS Performed at [facility]    Report Status PENDING  Incomplete  Culture, sputum-assessment     Status: None   Collection Time: 06/01/14  8:48 PM  Result Value Ref Range Status   Specimen Description SPUTUM  Final   Special Requests NONE  Final   Sputum evaluation   Final    THIS SPECIMEN IS ACCEPTABLE. RESPIRATORY CULTURE REPORT TO FOLLOW.   Report Status 06/01/2014 FINAL  Final  Culture, respiratory (NON-Expectorated)     Status: None (Preliminary result)   Collection Time:  06/01/14  8:48 PM  Result Value Ref Range Status   Specimen Description SPUTUM  Final   Special Requests NONE  Final   Gram Stain   Final    MODERATE WBC PRESENT,BOTH PMN AND MONONUCLEAR RARE SQUAMOUS EPITHELIAL CELLS PRESENT FEW GRAM POSITIVE COCCI IN PAIRS IN CLUSTERS Performed at [facility]    Culture   Final    NORMAL OROPHARYNGEAL FLORA Performed at [facility]    Report Status PENDING  Incomplete    Michel Bickers, [place] for [facility] [phone] pager   [phone] cell 06/03/2014, 4:35 PM

## 2014-06-04 ENCOUNTER — Inpatient Hospital Stay (HOSPITAL_COMMUNITY): Payer: BC Managed Care – PPO

## 2014-06-04 DIAGNOSIS — R509 Fever, unspecified: Secondary | ICD-10-CM

## 2014-06-04 DIAGNOSIS — I209 Angina pectoris, unspecified: Secondary | ICD-10-CM

## 2014-06-04 DIAGNOSIS — D649 Anemia, unspecified: Secondary | ICD-10-CM

## 2014-06-04 LAB — BODY FLUID CULTURE: Culture: NO GROWTH

## 2014-06-04 LAB — CULTURE, RESPIRATORY

## 2014-06-04 LAB — CULTURE, RESPIRATORY W GRAM STAIN: Culture: NORMAL

## 2014-06-04 MED ORDER — METHYLPREDNISOLONE SODIUM SUCC 125 MG IJ SOLR
60.0000 mg | Freq: Two times a day (BID) | INTRAMUSCULAR | Status: DC
  Administered 2014-06-04 – 2014-06-06 (×4): 60 mg via INTRAVENOUS
  Filled 2014-06-04 (×2): qty 2
  Filled 2014-06-04 (×5): qty 0.96

## 2014-06-04 MED ORDER — METHYLPREDNISOLONE SODIUM SUCC 125 MG IJ SOLR
125.0000 mg | Freq: Once | INTRAMUSCULAR | Status: AC
  Administered 2014-06-04: 125 mg via INTRAVENOUS
  Filled 2014-06-04: qty 2

## 2014-06-04 NOTE — Progress Notes (Signed)
Patient ID: Colin Lee, male   DOB: 1960/07/25, 54 y.o.   MRN: [id number]         [facility] for Infectious Disease    Date of Admission:  05/30/2014     Principal Problem:   Pleural effusion exudative Active Problems:   Hypertension   Chest pain   Pulmonary infiltrate   Low grade fever   Myalgia   Arthralgia   Normocytic anemia   . aspirin EC  325 mg Oral Daily  . benzonatate  100 mg Oral TID  . enoxaparin (LOVENOX) injection  60 mg Subcutaneous Q24H  . losartan  50 mg Oral Daily  . methylPREDNISolone (SOLU-MEDROL) injection  60 mg Intravenous Q12H  . metoprolol tartrate  12.5 mg Oral BID  . pantoprazole  40 mg Oral Daily  . polyethylene glycol  17 g Oral Daily  . rOPINIRole  0.25 mg Oral QHS  . sodium chloride  3 mL Intravenous Q12H  . sodium chloride  3 mL Intravenous Q12H    Subjective: Overall he feels better but he is still having some mild dyspnea on exertion and substernal pain.  Review of Systems: Pertinent items are noted in HPI.  Past Medical History  Diagnosis Date  . GERD (gastroesophageal reflux disease)   . Hypertension   . Shortness of breath     Starting May 2015  . Headache     stress and tension  . Neuromuscular disorder     restless legs and leg cramps  . Arthritis   . Asthma     History  Substance Use Topics  . Smoking status: Former Smoker -- 1.00 packs/day for 30 years    Types: Cigarettes    Quit date: 04/08/2000  . Smokeless tobacco: Never Used  . Alcohol Use: Yes     Comment: rare    Family History  Problem Relation Age of Onset  . Adopted: Yes   No Known Allergies  OBJECTIVE: Blood pressure 122/80, pulse 92, temperature 98 F (36.7 C), temperature source Oral, resp. rate 18, height 5\' 10"  (1.778 m), weight 266 lb 8.6 oz (120.9 kg), SpO2 98 %. General: He is comfortable and sitting up in a chair Skin: No rash Lungs: Clear. No rub heard. Cor: Regular S1 and S2 with no murmurs or rubs  Lab Results Lab  Results  Component Value Date   WBC 8.7 06/02/2014   HGB 11.6* 06/02/2014   HCT 35.8* 06/02/2014   MCV 81.7 06/02/2014   PLT 393 06/02/2014    Lab Results  Component Value Date   CREATININE 0.91 05/31/2014   BUN 11 05/31/2014   NA 138 05/31/2014   K 3.9 05/31/2014   CL 104 05/31/2014   CO2 25 05/31/2014    Lab Results  Component Value Date   ALT 37 05/31/2014   AST 29 05/31/2014   ALKPHOS 67 05/31/2014   BILITOT 0.7 05/31/2014    ANA 10/2013: Positive at a titer of 1:160 in a speckled pattern 12/2013: Positive at a titer of 1:160 in a speckled pattern 05/2014: Positive at a titer of 1:1280 in a speckled pattern  Microbiology: Recent Results (from the past 240 hour(s))  Body fluid culture with gram stain     Status: None   Collection Time: 06/01/14 10:59 AM  Result Value Ref Range Status   Specimen Description FLUID PLEURAL RIGHT  Final   Special Requests NONE  Final   Gram Stain   Final    FEW WBC PRESENT,BOTH PMN  AND MONONUCLEAR NO ORGANISMS SEEN Performed at [facility]    Culture   Final    NO GROWTH 3 DAYS Performed at [facility]    Report Status 06/04/2014 FINAL  Final  Culture, sputum-assessment     Status: None   Collection Time: 06/01/14  8:48 PM  Result Value Ref Range Status   Specimen Description SPUTUM  Final   Special Requests NONE  Final   Sputum evaluation   Final    THIS SPECIMEN IS ACCEPTABLE. RESPIRATORY CULTURE REPORT TO FOLLOW.   Report Status 06/01/2014 FINAL  Final  Culture, respiratory (NON-Expectorated)     Status: None (Preliminary result)   Collection Time: 06/01/14  8:48 PM  Result Value Ref Range Status   Specimen Description SPUTUM  Final   Special Requests NONE  Final   Gram Stain   Final    MODERATE WBC PRESENT,BOTH PMN AND MONONUCLEAR RARE SQUAMOUS EPITHELIAL CELLS PRESENT FEW GRAM POSITIVE COCCI IN PAIRS IN CLUSTERS Performed at [facility]    Culture   Final    NORMAL OROPHARYNGEAL  FLORA Performed at [facility]    Report Status PENDING  Incomplete    Assessment: I do not think that he has tuberculosis or other active infection. 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  Plan: 1. Recommend trial of steroids 2. I will follow-up on Monday. Please call me for any infectious disease questions this weekend  Michel Bickers, MD [facility] for [place] [phone] pager   [phone] cell 06/04/2014, 11:48 AM

## 2014-06-04 NOTE — Consult Note (Signed)
Admit date: 05/30/2014 Referring Physician  Dr. Wynelle Cleveland Primary Physician ROBERTSON, Thomasena Edis, PA-C Primary Cardiologist  Dr. Radford Pax Reason for Consultation  Chest pain  HPI: 54 year old male admitted with exertional chest pain with recent coronary CT scan that demonstrated possible high-grade stenosis, difficult to interpret, recent nuclear stress test with no ischemia. He also has cough productive of sputum, fever last night which is been periodically occurring at home. There is a small pleural effusion however he continues to have chest tightness on exertion along with dyspnea. His etiology for chest pain has been quite difficult to ascertain and there is still some suspicion that this may be cardiac.  In October 2015 he underwent pleurodesis of left sided effusion after multiple recurrences/drainage. He now has a right-sided effusion along with fevers and pneumonia. Normal echocardiogram. Rheumatology evaluation as outpatient this past summer when and was found to be positive but Dr. Lenna Gilford did not make a diagnosis of autoimmune disease. Dr. [name] with CT surgery has been taking care of him as well. Sedimentation rate is 94, ANAs positive for speckled pattern, bloody effusion with significant amount of lymphocytes noted. Clinical symptoms of fever with effusion suspicious for lupus related effusion and steroids have been started. In the past he has had diffuse muscle and joint aches.  When working, occasionally he will lift heavy objects/boxes or furniture and he notes central chest discomfort when performing these tasks. More recently, when walking down the hallway for instance, he wouldn't be able to make it to the end of the hallway without having exertional chest discomfort.  Infectious disease consultation, Dr. Megan Salon does not believe that he has tuberculosis or other active infection. He believes he may was smoldering autoimmune disease and recommended a trial of steroids.    PMH:    Past Medical History  Diagnosis Date  . GERD (gastroesophageal reflux disease)   . Hypertension   . Shortness of breath     Starting May 2015  . Headache     stress and tension  . Neuromuscular disorder     restless legs and leg cramps  . Arthritis   . Asthma     PSH:   Past Surgical History  Procedure Laterality Date  . No prior surgery    . Thoracentesis Left 2015  . Colonoscopy w/ biopsies and polypectomy  2014    benign  . Video assisted thoracoscopy Left 02/01/2014    Procedure: VIDEO ASSISTED THORACOSCOPY;  Surgeon: Grace Isaac, MD;  Location: [place];  Service: Thoracic;  Laterality: Left;  . Pleural effusion drainage Left 02/01/2014    Procedure: DRAINAGE OF PLEURAL EFFUSION;  Surgeon: Grace Isaac, MD;  Location: [place];  Service: Thoracic;  Laterality: Left;  . Video bronchoscopy N/A 02/01/2014    Procedure: VIDEO BRONCHOSCOPY;  Surgeon: Grace Isaac, MD;  Location: [place];  Service: Thoracic;  Laterality: N/A;  . Pleural biopsy Left 02/01/2014    Procedure: PLEURAL BIOPSY;  Surgeon: Grace Isaac, MD;  Location: [place];  Service: Thoracic;  Laterality: Left;  . Chest tube insertion Left 02/01/2014    Procedure: INSERTION PLEURAL DRAINAGE CATHETER;  Surgeon: Grace Isaac, MD;  Location: [place];  Service: Thoracic;  Laterality: Left;  . [place] pleurodesis Left 02/01/2014    Procedure: TALC PLEURADESIS;  Surgeon: Grace Isaac, MD;  Location: [place];  Service: Thoracic;  Laterality: Left;  . Removal of pleural drainage catheter Left 02/16/2014    Procedure: REMOVAL OF PLEURAL DRAINAGE CATHETER;  Surgeon: Grace Isaac, MD;  Location: [place];  Service: Thoracic;  Laterality: Left;   Allergies:  Review of patient's allergies indicates no known allergies. Prior to Admit Meds:   Prior to Admission medications   Medication Sig Start Date End Date Taking? Authorizing Provider  albuterol (PROVENTIL HFA;VENTOLIN HFA) 108 (90 BASE) MCG/ACT inhaler Inhale  2 puffs into the lungs every 6 (six) hours as needed for wheezing or shortness of breath.   Yes Historical Provider, MD  aspirin EC 81 MG tablet Take 81 mg by mouth daily.   Yes Historical Provider, MD  ibuprofen (ADVIL,MOTRIN) 800 MG tablet Take 800 mg by mouth every 8 (eight) hours as needed for mild pain or moderate pain.    Yes Historical Provider, MD  losartan (COZAAR) 50 MG tablet Take 50 mg by mouth daily.   Yes Historical Provider, MD  Multiple Vitamins-Minerals (CENTRUM ADULTS PO) Take 1 tablet by mouth daily.   Yes Historical Provider, MD  omeprazole (PRILOSEC) 20 MG capsule Take 20 mg by mouth daily.   Yes Historical Provider, MD  polyethylene glycol (MIRALAX / GLYCOLAX) packet Take 17 g by mouth daily. Patient taking differently: Take 17 g by mouth daily as needed for mild constipation or moderate constipation.  10/30/13  Yes Samuella Cota, MD  rOPINIRole (REQUIP) 0.25 MG tablet Take 1 tablet (0.25 mg total) by mouth at bedtime. 10/11/13  Yes Kathie Dike, MD   Fam HX:    Family History  Problem Relation Age of Onset  . Adopted: Yes   Social HX:    History   Social History  . Marital Status: Married    Spouse Name: N/A  . Number of Children: N/A  . Years of Education: N/A   Occupational History  . Not on file.   Social History Main Topics  . Smoking status: Former Smoker -- 1.00 packs/day for 30 years    Types: Cigarettes    Quit date: 04/08/2000  . Smokeless tobacco: Never Used  . Alcohol Use: Yes     Comment: rare  . Drug Use: No  . Sexual Activity: Not on file   Other Topics Concern  . Not on file   Social History Narrative     ROS: No syncope, no bleeding, no rashes, no marked joint effusions. All 11 ROS were addressed and are negative except what is stated in the HPI   Physical Exam: Blood pressure 121/74, pulse 88, temperature 98 F (36.7 C), temperature source Oral, resp. rate 18, height '5\' 10"'  (1.778 m), weight 266 lb 8.6 oz (120.9 kg), SpO2 98  %.   General: Well developed, well nourished, in no acute distress Head: Eyes PERRLA, No xanthomas.   Normal cephalic and atramatic  Lungs:   Clear bilaterally to auscultation and percussion. Normal respiratory effort. No wheezes, no rales. Mild decreased breath sounds right base Heart:   HRRR S1 S2 Pulses are 2+ & equal. No murmur, rubs, gallops.  No carotid bruit. No JVD.  No abdominal bruits.  Abdomen: Bowel sounds are positive, abdomen soft and non-tender without masses. No hepatosplenomegaly. Obese Msk:  Back normal. Normal strength and tone for age. Extremities:  No clubbing, cyanosis or edema.  DP +1 Neuro: Alert and oriented X 3, non-focal, MAE x 4 GU: Deferred Rectal: Deferred Psych:  Good affect, responds appropriately      Labs: Lab Results  Component Value Date   WBC 8.7 06/02/2014   HGB 11.6* 06/02/2014   HCT 35.8* 06/02/2014  MCV 81.7 06/02/2014   PLT 393 06/02/2014     Recent Labs Lab 05/31/14 0619  NA 138  K 3.9  CL 104  CO2 25  BUN 11  CREATININE 0.91  CALCIUM 8.4  PROT 6.9  BILITOT 0.7  ALKPHOS 67  ALT 37  AST 29  GLUCOSE 87   No results for input(s): CKTOTAL, CKMB, TROPONINI in the last 72 hours. Lab Results  Component Value Date   CHOL 145 12/29/2013   HDL 25* 12/29/2013   LDLCALC 94 12/29/2013   TRIG 129 12/29/2013   Lab Results  Component Value Date   DDIMER 4.97* 05/30/2014     Radiology:  Dg Chest 2 View  06/03/2014   CLINICAL DATA:  54 year old male with right pleural effusion status post thoracentesis yesterday. Initial encounter.  EXAM: CHEST  2 VIEW  COMPARISON:  Chest CT 06/02/2014 and earlier.  FINDINGS: Small bilateral pleural effusions have not significantly changed since January. The right is larger. No pneumothorax identified. Stable cardiac size and mediastinal contours. Stable streaky perihilar and basilar pulmonary opacity. No interval areas of worsening ventilation. Bulky first rib costochondral calcification re -  identified. Visualized tracheal air column is within normal limits. No acute osseous abnormality identified.  IMPRESSION: Small bilateral pleural effusions. No pneumothorax after thoracentesis. No significant change in ventilation.   Electronically Signed   By: Genevie Ann M.D.   On: 06/03/2014 07:52   Dg Chest Port 1 View  06/04/2014   CLINICAL DATA:  Pleural effusion  EXAM: PORTABLE CHEST - 1 VIEW  COMPARISON:  06/03/2014  FINDINGS: Cardiac shadow is stable. Bilateral pleural effusions are again seen and stable. No new focal infiltrate is noted. No bony abnormality is seen.  IMPRESSION: Stable small effusions bilaterally.   Electronically Signed   By: Inez Catalina M.D.   On: 06/04/2014 08:58   Personally viewed.  EKG:  05/30/14-sinus tachycardia rate 112 with no other abnormalities Personally viewed.   Echocardiogram: 05/26/14 - Left ventricle: The cavity size was normal. Wall thickness was normal. Systolic function was normal. The estimated ejection fraction was in the range of 55% to 60%. Wall motion was normal; there were no regional wall motion abnormalities. Doppler parameters are consistent with abnormal left ventricular relaxation (grade 1 diastolic dysfunction). - Left atrium: The atrium was mildly dilated. - Pulmonary arteries: Systolic pressure was mildly increased.  CT scan 06/02/14 of coronary arteries- IMPRESSION: 1. Coronary calcium score of 236. This was 79 percentile for age and sex matched control.  2. Normal coronary origin. Right dominance.  3. 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 Significant motion artifact in the proximal to mid RCA affecting ability read, however there is suspicion for a significant stenosis.  Ena Dawley  ASSESSMENT/PLAN:    54 year old male with positive ANA speckled pattern, elevated ESR of 94, recurrent pleural effusions, exudative suspicious for autoimmune etiology with recurrent exertional chest discomfort and dyspnea,  abnormal CT scan of coronary arteries with suspicion for significant stenosis in RCA.  1. Exertional angina/dyspnea  - With abnormal CT scan of coronary arteries, I would advocate for diagnostic angiography. Risks and benefits of procedure including stroke, heart attack, death, renal impairment have been discussed. Radial artery approach. Both he and wife are amenable to procedure. We will place him on catheterization board for Monday.   2. Recurrent pleural effusion  - ANA markedly positive, ESR markedly positive - likely autoimmune  - Steroids.  We will continue to follow.  Candee Furbish, MD  06/04/2014  5:10 PM

## 2014-06-04 NOTE — Progress Notes (Addendum)
[facility] Progress Note   Colin Lee HTX:[id number] DOB: May 09, 1960 DOA: 05/30/2014 PCP: Bronson Curb, PA-C  Brief narrative: Colin Lee is a 54 y.o. male admitted with a complaint of chest pain and shortness of breath. He tells me that his pain was present in the center of the chest and mostly occurs when he exerts himself. He discussed this with Dr. [name] last month and was recommended to see cardiology. He followed up with [facility] and underwent a Myoview stress test which was found to be normal. He is currently not having any chest pain and again tells me it is mostly when he exerts himself and lives heavy items.   Subjective: Continues to have cough productive of colored sputum- has not exerted himself yet to know if his chest pain is recurring. Had a fever last night which has been happening at home  Assessment/Plan: Principal Problem:  Chest pain on exertion -Etiology for this is difficult to ascertain-as mentioned above cardiac workup including an echo and stress test were normal -Coronary CT performed -  -he continues to have chest tightness on exertion along with dyspnea today- doubtful that chest pain is related to pleural effusion which is now a small effusion- -cardiology consulted for further recommendations  Active Problems:  Pleural effusion- fevers -He underwent a talc pleurodesis of left-sided effusion in October 2015 after multiple recurrences after drainage  -Now presented with moderate right-sided effusion which along with fevers and pneumonia  -As mentioned above the patient had a normal 2-D echo on 2/18 and therefore effusion is likely not secondary to heart failure -Did have a rheumatology eval as outpatient this past summer when ANA was first found to be postive but per patient, the rheumatologist, Dr Gavin Pound did not make a diagnosis of autoimmune disease  - He has been evaluated by CT surgery and they  have determined that rather than placing a chest tube, a thoracentesis should be performed first - this has been done- fluid is bloody with significant amount of Lymphocytes- have consulted pulmonary to assist with management- - CXR reveals only a small residual effusion -pulmonary asked my to consult ID-  ID doubts TB or any other infectious cause - ANA again positive for speckled pattern at a very high dilution 1:1280 (third positive test since last summer) - ESR 94 - current pleural fluid analysis with numerous lymphocytes and clinical symptom of fever is suspicious for a lupus related effusion- will start steroids - has on and off diffuse muscle and joint aches- has never had joint swelling - RF. CCP Ab normal- culture negative    CAP (community acquired pneumonia)? -Patient admits to having a cough for 2 weeks-also states that he is been having subjective fevers for about 3 weeks -CT scan reveals that he has pneumonia-he has been placed on Levaquin which has been discontinued by psych - symptoms of cough and fevers likely related to above effusion rather than infection   Hypertension -Continue metoprolol and ARB    Code Status: Full code Family Communication:  Disposition Plan:  DVT prophylaxis: Lovenox  Consultants: CT surgery  Procedures:   Antibiotics: Anti-infectives    Start     Dose/Rate Route Frequency Ordered Stop   05/31/14 2200  levofloxacin (LEVAQUIN) IVPB 750 mg  Status:  Discontinued    Comments:  Levaquin 750 mg IV q24h for CrCl > 50 mL/min   750 mg 100 mL/hr over 90 Minutes Intravenous Every 24 hours 05/31/14 0530 06/03/14  1649   05/31/14 0130  levofloxacin (LEVAQUIN) IVPB 750 mg     750 mg 100 mL/hr over 90 Minutes Intravenous  Once 05/31/14 0125 05/31/14 0335         Objective: Filed Weights   05/31/14 2229 06/02/14 2057 06/03/14 2100  Weight: 122.063 kg (269 lb 1.6 oz) 120.657 kg (266 lb) 120.9 kg (266 lb 8.6 oz)    Intake/Output Summary  (Last 24 hours) at 06/04/14 1638 Last data filed at 06/04/14 1300  Gross per 24 hour  Intake    800 ml  Output      0 ml  Net    800 ml     Vitals Filed Vitals:   06/03/14 1001 06/03/14 2100 06/04/14 0500 06/04/14 1000  BP: 135/84 129/72 113/76 122/80  Pulse: 101 99 86 92  Temp: 99.7 F (37.6 C) 99.1 F (37.3 C) 98.6 F (37 C) 98 F (36.7 C)  TempSrc: Oral Oral Oral Oral  Resp: '18 18 18 18  ' Height:      Weight:  120.9 kg (266 lb 8.6 oz)    SpO2: 96% 96% 98% 98%    Exam: General: Awake alert oriented 3, No acute respiratory distress Lungs: Clear to auscultation bilaterally without wheezes or crackles Cardiovascular: Regular rate and rhythm without murmur gallop or rub normal S1 and S2 Abdomen: Nontender, nondistended, soft, bowel sounds positive, no rebound, no ascites, no appreciable mass Extremities: No significant cyanosis, clubbing, or edema bilateral lower extremities  Data Reviewed: Basic Metabolic Panel:  Recent Labs Lab 05/30/14 2005 05/31/14 0619  NA 137 138  K 4.1 3.9  CL 103 104  CO2 27 25  GLUCOSE 107* 87  BUN 11 11  CREATININE 0.99 0.91  CALCIUM 8.7 8.4   Liver Function Tests:  Recent Labs Lab 05/31/14 0619  AST 29  ALT 37  ALKPHOS 67  BILITOT 0.7  PROT 6.9  ALBUMIN 2.8*   No results for input(s): LIPASE, AMYLASE in the last 168 hours. No results for input(s): AMMONIA in the last 168 hours. CBC:  Recent Labs Lab 05/30/14 2005 05/31/14 0618 05/31/14 0619 06/01/14 0605 06/02/14 0547  WBC 10.7* 8.2 7.9 7.4 8.7  NEUTROABS 7.8*  --  5.0  --   --   HGB 11.1* 10.6* 10.7* 11.1* 11.6*  HCT 33.8* 32.5* 32.9* 33.7* 35.8*  MCV 81.8 79.9 79.9 79.7 81.7  PLT 396 340 325 396 393   Cardiac Enzymes:  Recent Labs Lab 05/30/14 2005 05/31/14 0619 05/31/14 1155  TROPONINI 0.04* 0.03  0.03 <0.03   BNP (last 3 results)  Recent Labs  05/30/14 2150  BNP 92.5    ProBNP (last 3 results)  Recent Labs  10/06/13 1942  PROBNP 79.8     CBG:  Recent Labs Lab 06/01/14 0756 06/01/14 1220 06/01/14 1712 06/02/14 0008 06/02/14 0611  GLUCAP 105* 101* 100* 95 96    Recent Results (from the past 240 hour(s))  Body fluid culture with gram stain     Status: None   Collection Time: 06/01/14 10:59 AM  Result Value Ref Range Status   Specimen Description FLUID PLEURAL RIGHT  Final   Special Requests NONE  Final   Gram Stain   Final    FEW WBC PRESENT,BOTH PMN AND MONONUCLEAR NO ORGANISMS SEEN Performed at [facility]    Culture   Final    NO GROWTH 3 DAYS Performed at [facility]    Report Status 06/04/2014 FINAL  Final  Culture, sputum-assessment  Status: None   Collection Time: 06/01/14  8:48 PM  Result Value Ref Range Status   Specimen Description SPUTUM  Final   Special Requests NONE  Final   Sputum evaluation   Final    THIS SPECIMEN IS ACCEPTABLE. RESPIRATORY CULTURE REPORT TO FOLLOW.   Report Status 06/01/2014 FINAL  Final  Culture, respiratory (NON-Expectorated)     Status: None   Collection Time: 06/01/14  8:48 PM  Result Value Ref Range Status   Specimen Description SPUTUM  Final   Special Requests NONE  Final   Gram Stain   Final    MODERATE WBC PRESENT,BOTH PMN AND MONONUCLEAR RARE SQUAMOUS EPITHELIAL CELLS PRESENT FEW GRAM POSITIVE COCCI IN PAIRS IN CLUSTERS Performed at [facility]    Culture   Final    NORMAL OROPHARYNGEAL FLORA Performed at [facility]    Report Status 06/04/2014 FINAL  Final     Studies:  Recent x-ray studies have been reviewed in detail by the Attending Physician  Scheduled Meds:  Scheduled Meds: . aspirin EC  325 mg Oral Daily  . benzonatate  100 mg Oral TID  . enoxaparin (LOVENOX) injection  60 mg Subcutaneous Q24H  . losartan  50 mg Oral Daily  . methylPREDNISolone (SOLU-MEDROL) injection  60 mg Intravenous Q12H  . metoprolol tartrate  12.5 mg Oral BID  . pantoprazole  40 mg Oral Daily  . polyethylene glycol  17  g Oral Daily  . rOPINIRole  0.25 mg Oral QHS  . sodium chloride  3 mL Intravenous Q12H  . sodium chloride  3 mL Intravenous Q12H   Continuous Infusions:   Time spent on care of this patient: 35 minutes   [place], MD 06/04/2014, 4:38 PM  LOS: 4 days   [facility] Office  [phone] Pager - Text Page per [email or web address]  If 7PM-7AM, please contact night-coverage [email or web address]

## 2014-06-04 NOTE — Progress Notes (Signed)
Subjective: Pt improved overnight, no significant chest discomfort at this time  Objective: Vital signs in last 24 hours: Blood pressure 122/80, pulse 92, temperature 98 F (36.7 C), temperature source Oral, resp. rate 18, height $RemoveBe'5\' 10"'[id number]$  (1.778 m), weight 120.9 kg (266 lb 8.6 oz), SpO2 98 %.  Intake/Output from previous day: 02/26 0701 - 02/27 0700 In: 920 [P.O.:920] Out: -    Physical Exam:   ow male in nad Nose without purulence or d/c noted Neck without LN or TMG Chest with clear bs, no wheezing Cor with rrr LE without significant edema Alert and oriented, moves all 4.    Lab Results:  Recent Labs  06/02/14 0547  WBC 8.7  HGB 11.6*  HCT 35.8*  PLT 393   BMET No results for input(s): NA, K, CL, CO2, GLUCOSE, BUN, CREATININE, CALCIUM in the last 72 hours.  Studies/Results: Dg Chest 2 View  06/03/2014   CLINICAL DATA:  54 year old male with right pleural effusion status post thoracentesis yesterday. Initial encounter.  EXAM: CHEST  2 VIEW  COMPARISON:  Chest CT 06/02/2014 and earlier.  FINDINGS: Small bilateral pleural effusions have not significantly changed since January. The right is larger. No pneumothorax identified. Stable cardiac size and mediastinal contours. Stable streaky perihilar and basilar pulmonary opacity. No interval areas of worsening ventilation. Bulky first rib costochondral calcification re - identified. Visualized tracheal air column is within normal limits. No acute osseous abnormality identified.  IMPRESSION: Small bilateral pleural effusions. No pneumothorax after thoracentesis. No significant change in ventilation.   Electronically Signed   By: Genevie Ann M.D.   On: 06/03/2014 07:52   Ct Coronary Morp W/cta Cor W/score W/ca W/cm &/or Wo/cm  06/02/2014   ADDENDUM REPORT: 06/02/2014 20:52  CLINICAL DATA:  Chest pain  EXAM: Cardiac/Coronary  CT  TECHNIQUE: The patient was scanned on a Philips 256 scanner.  FINDINGS: A 120 kV prospective scan was triggered  in the descending thoracic aorta at 111 HU's. Axial non-contrast 3 mm slices were carried out through the heart. The data set was analyzed on a dedicated work station and scored using the [place]. Gantry rotation speed was 270 msecs and collimation was .9 mm. No beta blockade and 0.4 mg of sl NTG was given. The 3D data set was reconstructed in 5% intervals of the 67-82 % of the R-R cycle. Diastolic phases were analyzed on a dedicated work station using MPR, MIP and VRT modes. The patient received 80 cc of contrast.  Aorta:  Aortic Valve:  Coronary Arteries: Originating in normal position. Right dominance.  RCA is a very large dominant vessel that gives rise to PDA and PLA arteries. There is mild calcified plaque in the proximal portion associated with 25-50% stenosis. Proximal to mid portion is affected by motion and accurate stenosis is not possible. Mid and distal RCA has no significant stenosis.  Left main is a large vessel that has mild mixed, predominantly calcified plaque in its proximal portion associated with 25-50% stenosis.  LAD is a large vessel that has mild calcified plaque in its proximal segment associated with 25-50% stenosis and minimal plaque in the mid segment with 0-25% stenosis. LAD gives rise to two diagonal branches with no significant stenosis.  LCX artery is a medium size non-dominant vessel that gives rise to one obtuse marginal branch. There is only minimal calcified plaque in mid LCX artery associated with 0-25% stenosis.  IMPRESSION: 1. Coronary calcium score of 236. This was 26 percentile for age and sex  matched control.  2.  Normal coronary origin.  Right dominance.  3. 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 Significant motion artifact in the proximal to mid RCA affecting ability read, however there is suspicion for a significant stenosis.  Ena Dawley   Electronically Signed   By: Ena Dawley   On: 06/02/2014 20:52   06/02/2014   EXAM: OVER-READ INTERPRETATION   CT CHEST  The following report is an over-read performed by radiologist Dr. Forest Gleason [facility] on 06/02/2014. This over-read does not include interpretation of cardiac or coronary anatomy or pathology. The coronary calcium score/coronary CTA interpretation by the cardiologist is attached.  COMPARISON:  Chest radiograph above 06/02/2014. Chest CT of the 2 days prior.  FINDINGS: Mediastinum/Nodes: Mediastinal adenopathy, as detailed on recent diagnostic CT. A right paratracheal node measures 1.3 cm on image 10 of series 504.  Subcarinal node measures 1.3 cm on image 24.  1.6 cm right hilar node is mildly enlarged.  Aortic atherosclerosis, without aneurysm.  Lungs/Pleura: Decrease in size of a small right pleural effusion. Trace left pleural fluid or thickening. Circumferential right-sided pleural thickening is also identified and is mild.  Subpleural left upper lobe ground-glass opacity is improved. Scattered areas of subsegmental atelectasis in both lungs.  Upper abdomen: Hepatic steatosis. Central left hepatic lobe cyst. Cholelithiasis.  Musculoskeletal: No acute osseous abnormality.  IMPRESSION: 1. Thoracic adenopathy, as detailed on 05/31/2014 CT. This could be reactive or related to congestive heart failure. Consider followup chest CT at 3 months to confirm stability. 2. Improved left upper lobe ground-glass opacity, possibly related to pulmonary edema. 3. Decrease in small right pleural effusion with bilateral pleural thickening.  Electronically Signed: By: Abigail Miyamoto M.D. On: 06/02/2014 14:52   Dg Chest Port 1 View  06/04/2014   CLINICAL DATA:  Pleural effusion  EXAM: PORTABLE CHEST - 1 VIEW  COMPARISON:  06/03/2014  FINDINGS: Cardiac shadow is stable. Bilateral pleural effusions are again seen and stable. No new focal infiltrate is noted. No bony abnormality is seen.  IMPRESSION: Stable small effusions bilaterally.   Electronically Signed   By: Inez Catalina M.D.   On: 06/04/2014 08:58     Assessment/Plan:  1) Right pleural effusion unknown origin.  It is small on CXR this am.  His fluid is a lymphocyte predominant exudate, with cultures and cytology being negative.  His ADA on fluid is still pending.  It is interesting to note that his ANA and ESR are very elevated, suspicious for some type of autoimmune disease.  The pt is in no distress, and his effusion remains small.  Would continue to hold steroids for now.   -f/u pleural fluid ADA -will need rheum. F/u as outpt.   Will check again on monday   Kathee Delton, M.D. 06/04/2014, 11:10 AM

## 2014-06-05 DIAGNOSIS — R0789 Other chest pain: Secondary | ICD-10-CM

## 2014-06-05 DIAGNOSIS — R0600 Dyspnea, unspecified: Secondary | ICD-10-CM

## 2014-06-05 LAB — SURGICAL PCR SCREEN
MRSA, PCR: NEGATIVE
Staphylococcus aureus: NEGATIVE

## 2014-06-05 MED ORDER — ASPIRIN 81 MG PO CHEW
81.0000 mg | CHEWABLE_TABLET | ORAL | Status: AC
  Administered 2014-06-06: 81 mg via ORAL
  Filled 2014-06-05: qty 1

## 2014-06-05 MED ORDER — SODIUM CHLORIDE 0.9 % IV SOLN
1.0000 mL/kg/h | INTRAVENOUS | Status: DC
  Administered 2014-06-06 – 2014-06-07 (×3): 1 mL/kg/h via INTRAVENOUS

## 2014-06-05 MED ORDER — SODIUM CHLORIDE 0.9 % IJ SOLN
3.0000 mL | Freq: Two times a day (BID) | INTRAMUSCULAR | Status: DC
  Administered 2014-06-06: 3 mL via INTRAVENOUS

## 2014-06-05 MED ORDER — SODIUM CHLORIDE 0.9 % IJ SOLN: 3.0000 mL | INTRAMUSCULAR | Status: DC | PRN

## 2014-06-05 MED ORDER — ASPIRIN EC 325 MG PO TBEC
325.0000 mg | DELAYED_RELEASE_TABLET | Freq: Every day | ORAL | Status: DC
  Filled 2014-06-05: qty 1

## 2014-06-05 MED ORDER — SODIUM CHLORIDE 0.9 % IV SOLN: 250.0000 mL | INTRAVENOUS | Status: DC | PRN

## 2014-06-05 MED ORDER — ENOXAPARIN SODIUM 60 MG/0.6ML ~~LOC~~ SOLN
60.0000 mg | SUBCUTANEOUS | Status: DC
Start: 2014-06-06 — End: 2014-06-07
  Administered 2014-06-06: 60 mg via SUBCUTANEOUS
  Filled 2014-06-05 (×2): qty 0.6

## 2014-06-05 NOTE — Progress Notes (Signed)
Patient watched CCTV video with wife.

## 2014-06-05 NOTE — Progress Notes (Signed)
    Subjective:  CP when washing hair.   Objective:  Vital Signs in the last 24 hours: Temp:  [97.8 F (36.6 C)-98.5 F (36.9 C)] 98.5 F (36.9 C) (02/28 1000) Pulse Rate:  [87-93] 87 (02/28 1000) Resp:  [16-18] 18 (02/28 1000) BP: (117-137)/(62-86) 137/86 mmHg (02/28 1000) SpO2:  [91 %-100 %] 100 % (02/28 1000) Weight:  [273 lb (123.832 kg)] 273 lb (123.832 kg) (02/27 2209)  Intake/Output from previous day: 02/27 0701 - 02/28 0700 In: 840 [P.O.:840] Out: -    Physical Exam: General: Well developed, well nourished, in no acute distress. Head:  Normocephalic and atraumatic. Lungs: Clear to auscultation and percussion. Heart: Normal S1 and S2.  No murmur, rubs or gallops.  Abdomen: soft, non-tender, positive bowel sounds. Obese Extremities: No clubbing or cyanosis. No edema. Neurologic: Alert and oriented x 3.    Scheduled Meds: . aspirin EC  325 mg Oral Daily  . benzonatate  100 mg Oral TID  . enoxaparin (LOVENOX) injection  60 mg Subcutaneous Q24H  . losartan  50 mg Oral Daily  . methylPREDNISolone (SOLU-MEDROL) injection  60 mg Intravenous Q12H  . metoprolol tartrate  12.5 mg Oral BID  . pantoprazole  40 mg Oral Daily  . polyethylene glycol  17 g Oral Daily  . rOPINIRole  0.25 mg Oral QHS  . sodium chloride  3 mL Intravenous Q12H  . sodium chloride  3 mL Intravenous Q12H   Continuous Infusions:  PRN Meds:.acetaminophen **OR** acetaminophen, chlorpheniramine-HYDROcodone, ondansetron **OR** ondansetron (ZOFRAN) IV  Imaging: Dg Chest Port 1 View  06/04/2014   CLINICAL DATA:  Pleural effusion  EXAM: PORTABLE CHEST - 1 VIEW  COMPARISON:  06/03/2014  FINDINGS: Cardiac shadow is stable. Bilateral pleural effusions are again seen and stable. No new focal infiltrate is noted. No bony abnormality is seen.  IMPRESSION: Stable small effusions bilaterally.   Electronically Signed   By: Inez Catalina M.D.   On: 06/04/2014 08:58     EKG:  Sinus tach 112. Continued on tele.    Cardiac Studies:  Normal EF  Assessment/Plan:  Principal Problem:   Pleural effusion exudative Active Problems:   Hypertension   Pulmonary infiltrate   Low grade fever   Myalgia   Arthralgia   Normocytic anemia   Angina pectoris  54 year old male with positive ANA speckled pattern, elevated ESR of 94, recurrent pleural effusions, exudative suspicious for autoimmune etiology with recurrent exertional chest discomfort and dyspnea, abnormal CT scan of coronary arteries with suspicion for significant stenosis in RCA.  1. Exertional angina/dyspnea  - Had another bout this AM when washing hair, ?cardiac - With abnormal CT scan of coronary arteries, I would advocate for diagnostic angiography. Risks and benefits of procedure including stroke, heart attack, death, renal impairment have been discussed. Radial artery approach. Both he and wife are amenable to procedure. We will place him on catheterization board for Monday.   - Orders placed. Lab orders placed.   2. Recurrent pleural effusion - ANA markedly positive, ESR markedly positive - likely autoimmune - Steroids.  We will continue to follow.  SKAINS, [place] 06/05/2014, 11:37 AM

## 2014-06-05 NOTE — Progress Notes (Signed)
[facility] Progress Note   Colin Lee YQI:[id number] DOB: 1960/09/11 DOA: 05/30/2014 PCP: Bronson Curb, PA-C  Brief narrative: Colin Lee is a 54 y.o. male admitted with a complaint of chest pain and shortness of breath. He tells me that his pain was present in the center of the chest and mostly occurs when he exerts himself. He discussed this with Dr. [name] last month and was recommended to see cardiology. He followed up with [facility] and underwent a Myoview stress test which was found to be normal. He is currently not having any chest pain and again tells me it is mostly when he exerts himself and lives heavy items.   Subjective: Cough is improved but his chest pain is severe and recurred when he was in the bathroom today  Assessment/Plan: Principal Problem:  Chest pain on exertion -Etiology for this is difficult to ascertain-as mentioned above cardiac workup including an echo and stress test were normal -Coronary CT performed -  -he continues to have chest tightness on exertion along with dyspnea today- doubtful that chest pain is related to pleural effusion which is now a small effusion- -cardiology consulted for further recommendations- will have cath tomorrow  Active Problems:  Pleural effusion- fevers -He underwent a talc pleurodesis of left-sided effusion in October 2015 after multiple recurrences after drainage  -Now presented with moderate right-sided effusion which along with fevers and pneumonia  -As mentioned above the patient had a normal 2-D echo on 2/18 and therefore effusion is likely not secondary to heart failure -Did have a rheumatology eval as outpatient this past summer when ANA was first found to be postive but per patient, the rheumatologist, Dr Gavin Pound did not make a diagnosis of autoimmune disease  - He has been evaluated by CT surgery and they have determined that rather than placing a chest tube, a  thoracentesis should be performed first - this has been done- fluid is bloody with significant amount of Lymphocytes- have consulted pulmonary to assist with management- - CXR reveals only a small residual effusion -pulmonary asked my to consult ID-  ID doubts TB or any other infectious cause - ANA again positive for speckled pattern at a very high dilution 1:1280 (third positive test since last summer) - ESR 94 - current pleural fluid analysis with numerous lymphocytes and clinical symptom of fever is suspicious for a lupus related effusion- will start steroids - has on and off diffuse muscle and joint aches- has never had joint swelling - RF. CCP Ab normal- culture negative    CAP (community acquired pneumonia)? -Patient admits to having a cough for 2 weeks-also states that he is been having subjective fevers for about 3 weeks -CT scan reveals that he has pneumonia-he has been placed on Levaquin which has been discontinued by psych - symptoms of cough and fevers likely related to above effusion rather than infection   Hypertension -Continue metoprolol and ARB    Code Status: Full code Family Communication:  Disposition Plan:  DVT prophylaxis: Lovenox  Consultants: CT surgery  Procedures:   Antibiotics: Anti-infectives    Start     Dose/Rate Route Frequency Ordered Stop   05/31/14 2200  levofloxacin (LEVAQUIN) IVPB 750 mg  Status:  Discontinued    Comments:  Levaquin 750 mg IV q24h for CrCl > 50 mL/min   750 mg 100 mL/hr over 90 Minutes Intravenous Every 24 hours 05/31/14 0530 06/03/14 1649   05/31/14 0130  levofloxacin (LEVAQUIN) IVPB 750  mg     750 mg 100 mL/hr over 90 Minutes Intravenous  Once 05/31/14 0125 05/31/14 0335         Objective: Filed Weights   06/02/14 2057 06/03/14 2100 06/04/14 2209  Weight: 120.657 kg (266 lb) 120.9 kg (266 lb 8.6 oz) 123.832 kg (273 lb)    Intake/Output Summary (Last 24 hours) at 06/05/14 1131 Last data filed at 06/05/14  0900  Gross per 24 hour  Intake    840 ml  Output      0 ml  Net    840 ml     Vitals Filed Vitals:   06/04/14 1655 06/04/14 2209 06/05/14 0553 06/05/14 1000  BP: 121/74 119/62 117/62 137/86  Pulse: 88 93 92 87  Temp: 98 F (36.7 C) 98.4 F (36.9 C) 97.8 F (36.6 C) 98.5 F (36.9 C)  TempSrc: Oral Oral Oral Oral  Resp: _0 Height:      Weight:  123.832 kg (273 lb)    SpO2: 98% 91% 92% 100%    Exam: General: Awake alert oriented 3, No acute respiratory distress Lungs: Clear to auscultation bilaterally without wheezes or crackles Cardiovascular: Regular rate and rhythm without murmur gallop or rub normal S1 and S2 Abdomen: Nontender, nondistended, soft, bowel sounds positive, no rebound, no ascites, no appreciable mass Extremities: No significant cyanosis, clubbing, or edema bilateral lower extremities  Data Reviewed: Basic Metabolic Panel:  Recent Labs Lab 05/30/14 2005 05/31/14 0619  NA 137 138  K 4.1 3.9  CL 103 104  CO2 27 25  GLUCOSE 107* 87  BUN 11 11  CREATININE 0.99 0.91  CALCIUM 8.7 8.4   Liver Function Tests:  Recent Labs Lab 05/31/14 0619  AST 29  ALT 37  ALKPHOS 67  BILITOT 0.7  PROT 6.9  ALBUMIN 2.8*   No results for input(s): LIPASE, AMYLASE in the last 168 hours. No results for input(s): AMMONIA in the last 168 hours. CBC:  Recent Labs Lab 05/30/14 2005 05/31/14 0618 05/31/14 0619 06/01/14 0605 06/02/14 0547  WBC 10.7* 8.2 7.9 7.4 8.7  NEUTROABS 7.8*  --  5.0  --   --   HGB 11.1* 10.6* 10.7* 11.1* 11.6*  HCT 33.8* 32.5* 32.9* 33.7* 35.8*  MCV 81.8 79.9 79.9 79.7 81.7  PLT 396 340 325 396 393   Cardiac Enzymes:  Recent Labs Lab 05/30/14 2005 05/31/14 0619 05/31/14 1155  TROPONINI 0.04* 0.03  0.03 <0.03   BNP (last 3 results)  Recent Labs  05/30/14 2150  BNP 92.5    ProBNP (last 3 results)  Recent Labs  10/06/13 1942  PROBNP 79.8    CBG:  Recent Labs Lab 06/01/14 0756 06/01/14 1220  06/01/14 1712 06/02/14 0008 06/02/14 0611  GLUCAP 105* 101* 100* 95 96    Recent Results (from the past 240 hour(s))  Body fluid culture with gram stain     Status: None   Collection Time: 06/01/14 10:59 AM  Result Value Ref Range Status   Specimen Description FLUID PLEURAL RIGHT  Final   Special Requests NONE  Final   Gram Stain   Final    FEW WBC PRESENT,BOTH PMN AND MONONUCLEAR NO ORGANISMS SEEN Performed at [facility]    Culture   Final    NO GROWTH 3 DAYS Performed at [facility]    Report Status 06/04/2014 FINAL  Final  Culture, sputum-assessment     Status: None   Collection Time: 06/01/14  8:48 PM  Result Value Ref Range Status   Specimen Description SPUTUM  Final   Special Requests NONE  Final   Sputum evaluation   Final    THIS SPECIMEN IS ACCEPTABLE. RESPIRATORY CULTURE REPORT TO FOLLOW.   Report Status 06/01/2014 FINAL  Final  Culture, respiratory (NON-Expectorated)     Status: None   Collection Time: 06/01/14  8:48 PM  Result Value Ref Range Status   Specimen Description SPUTUM  Final   Special Requests NONE  Final   Gram Stain   Final    MODERATE WBC PRESENT,BOTH PMN AND MONONUCLEAR RARE SQUAMOUS EPITHELIAL CELLS PRESENT FEW GRAM POSITIVE COCCI IN PAIRS IN CLUSTERS Performed at [facility]    Culture   Final    NORMAL OROPHARYNGEAL FLORA Performed at [facility]    Report Status 06/04/2014 FINAL  Final     Studies:  Recent x-ray studies have been reviewed in detail by the Attending Physician  Scheduled Meds:  Scheduled Meds: . aspirin EC  325 mg Oral Daily  . benzonatate  100 mg Oral TID  . enoxaparin (LOVENOX) injection  60 mg Subcutaneous Q24H  . losartan  50 mg Oral Daily  . methylPREDNISolone (SOLU-MEDROL) injection  60 mg Intravenous Q12H  . metoprolol tartrate  12.5 mg Oral BID  . pantoprazole  40 mg Oral Daily  . polyethylene glycol  17 g Oral Daily  . rOPINIRole  0.25 mg Oral QHS  . sodium  chloride  3 mL Intravenous Q12H  . sodium chloride  3 mL Intravenous Q12H   Continuous Infusions:   Time spent on care of this patient: 35 minutes   Coffeen, MD 06/05/2014, 11:31 AM  LOS: 5 days   [facility] Office  [phone] Pager - Text Page per [email or web address]  If 7PM-7AM, please contact night-coverage [email or web address]

## 2014-06-06 DIAGNOSIS — R072 Precordial pain: Secondary | ICD-10-CM

## 2014-06-06 LAB — PROTIME-INR
INR: 1.07 (ref 0.00–1.49)
Prothrombin Time: 14 seconds (ref 11.6–15.2)

## 2014-06-06 LAB — BASIC METABOLIC PANEL
Anion gap: 8 (ref 5–15)
BUN: 17 mg/dL (ref 6–23)
CO2: 28 mmol/L (ref 19–32)
Calcium: 9.2 mg/dL (ref 8.4–10.5)
Chloride: 100 mmol/L (ref 96–112)
Creatinine, Ser: 0.93 mg/dL (ref 0.50–1.35)
GFR calc Af Amer: >90 mL/min (ref >90)
GFR calc non Af Amer: >90 mL/min (ref >90)
Glucose, Bld: 241 mg/dL — ABNORMAL HIGH (ref 70–99)
Potassium: 4.7 mmol/L (ref 3.5–5.1)
Sodium: 136 mmol/L (ref 135–145)

## 2014-06-06 LAB — CBC
HCT: 36.1 % — ABNORMAL LOW (ref 39.0–52.0)
Hemoglobin: 11.8 g/dL — ABNORMAL LOW (ref 13.0–17.0)
MCH: 27.1 pg (ref 26.0–34.0)
MCHC: 32.7 g/dL (ref 30.0–36.0)
MCV: 82.8 fL (ref 78.0–100.0)
Platelets: 540 10*3/uL — ABNORMAL HIGH (ref 150–400)
RBC: 4.36 MIL/uL (ref 4.22–5.81)
RDW: 13.1 % (ref 11.5–15.5)
WBC: 25.7 10*3/uL — ABNORMAL HIGH (ref 4.0–10.5)

## 2014-06-06 LAB — OTHER BODY FLUID CHEMISTRY

## 2014-06-06 LAB — ANCA TITERS
Atypical P-ANCA titer: <1:20 {titer}
C-ANCA: 1:80 {titer} — ABNORMAL HIGH
P-ANCA: <1:20 {titer}

## 2014-06-06 MED ORDER — PREDNISONE 50 MG PO TABS
60.0000 mg | ORAL_TABLET | Freq: Every day | ORAL | Status: DC
  Filled 2014-06-06 (×2): qty 1

## 2014-06-06 MED ORDER — ATORVASTATIN CALCIUM 40 MG PO TABS
40.0000 mg | ORAL_TABLET | Freq: Every day | ORAL | Status: DC
  Administered 2014-06-06 – 2014-06-08 (×3): 40 mg via ORAL
  Filled 2014-06-06 (×4): qty 1

## 2014-06-06 NOTE — Progress Notes (Signed)
Patient Name: Colin Lee Date of Encounter: 06/06/2014  Primary Cardiologist: Dr. Radford Pax   Principal Problem:   Pleural effusion exudative Active Problems:   Hypertension   Pulmonary infiltrate   Low grade fever   Myalgia   Arthralgia   Normocytic anemia   Angina pectoris    SUBJECTIVE  Denies any CP overnight. No SOB.   CURRENT MEDS . [START ON 06/07/2014] aspirin EC  325 mg Oral Daily  . benzonatate  100 mg Oral TID  . enoxaparin (LOVENOX) injection  60 mg Subcutaneous Q24H  . losartan  50 mg Oral Daily  . methylPREDNISolone (SOLU-MEDROL) injection  60 mg Intravenous Q12H  . metoprolol tartrate  12.5 mg Oral BID  . pantoprazole  40 mg Oral Daily  . polyethylene glycol  17 g Oral Daily  . rOPINIRole  0.25 mg Oral QHS  . sodium chloride  3 mL Intravenous Q12H  . sodium chloride  3 mL Intravenous Q12H  . sodium chloride  3 mL Intravenous Q12H    OBJECTIVE  Filed Vitals:   06/05/14 1000 06/05/14 2126 06/06/14 0549 06/06/14 1006  BP: 137/86 140/77 128/74 146/85  Pulse: 87 94 91 89  Temp: 98.5 F (36.9 C) 97.9 F (36.6 C) 98.6 F (37 C) 98.4 F (36.9 C)  TempSrc: Oral Oral Oral Oral  Resp: _0 Height:      Weight:  170 lb 1.6 oz (77.157 kg)    SpO2: 100% 97% 95% 95%    Intake/Output Summary (Last 24 hours) at 06/06/14 1042 Last data filed at 06/05/14 1300  Gross per 24 hour  Intake    360 ml  Output      0 ml  Net    360 ml   Filed Weights   06/03/14 2100 06/04/14 2209 06/05/14 2126  Weight: 266 lb 8.6 oz (120.9 kg) 273 lb (123.832 kg) 170 lb 1.6 oz (77.157 kg)    PHYSICAL EXAM  General: Pleasant, NAD. Neuro: Alert and oriented X 3. Moves all extremities spontaneously. Psych: Normal affect. HEENT:  Normal  Neck: Supple without bruits or JVD. Lungs:  Resp regular and unlabored, CTA. Heart: RRR no s3, s4, or murmurs. Abdomen: Soft, non-tender, non-distended, BS + x 4.  Extremities: No clubbing, cyanosis or edema. DP/PT/Radials  2+ and equal bilaterally.  Accessory Clinical Findings  CBC  Recent Labs  06/06/14 0431  WBC 25.7*  HGB 11.8*  HCT 36.1*  MCV 82.8  PLT 536*   Basic Metabolic Panel  Recent Labs  06/06/14 0431  NA 136  K 4.7  CL 100  CO2 28  GLUCOSE 241*  BUN 17  CREATININE 0.93  CALCIUM 9.2    TELE NSR with HR 70-80s    ECG  No new EKG  Echocardiogram 05/26/2014  LV EF: 55% -  60%  ------------------------------------------------------------------- Indications:   R07.9 Chest Pain.Marland Kitchen  ------------------------------------------------------------------- History:  PMH: Acquired from the patient and from the patient&'s chart. PMH: History of Pleural Effusion. Risk factors: Former tobacco use. Hypertension. Obese.  ------------------------------------------------------------------- Study Conclusions  - Left ventricle: The cavity size was normal. Wall thickness was normal. Systolic function was normal. The estimated ejection fraction was in the range of 55% to 60%. Wall motion was normal; there were no regional wall motion abnormalities. Doppler parameters are consistent with abnormal left ventricular relaxation (grade 1 diastolic dysfunction). - Left atrium: The atrium was mildly dilated. - Pulmonary arteries: Systolic pressure was mildly increased.  Impressions:  - Technically difficult;  definity used. Normal LV function; grade 1 diastolic dysfunction; trace TR; mildly elevated pulmonary pressure.    Radiology/Studies  Dg Chest 1 View  06/01/2014   CLINICAL DATA:  Status post thoracentesis.  EXAM: CHEST  1 VIEW  COMPARISON:  Chest CT 05/31/2014  FINDINGS: The cardiac silhouette, mediastinal and hilar contours are Stable. There is tortuosity and calcification of the thoracic aorta. Interval decrease and right-sided pleural effusion. No postprocedural pneumothorax is identified.  IMPRESSION: Decrease in right-sided pleural effusion. No  postprocedural pneumothorax.   Electronically Signed   By: Marijo Sanes M.D.   On: 06/01/2014 12:19   Dg Chest 2 View  06/03/2014   CLINICAL DATA:  54 year old male with right pleural effusion status post thoracentesis yesterday. Initial encounter.  EXAM: CHEST  2 VIEW  COMPARISON:  Chest CT 06/02/2014 and earlier.  FINDINGS: Small bilateral pleural effusions have not significantly changed since January. The right is larger. No pneumothorax identified. Stable cardiac size and mediastinal contours. Stable streaky perihilar and basilar pulmonary opacity. No interval areas of worsening ventilation. Bulky first rib costochondral calcification re - identified. Visualized tracheal air column is within normal limits. No acute osseous abnormality identified.  IMPRESSION: Small bilateral pleural effusions. No pneumothorax after thoracentesis. No significant change in ventilation.   Electronically Signed   By: Genevie Ann M.D.   On: 06/03/2014 07:52   Dg Chest 2 View  05/30/2014   CLINICAL DATA:  Left-sided chest pain and shortness of breath for 3 days. Multiple prior left thoracentesis he has over the past 5 years.  EXAM: CHEST  2 VIEW  COMPARISON:  05/03/2014  FINDINGS: Mild cardiac enlargement with increased pulmonary vascularity. No edema or consolidation. Small bilateral pleural effusions with basilar atelectasis. Appearance is similar to prior study. No pneumothorax. Mediastinal contours appear intact.  IMPRESSION: Bilateral pleural effusions with basilar atelectasis. Mild cardiac enlargement and pulmonary vascular congestion.   Electronically Signed   By: Lucienne Capers M.D.   On: 05/30/2014 20:53   Ct Angio Chest Pe W/cm &/or Wo Cm  05/31/2014   CLINICAL DATA:  Chest pain and shortness of breath beginning mid January, worsening since Saturday.  EXAM: CT ANGIOGRAPHY CHEST WITH CONTRAST  TECHNIQUE: Multidetector CT imaging of the chest was performed using the standard protocol during bolus administration of  intravenous contrast. Multiplanar CT image reconstructions and MIPs were obtained to evaluate the vascular anatomy.  CONTRAST:  120m OMNIPAQUE IOHEXOL 350 MG/ML SOLN  COMPARISON:  Chest radiograph May 30, 2014 at 2010 hours and CT of the chest December 27, 2013  FINDINGS: Adequate pulmonary arterial contrast opacification. Main pulmonary artery is not enlarged. No pulmonary embolism to the level of the subsegmental branches though mild respiratory motion degrades sensitivity.  Heart size is mildly enlarged, no pericardial fluid collections. Thoracic aorta is normal in course and caliber with mild calcific atherosclerosis. RIGHT hilar lymphadenopathy measures up to 12 mm short axis. Pre treat tracheal 13 mm short axis lymph node, additional smaller pre VATS vascular and aortopulmonary window lymph nodes.  Moderate RIGHT pleural effusion, small LEFT pleural effusion and thickening. Pulmonary vascular congestion. Patchy ground-glass opacities and interstitial prominence in LEFT upper lobe, axial 39/90. Bilateral lower lobe atelectasis. Patchy enhancing LEFT lower lobe airspace opacity. Tracheobronchial tree is patent and midline. No pneumothorax.  Multiple subcentimeter gallstones without CT findings of acute cholecystitis. 2.5 cm cyst in RIGHT lobe of the liver, liver is otherwise unremarkable.  Review of the MIP images confirms the above findings.  IMPRESSION: No acute  pulmonary embolism on this mild respiratory motion degraded examination.  Mild cardiomegaly and pulmonary vascular congestion. Moderate RIGHT pleural effusion. Patchy LEFT upper lobe airspace opacity could reflect confluent edema though, pneumonia is a concern. Patchy enhancing atelectasis/ pneumonia LEFT lower lobe. Recommend followup imaging after treatment to verify improvement.  Mediastinal lymphadenopathy may be reactive though, recommend close attention on follow-up imaging.   Electronically Signed   By: Elon Alas   On: 05/31/2014  01:03   Ct Coronary Morp W/cta Cor W/score W/ca W/cm &/or Wo/cm  06/02/2014   ADDENDUM REPORT: 06/02/2014 20:52  CLINICAL DATA:  Chest pain  EXAM: Cardiac/Coronary  CT  TECHNIQUE: The patient was scanned on a Philips 256 scanner.  FINDINGS: A 120 kV prospective scan was triggered in the descending thoracic aorta at 111 HU's. Axial non-contrast 3 mm slices were carried out through the heart. The data set was analyzed on a dedicated work station and scored using the [place]. Gantry rotation speed was 270 msecs and collimation was .9 mm. No beta blockade and 0.4 mg of sl NTG was given. The 3D data set was reconstructed in 5% intervals of the 67-82 % of the R-R cycle. Diastolic phases were analyzed on a dedicated work station using MPR, MIP and VRT modes. The patient received 80 cc of contrast.  Aorta:  Aortic Valve:  Coronary Arteries: Originating in normal position. Right dominance.  RCA is a very large dominant vessel that gives rise to PDA and PLA arteries. There is mild calcified plaque in the proximal portion associated with 25-50% stenosis. Proximal to mid portion is affected by motion and accurate stenosis is not possible. Mid and distal RCA has no significant stenosis.  Left main is a large vessel that has mild mixed, predominantly calcified plaque in its proximal portion associated with 25-50% stenosis.  LAD is a large vessel that has mild calcified plaque in its proximal segment associated with 25-50% stenosis and minimal plaque in the mid segment with 0-25% stenosis. LAD gives rise to two diagonal branches with no significant stenosis.  LCX artery is a medium size non-dominant vessel that gives rise to one obtuse marginal branch. There is only minimal calcified plaque in mid LCX artery associated with 0-25% stenosis.  IMPRESSION: 1. Coronary calcium score of 236. This was 88 percentile for age and sex matched control.  2.  Normal coronary origin.  Right dominance.  3. 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 Significant motion artifact in the proximal to mid RCA affecting ability read, however there is suspicion for a significant stenosis.  Ena Dawley   Electronically Signed   By: Ena Dawley   On: 06/02/2014 20:52   06/02/2014   EXAM: OVER-READ INTERPRETATION  CT CHEST  The following report is an over-read performed by radiologist Dr. Forest Gleason [facility] on 06/02/2014. This over-read does not include interpretation of cardiac or coronary anatomy or pathology. The coronary calcium score/coronary CTA interpretation by the cardiologist is attached.  COMPARISON:  Chest radiograph above 06/02/2014. Chest CT of the 2 days prior.  FINDINGS: Mediastinum/Nodes: Mediastinal adenopathy, as detailed on recent diagnostic CT. A right paratracheal node measures 1.3 cm on image 10 of series 504.  Subcarinal node measures 1.3 cm on image 24.  1.6 cm right hilar node is mildly enlarged.  Aortic atherosclerosis, without aneurysm.  Lungs/Pleura: Decrease in size of a small right pleural effusion. Trace left pleural fluid or thickening. Circumferential right-sided pleural thickening is also identified and is mild.  Subpleural left upper lobe ground-glass opacity is improved. Scattered areas of subsegmental atelectasis in both lungs.  Upper abdomen: Hepatic steatosis. Central left hepatic lobe cyst. Cholelithiasis.  Musculoskeletal: No acute osseous abnormality.  IMPRESSION: 1. Thoracic adenopathy, as detailed on 05/31/2014 CT. This could be reactive or related to congestive heart failure. Consider followup chest CT at 3 months to confirm stability. 2. Improved left upper lobe ground-glass opacity, possibly related to pulmonary edema. 3. Decrease in small right pleural effusion with bilateral pleural thickening.  Electronically Signed: By: Abigail Miyamoto M.D. On: 06/02/2014 14:52   Dg Chest Port 1 View  06/04/2014   CLINICAL DATA:  Pleural effusion  EXAM: PORTABLE CHEST - 1 VIEW  COMPARISON:   06/03/2014  FINDINGS: Cardiac shadow is stable. Bilateral pleural effusions are again seen and stable. No new focal infiltrate is noted. No bony abnormality is seen.  IMPRESSION: Stable small effusions bilaterally.   Electronically Signed   By: Inez Catalina M.D.   On: 06/04/2014 08:58   Dg Chest Port 1 View  06/02/2014   CLINICAL DATA:  Pleural effusion.  EXAM: PORTABLE CHEST - 1 VIEW  COMPARISON:  June 01, 2014.  FINDINGS: Stable cardiomediastinal silhouette. No pneumothorax is noted. Bony thorax is intact. Stable mild right pleural effusion is noted. No other significant abnormality is noted.  IMPRESSION: Stable mild right pleural effusion.   Electronically Signed   By: Marijo Conception, M.D.   On: 06/02/2014 09:36   US Thoracentesis Asp Pleural Space W/img Guide  06/01/2014   INDICATION: Symptomatic right sided pleural effusion  EXAM: US THORACENTESIS ASP PLEURAL SPACE W/IMG GUIDE  COMPARISON:  Previous thoracentesis  MEDICATIONS: 10 cc 1% lidocaine  COMPLICATIONS: None immediate  TECHNIQUE: Informed written consent was obtained from the patient after a discussion of the risks, benefits and alternatives to treatment. A timeout was performed prior to the initiation of the procedure.  Initial ultrasound scanning demonstrates a right pleural effusion. The lower chest was prepped and draped in the usual sterile fashion. 1% lidocaine was used for local anesthesia.  Under direct ultrasound guidance, a 19 gauge, 7-cm, Yueh catheter was introduced. An ultrasound image was saved for documentation purposes. the thoracentesis was performed. The catheter was removed and a dressing was applied. The patient tolerated the procedure well without immediate post procedural complication. The patient was escorted to have an upright chest radiograph.  FINDINGS: A total of approximately 420 cc of blood tinged fluid was removed. Requested samples were sent to the laboratory.  IMPRESSION: Successful ultrasound-guided R sided  thoracentesis yielding 420 cc of pleural fluid.  Read by:  Lavonia Drafts [facility]   Electronically Signed   By: Jerilynn Mages.  Shick M.D.   On: 06/01/2014 14:42    ASSESSMENT AND PLAN  54 year old male with positive ANA speckled pattern, elevated ESR of 94, recurrent pleural effusions, exudative suspicious for autoimmune etiology with recurrent exertional chest discomfort and dyspnea, abnormal CT scan of coronary arteries with suspicion for significant stenosis in RCA.  1. Exertional angina/dyspnea  - abnormal coronary CT noted calcium score 236, moderate nonobstructive CAD in RCA and LAD, suspect significant disease in RCA  - pending cath today around noon. Risk and benefit was explained yesterday  2. Recurrent pleural effusion  - ANA markedly positive, ESR markedly positive - likely autoimmune  - Steroids.  Signed, Almyra Deforest PA-C Pager: [phone] As above, patient seen and examined; no chest pain; plan cath today per Dr Marlou Porch to R/O significant CAD; Add lipitor given  CAD noted on CT scan. WU of pleural effusions and possible rheum abnormality per IM Kirk Ruths

## 2014-06-06 NOTE — Progress Notes (Signed)
Inpatient Diabetes Program Recommendations  AACE/ADA: New Consensus Statement on Inpatient Glycemic Control (2013)  Target Ranges:  Prepandial:   less than 140 mg/dL      Peak postprandial:   less than 180 mg/dL (1-2 hours)      Critically ill patients:  140 - 180 mg/dL   Lab glucose high this am at 241 mg/dL. No hx of dm noted. While on high dose steroid therapy, please consider the following:   Inpatient Diabetes Program Recommendations Correction (SSI): While on solumedroll and NPO please order sensitive correction q 4 hrs.   Thank you Rosita Kea, RN, MSN, CDE  Diabetes Inpatient Program Office: [phone] Pager: [phone] 8:00 am to 5:00 pm

## 2014-06-06 NOTE — Progress Notes (Signed)
[facility] Progress Note   ISIAHA Lee CLE:[id number] DOB: [birth year] DOA: 05/30/2014 PCP: Bronson Curb, PA-C  Brief narrative: Colin Lee is a 54 y.o. male admitted with a complaint of chest pain and shortness of breath. He tells me that his pain was present in the center of the chest and mostly occurs when he exerts himself. He discussed this with Dr. [name] last month and was recommended to see cardiology. He followed up with [facility] and underwent a Myoview stress test which was found to be normal. He is currently not having any chest pain and again tells me it is mostly when he exerts himself and lives heavy items.   Subjective: No new complaints.   Assessment/Plan: Principal Problem:  Chest pain on exertion -Etiology for this is difficult to ascertain-as mentioned above cardiac workup including an echo and stress test were normal -Coronary CT performed -  -he continues to have chest tightness on exertion along with dyspnea today- doubtful that chest pain is related to pleural effusion which is now a small effusion- -cardiology consulted for further recommendations- will have cath tomorrow  Active Problems:  Pleural effusion- fevers -He underwent a talc pleurodesis of left-sided effusion in October 2015 after multiple recurrences after drainage  -Now presented with moderate right-sided effusion which along with fevers and pneumonia  -As mentioned above the patient had a normal 2-D echo on 2/18 and therefore effusion is likely not secondary to heart failure -Did have a rheumatology eval as outpatient this past summer when ANA was first found to be postive but per patient, the rheumatologist, Dr Gavin Pound did not make a diagnosis of autoimmune disease  - He has been evaluated by CT surgery and they have determined that rather than placing a chest tube, a thoracentesis should be performed first - this has been done- fluid is  bloody with significant amount of Lymphocytes- have consulted pulmonary to assist with management- - CXR reveals only a small residual effusion -pulmonary asked my to consult ID-  ID doubts TB or any other infectious cause - ANA again positive for speckled pattern at a very high dilution 1:1280 (third positive test since last summer) - ESR 94 - current pleural fluid analysis with numerous lymphocytes and clinical symptom of fever is suspicious for a lupus related effusion- will start steroids - has on and off diffuse muscle and joint aches- has never had joint swelling - RF. CCP Ab normal- culture negative - I spoke with his rheumatologist today- Leafy Kindle PA with [facility] who tells me that the positive ANA is non- specific and the more specific tests that were done in the office were negative. I have stopped steroids.     CAP (community acquired pneumonia)? -Patient admits to having a cough for 2 weeks-also states that he is been having subjective fevers for about 3 weeks -CT scan reveals that he has pneumonia-he has been placed on Levaquin which has been discontinued by psych - symptoms of cough and fevers likely related to above effusion rather than infection   Hypertension -Continue metoprolol and ARB    Code Status: Full code Family Communication:  Disposition Plan:  DVT prophylaxis: Lovenox  Consultants: CT surgery  Procedures:   Antibiotics: Anti-infectives    Start     Dose/Rate Route Frequency Ordered Stop   05/31/14 2200  levofloxacin (LEVAQUIN) IVPB 750 mg  Status:  Discontinued    Comments:  Levaquin 750 mg IV q24h for CrCl >  50 mL/min   750 mg 100 mL/hr over 90 Minutes Intravenous Every 24 hours 05/31/14 0530 06/03/14 1649   05/31/14 0130  levofloxacin (LEVAQUIN) IVPB 750 mg     750 mg 100 mL/hr over 90 Minutes Intravenous  Once 05/31/14 0125 05/31/14 0335         Objective: Filed Weights   06/04/14 2209 06/05/14 2126 06/06/14  1054  Weight: 123.832 kg (273 lb) 77.157 kg (170 lb 1.6 oz) 122.8 kg (270 lb 11.6 oz)    Intake/Output Summary (Last 24 hours) at 06/06/14 1659 Last data filed at 06/06/14 1425  Gross per 24 hour  Intake 864.07 ml  Output      0 ml  Net 864.07 ml     Vitals Filed Vitals:   06/05/14 2126 06/06/14 0549 06/06/14 1006 06/06/14 1054  BP: 140/77 128/74 146/85   Pulse: 94 91 89   Temp: 97.9 F (36.6 C) 98.6 F (37 C) 98.4 F (36.9 C)   TempSrc: Oral Oral Oral   Resp: '16 16 18   ' Height:      Weight: 77.157 kg (170 lb 1.6 oz)   122.8 kg (270 lb 11.6 oz)  SpO2: 97% 95% 95%     Exam: General: Awake alert oriented 3, No acute respiratory distress Lungs: Clear to auscultation bilaterally without wheezes or crackles Cardiovascular: Regular rate and rhythm without murmur gallop or rub normal S1 and S2 Abdomen: Nontender, nondistended, soft, bowel sounds positive, no rebound, no ascites, no appreciable mass Extremities: No significant cyanosis, clubbing, or edema bilateral lower extremities  Data Reviewed: Basic Metabolic Panel:  Recent Labs Lab 05/30/14 2005 05/31/14 0619 06/06/14 0431  NA 137 138 136  K 4.1 3.9 4.7  CL 103 104 100  CO2 '27 25 28  ' GLUCOSE 107* 87 241*  BUN '11 11 17  ' CREATININE 0.99 0.91 0.93  CALCIUM 8.7 8.4 9.2   Liver Function Tests:  Recent Labs Lab 05/31/14 0619  AST 29  ALT 37  ALKPHOS 67  BILITOT 0.7  PROT 6.9  ALBUMIN 2.8*   No results for input(s): LIPASE, AMYLASE in the last 168 hours. No results for input(s): AMMONIA in the last 168 hours. CBC:  Recent Labs Lab 05/30/14 2005 05/31/14 0618 05/31/14 0619 06/01/14 0605 06/02/14 0547 06/06/14 0431  WBC 10.7* 8.2 7.9 7.4 8.7 25.7*  NEUTROABS 7.8*  --  5.0  --   --   --   HGB 11.1* 10.6* 10.7* 11.1* 11.6* 11.8*  HCT 33.8* 32.5* 32.9* 33.7* 35.8* 36.1*  MCV 81.8 79.9 79.9 79.7 81.7 82.8  PLT 396 340 325 396 393 540*   Cardiac Enzymes:  Recent Labs Lab 05/30/14 2005  05/31/14 0619 05/31/14 1155  TROPONINI 0.04* 0.03  0.03 <0.03   BNP (last 3 results)  Recent Labs  05/30/14 2150  BNP 92.5    ProBNP (last 3 results)  Recent Labs  10/06/13 1942  PROBNP 79.8    CBG:  Recent Labs Lab 06/01/14 0756 06/01/14 1220 06/01/14 1712 06/02/14 0008 06/02/14 0611  GLUCAP 105* 101* 100* 95 96    Recent Results (from the past 240 hour(s))  Body fluid culture with gram stain     Status: None   Collection Time: 06/01/14 10:59 AM  Result Value Ref Range Status   Specimen Description FLUID PLEURAL RIGHT  Final   Special Requests NONE  Final   Gram Stain   Final    FEW WBC PRESENT,BOTH PMN AND MONONUCLEAR NO ORGANISMS SEEN Performed at  [facility]    Culture   Final    NO GROWTH 3 DAYS Performed at [facility]    Report Status 06/04/2014 FINAL  Final  Culture, sputum-assessment     Status: None   Collection Time: 06/01/14  8:48 PM  Result Value Ref Range Status   Specimen Description SPUTUM  Final   Special Requests NONE  Final   Sputum evaluation   Final    THIS SPECIMEN IS ACCEPTABLE. RESPIRATORY CULTURE REPORT TO FOLLOW.   Report Status 06/01/2014 FINAL  Final  Culture, respiratory (NON-Expectorated)     Status: None   Collection Time: 06/01/14  8:48 PM  Result Value Ref Range Status   Specimen Description SPUTUM  Final   Special Requests NONE  Final   Gram Stain   Final    MODERATE WBC PRESENT,BOTH PMN AND MONONUCLEAR RARE SQUAMOUS EPITHELIAL CELLS PRESENT FEW GRAM POSITIVE COCCI IN PAIRS IN CLUSTERS Performed at [facility]    Culture   Final    NORMAL OROPHARYNGEAL FLORA Performed at [facility]    Report Status 06/04/2014 FINAL  Final  Surgical pcr screen     Status: None   Collection Time: 06/05/14  9:59 PM  Result Value Ref Range Status   MRSA, PCR NEGATIVE NEGATIVE Final   Staphylococcus aureus NEGATIVE NEGATIVE Final    Comment:        The Xpert SA Assay (FDA approved for  NASAL specimens in patients over 78 years of age), is one component of a comprehensive surveillance program.  Test performance has been validated by [facility] for patients greater than or equal to 79 year old. It is not intended to diagnose infection nor to guide or monitor treatment.      Studies:  Recent x-ray studies have been reviewed in detail by the Attending Physician  Scheduled Meds:  Scheduled Meds: . [START ON 06/07/2014] aspirin EC  325 mg Oral Daily  . atorvastatin  40 mg Oral q1800  . benzonatate  100 mg Oral TID  . enoxaparin (LOVENOX) injection  60 mg Subcutaneous Q24H  . losartan  50 mg Oral Daily  . metoprolol tartrate  12.5 mg Oral BID  . pantoprazole  40 mg Oral Daily  . polyethylene glycol  17 g Oral Daily  . [START ON 06/07/2014] predniSONE  60 mg Oral Q breakfast  . rOPINIRole  0.25 mg Oral QHS  . sodium chloride  3 mL Intravenous Q12H  . sodium chloride  3 mL Intravenous Q12H  . sodium chloride  3 mL Intravenous Q12H   Continuous Infusions: . sodium chloride 1 mL/kg/hr (06/06/14 1324)    Time spent on care of this patient: 35 minutes   [place], MD 06/06/2014, 4:59 PM  LOS: 6 days   [facility] Office  [phone] Pager - Text Page per [email or web address]  If 7PM-7AM, please contact night-coverage [email or web address]

## 2014-06-06 NOTE — Progress Notes (Addendum)
Subjective: No acute events overnight.  Pt comfortable, in no distress.  Objective: Vital signs in last 24 hours: Blood pressure 146/85, pulse 89, temperature 98.4 F (36.9 C), temperature source Oral, resp. rate 18, height '5\' 10"'  (1.778 m), weight 122.8 kg (270 lb 11.6 oz), SpO2 95 %.  Intake/Output from previous day: 02/28 0701 - 02/29 0700 In: 600 [P.O.:600] Out: -    Physical Exam:  Pleasant male, resting in chair, in NAD. Resps even and unlabored, CTA bilaterally, no W/R/R. RRR, no M/R/G. No gross deformities, no peripheral edema. Alert and oriented, moves all 4.    Lab Results:  Recent Labs  06/06/14 0431  WBC 25.7*  HGB 11.8*  HCT 36.1*  PLT 540*   BMET  Recent Labs  06/06/14 0431  NA 136  K 4.7  CL 100  CO2 28  GLUCOSE 241*  BUN 17  CREATININE 0.93  CALCIUM 9.2    Assessment/Plan:  Right pleural effusion unknown origin s/p thoracentesis 2/24 (done by IR).  His fluid is a lymphocyte predominant exudate, with cultures and cytology being negative.  His ADA and ANCA is still pending.  It is interesting to note that his ANA and ESR are very elevated, suspicious for some type of autoimmune disease.  The pt is in no distress, and his effusion remains small. Recs: Steroids started by primary team. Would arrange follow up with Rheumatology as an outpatient for further workup / management.  PCCM will sign off.  Please do not hesitate to call us back if we can be of any further assistance.   Montey Hora, [facility] Pager: [phone]  or [phone] 06/06/2014, 1:04 PM  Attending  I have seen and examined the patient with nurse practitioner/resident and agree with the note above.   Lungs clear on my exam Cardiac exam wnl with the exception of some edema in legs  I have reviewed the chart extensively including the Fall 2015 evaluation.  CT chest reviewed> obese, left pleural thickening, question mild  GGO non-specific mild fibrotic change lingula, R pleural effusion noted  There is no question in my mind that this is an autoimmune process.  He notes symmetric finger, wrist, and elbow joint aches in the morning.  Also unexplained dry mouth.  Obviously these are non-specific findings and the ANA itself can also be non-specific.  However given no other explanation for the chronic inflammation seen on the pleural biopsy from 01/2014, the lack of clear infection, the now high tighter ANA, and the recurrence of the pleural effusion with lymphocytosis and no other explanation, this all seems consistent with an autoimmune process like Lupus or an associated connective tissue disease.    Though he has been on prednisone, will check the following labs: Anti-Jo-1 SSA/SSB Anti-SCL-70  Would recommend tapering off of prednisone over the course of 4 weeks.  Would have him f/u with Dr. Trudie Reed in the next 4 weeks.  PCCM will sign off call if questions  Roselie Awkward, MD [facility] Pager: [phone] Cell: [phone] If no response, call [phone]

## 2014-06-06 NOTE — Progress Notes (Signed)
Cath delayed until tomorrow AM, informed both patient and nursing staff  Signed, Almyra Deforest PA Pager: [phone]

## 2014-06-06 NOTE — Progress Notes (Signed)
Patient ID: Colin Lee, male   DOB: 08/10/1960, 54 y.o.   MRN: [id number]         [facility]    Date of Admission:  05/30/2014     Principal Problem:   Pleural effusion exudative Active Problems:   Hypertension   Pulmonary infiltrate   Low grade fever   Myalgia   Arthralgia   Normocytic anemia   Angina pectoris   . [START ON 06/07/2014] aspirin EC  325 mg Oral Daily  . atorvastatin  40 mg Oral q1800  . benzonatate  100 mg Oral TID  . enoxaparin (LOVENOX) injection  60 mg Subcutaneous Q24H  . losartan  50 mg Oral Daily  . methylPREDNISolone (SOLU-MEDROL) injection  60 mg Intravenous Q12H  . metoprolol tartrate  12.5 mg Oral BID  . pantoprazole  40 mg Oral Daily  . polyethylene glycol  17 g Oral Daily  . rOPINIRole  0.25 mg Oral QHS  . sodium chloride  3 mL Intravenous Q12H  . sodium chloride  3 mL Intravenous Q12H  . sodium chloride  3 mL Intravenous Q12H    Subjective: He is feeling much better since steroids were started 48 hours ago. His cough and shortness of breath have resolved. He no longer seen has the pain and stiffness in his hands.  Review of Systems: Pertinent items are noted in HPI.  Past Medical History  Diagnosis Date  . GERD (gastroesophageal reflux disease)   . Hypertension   . Shortness of breath     Starting May 2015  . Headache     stress and tension  . Neuromuscular disorder     restless legs and leg cramps  . Arthritis   . Asthma     History  Substance Use Topics  . Smoking status: Former Smoker -- 1.00 packs/day for 30 years    Types: Cigarettes    Quit date: 04/08/2000  . Smokeless tobacco: Never Used  . Alcohol Use: Yes     Comment: rare    Family History  Problem Relation Age of Onset  . Adopted: Yes   No Known Allergies  OBJECTIVE: Blood pressure 146/85, pulse 89, temperature 98.4 F (36.9 C), temperature source Oral, resp. rate 18, height 5\' 10"  (1.778 m), weight 270 lb 11.6 oz  (122.8 kg), SpO2 95 %. General: He is smiling, sitting up in a chair, watching TV Skin: No rash Lungs: Diminished breath sounds in right base Cor: Regular S1 and S2 with no murmur or rub heard  Lab Results Lab Results  Component Value Date   WBC 25.7* 06/06/2014   HGB 11.8* 06/06/2014   HCT 36.1* 06/06/2014   MCV 82.8 06/06/2014   PLT 540* 06/06/2014    Lab Results  Component Value Date   CREATININE 0.93 06/06/2014   BUN 17 06/06/2014   NA 136 06/06/2014   K 4.7 06/06/2014   CL 100 06/06/2014   CO2 28 06/06/2014    Lab Results  Component Value Date   ALT 37 05/31/2014   AST 29 05/31/2014   ALKPHOS 67 05/31/2014   BILITOT 0.7 05/31/2014     Microbiology: Recent Results (from the past 240 hour(s))  Body fluid culture with gram stain     Status: None   Collection Time: 06/01/14 10:59 AM  Result Value Ref Range Status   Specimen Description FLUID PLEURAL RIGHT  Final   Special Requests NONE  Final   Gram Stain   Final  FEW WBC PRESENT,BOTH PMN AND MONONUCLEAR NO ORGANISMS SEEN Performed at [facility]    Culture   Final    NO GROWTH 3 DAYS Performed at [facility]    Report Status 06/04/2014 FINAL  Final  Culture, sputum-assessment     Status: None   Collection Time: 06/01/14  8:48 PM  Result Value Ref Range Status   Specimen Description SPUTUM  Final   Special Requests NONE  Final   Sputum evaluation   Final    THIS SPECIMEN IS ACCEPTABLE. RESPIRATORY CULTURE REPORT TO FOLLOW.   Report Status 06/01/2014 FINAL  Final  Culture, respiratory (NON-Expectorated)     Status: None   Collection Time: 06/01/14  8:48 PM  Result Value Ref Range Status   Specimen Description SPUTUM  Final   Special Requests NONE  Final   Gram Stain   Final    MODERATE WBC PRESENT,BOTH PMN AND MONONUCLEAR RARE SQUAMOUS EPITHELIAL CELLS PRESENT FEW GRAM POSITIVE COCCI IN PAIRS IN CLUSTERS Performed at [facility]    Culture   Final    NORMAL  OROPHARYNGEAL FLORA Performed at [facility]    Report Status 06/04/2014 FINAL  Final  Surgical pcr screen     Status: None   Collection Time: 06/05/14  9:59 PM  Result Value Ref Range Status   MRSA, PCR NEGATIVE NEGATIVE Final   Staphylococcus aureus NEGATIVE NEGATIVE Final    Comment:        The Xpert SA Assay (FDA approved for NASAL specimens in patients over 62 years of age), is one component of a comprehensive surveillance program.  Test performance has been validated by [facility] for patients greater than or equal to 66 year old. It is not intended to diagnose infection nor to guide or monitor treatment.     Assessment: He is improving on steroids for presumed autoimmune disease. I find no evidence of infection.  Plan: 1. Continue observation off of antibiotics 2. I will sign off now  Michel Bickers, MD [facility] [place] [phone] pager   [phone] cell 06/06/2014, 1:04 PM

## 2014-06-07 ENCOUNTER — Encounter (HOSPITAL_COMMUNITY)
Admission: EM | Disposition: A | Discharge status: Home / Self Care | Payer: BC Managed Care – PPO | Source: Home / Self Care | Attending: Internal Medicine

## 2014-06-07 ENCOUNTER — Encounter (HOSPITAL_COMMUNITY): Payer: Self-pay | Admitting: Cardiology

## 2014-06-07 DIAGNOSIS — I25110 Atherosclerotic heart disease of native coronary artery with unstable angina pectoris: Principal | ICD-10-CM

## 2014-06-07 DIAGNOSIS — I200 Unstable angina: Secondary | ICD-10-CM | POA: Diagnosis present

## 2014-06-07 HISTORY — PX: LEFT HEART CATHETERIZATION WITH CORONARY ANGIOGRAM: SHX5451

## 2014-06-07 LAB — JO-1 ANTIBODY-IGG: Jo-1 Antibody, IgG: <1

## 2014-06-07 LAB — ANTI-SCLERODERMA ANTIBODY: Scleroderma (Scl-70) (ENA) Antibody, IgG: <1

## 2014-06-07 LAB — OTHER BODY FLUID CHEMISTRY

## 2014-06-07 LAB — SJOGRENS SYNDROME-A EXTRACTABLE NUCLEAR ANTIBODY: SSA (Ro) (ENA) Antibody, IgG: <1

## 2014-06-07 LAB — SJOGRENS SYNDROME-B EXTRACTABLE NUCLEAR ANTIBODY: SSB (La) (ENA) Antibody, IgG: <1

## 2014-06-07 LAB — CBC
HCT: 35.4 % — ABNORMAL LOW (ref 39.0–52.0)
Hemoglobin: 11.4 g/dL — ABNORMAL LOW (ref 13.0–17.0)
MCH: 26.5 pg (ref 26.0–34.0)
MCHC: 32.2 g/dL (ref 30.0–36.0)
MCV: 82.1 fL (ref 78.0–100.0)
Platelets: 462 10*3/uL — ABNORMAL HIGH (ref 150–400)
RBC: 4.31 MIL/uL (ref 4.22–5.81)
RDW: 13.1 % (ref 11.5–15.5)
WBC: 17.7 10*3/uL — ABNORMAL HIGH (ref 4.0–10.5)

## 2014-06-07 LAB — POCT ACTIVATED CLOTTING TIME: Activated Clotting Time: 454 seconds

## 2014-06-07 LAB — ANTI-DNA ANTIBODY, DOUBLE-STRANDED: ds DNA Ab: 3 IU/mL

## 2014-06-07 SURGERY — LEFT HEART CATHETERIZATION WITH CORONARY ANGIOGRAM
Anesthesia: LOCAL

## 2014-06-07 MED ORDER — MORPHINE SULFATE 2 MG/ML IJ SOLN
2.0000 mg | INTRAMUSCULAR | Status: DC | PRN
  Administered 2014-06-07 – 2014-06-08 (×2): 2 mg via INTRAVENOUS
  Filled 2014-06-07 (×2): qty 1

## 2014-06-07 MED ORDER — BIVALIRUDIN 250 MG IV SOLR
INTRAVENOUS | Status: AC
  Filled 2014-06-07: qty 250

## 2014-06-07 MED ORDER — NITROGLYCERIN 1 MG/10 ML FOR IR/CATH LAB
INTRA_ARTERIAL | Status: AC
  Filled 2014-06-07: qty 10

## 2014-06-07 MED ORDER — NITROGLYCERIN 1 MG/10 ML FOR IR/CATH LAB
INTRA_ARTERIAL | Status: AC
Start: 2014-06-07 — End: 2014-06-07
  Filled 2014-06-07: qty 10

## 2014-06-07 MED ORDER — NITROGLYCERIN 0.4 MG SL SUBL
SUBLINGUAL_TABLET | SUBLINGUAL | Status: AC
  Administered 2014-06-07: 13:00:00 0.4 mg
  Filled 2014-06-07: qty 1

## 2014-06-07 MED ORDER — HEPARIN SODIUM (PORCINE) 1000 UNIT/ML IJ SOLN
INTRAMUSCULAR | Status: AC
  Filled 2014-06-07: qty 1

## 2014-06-07 MED ORDER — ASPIRIN 81 MG PO CHEW
81.0000 mg | CHEWABLE_TABLET | ORAL | Status: AC
  Administered 2014-06-07: 81 mg via ORAL
  Filled 2014-06-07: qty 1

## 2014-06-07 MED ORDER — MIDAZOLAM HCL 2 MG/2ML IJ SOLN
INTRAMUSCULAR | Status: AC
  Filled 2014-06-07: qty 2

## 2014-06-07 MED ORDER — SODIUM CHLORIDE 0.9 % IV SOLN
1.0000 mL/kg/h | INTRAVENOUS | Status: AC
Start: 2014-06-07 — End: 2014-06-07
  Administered 2014-06-07: 1 mL/kg/h via INTRAVENOUS

## 2014-06-07 MED ORDER — PREDNISONE 20 MG PO TABS
30.0000 mg | ORAL_TABLET | Freq: Once | ORAL | Status: AC
  Administered 2014-06-07: 20:00:00 30 mg via ORAL
  Filled 2014-06-07: qty 1

## 2014-06-07 MED ORDER — NITROGLYCERIN IN D5W 200-5 MCG/ML-% IV SOLN
2.0000 ug/min | INTRAVENOUS | Status: DC
  Administered 2014-06-07 (×2): 10 ug/min via INTRAVENOUS
  Filled 2014-06-07: qty 250

## 2014-06-07 MED ORDER — TICAGRELOR 90 MG PO TABS
ORAL_TABLET | ORAL | Status: AC
  Filled 2014-06-07: qty 1

## 2014-06-07 MED ORDER — FENTANYL CITRATE 0.05 MG/ML IJ SOLN
INTRAMUSCULAR | Status: AC
  Filled 2014-06-07: qty 2

## 2014-06-07 MED ORDER — TICAGRELOR 90 MG PO TABS
90.0000 mg | ORAL_TABLET | Freq: Two times a day (BID) | ORAL | Status: DC
  Administered 2014-06-07 – 2014-06-09 (×4): 90 mg via ORAL
  Filled 2014-06-07 (×6): qty 1

## 2014-06-07 MED ORDER — ASPIRIN 81 MG PO CHEW: 81.0000 mg | CHEWABLE_TABLET | Freq: Every day | ORAL | Status: DC

## 2014-06-07 MED ORDER — HEPARIN (PORCINE) IN NACL 2-0.9 UNIT/ML-% IJ SOLN
INTRAMUSCULAR | Status: AC
  Filled 2014-06-07: qty 1500

## 2014-06-07 MED ORDER — NITROGLYCERIN 0.4 MG SL SUBL
SUBLINGUAL_TABLET | SUBLINGUAL | Status: AC
  Administered 2014-06-07: 13:00:00
  Filled 2014-06-07: qty 1

## 2014-06-07 MED ORDER — PREDNISONE 20 MG PO TABS: 40.0000 mg | ORAL_TABLET | Freq: Every day | ORAL | Status: DC

## 2014-06-07 MED ORDER — LIDOCAINE HCL (PF) 1 % IJ SOLN
INTRAMUSCULAR | Status: AC
  Filled 2014-06-07: qty 30

## 2014-06-07 MED ORDER — PREDNISONE 20 MG PO TABS
20.0000 mg | ORAL_TABLET | Freq: Every day | ORAL | Status: DC
  Administered 2014-06-08 – 2014-06-09 (×2): 20 mg via ORAL
  Filled 2014-06-07 (×3): qty 1

## 2014-06-07 MED ORDER — VERAPAMIL HCL 2.5 MG/ML IV SOLN
INTRAVENOUS | Status: AC
  Filled 2014-06-07: qty 2

## 2014-06-07 NOTE — Progress Notes (Signed)
TR BAND REMOVAL  LOCATION:  right radial  DEFLATED PER PROTOCOL:  Yes.    TIME BAND OFF / DRESSING APPLIED:   1545   SITE UPON ARRIVAL:   Level 0  SITE AFTER BAND REMOVAL:  Level 0  REVERSE ALLEN'S TEST:    positive  CIRCULATION SENSATION AND MOVEMENT:  Within Normal Limits  Yes.    COMMENTS:  rebleed during deflation; protocol followed

## 2014-06-07 NOTE — H&P (View-Only) (Signed)
 Patient Name: Colin Lee Date of Encounter: 06/06/2014  Primary Cardiologist: Dr. Turner   Principal Problem:   Pleural effusion exudative Active Problems:   Hypertension   Pulmonary infiltrate   Low grade fever   Myalgia   Arthralgia   Normocytic anemia   Angina pectoris    SUBJECTIVE  Denies any CP overnight. No SOB.   CURRENT MEDS . [START ON 06/07/2014] aspirin EC  325 mg Oral Daily  . benzonatate  100 mg Oral TID  . enoxaparin (LOVENOX) injection  60 mg Subcutaneous Q24H  . losartan  50 mg Oral Daily  . methylPREDNISolone (SOLU-MEDROL) injection  60 mg Intravenous Q12H  . metoprolol tartrate  12.5 mg Oral BID  . pantoprazole  40 mg Oral Daily  . polyethylene glycol  17 g Oral Daily  . rOPINIRole  0.25 mg Oral QHS  . sodium chloride  3 mL Intravenous Q12H  . sodium chloride  3 mL Intravenous Q12H  . sodium chloride  3 mL Intravenous Q12H    OBJECTIVE  Filed Vitals:   06/05/14 1000 06/05/14 2126 06/06/14 0549 06/06/14 1006  BP: 137/86 140/77 128/74 146/85  Pulse: 87 94 91 89  Temp: 98.5 F (36.9 C) 97.9 F (36.6 C) 98.6 F (37 C) 98.4 F (36.9 C)  TempSrc: Oral Oral Oral Oral  Resp: 18 16 16 18  Height:      Weight:  170 lb 1.6 oz (77.157 kg)    SpO2: 100% 97% 95% 95%    Intake/Output Summary (Last 24 hours) at 06/06/14 1042 Last data filed at 06/05/14 1300  Gross per 24 hour  Intake    360 ml  Output      0 ml  Net    360 ml   Filed Weights   06/03/14 2100 06/04/14 2209 06/05/14 2126  Weight: 266 lb 8.6 oz (120.9 kg) 273 lb (123.832 kg) 170 lb 1.6 oz (77.157 kg)    PHYSICAL EXAM  General: Pleasant, NAD. Neuro: Alert and oriented X 3. Moves all extremities spontaneously. Psych: Normal affect. HEENT:  Normal  Neck: Supple without bruits or JVD. Lungs:  Resp regular and unlabored, CTA. Heart: RRR no s3, s4, or murmurs. Abdomen: Soft, non-tender, non-distended, BS + x 4.  Extremities: No clubbing, cyanosis or edema. DP/PT/Radials  2+ and equal bilaterally.  Accessory Clinical Findings  CBC  Recent Labs  06/06/14 0431  WBC 25.7*  HGB 11.8*  HCT 36.1*  MCV 82.8  PLT 540*   Basic Metabolic Panel  Recent Labs  06/06/14 0431  NA 136  K 4.7  CL 100  CO2 28  GLUCOSE 241*  BUN 17  CREATININE 0.93  CALCIUM 9.2    TELE NSR with HR 70-80s    ECG  No new EKG  Echocardiogram 05/26/2014  LV EF: 55% -  60%  ------------------------------------------------------------------- Indications:   R07.9 Chest Pain..  ------------------------------------------------------------------- History:  PMH: Acquired from the patient and from the patient&'s chart. PMH: History of Pleural Effusion. Risk factors: Former tobacco use. Hypertension. Obese.  ------------------------------------------------------------------- Study Conclusions  - Left ventricle: The cavity size was normal. Wall thickness was normal. Systolic function was normal. The estimated ejection fraction was in the range of 55% to 60%. Wall motion was normal; there were no regional wall motion abnormalities. Doppler parameters are consistent with abnormal left ventricular relaxation (grade 1 diastolic dysfunction). - Left atrium: The atrium was mildly dilated. - Pulmonary arteries: Systolic pressure was mildly increased.  Impressions:  - Technically difficult;   definity used. Normal LV function; grade 1 diastolic dysfunction; trace TR; mildly elevated pulmonary pressure.    Radiology/Studies  Dg Chest 1 View  06/01/2014   CLINICAL DATA:  Status post thoracentesis.  EXAM: CHEST  1 VIEW  COMPARISON:  Chest CT 05/31/2014  FINDINGS: The cardiac silhouette, mediastinal and hilar contours are Stable. There is tortuosity and calcification of the thoracic aorta. Interval decrease and right-sided pleural effusion. No postprocedural pneumothorax is identified.  IMPRESSION: Decrease in right-sided pleural effusion. No  postprocedural pneumothorax.   Electronically Signed   By: P.  Gallerani M.D.   On: 06/01/2014 12:19   Dg Chest 2 View  06/03/2014   CLINICAL DATA:  53-year-old male with right pleural effusion status post thoracentesis yesterday. Initial encounter.  EXAM: CHEST  2 VIEW  COMPARISON:  Chest CT 06/02/2014 and earlier.  FINDINGS: Small bilateral pleural effusions have not significantly changed since January. The right is larger. No pneumothorax identified. Stable cardiac size and mediastinal contours. Stable streaky perihilar and basilar pulmonary opacity. No interval areas of worsening ventilation. Bulky first rib costochondral calcification re - identified. Visualized tracheal air column is within normal limits. No acute osseous abnormality identified.  IMPRESSION: Small bilateral pleural effusions. No pneumothorax after thoracentesis. No significant change in ventilation.   Electronically Signed   By: H  Hall M.D.   On: 06/03/2014 07:52   Dg Chest 2 View  05/30/2014   CLINICAL DATA:  Left-sided chest pain and shortness of breath for 3 days. Multiple prior left thoracentesis he has over the past 5 years.  EXAM: CHEST  2 VIEW  COMPARISON:  05/03/2014  FINDINGS: Mild cardiac enlargement with increased pulmonary vascularity. No edema or consolidation. Small bilateral pleural effusions with basilar atelectasis. Appearance is similar to prior study. No pneumothorax. Mediastinal contours appear intact.  IMPRESSION: Bilateral pleural effusions with basilar atelectasis. Mild cardiac enlargement and pulmonary vascular congestion.   Electronically Signed   By: William  Stevens M.D.   On: 05/30/2014 20:53   Ct Angio Chest Pe W/cm &/or Wo Cm  05/31/2014   CLINICAL DATA:  Chest pain and shortness of breath beginning mid January, worsening since Saturday.  EXAM: CT ANGIOGRAPHY CHEST WITH CONTRAST  TECHNIQUE: Multidetector CT imaging of the chest was performed using the standard protocol during bolus administration of  intravenous contrast. Multiplanar CT image reconstructions and MIPs were obtained to evaluate the vascular anatomy.  CONTRAST:  100mL OMNIPAQUE IOHEXOL 350 MG/ML SOLN  COMPARISON:  Chest radiograph May 30, 2014 at 2010 hours and CT of the chest December 27, 2013  FINDINGS: Adequate pulmonary arterial contrast opacification. Main pulmonary artery is not enlarged. No pulmonary embolism to the level of the subsegmental branches though mild respiratory motion degrades sensitivity.  Heart size is mildly enlarged, no pericardial fluid collections. Thoracic aorta is normal in course and caliber with mild calcific atherosclerosis. RIGHT hilar lymphadenopathy measures up to 12 mm short axis. Pre treat tracheal 13 mm short axis lymph node, additional smaller pre VATS vascular and aortopulmonary window lymph nodes.  Moderate RIGHT pleural effusion, small LEFT pleural effusion and thickening. Pulmonary vascular congestion. Patchy ground-glass opacities and interstitial prominence in LEFT upper lobe, axial 39/90. Bilateral lower lobe atelectasis. Patchy enhancing LEFT lower lobe airspace opacity. Tracheobronchial tree is patent and midline. No pneumothorax.  Multiple subcentimeter gallstones without CT findings of acute cholecystitis. 2.5 cm cyst in RIGHT lobe of the liver, liver is otherwise unremarkable.  Review of the MIP images confirms the above findings.  IMPRESSION: No acute   pulmonary embolism on this mild respiratory motion degraded examination.  Mild cardiomegaly and pulmonary vascular congestion. Moderate RIGHT pleural effusion. Patchy LEFT upper lobe airspace opacity could reflect confluent edema though, pneumonia is a concern. Patchy enhancing atelectasis/ pneumonia LEFT lower lobe. Recommend followup imaging after treatment to verify improvement.  Mediastinal lymphadenopathy may be reactive though, recommend close attention on follow-up imaging.   Electronically Signed   By: Courtnay  Bloomer   On: 05/31/2014  01:03   Ct Coronary Morp W/cta Cor W/score W/ca W/cm &/or Wo/cm  06/02/2014   ADDENDUM REPORT: 06/02/2014 20:52  CLINICAL DATA:  Chest pain  EXAM: Cardiac/Coronary  CT  TECHNIQUE: The patient was scanned on a Philips 256 scanner.  FINDINGS: A 120 kV prospective scan was triggered in the descending thoracic aorta at 111 HU's. Axial non-contrast 3 mm slices were carried out through the heart. The data set was analyzed on a dedicated work station and scored using the Agatson method. Gantry rotation speed was 270 msecs and collimation was .9 mm. No beta blockade and 0.4 mg of sl NTG was given. The 3D data set was reconstructed in 5% intervals of the 67-82 % of the R-R cycle. Diastolic phases were analyzed on a dedicated work station using MPR, MIP and VRT modes. The patient received 80 cc of contrast.  Aorta:  Aortic Valve:  Coronary Arteries: Originating in normal position. Right dominance.  RCA is a very large dominant vessel that gives rise to PDA and PLA arteries. There is mild calcified plaque in the proximal portion associated with 25-50% stenosis. Proximal to mid portion is affected by motion and accurate stenosis is not possible. Mid and distal RCA has no significant stenosis.  Left main is a large vessel that has mild mixed, predominantly calcified plaque in its proximal portion associated with 25-50% stenosis.  LAD is a large vessel that has mild calcified plaque in its proximal segment associated with 25-50% stenosis and minimal plaque in the mid segment with 0-25% stenosis. LAD gives rise to two diagonal branches with no significant stenosis.  LCX artery is a medium size non-dominant vessel that gives rise to one obtuse marginal branch. There is only minimal calcified plaque in mid LCX artery associated with 0-25% stenosis.  IMPRESSION: 1. Coronary calcium score of 236. This was 90 percentile for age and sex matched control.  2.  Normal coronary origin.  Right dominance.  3. 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 Significant motion artifact in the proximal to mid RCA affecting ability read, however there is suspicion for a significant stenosis.  Katarina Nelson   Electronically Signed   By: Katarina  Nelson   On: 06/02/2014 20:52   06/02/2014   EXAM: OVER-READ INTERPRETATION  CT CHEST  The following report is an over-read performed by radiologist Dr. Kyle Talbotof [facility] on 06/02/2014. This over-read does not include interpretation of cardiac or coronary anatomy or pathology. The coronary calcium score/coronary CTA interpretation by the cardiologist is attached.  COMPARISON:  Chest radiograph above 06/02/2014. Chest CT of the 2 days prior.  FINDINGS: Mediastinum/Nodes: Mediastinal adenopathy, as detailed on recent diagnostic CT. A right paratracheal node measures 1.3 cm on image 10 of series 504.  Subcarinal node measures 1.3 cm on image 24.  1.6 cm right hilar node is mildly enlarged.  Aortic atherosclerosis, without aneurysm.  Lungs/Pleura: Decrease in size of a small right pleural effusion. Trace left pleural fluid or thickening. Circumferential right-sided pleural thickening is also identified and is mild.    Subpleural left upper lobe ground-glass opacity is improved. Scattered areas of subsegmental atelectasis in both lungs.  Upper abdomen: Hepatic steatosis. Central left hepatic lobe cyst. Cholelithiasis.  Musculoskeletal: No acute osseous abnormality.  IMPRESSION: 1. Thoracic adenopathy, as detailed on 05/31/2014 CT. This could be reactive or related to congestive heart failure. Consider followup chest CT at 3 months to confirm stability. 2. Improved left upper lobe ground-glass opacity, possibly related to pulmonary edema. 3. Decrease in small right pleural effusion with bilateral pleural thickening.  Electronically Signed: By: Kyle  Talbot M.D. On: 06/02/2014 14:52   Dg Chest Port 1 View  06/04/2014   CLINICAL DATA:  Pleural effusion  EXAM: PORTABLE CHEST - 1 VIEW  COMPARISON:   06/03/2014  FINDINGS: Cardiac shadow is stable. Bilateral pleural effusions are again seen and stable. No new focal infiltrate is noted. No bony abnormality is seen.  IMPRESSION: Stable small effusions bilaterally.   Electronically Signed   By: Mark  Lukens M.D.   On: 06/04/2014 08:58   Dg Chest Port 1 View  06/02/2014   CLINICAL DATA:  Pleural effusion.  EXAM: PORTABLE CHEST - 1 VIEW  COMPARISON:  June 01, 2014.  FINDINGS: Stable cardiomediastinal silhouette. No pneumothorax is noted. Bony thorax is intact. Stable mild right pleural effusion is noted. No other significant abnormality is noted.  IMPRESSION: Stable mild right pleural effusion.   Electronically Signed   By: James  Green Jr, M.D.   On: 06/02/2014 09:36   Us Thoracentesis Asp Pleural Space W/img Guide  06/01/2014   INDICATION: Symptomatic right sided pleural effusion  EXAM: US THORACENTESIS ASP PLEURAL SPACE W/IMG GUIDE  COMPARISON:  Previous thoracentesis  MEDICATIONS: 10 cc 1% lidocaine  COMPLICATIONS: None immediate  TECHNIQUE: Informed written consent was obtained from the patient after a discussion of the risks, benefits and alternatives to treatment. A timeout was performed prior to the initiation of the procedure.  Initial ultrasound scanning demonstrates a right pleural effusion. The lower chest was prepped and draped in the usual sterile fashion. 1% lidocaine was used for local anesthesia.  Under direct ultrasound guidance, a 19 gauge, 7-cm, Yueh catheter was introduced. An ultrasound image was saved for documentation purposes. the thoracentesis was performed. The catheter was removed and a dressing was applied. The patient tolerated the procedure well without immediate post procedural complication. The patient was escorted to have an upright chest radiograph.  FINDINGS: A total of approximately 420 cc of blood tinged fluid was removed. Requested samples were sent to the laboratory.  IMPRESSION: Successful ultrasound-guided R sided  thoracentesis yielding 420 cc of pleural fluid.  Read by:  Pamela A Turpin PAC   Electronically Signed   By: M.  Shick M.D.   On: 06/01/2014 14:42    ASSESSMENT AND PLAN  53-year-old male with positive ANA speckled pattern, elevated ESR of 94, recurrent pleural effusions, exudative suspicious for autoimmune etiology with recurrent exertional chest discomfort and dyspnea, abnormal CT scan of coronary arteries with suspicion for significant stenosis in RCA.  1. Exertional angina/dyspnea  - abnormal coronary CT noted calcium score 236, moderate nonobstructive CAD in RCA and LAD, suspect significant disease in RCA  - pending cath today around noon. Risk and benefit was explained yesterday  2. Recurrent pleural effusion  - ANA markedly positive, ESR markedly positive - likely autoimmune  - Steroids.  Signed, Meng, Hao PA-C Pager: [phone] As above, patient seen and examined; no chest pain; plan cath today per Dr Skains to R/O significant CAD; Add lipitor given   CAD noted on CT scan. WU of pleural effusions and possible rheum abnormality per IM Kirk Ruths

## 2014-06-07 NOTE — CV Procedure (Signed)
    Cardiac Catheterization Procedure Note  Name: Colin Lee MRN: [id number] DOB: 1960-12-31  Procedure: Left Heart Cath, Selective Coronary Angiography, LV angiography, PTCA and stenting of the proximal RCA  Indication: 54 yo WM presents with unstable angina. Cardiac CT indicated a severe stenosis in the proximal RCA.  Procedural Details:  The right wrist was prepped, draped, and anesthetized with 1% lidocaine. Using the modified Seldinger technique, a 6 French slender sheath was introduced into the right radial artery. 3 mg of verapamil was administered through the sheath, weight-based unfractionated heparin was administered intravenously. Standard Judkins catheters were used for selective coronary angiography and left ventriculography. Catheter exchanges were performed over an exchange length guidewire.  PROCEDURAL FINDINGS Hemodynamics: AO 134/84 mean 106 mm Hg LV 134/20 mm Hg   Coronary angiography: Coronary dominance: right  Left mainstem: 30% ostial stenosis  Left anterior descending (LAD): Normal.   Left circumflex (LCx): Normal  Right coronary artery (RCA): There is a complex 90% stenosis in the proximal vessel with ulceration.  Left ventriculography: Left ventricular systolic function is normal, LVEF is estimated at 55-65%, there is no significant mitral regurgitation   PCI Note:  Following the diagnostic procedure, the decision was made to proceed with PCI of the RCA.  Weight-based bivalirudin was given for anticoagulation. Brilinta 180 mg was given orally. Once a therapeutic ACT was achieved, a 6 [place] FR4 guide catheter was inserted.  A prowater coronary guidewire was used to cross the lesion.  The lesion was predilated with a 2.5 mm balloon.  The lesion was then stented with a 4.0 x 20 mm Promus stent.  Just as the stent was being deployed the patient coughed and took a deep breath resulting in the stent being displaced proximally and only partially covering the  lesion. A second 4.0 x 12 mm Promus stent was used to cover this segment. The stents were postdilated with a 4.5 mm noncompliant balloon.  Following PCI, there was 0% residual stenosis and TIMI-3 flow. Final angiography confirmed an excellent result. The patient tolerated the procedure well. There were no immediate procedural complications. A TR band was used for radial hemostasis. The patient was transferred to the post catheterization recovery area for further monitoring.  PCI Data: Vessel - RCA/Segment - proximal Percent Stenosis (pre)  90% TIMI-flow 3 Stent 4.0 x 20 and 4.0 x 12 mm Promus stents Percent Stenosis (post) 0% TIMI-flow (post) 3  Final Conclusions:   1. Single vessel obstructive CAD 2. Normal LV function. 3. Successful stenting of the proximal RCA with DES.   Recommendations:  DAPT for one year. Anticipate DC tomorrow.   Peter Martinique, [place] 06/07/2014, 9:18 AM

## 2014-06-07 NOTE — Interval H&P Note (Signed)
History and Physical Interval Note:  06/07/2014 8:10 AM  Margarette Canada  has presented today for surgery, with the diagnosis of unstable angina  The various methods of treatment have been discussed with the patient and family. After consideration of risks, benefits and other options for treatment, the patient has consented to  Procedure(s): LEFT HEART CATHETERIZATION WITH CORONARY ANGIOGRAM (N/A) as a surgical intervention .  The patient's history has been reviewed, patient examined, no change in status, stable for surgery.  I have reviewed the patient's chart and labs.  Questions were answered to the patient's satisfaction.   Cath Lab Visit (complete for each Cath Lab visit)  Clinical Evaluation Leading to the Procedure:   ACS: Yes.    Non-ACS:    Anginal Classification: CCS III  Anti-ischemic medical therapy: Minimal Therapy (1 class of medications)  Non-Invasive Test Results: Intermediate-risk stress test findings: cardiac mortality 1-3%/year  Prior CABG: No previous CABG        Collier Salina [facility] 06/07/2014 8:10 AM

## 2014-06-07 NOTE — Progress Notes (Addendum)
[facility] Progress Note   Colin Lee EVO:[id number] DOB: [birth year] DOA: 05/30/2014 PCP: Bronson Curb, Colin Lee  Brief narrative: Colin Lee is a 54 y.o. male admitted with a complaint of chest pain and shortness of breath. He tells me that his pain was present in the center of the chest and mostly occurs when he exerts himself. He discussed this with Colin. [name] last month and was recommended to see cardiology. He followed up with [facility] and underwent a Myoview stress test which was found to be normal. He is currently not having any chest pain and again tells me it is mostly when he exerts himself and lifts heavy items.   Subjective: S/p cath. No complaints.  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 with DES  Active Problems:  Pleural effusion- fevers -He underwent a talc pleurodesis of left-sided effusion in October 2015 after multiple recurrences after drainage - an etiology was extensively investigated by no cause was found -Now presented with moderate right-sided effusion along with fevers and CT suggestive for pneumonia  -As mentioned above the patient had a normal 2-D echo on 2/18 and therefore effusion is likely not secondary to heart failure - He has been evaluated by CT surgery and they have determined that rather than placing a chest tube, a thoracentesis should be performed first - this has been done-- CXR reveals only a small residual effusion-fluid is bloody with significant amount of Lymphocytes- have consulted pulmonary  and ID to assist with management- they suspect it is non-infectious- high suspicion this is rheumatologic - ID doubts TB or any other infectious cause-- ID signed off - ANA again positive for speckled pattern at a very high dilution 1:1280 (third positive test since last summer) - ESR 94- C-ANCA 1:80 - current pleural fluid analysis with numerous lymphocytes and clinical symptom of fever is suspicious for a lupus related effusion-  started steroids on 1/27 - has on and off diffuse muscle and joint aches- has never had joint swelling - I spoke with his rheumatologist, Colin Lee on 1/29 with Colin Lee who tells me that the positive ANA is non- specific and the more specific tests that were done in the office were negative.  I stopped steroids on 1/29 after this conversation. 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 He has also recommended the following tests- Anti-smith ab, RNA Ab, MPO/PR3 ab     CAP (community acquired pneumonia)? -Patient admits to having a cough for 2 weeks-also states that he is been having subjective fevers for about 3 weeks -CT scan reveals that he has pneumonia-he was been placed on Levaquin which has been discontinued by ID - symptoms of cough and fevers likely related to above effusion rather than  infection   Hypertension -Continue metoprolol and ARB    Code Status: Full code Family Communication:  Disposition Plan:  DVT prophylaxis: Lovenox  Consultants: CT surgery  Procedures:   Antibiotics: Anti-infectives    Start     Dose/Rate Route Frequency Ordered Stop   05/31/14 2200  levofloxacin  (LEVAQUIN) IVPB 750 mg  Status:  Discontinued    Comments:  Levaquin 750 mg IV q24h for CrCl > 50 mL/min   750 mg 100 mL/hr over 90 Minutes Intravenous Every 24 hours 05/31/14 0530 06/03/14 1649   05/31/14 0130  levofloxacin (LEVAQUIN) IVPB 750 mg     750 mg 100 mL/hr over 90 Minutes Intravenous  Once 05/31/14 0125 05/31/14 0335         Objective: Filed Weights   06/04/14 2209 06/05/14 2126 06/06/14 1054  Weight: 123.832 kg (273 lb) 77.157 kg (170 lb 1.6 oz) 122.8 kg (270 lb 11.6 oz)    Intake/Output Summary (Last 24 hours) at 06/07/14 1527 Last data filed at 06/07/14 0500  Gross per 24 hour  Intake      0 ml  Output   1850 ml  Net  -1850 ml     Vitals Filed Vitals:   06/06/14 2100 06/07/14 0500 06/07/14 0745 06/07/14 1136  BP: 115/51 158/80  143/74  Pulse: 88 80 92   Temp: 97.7 F (36.5 C) 98.5 F (36.9 C)  97.3 F (36.3 C)  TempSrc: Oral Oral  Oral  Resp: '16 18  18  ' Height:      Weight:      SpO2: 96% 95%  97%    Exam: General: Awake alert oriented 3, No acute respiratory distress Lungs: Clear to auscultation bilaterally without wheezes or crackles Cardiovascular: Regular rate and rhythm without murmur gallop or rub normal S1 and S2 Abdomen: Nontender, nondistended, soft, bowel sounds positive, no rebound, no ascites, no appreciable mass Extremities: No significant cyanosis, clubbing, or edema bilateral lower extremities  Data Reviewed: Basic Metabolic Panel:  Recent Labs Lab 06/06/14 0431  NA 136  K 4.7  CL 100  CO2 28  GLUCOSE 241*  BUN 17  CREATININE 0.93  CALCIUM 9.2   Liver Function Tests: No results for input(s): AST, ALT, ALKPHOS, BILITOT, PROT, ALBUMIN in the last 168 hours. No results for input(s): LIPASE, AMYLASE in the last 168 hours. No results for input(s): AMMONIA in the last 168 hours. CBC:  Recent Labs Lab 06/01/14 0605 06/02/14 0547 06/06/14 0431 06/07/14 0623  WBC 7.4 8.7 25.7* 17.7*  HGB 11.1* 11.6* 11.8* 11.4*  HCT  33.7* 35.8* 36.1* 35.4*  MCV 79.7 81.7 82.8 82.1  PLT 396 393 540* 462*   Cardiac Enzymes: No results for input(s): CKTOTAL, CKMB, CKMBINDEX, TROPONINI in the last 168 hours. BNP (last 3 results)  Recent Labs  05/30/14 2150  BNP 92.5    ProBNP (last 3 results)  Recent Labs  10/06/13 1942  PROBNP 79.8    CBG:  Recent Labs Lab 06/01/14 0756 06/01/14 1220 06/01/14 1712 06/02/14 0008 06/02/14 0611  GLUCAP 105* 101* 100* 95 96    Recent Results (from the past 240 hour(s))  Body fluid culture with gram stain     Status: None   Collection Time: 06/01/14 10:59 AM  Result Value Ref Range Status   Specimen Description FLUID PLEURAL RIGHT  Final   Special Requests NONE  Final   Gram Stain   Final    FEW WBC PRESENT,BOTH PMN AND MONONUCLEAR NO ORGANISMS SEEN Performed at  [facility]    Culture   Final    NO GROWTH 3 DAYS Performed at [facility]    Report Status 06/04/2014 FINAL  Final  Culture, sputum-assessment     Status: None   Collection Time: 06/01/14  8:48 PM  Result Value Ref Range Status   Specimen Description SPUTUM  Final   Special Requests NONE  Final   Sputum evaluation   Final    THIS SPECIMEN IS ACCEPTABLE. RESPIRATORY CULTURE REPORT TO FOLLOW.   Report Status 06/01/2014 FINAL  Final  Culture, respiratory (NON-Expectorated)     Status: None   Collection Time: 06/01/14  8:48 PM  Result Value Ref Range Status   Specimen Description SPUTUM  Final   Special Requests NONE  Final   Gram Stain   Final    MODERATE WBC PRESENT,BOTH PMN AND MONONUCLEAR RARE SQUAMOUS EPITHELIAL CELLS PRESENT FEW GRAM POSITIVE COCCI IN PAIRS IN CLUSTERS Performed at [facility]    Culture   Final    NORMAL OROPHARYNGEAL FLORA Performed at [facility]    Report Status 06/04/2014 FINAL  Final  Surgical pcr screen     Status: None   Collection Time: 06/05/14  9:59 PM  Result Value Ref Range Status   MRSA, PCR NEGATIVE NEGATIVE Final    Staphylococcus aureus NEGATIVE NEGATIVE Final    Comment:        The Xpert SA Assay (FDA approved for NASAL specimens in patients over 55 years of age), is one component of a comprehensive surveillance program.  Test performance has been validated by [facility] for patients greater than or equal to 59 year old. It is not intended to diagnose infection nor to guide or monitor treatment.      Studies:  Recent x-ray studies have been reviewed in detail by the Attending Physician  Scheduled Meds:  Scheduled Meds: . [START ON 06/08/2014] aspirin  81 mg Oral Daily  . atorvastatin  40 mg Oral q1800  . benzonatate  100 mg Oral TID  . losartan  50 mg Oral Daily  . metoprolol tartrate  12.5 mg Oral BID  . nitroGLYCERIN      . nitroGLYCERIN      . pantoprazole  40 mg Oral Daily  . polyethylene glycol  17 g Oral Daily  . predniSONE  60 mg Oral Q breakfast  . rOPINIRole  0.25 mg Oral QHS  . sodium chloride  3 mL Intravenous Q12H  . sodium chloride  3 mL Intravenous Q12H  . ticagrelor  90 mg Oral BID   Continuous Infusions: . sodium chloride 1 mL/kg/hr (06/07/14 1030)  . nitroGLYCERIN 10 mcg/min (06/07/14 1437)    Time spent on care of this patient: 35 minutes   [place], MD 06/07/2014, 3:27 PM  LOS: 7 days   [facility] Office  [phone] Pager - Text Page per [email or web address]  If 7PM-7AM, please contact Lee-coverage [email or web address]

## 2014-06-08 ENCOUNTER — Encounter (HOSPITAL_COMMUNITY)
Admission: EM | Disposition: A | Discharge status: Home / Self Care | Payer: Self-pay | Source: Home / Self Care | Attending: Internal Medicine

## 2014-06-08 DIAGNOSIS — I200 Unstable angina: Secondary | ICD-10-CM

## 2014-06-08 DIAGNOSIS — R7989 Other specified abnormal findings of blood chemistry: Secondary | ICD-10-CM

## 2014-06-08 DIAGNOSIS — Z955 Presence of coronary angioplasty implant and graft: Secondary | ICD-10-CM

## 2014-06-08 DIAGNOSIS — R079 Chest pain, unspecified: Secondary | ICD-10-CM

## 2014-06-08 DIAGNOSIS — R778 Other specified abnormalities of plasma proteins: Secondary | ICD-10-CM | POA: Insufficient documentation

## 2014-06-08 HISTORY — PX: LEFT HEART CATHETERIZATION WITH CORONARY ANGIOGRAM: SHX5451

## 2014-06-08 LAB — URINALYSIS, ROUTINE W REFLEX MICROSCOPIC
Bilirubin Urine: NEGATIVE
Glucose, UA: NEGATIVE mg/dL
Hgb urine dipstick: NEGATIVE
Ketones, ur: NEGATIVE mg/dL
Leukocytes, UA: NEGATIVE
Nitrite: NEGATIVE
Protein, ur: NEGATIVE mg/dL
Specific Gravity, Urine: 1.03 (ref 1.005–1.030)
Urobilinogen, UA: 1 mg/dL (ref 0.0–1.0)
pH: 7 (ref 5.0–8.0)

## 2014-06-08 LAB — CBC
HCT: 35.9 % — ABNORMAL LOW (ref 39.0–52.0)
Hemoglobin: 11.6 g/dL — ABNORMAL LOW (ref 13.0–17.0)
MCH: 26.5 pg (ref 26.0–34.0)
MCHC: 32.3 g/dL (ref 30.0–36.0)
MCV: 82 fL (ref 78.0–100.0)
Platelets: 485 10*3/uL — ABNORMAL HIGH (ref 150–400)
RBC: 4.38 MIL/uL (ref 4.22–5.81)
RDW: 13.3 % (ref 11.5–15.5)
WBC: 16.1 10*3/uL — ABNORMAL HIGH (ref 4.0–10.5)

## 2014-06-08 LAB — BASIC METABOLIC PANEL
Anion gap: 7 (ref 5–15)
BUN: 14 mg/dL (ref 6–23)
CO2: 28 mmol/L (ref 19–32)
Calcium: 8.6 mg/dL (ref 8.4–10.5)
Chloride: 101 mmol/L (ref 96–112)
Creatinine, Ser: 0.92 mg/dL (ref 0.50–1.35)
GFR calc Af Amer: >90 mL/min (ref >90)
GFR calc non Af Amer: >90 mL/min (ref >90)
Glucose, Bld: 122 mg/dL — ABNORMAL HIGH (ref 70–99)
Potassium: 4.6 mmol/L (ref 3.5–5.1)
Sodium: 136 mmol/L (ref 135–145)

## 2014-06-08 LAB — TROPONIN I
Troponin I: 0.13 ng/mL — ABNORMAL HIGH (ref <0.031)
Troponin I: 0.14 ng/mL — ABNORMAL HIGH (ref <0.031)
Troponin I: 0.14 ng/mL — ABNORMAL HIGH (ref <0.031)

## 2014-06-08 LAB — ANTI-RIBONUCLEIC ACID ANTIBODY: Sm/rnp: <1

## 2014-06-08 LAB — ANTI-SMITH ANTIBODY: ENA SM Ab Ser-aCnc: <1

## 2014-06-08 SURGERY — LEFT HEART CATHETERIZATION WITH CORONARY ANGIOGRAM

## 2014-06-08 MED ORDER — SODIUM CHLORIDE 0.9 % IV SOLN
1.0000 mL/kg/h | INTRAVENOUS | Status: DC
  Administered 2014-06-08: 13:00:00 1 mL/kg/h via INTRAVENOUS

## 2014-06-08 MED ORDER — OXYCODONE-ACETAMINOPHEN 5-325 MG PO TABS: 1.0000 | ORAL_TABLET | ORAL | Status: DC | PRN

## 2014-06-08 MED ORDER — SODIUM CHLORIDE 0.9 % IJ SOLN
3.0000 mL | Freq: Two times a day (BID) | INTRAMUSCULAR | Status: DC
  Administered 2014-06-08: 11:00:00 3 mL via INTRAVENOUS

## 2014-06-08 MED ORDER — VERAPAMIL HCL 2.5 MG/ML IV SOLN
INTRAVENOUS | Status: AC
  Filled 2014-06-08: qty 2

## 2014-06-08 MED ORDER — SODIUM CHLORIDE 0.9 % IV SOLN: 250.0000 mL | INTRAVENOUS | Status: DC | PRN

## 2014-06-08 MED ORDER — SODIUM CHLORIDE 0.9 % IJ SOLN: 3.0000 mL | INTRAMUSCULAR | Status: DC | PRN

## 2014-06-08 MED ORDER — FENTANYL CITRATE 0.05 MG/ML IJ SOLN
INTRAMUSCULAR | Status: AC
  Filled 2014-06-08: qty 2

## 2014-06-08 MED ORDER — ACTIVE PARTNERSHIP FOR HEALTH OF YOUR HEART BOOK
Freq: Once | Status: AC
  Administered 2014-06-08: 21:00:00
  Filled 2014-06-08: qty 1

## 2014-06-08 MED ORDER — ONDANSETRON HCL 4 MG/2ML IJ SOLN: 4.0000 mg | Freq: Four times a day (QID) | INTRAMUSCULAR | Status: DC | PRN

## 2014-06-08 MED ORDER — LIDOCAINE HCL (PF) 1 % IJ SOLN
INTRAMUSCULAR | Status: AC
  Filled 2014-06-08: qty 30

## 2014-06-08 MED ORDER — ASPIRIN 81 MG PO CHEW
81.0000 mg | CHEWABLE_TABLET | Freq: Every day | ORAL | Status: DC
  Administered 2014-06-09: 11:00:00 81 mg via ORAL
  Filled 2014-06-08: qty 1

## 2014-06-08 MED ORDER — SODIUM CHLORIDE 0.9 % IV SOLN: INTRAVENOUS | Status: AC

## 2014-06-08 MED ORDER — HEPARIN (PORCINE) IN NACL 2-0.9 UNIT/ML-% IJ SOLN
INTRAMUSCULAR | Status: AC
  Filled 2014-06-08: qty 1000

## 2014-06-08 MED ORDER — MORPHINE SULFATE 2 MG/ML IJ SOLN: 2.0000 mg | INTRAMUSCULAR | Status: DC | PRN

## 2014-06-08 MED ORDER — NITROGLYCERIN 1 MG/10 ML FOR IR/CATH LAB
INTRA_ARTERIAL | Status: AC
  Filled 2014-06-08: qty 10

## 2014-06-08 MED ORDER — ASPIRIN 81 MG PO CHEW
324.0000 mg | CHEWABLE_TABLET | ORAL | Status: AC
  Administered 2014-06-08: 11:00:00 324 mg via ORAL
  Filled 2014-06-08: qty 4

## 2014-06-08 MED ORDER — MIDAZOLAM HCL 2 MG/2ML IJ SOLN
INTRAMUSCULAR | Status: AC
  Filled 2014-06-08: qty 2

## 2014-06-08 MED FILL — Sodium Chloride IV Soln 0.9%: INTRAVENOUS | Qty: 50 | Status: AC

## 2014-06-08 NOTE — Progress Notes (Signed)
TR BAND REMOVAL  LOCATION:    right radial  DEFLATED PER PROTOCOL:    Yes.    TIME BAND OFF / DRESSING APPLIED:    1700   SITE UPON ARRIVAL:    Level 0  SITE AFTER BAND REMOVAL:    Level 0  REVERSE ALLEN'S TEST:     positive  CIRCULATION SENSATION AND MOVEMENT:    Within Normal Limits   Yes.    COMMENTS:   Tolerated procedure well 

## 2014-06-08 NOTE — CV Procedure (Signed)
     Left Heart Catheterization with Coronary Angiography Report  Colin Lee  54 y.o.  male [birth year]  Procedure Date: 06/08/2014 Referring Physician: Peter Martinique Primary Cardiologist: Fransico Him, MD  INDICATIONS: Chest pain post RCA stent. Inferior T-wave abnormality noted on a.m. EKG. IV nitroglycerin restarted.  PROCEDURE: 1. Coronary angiography  CONSENT:  The risks, benefits, and details of the procedure were explained in detail to the patient. Risks including death, stroke, heart attack, kidney injury, allergy, limb ischemia, bleeding and radiation injury were discussed.  The patient verbalized understanding and wanted to proceed.  Informed written consent was obtained.  PROCEDURE TECHNIQUE:  After Xylocaine anesthesia a 5 French Slender sheath was placed in the right radial artery with an angiocath and the modified Seldinger technique.  Coronary angiography was done using a 5 F JR4 and JL 3.5 cm diagnostic catheter.  Left ventriculography was not done.   Hemostasis was achieved with a wrist band.   CONTRAST:  Total of 55 cc.  COMPLICATIONS:  None   HEMODYNAMICS:  Aortic pressure 133/83 mmHg  ANGIOGRAPHIC DATA:   The left main coronary artery is widely patent and unchanged.  The left anterior descending artery is widely patent and unchanged from images obtained yesterday..  The left circumflex artery is widely patent and unchanged.  The right coronary artery is widely patent without evidence of thrombus, dissection, or other abnormality.Marland Kitchen   LEFT VENTRICULOGRAM:  Left ventricular angiogram was not performed   IMPRESSIONS:  Widely patent coronary arteries including the recently stented RCA   RECOMMENDATION:  Discontinue IV nitroglycerin Discontinue heparin Ambulate Plan discharge in a.m.

## 2014-06-08 NOTE — Progress Notes (Signed)
[id number] Did not walk with pt as he is going back to cath lab. Did complete education with pt and wife re NTG use, stent/brilinta, risk factors, diet , ex and CRP 2. Gave brilinta booklet. Discussed CRP 2 and permission given to refer to [facility] program. Pt and wife motivated to walk for ex and watch diet as they both stated would like to lose weight again. They lost weight in past but have regained it. Will follow up tomorrow if not discharged. Graylon Good RN BSN 06/08/2014 9:00 AM

## 2014-06-08 NOTE — Interval H&P Note (Signed)
Cath Lab Visit (complete for each Cath Lab visit)  Clinical Evaluation Leading to the Procedure:   ACS: Yes.    Non-ACS:    Anginal Classification: CCS IV  Anti-ischemic medical therapy: Maximal Therapy (2 or more classes of medications)  Non-Invasive Test Results: No non-invasive testing performed  Prior CABG: No previous CABG      History and Physical Interval Note:  06/08/2014 1:50 PM  Colin Lee  has presented today for surgery, with the diagnosis of relook  The various methods of treatment have been discussed with the patient and family. After consideration of risks, benefits and other options for treatment, the patient has consented to  Procedure(s): LEFT HEART CATHETERIZATION WITH CORONARY ANGIOGRAM (N/A) as a surgical intervention .  The patient's history has been reviewed, patient examined, no change in status, stable for surgery.  I have reviewed the patient's chart and labs.  Questions were answered to the patient's satisfaction.     Sinclair Grooms

## 2014-06-08 NOTE — Care Management Note (Unsigned)
    Page 1 of 1   06/09/2014     10:26:20 AM CARE MANAGEMENT NOTE 06/09/2014  Patient:  Colin Lee, Colin Lee   Account Number:  [id number]  Date Initiated:  06/06/2014  Documentation initiated by:  Luz Lex  Subjective/Objective Assessment:     Action/Plan:   Anticipated DC Date:     Anticipated DC Plan:           Choice offered to / List presented to:             Status of service:   Medicare Important Message given?  NO (If response is "NO", the following Medicare IM given date fields will be blank) Date Medicare IM given:   Medicare IM given by:   Date Additional Medicare IM given:   Additional Medicare IM given by:    Discharge Disposition:    Per UR Regulation:  Reviewed for med. necessity/level of care/duration of stay  If discussed at [place] of Stay Meetings, dates discussed:   06/09/2014    Comments:   06-09-14 [address], RN,BSN [place] has Brilinta available. No further needs from CM at this time.  06-08-14 [place], RN, BSN [phone] Pt post cath Monday revealed cp- initiated on IV nitro gtt and back to cath today. Per MD notes patent coronary arteries including the recently stented RCA. Plan for home 06-09-14. CM will continue to monitor for disposition needs.   06-07-14 1:30pm [place] [phone] Brilinta benefits check BRILINTA: $64 30 DAY RETAIL/ $192 90 DAY RETAIL OR MAIL ORDER  NO AUTH REQUIRED  PATIENT CAN USE: MOST MAJOR RETAIL PHARMACIES  06-06-14 3pm Luz Lex, RNBSN - [phone] [employer] shiled [place] called to let us know patient in already precertified for [place] with any agency he chooses.

## 2014-06-08 NOTE — Progress Notes (Signed)
[facility] PROGRESS NOTE  MICAH BARNIER VEH:[id number] DOB: 07/05/1960 DOA: 05/30/2014 PCP: Bronson Curb, PA-C Interim summary: 54 y.o. Male admitted for chest pain and sob. He underwent outpatient myoview stress and was found to be negative. But he continued to have symptoms and was admitted to the hospital for unstable angina. Cardiology consulted and he underwent cardiac cath on 3/1, was found to have 90% stenosis in the proximal RCA, followed by successful stenting of the proximal RCA with DES. But post procedure patient developed right sided and sub sternal chest pain and he was started on IV ntg. Plan for cath and angiography today.   Assessment/Plan: 1. Unstable angina, s/p cardiac cath showing severe stenosis of the proximal RCA:  - s/p  DES in RCA. DAPT for one year.  - persistent chest pain since the procedure improved with NTG. Re evaluation today with angiography.  - continue with lipitor, bb, aspirin and cozaar.  Echocardiogram showed good LVEF.   2. Recurrent pleural effusions:  S/p talc pleurodesis in oct 2015. On this admission, he was found to have moderate pleural effusion and community acquired pneumonia. He underwent thoracocentesis and was found to have bloody effusion with fluid analysis showing lymphocytes. ID and pulmonary consulted and suggested this could be rheumatologic in origin and rheumatology work up ordered. Meanwhile his case was discussed with Dr Charlestine Night and recommended outpatient follow up as soon as possibly. Meanwhile patient is on 20 mg daily of prednisone and plan to continue that till he sees Dr Charlestine Night as outpatient.   3. Leukocytosis: probably from steroids.   4. Community acquired pneumonia: -  Completed course of levaquin.    5. Hypertension; controlled.     Code Status: full code.  Family Communication: spouse at bedside Disposition Plan:    Consultants:  cardiology  Procedures:  Cardiac cath on  3/1  Antibiotics:  none  HPI/Subjective: Mild headache after NTg was started.   Objective: Filed Vitals:   06/08/14 1200  BP: 155/87  Pulse: 97  Temp: 98.5 F (36.9 C)  Resp: 20    Intake/Output Summary (Last 24 hours) at 06/08/14 1349 Last data filed at 06/08/14 0803  Gross per 24 hour  Intake 1919.8 ml  Output   3300 ml  Net -1380.2 ml   Filed Weights   06/05/14 2126 06/06/14 1054 06/08/14 0100  Weight: 77.157 kg (170 lb 1.6 oz) 122.8 kg (270 lb 11.6 oz) 123 kg (271 lb 2.7 oz)    Exam:   General:  Alert afebrile comfortable, not in any distress  Cardiovascular: s1s2 tachycardic,   Respiratory: clear to auscultation, no wheezing or rhonchi  Abdomen: soft non tender non distended bowel sounds good  Musculoskeletal: no pedal edema, cyanosis or clubbing.   Data Reviewed: Basic Metabolic Panel:  Recent Labs Lab 06/06/14 0431 06/08/14 0445  NA 136 136  K 4.7 4.6  CL 100 101  CO2 28 28  GLUCOSE 241* 122*  BUN 17 14  CREATININE 0.93 0.92  CALCIUM 9.2 8.6   Liver Function Tests: No results for input(s): AST, ALT, ALKPHOS, BILITOT, PROT, ALBUMIN in the last 168 hours. No results for input(s): LIPASE, AMYLASE in the last 168 hours. No results for input(s): AMMONIA in the last 168 hours. CBC:  Recent Labs Lab 06/02/14 0547 06/06/14 0431 06/07/14 0623 06/08/14 0445  WBC 8.7 25.7* 17.7* 16.1*  HGB 11.6* 11.8* 11.4* 11.6*  HCT 35.8* 36.1* 35.4* 35.9*  MCV 81.7 82.8 82.1 82.0  PLT 393 540* 462*  485*   Cardiac Enzymes:  Recent Labs Lab 06/08/14 0838  TROPONINI 0.13*   BNP (last 3 results)  Recent Labs  05/30/14 2150  BNP 92.5    ProBNP (last 3 results)  Recent Labs  10/06/13 1942  PROBNP 79.8    CBG:  Recent Labs Lab 06/01/14 1712 06/02/14 0008 06/02/14 0611  GLUCAP 100* 95 96    Recent Results (from the past 240 hour(s))  Body fluid culture with gram stain     Status: None   Collection Time: 06/01/14 10:59 AM  Result  Value Ref Range Status   Specimen Description FLUID PLEURAL RIGHT  Final   Special Requests NONE  Final   Gram Stain   Final    FEW WBC PRESENT,BOTH PMN AND MONONUCLEAR NO ORGANISMS SEEN Performed at [facility]    Culture   Final    NO GROWTH 3 DAYS Performed at [facility]    Report Status 06/04/2014 FINAL  Final  Culture, sputum-assessment     Status: None   Collection Time: 06/01/14  8:48 PM  Result Value Ref Range Status   Specimen Description SPUTUM  Final   Special Requests NONE  Final   Sputum evaluation   Final    THIS SPECIMEN IS ACCEPTABLE. RESPIRATORY CULTURE REPORT TO FOLLOW.   Report Status 06/01/2014 FINAL  Final  Culture, respiratory (NON-Expectorated)     Status: None   Collection Time: 06/01/14  8:48 PM  Result Value Ref Range Status   Specimen Description SPUTUM  Final   Special Requests NONE  Final   Gram Stain   Final    MODERATE WBC PRESENT,BOTH PMN AND MONONUCLEAR RARE SQUAMOUS EPITHELIAL CELLS PRESENT FEW GRAM POSITIVE COCCI IN PAIRS IN CLUSTERS Performed at [facility]    Culture   Final    NORMAL OROPHARYNGEAL FLORA Performed at [facility]    Report Status 06/04/2014 FINAL  Final  Surgical pcr screen     Status: None   Collection Time: 06/05/14  9:59 PM  Result Value Ref Range Status   MRSA, PCR NEGATIVE NEGATIVE Final   Staphylococcus aureus NEGATIVE NEGATIVE Final    Comment:        The Xpert SA Assay (FDA approved for NASAL specimens in patients over 53 years of age), is one component of a comprehensive surveillance program.  Test performance has been validated by [facility] for patients greater than or equal to 85 year old. It is not intended to diagnose infection nor to guide or monitor treatment.      Studies: No results found.  Scheduled Meds: . [START ON 06/09/2014] aspirin  81 mg Oral Daily  . atorvastatin  40 mg Oral q1800  . benzonatate  100 mg Oral TID  . losartan  50 mg Oral  Daily  . metoprolol tartrate  12.5 mg Oral BID  . pantoprazole  40 mg Oral Daily  . polyethylene glycol  17 g Oral Daily  . predniSONE  20 mg Oral Q breakfast  . rOPINIRole  0.25 mg Oral QHS  . sodium chloride  3 mL Intravenous Q12H  . sodium chloride  3 mL Intravenous Q12H  . sodium chloride  3 mL Intravenous Q12H  . ticagrelor  90 mg Oral BID   Continuous Infusions: . sodium chloride 1 mL/kg/hr (06/08/14 1321)  . nitroGLYCERIN 13.333 mcg/min (06/08/14 0600)    Principal Problem:   Pleural effusion exudative Active Problems:   Hypertension   Pulmonary infiltrate  Low grade fever   Myalgia   Arthralgia   Normocytic anemia   Angina pectoris   Unstable angina    Time spent: 25 minutes    [place] [facility] Pager [phone]  If 7PM-7AM, please contact night-coverage at [email or web address], password [facility] 06/08/2014, 1:49 PM  LOS: 8 days

## 2014-06-08 NOTE — H&P (View-Only) (Signed)
Patient Name: Colin Lee Date of Encounter: 06/08/2014  Principal Problem:   Pleural effusion exudative Active Problems:   Hypertension   Pulmonary infiltrate   Low grade fever   Myalgia   Arthralgia   Normocytic anemia   Angina pectoris   Unstable angina   Primary Cardiologist: Dr. Radford Pax  Patient Profile: 54 yo male w/ no previous history of CAD, history of left pleural effusion, now with a right pleural effusion, fevers, elevated sedimentation rate, positive ANA, suspicious for lupus who also was having exertional chest pain, cardiac CT showed possible high-grade stenosis. Catheterization on 03/01 showed high-grade RCA disease with minimal/moderate left main disease and a preserved EF. He had 2 drug-eluting stents to the RCA placed.  SUBJECTIVE: He had an episode of night sweats last p.m. which he has been having for months. He also developed chest pain, that was more severe and has been going on all night long. It improved with IV nitroglycerin, but he developed a headache. Currently his chest pain is a 2/10.  Per nursing staff, the chest pain started yesterday afternoon, nitroglycerin at one point was 40 mcg/min, but was weaned down due to headache and the patient denying chest pain.  OBJECTIVE Filed Vitals:   06/07/14 2339 06/08/14 0100 06/08/14 0250 06/08/14 0456  BP: 93/49  109/79 122/64  Pulse: 82   87  Temp: 98.8 F (37.1 C)   98.4 F (36.9 C)  TempSrc: Oral   Oral  Resp: 17   20  Height:      Weight:  271 lb 2.7 oz (123 kg)    SpO2: 93%   95%    Intake/Output Summary (Last 24 hours) at 06/08/14 0723 Last data filed at 06/08/14 0456  Gross per 24 hour  Intake 2039.8 ml  Output   2500 ml  Net -460.2 ml   Filed Weights   06/05/14 2126 06/06/14 1054 06/08/14 0100  Weight: 170 lb 1.6 oz (77.157 kg) 270 lb 11.6 oz (122.8 kg) 271 lb 2.7 oz (123 kg)    PHYSICAL EXAM General: Well developed, well nourished, male in no acute distress. Head:  Normocephalic, atraumatic.  Neck: Supple without bruits, JVD not elevated  Lungs:  Resp regular and unlabored, CTA. Heart: RRR, S1, S2, no S3, S4, or murmur; no rub. Abdomen: Soft, non-tender, non-distended, BS + x 4.  Extremities: No clubbing, cyanosis, no edema. Right radial cath site without ecchymosis or hematoma Neuro: Alert and oriented X 3. Moves all extremities spontaneously. Psych: Normal affect.  LABS: CBC: Recent Labs  06/07/14 0623 06/08/14 0445  WBC 17.7* 16.1*  HGB 11.4* 11.6*  HCT 35.4* 35.9*  MCV 82.1 82.0  PLT 462* 485*   INR: Recent Labs  06/06/14 0431  INR 5.05   Basic Metabolic Panel: Recent Labs  06/06/14 0431 06/08/14 0445  NA 136 136  K 4.7 4.6  CL 100 101  CO2 28 28  GLUCOSE 241* 122*  BUN 17 14  CREATININE 0.93 0.92  CALCIUM 9.2 8.6   BNP:  B NATRIURETIC PEPTIDE  Date/Time Value Ref Range Status  05/30/2014 09:50 PM 92.5 0.0 - 100.0 pg/mL Final   Cardiac Cath: 06/07/2014 PROCEDURAL FINDINGS Hemodynamics: AO 134/84 mean 106 mm Hg LV 134/20 mm Hg Coronary angiography: Coronary dominance: right Left mainstem: 30% ostial stenosis Left anterior descending (LAD): Normal.  Left circumflex (LCx): Normal Right coronary artery (RCA): There is a complex 90% stenosis in the proximal vessel with ulceration. Left ventriculography: Left ventricular systolic  function is normal, LVEF is estimated at 55-65%, there is no significant mitral regurgitation  PCI Data: Vessel - RCA/Segment - proximal Percent Stenosis (pre) 90% TIMI-flow 3 Stent 4.0 x 20 and 4.0 x 12 mm Promus stents Percent Stenosis (post) 0% TIMI-flow (post) 3 Final Conclusions:  1. Single vessel obstructive CAD 2. Normal LV function. 3. Successful stenting of the proximal RCA with DES.  Recommendations:  DAPT for one year.   EKG: 06/08/2014 Sinus rhythm, inferior T-wave inversions not seen on previous ECG.  Radiology/Studies: No results found.    Current Medications:  . aspirin  81 mg Oral Daily  . atorvastatin  40 mg Oral q1800  . benzonatate  100 mg Oral TID  . losartan  50 mg Oral Daily  . metoprolol tartrate  12.5 mg Oral BID  . pantoprazole  40 mg Oral Daily  . polyethylene glycol  17 g Oral Daily  . predniSONE  20 mg Oral Q breakfast  . rOPINIRole  0.25 mg Oral QHS  . sodium chloride  3 mL Intravenous Q12H  . sodium chloride  3 mL Intravenous Q12H  . ticagrelor  90 mg Oral BID   . nitroGLYCERIN 12 mcg/min (06/07/14 1900)    ASSESSMENT AND PLAN:   Angina pectoris &   Unstable angina- status post cath on 03/01 with high-grade RCA disease treated with 2 drug-eluting stents. He initially tolerated the procedure well but then after getting up to the floor, developed chest pain which was treated with IV nitroglycerin. The nitroglycerin improved his pain but he developed a headache in the nitroglycerin was weaned down. His pain is ongoing at a 2/10 currently. We will check a troponin, will make nothing by mouth. His ECG is newly abnormal. He needs a restudy, will get this later today.  Otherwise, per IM Principal Problem:   Pleural effusion exudative Active Problems:   Hypertension   Pulmonary infiltrate   Low grade fever   Myalgia   Arthralgia   Normocytic anemia    Signed, Rosaria Ferries , PA-C 7:23 AM 06/08/2014 Patient seen and examined and history reviewed. Agree with above findings and plan. Patient developed recurrent chest pain last night. Was placed back on IV Ntg. Ecg this am shows new T wave inversion in the inferior leads. He continues to have mild chest pain- similar to presenting pain but not as severe. Will check Troponin level. I recommend relook angiography today. If stents patent he could be DC later today but I think his symptoms and Ecg changes require reevaluation.  Peter Martinique, [place] 06/08/2014 7:53 AM

## 2014-06-08 NOTE — Progress Notes (Signed)
Patient Name: Colin Lee Date of Encounter: 06/08/2014  Principal Problem:   Pleural effusion exudative Active Problems:   Hypertension   Pulmonary infiltrate   Low grade fever   Myalgia   Arthralgia   Normocytic anemia   Angina pectoris   Unstable angina   Primary Cardiologist: Dr. Radford Pax  Patient Profile: 54 yo male w/ no previous history of CAD, history of left pleural effusion, now with a right pleural effusion, fevers, elevated sedimentation rate, positive ANA, suspicious for lupus who also was having exertional chest pain, cardiac CT showed possible high-grade stenosis. Catheterization on 03/01 showed high-grade RCA disease with minimal/moderate left main disease and a preserved EF. He had 2 drug-eluting stents to the RCA placed.  SUBJECTIVE: He had an episode of night sweats last p.m. which he has been having for months. He also developed chest pain, that was more severe and has been going on all night long. It improved with IV nitroglycerin, but he developed a headache. Currently his chest pain is a 2/10.  Per nursing staff, the chest pain started yesterday afternoon, nitroglycerin at one point was 40 mcg/min, but was weaned down due to headache and the patient denying chest pain.  OBJECTIVE Filed Vitals:   06/07/14 2339 06/08/14 0100 06/08/14 0250 06/08/14 0456  BP: 93/49  109/79 122/64  Pulse: 82   87  Temp: 98.8 F (37.1 C)   98.4 F (36.9 C)  TempSrc: Oral   Oral  Resp: 17   20  Height:      Weight:  271 lb 2.7 oz (123 kg)    SpO2: 93%   95%    Intake/Output Summary (Last 24 hours) at 06/08/14 0723 Last data filed at 06/08/14 0456  Gross per 24 hour  Intake 2039.8 ml  Output   2500 ml  Net -460.2 ml   Filed Weights   06/05/14 2126 06/06/14 1054 06/08/14 0100  Weight: 170 lb 1.6 oz (77.157 kg) 270 lb 11.6 oz (122.8 kg) 271 lb 2.7 oz (123 kg)    PHYSICAL EXAM General: Well developed, well nourished, male in no acute distress. Head:  Normocephalic, atraumatic.  Neck: Supple without bruits, JVD not elevated  Lungs:  Resp regular and unlabored, CTA. Heart: RRR, S1, S2, no S3, S4, or murmur; no rub. Abdomen: Soft, non-tender, non-distended, BS + x 4.  Extremities: No clubbing, cyanosis, no edema. Right radial cath site without ecchymosis or hematoma Neuro: Alert and oriented X 3. Moves all extremities spontaneously. Psych: Normal affect.  LABS: CBC: Recent Labs  06/07/14 0623 06/08/14 0445  WBC 17.7* 16.1*  HGB 11.4* 11.6*  HCT 35.4* 35.9*  MCV 82.1 82.0  PLT 462* 485*   INR: Recent Labs  06/06/14 0431  INR 2.77   Basic Metabolic Panel: Recent Labs  06/06/14 0431 06/08/14 0445  NA 136 136  K 4.7 4.6  CL 100 101  CO2 28 28  GLUCOSE 241* 122*  BUN 17 14  CREATININE 0.93 0.92  CALCIUM 9.2 8.6   BNP:  B NATRIURETIC PEPTIDE  Date/Time Value Ref Range Status  05/30/2014 09:50 PM 92.5 0.0 - 100.0 pg/mL Final   Cardiac Cath: 06/07/2014 PROCEDURAL FINDINGS Hemodynamics: AO 134/84 mean 106 mm Hg LV 134/20 mm Hg Coronary angiography: Coronary dominance: right Left mainstem: 30% ostial stenosis Left anterior descending (LAD): Normal.  Left circumflex (LCx): Normal Right coronary artery (RCA): There is a complex 90% stenosis in the proximal vessel with ulceration. Left ventriculography: Left ventricular systolic  function is normal, LVEF is estimated at 55-65%, there is no significant mitral regurgitation  PCI Data: Vessel - RCA/Segment - proximal Percent Stenosis (pre) 90% TIMI-flow 3 Stent 4.0 x 20 and 4.0 x 12 mm Promus stents Percent Stenosis (post) 0% TIMI-flow (post) 3 Final Conclusions:  1. Single vessel obstructive CAD 2. Normal LV function. 3. Successful stenting of the proximal RCA with DES.  Recommendations:  DAPT for one year.   EKG: 06/08/2014 Sinus rhythm, inferior T-wave inversions not seen on previous ECG.  Radiology/Studies: No results found.    Current Medications:  . aspirin  81 mg Oral Daily  . atorvastatin  40 mg Oral q1800  . benzonatate  100 mg Oral TID  . losartan  50 mg Oral Daily  . metoprolol tartrate  12.5 mg Oral BID  . pantoprazole  40 mg Oral Daily  . polyethylene glycol  17 g Oral Daily  . predniSONE  20 mg Oral Q breakfast  . rOPINIRole  0.25 mg Oral QHS  . sodium chloride  3 mL Intravenous Q12H  . sodium chloride  3 mL Intravenous Q12H  . ticagrelor  90 mg Oral BID   . nitroGLYCERIN 12 mcg/min (06/07/14 1900)    ASSESSMENT AND PLAN:   Angina pectoris &   Unstable angina- status post cath on 03/01 with high-grade RCA disease treated with 2 drug-eluting stents. He initially tolerated the procedure well but then after getting up to the floor, developed chest pain which was treated with IV nitroglycerin. The nitroglycerin improved his pain but he developed a headache in the nitroglycerin was weaned down. His pain is ongoing at a 2/10 currently. We will check a troponin, will make nothing by mouth. His ECG is newly abnormal. He needs a restudy, will get this later today.  Otherwise, per IM Principal Problem:   Pleural effusion exudative Active Problems:   Hypertension   Pulmonary infiltrate   Low grade fever   Myalgia   Arthralgia   Normocytic anemia    Signed, Rosaria Ferries , PA-C 7:23 AM 06/08/2014 Patient seen and examined and history reviewed. Agree with above findings and plan. Patient developed recurrent chest pain last night. Was placed back on IV Ntg. Ecg this am shows new T wave inversion in the inferior leads. He continues to have mild chest pain- similar to presenting pain but not as severe. Will check Troponin level. I recommend relook angiography today. If stents patent he could be DC later today but I think his symptoms and Ecg changes require reevaluation.  Peter Martinique, [place] 06/08/2014 7:53 AM

## 2014-06-09 ENCOUNTER — Other Ambulatory Visit: Payer: Self-pay | Admitting: Physician Assistant

## 2014-06-09 ENCOUNTER — Telehealth: Payer: Self-pay | Admitting: Cardiology

## 2014-06-09 ENCOUNTER — Encounter (HOSPITAL_COMMUNITY): Payer: Self-pay | Admitting: Interventional Cardiology

## 2014-06-09 DIAGNOSIS — R7989 Other specified abnormal findings of blood chemistry: Secondary | ICD-10-CM

## 2014-06-09 DIAGNOSIS — I25110 Atherosclerotic heart disease of native coronary artery with unstable angina pectoris: Secondary | ICD-10-CM

## 2014-06-09 DIAGNOSIS — Z9861 Coronary angioplasty status: Secondary | ICD-10-CM

## 2014-06-09 DIAGNOSIS — I2510 Atherosclerotic heart disease of native coronary artery without angina pectoris: Secondary | ICD-10-CM

## 2014-06-09 LAB — MPO/PR-3 (ANCA) ANTIBODIES
ANCA Proteinase 3: <3.5 U/mL (ref 0.0–3.5)
Myeloperoxidase Abs: <9 U/mL (ref 0.0–9.0)

## 2014-06-09 MED ORDER — METOPROLOL TARTRATE 25 MG PO TABS: 12.5000 mg | ORAL_TABLET | Freq: Two times a day (BID) | ORAL | Status: DC

## 2014-06-09 MED ORDER — PREDNISONE 20 MG PO TABS: 20.0000 mg | ORAL_TABLET | Freq: Every day | ORAL | Status: DC

## 2014-06-09 MED ORDER — ATORVASTATIN CALCIUM 40 MG PO TABS: 40.0000 mg | ORAL_TABLET | Freq: Every day | ORAL | Status: DC

## 2014-06-09 MED ORDER — TICAGRELOR 90 MG PO TABS: 90.0000 mg | ORAL_TABLET | Freq: Two times a day (BID) | ORAL | Status: DC

## 2014-06-09 NOTE — Progress Notes (Signed)
Pt stated he wanted to bathe at home after being discharged today.

## 2014-06-09 NOTE — Telephone Encounter (Signed)
New Msg        Pt has TCM appt 06/20/14 with Richardson Dopp per Melina Copa.

## 2014-06-09 NOTE — Discharge Instructions (Signed)
Instructions from Cardiology Patients taking Aspirin/Brilinta should generally stay away from medicines like ibuprofen, Advil, Motrin, naproxen, and Aleve due to risk of stomach bleeding and cardiovascular risks. You may take Tylenol as directed or talk to your primary doctor about alternatives.  No driving for 1 week. No lifting over 10 lbs for 2 weeks. No sexual activity for 2 weeks. From a cardiac standpoint, you may return to work in 1 week if feeling well, keeping in mind lifting restriction above. Your internal medicine doctor may have different recommendations about returning to work so please discuss with them. Keep procedure site clean & dry. If you notice increased pain, swelling, bleeding or pus, call/return!  You may shower, but no soaking baths/hot tubs/pools for 1 week.

## 2014-06-09 NOTE — Discharge Summary (Signed)
Physician Discharge Summary  Colin Lee WJX:[id number] DOB: [birth year] DOA: 05/30/2014  PCP: Bronson Curb, PA-C  Admit date: 05/30/2014 Discharge date: 06/09/2014  Time spent: 30 minutes  Recommendations for Outpatient Follow-up:  1. Follow up with PCP in one week to check cbc 2. Follow up with Dr Charlestine Night as recommended 3. Follow up with cardiology as recommended.   Discharge Diagnoses:  Principal Problem:   Pleural effusion exudative Active Problems:   Hypertension   Pulmonary infiltrate   Low grade fever   Myalgia   Arthralgia   Normocytic anemia   Angina pectoris   Unstable angina   Elevated troponin I level   Stented coronary artery   Discharge Condition: improved  Diet recommendation: low sodium diet.   Filed Weights   06/08/14 0100 06/09/14 0029 06/09/14 0500  Weight: 123 kg (271 lb 2.7 oz) 123.7 kg (272 lb 11.3 oz) 123.7 kg (272 lb 11.3 oz)    History of present illness:  54 y.o. Male admitted for chest pain and sob. He underwent outpatient myoview stress and was found to be negative. But he continued to have symptoms and was admitted to the hospital for unstable angina. Cardiology consulted and he underwent cardiac cath on 3/1, was found to have 90% stenosis in the proximal RCA, followed by successful stenting of the proximal RCA with DES. But post procedure patient developed right sided and sub sternal chest pain and he was started on IV ntg, he underwent angiography and his stents were found to be patent.NTG was discontinued and he was discharged home on oral prednisone.    Hospital Course:  1. Unstable angina, s/p cardiac cath showing severe stenosis of the proximal RCA: - s/p DES in RCA. DAPT for one year.  - persistent chest pain since the procedure improved with NTG. He underwent re evaluation on 3/3 with angiography, was found to have patent stents.  - continue with lipitor, bb, aspirin and cozaar.  Echocardiogram showed good LVEF.   2.  Recurrent pleural effusions:  S/p talc pleurodesis in oct 2015. On this admission, he was found to have moderate pleural effusion and community acquired pneumonia. He underwent thoracocentesis and was found to have bloody effusion with fluid analysis showing lymphocytes. ID and pulmonary consulted and suggested this could be rheumatologic in origin and rheumatology work up ordered. Meanwhile his case was discussed with Dr Charlestine Night and recommended outpatient follow up as soon as possibly. Meanwhile patient is on 20 mg daily of prednisone and plan to continue that till he sees Dr Charlestine Night as outpatient.   3. Leukocytosis: probably from steroids. Recommend outpatient follow up.   4. Community acquired pneumonia: - Completed course of levaquin.    5. Hypertension; controlled.   Procedures: Cardiac catheterization  Consultations:  cardiology  Discharge Exam: Filed Vitals:   06/09/14 1133  BP: 157/82  Pulse: 79  Temp: 98.2 F (36.8 C)  Resp: 18    General: alert afebrile comfortable Cardiovascular: s1s2 Respiratory: ctab  Discharge Instructions   Discharge Instructions    Amb Referral to Cardiac Rehabilitation    Complete by:  As directed      Diet - low sodium heart healthy    Complete by:  As directed      Discharge instructions    Complete by:  As directed   Follow up with PCP in one week.          Current Discharge Medication List    START taking these medications   Details  atorvastatin (LIPITOR) 40 MG tablet Take 1 tablet (40 mg total) by mouth daily at 6 PM. Qty: 30 tablet, Refills: 0    metoprolol tartrate (LOPRESSOR) 25 MG tablet Take 0.5 tablets (12.5 mg total) by mouth 2 (two) times daily. Qty: 60 tablet, Refills: 0    predniSONE (DELTASONE) 20 MG tablet Take 1 tablet (20 mg total) by mouth daily with breakfast. Qty: 15 tablet, Refills: 0    ticagrelor (BRILINTA) 90 MG TABS tablet Take 1 tablet (90 mg total) by mouth 2 (two) times daily. Qty: 60  tablet, Refills: 1      CONTINUE these medications which have NOT CHANGED   Details  albuterol (PROVENTIL HFA;VENTOLIN HFA) 108 (90 BASE) MCG/ACT inhaler Inhale 2 puffs into the lungs every 6 (six) hours as needed for wheezing or shortness of breath.    aspirin EC 81 MG tablet Take 81 mg by mouth daily.    losartan (COZAAR) 50 MG tablet Take 50 mg by mouth daily.    Multiple Vitamins-Minerals (CENTRUM ADULTS PO) Take 1 tablet by mouth daily.    omeprazole (PRILOSEC) 20 MG capsule Take 20 mg by mouth daily.    polyethylene glycol (MIRALAX / GLYCOLAX) packet Take 17 g by mouth daily. Qty: 30 each, Refills: 0    rOPINIRole (REQUIP) 0.25 MG tablet Take 1 tablet (0.25 mg total) by mouth at bedtime. Qty: 30 tablet, Refills: 0      STOP taking these medications     ibuprofen (ADVIL,MOTRIN) 800 MG tablet        No Known Allergies Follow-up Information    Follow up with Marijean Bravo, MD On 06/21/2014.   Specialty:  Rheumatology   Why:  1 :30 pm    Contact information:   [address] [place] [address] [phone]       Follow up with ROBERTSON, ANTHONY T, PA-C. Schedule an appointment as soon as possible for a visit in 1 week.   Specialty:  Physician Assistant   Why:  post hospitalization.    Contact information:   [address] [place] [address] [phone]       Follow up with [place].   Specialty:  Cardiology   Why:  Recheck labwork on Monday 06/13/14 to make sure kidney function stable - may come anytime between 8-4pm.   Contact information:   [address], [address] [phone]      Follow up with Richardson Dopp, PA-C.   Specialty:  Physician Assistant   Why:  [facility] - 06/20/14 at 12:10pm   Contact information:   [address] [place] [address] [phone]        The results of significant diagnostics from this hospitalization (including  imaging, microbiology, ancillary and laboratory) are listed below for reference.    Significant Diagnostic Studies: Dg Chest 1 View  06/01/2014   CLINICAL DATA:  Status post thoracentesis.  EXAM: CHEST  1 VIEW  COMPARISON:  Chest CT 05/31/2014  FINDINGS: The cardiac silhouette, mediastinal and hilar contours are Stable. There is tortuosity and calcification of the thoracic aorta. Interval decrease and right-sided pleural effusion. No postprocedural pneumothorax is identified.  IMPRESSION: Decrease in right-sided pleural effusion. No postprocedural pneumothorax.   Electronically Signed   By: Marijo Sanes M.D.   On: 06/01/2014 12:19   Dg Chest 2 View  06/03/2014   CLINICAL DATA:  54 year old male with right pleural effusion status post thoracentesis yesterday.  Initial encounter.  EXAM: CHEST  2 VIEW  COMPARISON:  Chest CT 06/02/2014 and earlier.  FINDINGS: Small bilateral pleural effusions have not significantly changed since January. The right is larger. No pneumothorax identified. Stable cardiac size and mediastinal contours. Stable streaky perihilar and basilar pulmonary opacity. No interval areas of worsening ventilation. Bulky first rib costochondral calcification re - identified. Visualized tracheal air column is within normal limits. No acute osseous abnormality identified.  IMPRESSION: Small bilateral pleural effusions. No pneumothorax after thoracentesis. No significant change in ventilation.   Electronically Signed   By: Genevie Ann M.D.   On: 06/03/2014 07:52   Dg Chest 2 View  05/30/2014   CLINICAL DATA:  Left-sided chest pain and shortness of breath for 3 days. Multiple prior left thoracentesis he has over the past 5 years.  EXAM: CHEST  2 VIEW  COMPARISON:  05/03/2014  FINDINGS: Mild cardiac enlargement with increased pulmonary vascularity. No edema or consolidation. Small bilateral pleural effusions with basilar atelectasis. Appearance is similar to prior study. No pneumothorax. Mediastinal  contours appear intact.  IMPRESSION: Bilateral pleural effusions with basilar atelectasis. Mild cardiac enlargement and pulmonary vascular congestion.   Electronically Signed   By: Lucienne Capers M.D.   On: 05/30/2014 20:53   Ct Angio Chest Pe W/cm &/or Wo Cm  05/31/2014   CLINICAL DATA:  Chest pain and shortness of breath beginning mid January, worsening since Saturday.  EXAM: CT ANGIOGRAPHY CHEST WITH CONTRAST  TECHNIQUE: Multidetector CT imaging of the chest was performed using the standard protocol during bolus administration of intravenous contrast. Multiplanar CT image reconstructions and MIPs were obtained to evaluate the vascular anatomy.  CONTRAST:  125mL OMNIPAQUE IOHEXOL 350 MG/ML SOLN  COMPARISON:  Chest radiograph May 30, 2014 at 2010 hours and CT of the chest December 27, 2013  FINDINGS: Adequate pulmonary arterial contrast opacification. Main pulmonary artery is not enlarged. No pulmonary embolism to the level of the subsegmental branches though mild respiratory motion degrades sensitivity.  Heart size is mildly enlarged, no pericardial fluid collections. Thoracic aorta is normal in course and caliber with mild calcific atherosclerosis. RIGHT hilar lymphadenopathy measures up to 12 mm short axis. Pre treat tracheal 13 mm short axis lymph node, additional smaller pre VATS vascular and aortopulmonary window lymph nodes.  Moderate RIGHT pleural effusion, small LEFT pleural effusion and thickening. Pulmonary vascular congestion. Patchy ground-glass opacities and interstitial prominence in LEFT upper lobe, axial 39/90. Bilateral lower lobe atelectasis. Patchy enhancing LEFT lower lobe airspace opacity. Tracheobronchial tree is patent and midline. No pneumothorax.  Multiple subcentimeter gallstones without CT findings of acute cholecystitis. 2.5 cm cyst in RIGHT lobe of the liver, liver is otherwise unremarkable.  Review of the MIP images confirms the above findings.  IMPRESSION: No acute  pulmonary embolism on this mild respiratory motion degraded examination.  Mild cardiomegaly and pulmonary vascular congestion. Moderate RIGHT pleural effusion. Patchy LEFT upper lobe airspace opacity could reflect confluent edema though, pneumonia is a concern. Patchy enhancing atelectasis/ pneumonia LEFT lower lobe. Recommend followup imaging after treatment to verify improvement.  Mediastinal lymphadenopathy may be reactive though, recommend close attention on follow-up imaging.   Electronically Signed   By: Elon Alas   On: 05/31/2014 01:03   Ct Coronary Morp W/cta Cor W/score W/ca W/cm &/or Wo/cm  06/02/2014   ADDENDUM REPORT: 06/02/2014 20:52  CLINICAL DATA:  Chest pain  EXAM: Cardiac/Coronary  CT  TECHNIQUE: The patient was scanned on a Philips 256 scanner.  FINDINGS: A 120 kV prospective  scan was triggered in the descending thoracic aorta at 111 HU's. Axial non-contrast 3 mm slices were carried out through the heart. The data set was analyzed on a dedicated work station and scored using the [place]. Gantry rotation speed was 270 msecs and collimation was .9 mm. No beta blockade and 0.4 mg of sl NTG was given. The 3D data set was reconstructed in 5% intervals of the 67-82 % of the R-R cycle. Diastolic phases were analyzed on a dedicated work station using MPR, MIP and VRT modes. The patient received 80 cc of contrast.  Aorta:  Aortic Valve:  Coronary Arteries: Originating in normal position. Right dominance.  RCA is a very large dominant vessel that gives rise to PDA and PLA arteries. There is mild calcified plaque in the proximal portion associated with 25-50% stenosis. Proximal to mid portion is affected by motion and accurate stenosis is not possible. Mid and distal RCA has no significant stenosis.  Left main is a large vessel that has mild mixed, predominantly calcified plaque in its proximal portion associated with 25-50% stenosis.  LAD is a large vessel that has mild calcified plaque in  its proximal segment associated with 25-50% stenosis and minimal plaque in the mid segment with 0-25% stenosis. LAD gives rise to two diagonal branches with no significant stenosis.  LCX artery is a medium size non-dominant vessel that gives rise to one obtuse marginal branch. There is only minimal calcified plaque in mid LCX artery associated with 0-25% stenosis.  IMPRESSION: 1. Coronary calcium score of 236. This was 56 percentile for age and sex matched control.  2.  Normal coronary origin.  Right dominance.  3. 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 Significant motion artifact in the proximal to mid RCA affecting ability read, however there is suspicion for a significant stenosis.  Ena Dawley   Electronically Signed   By: Ena Dawley   On: 06/02/2014 20:52   06/02/2014   EXAM: OVER-READ INTERPRETATION  CT CHEST  The following report is an over-read performed by radiologist Dr. Forest Gleason [facility] on 06/02/2014. This over-read does not include interpretation of cardiac or coronary anatomy or pathology. The coronary calcium score/coronary CTA interpretation by the cardiologist is attached.  COMPARISON:  Chest radiograph above 06/02/2014. Chest CT of the 2 days prior.  FINDINGS: Mediastinum/Nodes: Mediastinal adenopathy, as detailed on recent diagnostic CT. A right paratracheal node measures 1.3 cm on image 10 of series 504.  Subcarinal node measures 1.3 cm on image 24.  1.6 cm right hilar node is mildly enlarged.  Aortic atherosclerosis, without aneurysm.  Lungs/Pleura: Decrease in size of a small right pleural effusion. Trace left pleural fluid or thickening. Circumferential right-sided pleural thickening is also identified and is mild.  Subpleural left upper lobe ground-glass opacity is improved. Scattered areas of subsegmental atelectasis in both lungs.  Upper abdomen: Hepatic steatosis. Central left hepatic lobe cyst. Cholelithiasis.  Musculoskeletal: No acute osseous  abnormality.  IMPRESSION: 1. Thoracic adenopathy, as detailed on 05/31/2014 CT. This could be reactive or related to congestive heart failure. Consider followup chest CT at 3 months to confirm stability. 2. Improved left upper lobe ground-glass opacity, possibly related to pulmonary edema. 3. Decrease in small right pleural effusion with bilateral pleural thickening.  Electronically Signed: By: Abigail Miyamoto M.D. On: 06/02/2014 14:52   Dg Chest Port 1 View  06/04/2014   CLINICAL DATA:  Pleural effusion  EXAM: PORTABLE CHEST - 1 VIEW  COMPARISON:  06/03/2014  FINDINGS: Cardiac shadow is stable. Bilateral pleural effusions are again seen and stable. No new focal infiltrate is noted. No bony abnormality is seen.  IMPRESSION: Stable small effusions bilaterally.   Electronically Signed   By: Inez Catalina M.D.   On: 06/04/2014 08:58   Dg Chest Port 1 View  06/02/2014   CLINICAL DATA:  Pleural effusion.  EXAM: PORTABLE CHEST - 1 VIEW  COMPARISON:  June 01, 2014.  FINDINGS: Stable cardiomediastinal silhouette. No pneumothorax is noted. Bony thorax is intact. Stable mild right pleural effusion is noted. No other significant abnormality is noted.  IMPRESSION: Stable mild right pleural effusion.   Electronically Signed   By: Marijo Conception, M.D.   On: 06/02/2014 09:36   US Thoracentesis Asp Pleural Space W/img Guide  06/01/2014   INDICATION: Symptomatic right sided pleural effusion  EXAM: US THORACENTESIS ASP PLEURAL SPACE W/IMG GUIDE  COMPARISON:  Previous thoracentesis  MEDICATIONS: 10 cc 1% lidocaine  COMPLICATIONS: None immediate  TECHNIQUE: Informed written consent was obtained from the patient after a discussion of the risks, benefits and alternatives to treatment. A timeout was performed prior to the initiation of the procedure.  Initial ultrasound scanning demonstrates a right pleural effusion. The lower chest was prepped and draped in the usual sterile fashion. 1% lidocaine was used for local anesthesia.   Under direct ultrasound guidance, a 19 gauge, 7-cm, Yueh catheter was introduced. An ultrasound image was saved for documentation purposes. the thoracentesis was performed. The catheter was removed and a dressing was applied. The patient tolerated the procedure well without immediate post procedural complication. The patient was escorted to have an upright chest radiograph.  FINDINGS: A total of approximately 420 cc of blood tinged fluid was removed. Requested samples were sent to the laboratory.  IMPRESSION: Successful ultrasound-guided R sided thoracentesis yielding 420 cc of pleural fluid.  Read by:  Lavonia Drafts [facility]   Electronically Signed   By: Jerilynn Mages.  Shick M.D.   On: 06/01/2014 14:42    Microbiology: Recent Results (from the past 240 hour(s))  Body fluid culture with gram stain     Status: None   Collection Time: 06/01/14 10:59 AM  Result Value Ref Range Status   Specimen Description FLUID PLEURAL RIGHT  Final   Special Requests NONE  Final   Gram Stain   Final    FEW WBC PRESENT,BOTH PMN AND MONONUCLEAR NO ORGANISMS SEEN Performed at [facility]    Culture   Final    NO GROWTH 3 DAYS Performed at [facility]    Report Status 06/04/2014 FINAL  Final  Culture, sputum-assessment     Status: None   Collection Time: 06/01/14  8:48 PM  Result Value Ref Range Status   Specimen Description SPUTUM  Final   Special Requests NONE  Final   Sputum evaluation   Final    THIS SPECIMEN IS ACCEPTABLE. RESPIRATORY CULTURE REPORT TO FOLLOW.   Report Status 06/01/2014 FINAL  Final  Culture, respiratory (NON-Expectorated)     Status: None   Collection Time: 06/01/14  8:48 PM  Result Value Ref Range Status   Specimen Description SPUTUM  Final   Special Requests NONE  Final   Gram Stain   Final    MODERATE WBC PRESENT,BOTH PMN AND MONONUCLEAR RARE SQUAMOUS EPITHELIAL CELLS PRESENT FEW GRAM POSITIVE COCCI IN PAIRS IN CLUSTERS Performed at [facility]    Culture    Final    NORMAL OROPHARYNGEAL FLORA Performed at [facility]    Report Status 06/04/2014 FINAL  Final  Surgical pcr screen     Status: None   Collection Time: 06/05/14  9:59 PM  Result Value Ref Range Status   MRSA, PCR NEGATIVE NEGATIVE Final   Staphylococcus aureus NEGATIVE NEGATIVE Final    Comment:        The Xpert SA Assay (FDA approved for NASAL specimens in patients over 42 years of age), is one component of a comprehensive surveillance program.  Test performance has been validated by [facility] for patients greater than or equal to 63 year old. It is not intended to diagnose infection nor to guide or monitor treatment.      Labs: Basic Metabolic Panel:  Recent Labs Lab 06/06/14 0431 06/08/14 0445  NA 136 136  K 4.7 4.6  CL 100 101  CO2 28 28  GLUCOSE 241* 122*  BUN 17 14  CREATININE 0.93 0.92  CALCIUM 9.2 8.6   Liver Function Tests: No results for input(s): AST, ALT, ALKPHOS, BILITOT, PROT, ALBUMIN in the last 168 hours. No results for input(s): LIPASE, AMYLASE in the last 168 hours. No results for input(s): AMMONIA in the last 168 hours. CBC:  Recent Labs Lab 06/06/14 0431 06/07/14 0623 06/08/14 0445  WBC 25.7* 17.7* 16.1*  HGB 11.8* 11.4* 11.6*  HCT 36.1* 35.4* 35.9*  MCV 82.8 82.1 82.0  PLT 540* 462* 485*   Cardiac Enzymes:  Recent Labs Lab 06/08/14 0838 06/08/14 1500 06/08/14 2140  TROPONINI 0.13* 0.14* 0.14*   BNP: BNP (last 3 results)  Recent Labs  05/30/14 2150  BNP 92.5    ProBNP (last 3 results)  Recent Labs  10/06/13 1942  PROBNP 79.8    CBG: No results for input(s): GLUCAP in the last 168 hours.     SignedHosie Poisson  [facility] 06/09/2014, 12:20 PM

## 2014-06-09 NOTE — Progress Notes (Signed)
CARDIAC REHAB PHASE I   PRE:  Rate/Rhythm: 88 SR  BP:  Supine:   Sitting: 145/84  Standing:    SaO2:   MODE:  Ambulation: 1000 ft   POST:  Rate/Rhythm: 109 ST  BP:  Supine:   Sitting: 167/86  Standing:    SaO2:  0815-0830 Pt walked 1000 ft with steady gait. No CP. Tolerated well. No questions re ed completed yesterday. Anxious for discharge.   Graylon Good, RN BSN  06/09/2014 8:25 AM

## 2014-06-09 NOTE — Progress Notes (Signed)
Patient: Colin Lee / Admit Date: 05/30/2014 / Date of Encounter: 06/09/2014, 10:28 AM   Subjective: Feeling well. No CP or SOB. Eager to go home.   Objective: Telemetry: NSR, 1 PVC triplet overnight Physical Exam: Blood pressure 135/78, pulse 76, temperature 98.4 F (36.9 C), temperature source Oral, resp. rate 18, height 5\' 10"  (1.778 m), weight 272 lb 11.3 oz (123.7 kg), SpO2 96 %. General: Well developed, well nourished WM, in no acute distress. Head: Normocephalic, atraumatic, sclera non-icteric, no xanthomas, nares are without discharge. Neck: Negative for carotid bruits. JVP not elevated. Lungs: Clear bilaterally to auscultation without wheezes, rales, or rhonchi. Breathing is unlabored. Heart: RRR S1 S2 without murmurs, rubs, or gallops.  Abdomen: Soft, non-tender, non-distended with normoactive bowel sounds. No rebound/guarding. Extremities: No clubbing or cyanosis. No edema. Distal pedal pulses are 2+ and equal bilaterally. Right radial cath site without ecchymosis or hematoma, good pulse Neuro: Alert and oriented X 3. Moves all extremities spontaneously. Psych:  Responds to questions appropriately with a normal affect.   Intake/Output Summary (Last 24 hours) at 06/09/14 1028 Last data filed at 06/09/14 0700  Gross per 24 hour  Intake 2449.13 ml  Output   1600 ml  Net 849.13 ml    Inpatient Medications:  . aspirin  81 mg Oral Daily  . atorvastatin  40 mg Oral q1800  . benzonatate  100 mg Oral TID  . losartan  50 mg Oral Daily  . metoprolol tartrate  12.5 mg Oral BID  . pantoprazole  40 mg Oral Daily  . polyethylene glycol  17 g Oral Daily  . predniSONE  20 mg Oral Q breakfast  . rOPINIRole  0.25 mg Oral QHS  . sodium chloride  3 mL Intravenous Q12H  . sodium chloride  3 mL Intravenous Q12H  . ticagrelor  90 mg Oral BID   Infusions:    Labs:  Recent Labs  06/08/14 0445  NA 136  K 4.6  CL 101  CO2 28  GLUCOSE 122*  BUN 14  CREATININE 0.92    CALCIUM 8.6   No results for input(s): AST, ALT, ALKPHOS, BILITOT, PROT, ALBUMIN in the last 72 hours.  Recent Labs  06/07/14 0623 06/08/14 0445  WBC 17.7* 16.1*  HGB 11.4* 11.6*  HCT 35.4* 35.9*  MCV 82.1 82.0  PLT 462* 485*    Recent Labs  06/08/14 0838 06/08/14 1500 06/08/14 2140  TROPONINI 0.13* 0.14* 0.14*   Invalid input(s): POCBNP No results for input(s): HGBA1C in the last 72 hours.   Radiology/Studies:  See cath report    Assessment and Plan  54 y/o M with history of coronary calcification, GERD, HTN, possible autoimmune disease with history of pleurodesis for recurrent left-sided effusion presented with [place] and CAP, s/p DES to RCA.  1. Unstable angina/CAD s/p DES to prox RCA on 06/07/14, normal LV function - mildly elevated trops after first cath, relook cath yesterday showed patent stent and patent cors otherwise - continue DAPT x 1 year - have asked him to consider alternative of Tylenol rather than the ibuprofen on his home med rec given CV risk of NSAIDs - cath instructions posted on discharge instructions - from cardiac standpoint OK to return to work in 1 week if feeling well - continue BB/statin and consider f/u LFTs/lipids as outpatient in 6 weeks - suspect Dr. Stanford Breed will agree with plan for DC  2 HTN - BPs variable - would continue current regimen and titrate as outpatient if needed  3. 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  Signed, Melina Copa PA-C As above; patient seen and examined; no chest pain; patient can be DCed from a cardiac standpoint and FU with Dr Marlou Porch; would DC on present cardiac meds. Continue evaluation of pleural effusions per primary care. Kirk Ruths

## 2014-06-10 ENCOUNTER — Telehealth: Payer: Self-pay | Admitting: *Deleted

## 2014-06-10 MED ORDER — NITROGLYCERIN 0.4 MG SL SUBL: 0.4000 mg | SUBLINGUAL_TABLET | SUBLINGUAL | Status: DC | PRN

## 2014-06-10 NOTE — Telephone Encounter (Signed)
Patient stated that an rx for nitro was supposed to be sent in yesterday upon d/c from the hospital. I do not see any documentation of this nor is it on his med list. He would like this sent to [facility] in [place] and would also like a phone call when this has been sent. Please advise. Thanks, MI

## 2014-06-10 NOTE — Telephone Encounter (Signed)
Is this OK to fill?

## 2014-06-10 NOTE — Addendum Note (Signed)
Addended by: Emily Filbert on: 06/10/2014 06:33 PM   Modules accepted: Orders

## 2014-06-10 NOTE — Telephone Encounter (Signed)
Patient contacted regarding discharge from [facility] on 06/09/14.  Patient understands to follow up with Richardson Dopp, PA on 06/20/14 at 12:10 pm at [facility]. Patient understands discharge instructions? Yes Patient understands medications and regiment? Yes Patient understands to bring all medications to this visit? Yes

## 2014-06-12 NOTE — Telephone Encounter (Signed)
That is fine 

## 2014-06-13 ENCOUNTER — Other Ambulatory Visit (INDEPENDENT_AMBULATORY_CARE_PROVIDER_SITE_OTHER): Payer: BC Managed Care – PPO | Admitting: *Deleted

## 2014-06-13 DIAGNOSIS — I2510 Atherosclerotic heart disease of native coronary artery without angina pectoris: Secondary | ICD-10-CM

## 2014-06-13 DIAGNOSIS — Z9861 Coronary angioplasty status: Secondary | ICD-10-CM

## 2014-06-13 LAB — BASIC METABOLIC PANEL
BUN: 23 mg/dL (ref 6–23)
CO2: 27 mEq/L (ref 19–32)
Calcium: 9.4 mg/dL (ref 8.4–10.5)
Chloride: 100 mEq/L (ref 96–112)
Creatinine, Ser: 1.1 mg/dL (ref 0.40–1.50)
GFR: 74.79 mL/min (ref >60.00)
Glucose, Bld: 116 mg/dL — ABNORMAL HIGH (ref 70–99)
Potassium: 4.5 mEq/L (ref 3.5–5.1)
Sodium: 133 mEq/L — ABNORMAL LOW (ref 135–145)

## 2014-06-13 NOTE — Telephone Encounter (Signed)
Done. See 3/3 phone note.

## 2014-06-13 NOTE — Addendum Note (Signed)
Addended by: Eulis Foster on: 06/13/2014 12:47 PM   Modules accepted: Orders

## 2014-06-16 ENCOUNTER — Telehealth: Payer: Self-pay | Admitting: Cardiology

## 2014-06-16 NOTE — Telephone Encounter (Signed)
New Msg      Request for surgical clearance:  1. What type of surgery is being performed?oral surgery, tooth extraction  2. When is this surgery scheduled? Not scheduled   3. Are there any medications that need to be held prior to surgery and how long? B[place]inta, not sure of amount of days  Name of physician performing surgery? [facility] Dr. Willa Rough   4. What is your office phone and fax number? [phone], Fax [phone]   Pt calling, please contact office with information.

## 2014-06-20 ENCOUNTER — Ambulatory Visit (INDEPENDENT_AMBULATORY_CARE_PROVIDER_SITE_OTHER): Payer: BC Managed Care – PPO | Admitting: Pharmacist

## 2014-06-20 ENCOUNTER — Ambulatory Visit (INDEPENDENT_AMBULATORY_CARE_PROVIDER_SITE_OTHER): Payer: BC Managed Care – PPO | Admitting: Physician Assistant

## 2014-06-20 ENCOUNTER — Encounter: Payer: Self-pay | Admitting: Physician Assistant

## 2014-06-20 VITALS — BP 112/78 | HR 61 | Ht 70.0 in | Wt 267.0 lb

## 2014-06-20 DIAGNOSIS — E785 Hyperlipidemia, unspecified: Secondary | ICD-10-CM

## 2014-06-20 DIAGNOSIS — I2510 Atherosclerotic heart disease of native coronary artery without angina pectoris: Secondary | ICD-10-CM

## 2014-06-20 DIAGNOSIS — I10 Essential (primary) hypertension: Secondary | ICD-10-CM

## 2014-06-20 DIAGNOSIS — J189 Pneumonia, unspecified organism: Secondary | ICD-10-CM

## 2014-06-20 DIAGNOSIS — K047 Periapical abscess without sinus: Secondary | ICD-10-CM

## 2014-06-20 DIAGNOSIS — I200 Unstable angina: Secondary | ICD-10-CM

## 2014-06-20 DIAGNOSIS — J90 Pleural effusion, not elsewhere classified: Secondary | ICD-10-CM

## 2014-06-20 HISTORY — DX: Atherosclerotic heart disease of native coronary artery without angina pectoris: I25.10

## 2014-06-20 NOTE — Progress Notes (Signed)
Pharmacy Transitions of Care Post-ACS Visit  Admit Complaint: Colin Lee is a 54 yo male who was discharged on 06/09/14 with unstable anginal and DES. PMH is significant for HTN, UA, RLS, arthralgias, and CAD. Patient presents to clinic for pharmacy medication reconciliation.  Medications and allergies were reviewed with patient. All medications were brought to today's appointment.  Current Outpatient Prescriptions  Medication Sig Dispense Refill  . albuterol (PROVENTIL HFA;VENTOLIN HFA) 108 (90 BASE) MCG/ACT inhaler Inhale 2 puffs into the lungs every 6 (six) hours as needed for wheezing or shortness of breath.    Marland Kitchen aspirin EC 81 MG tablet Take 81 mg by mouth daily.    Marland Kitchen atorvastatin (LIPITOR) 40 MG tablet Take 1 tablet (40 mg total) by mouth daily at 6 PM. 30 tablet 0  . losartan (COZAAR) 50 MG tablet Take 50 mg by mouth daily.    . metoprolol tartrate (LOPRESSOR) 25 MG tablet Take 0.5 tablets (12.5 mg total) by mouth 2 (two) times daily. 60 tablet 0  . Multiple Vitamins-Minerals (CENTRUM ADULTS PO) Take 1 tablet by mouth daily.    . nitroGLYCERIN (NITROSTAT) 0.4 MG SL tablet Place 1 tablet (0.4 mg total) under the tongue every 5 (five) minutes as needed for chest pain. 25 tablet 3  . omeprazole (PRILOSEC) 20 MG capsule Take 20 mg by mouth daily.    . polyethylene glycol (MIRALAX / GLYCOLAX) packet Take 17 g by mouth daily. (Patient taking differently: Take 17 g by mouth daily as needed for mild constipation or moderate constipation. ) 30 each 0  . predniSONE (DELTASONE) 20 MG tablet Take 1 tablet (20 mg total) by mouth daily with breakfast. 15 tablet 0  . ticagrelor (BRILINTA) 90 MG TABS tablet Take 1 tablet (90 mg total) by mouth 2 (two) times daily. 60 tablet 1  . rOPINIRole (REQUIP) 0.25 MG tablet Take 1 tablet (0.25 mg total) by mouth at bedtime. (Patient not taking: Reported on 06/20/2014) 30 tablet 0   No current facility-administered medications for this visit.    ACS Medication  Checklist:     Medication      YES      NO N/A or C/I and Explanation  Beta Blocker [x]  []    ACEi or ARB [x]  []     High-dose statin    (atorvastatin 40 or 80mg , rosuvastatin 20 or 40mg ) [x]  []    Clopidogrel +/- other         anti-platelets [x]         []     Aspirin [x]  []    Nitroglycerin []  [x]  Patient was not discharged from the hospital on NTG. However, he called the office asking about this since it was mentioned to him in the hospital. Prescription was since sent over and patient has picked it up.    Smoking cessation counseling provided []  [x]  Quit smoking in 2001. Had smoked 2PPD for 30 years.  Cardiac rehab referral [x]         []      Assessment/Plan: refer to chart below.   Interventions during today's visit include: Intervention     YES      NO  Explanation   Indications      Needs Therapy []  [x]     Unnecessary Therapy [x]  []  Patient taking Prilosec, GERD not listed in problem list. Patient states that he has had GERD in the past but since he has changed his diet and cut out salt, he has been asymptomatic. Is willing to try a trial off his  PPI.  Efficacy     Suboptimal Drug Selection []  [x]    Insufficient Dose/Duration []  [x]     Safety      Adverse Drug Event []  [x]     Drug Interaction []  [x]     Excessive Dose/Duration []  [x]    Compliance     Underuse []  [x]     Overuse []  [x]     Patient reports compliance with all medications. He had a lot of questions about his new cardiac medications which we discussed in length. There was some confusion regarding indication of therapy for his Brillinta vs. Lipitor. Discussed administration instructions for nitroglycerin as well.   Will follow up with patient by phone in 1 month to discuss adherence and any medication-related concerns.  Colin Lee, Pharm.D Clinical Pharmacy Resident Pager: [phone] 06/20/2014 12:27 PM

## 2014-06-20 NOTE — Telephone Encounter (Signed)
He just had PCI with DES so cannot be off Brilinta.  They will need to do it on DAPT.  Would prefer to wait for 4 weeks after his PCI unless it is an emergency.  He needs SBE prophylaxis if it has to be done in the first 4 weeks after PCI

## 2014-06-20 NOTE — Patient Instructions (Signed)
Schedule labs in 6 weeks:  Lipid Panel, Hepatic Function Panel.  Schedule follow up with Dr. Fransico Him in 6-8 weeks.

## 2014-06-20 NOTE — Progress Notes (Signed)
Cardiology Office Note   Date:  06/20/2014   ID:  Colin Lee, DOB 1960/07/20, MRN [id number]  PCP:  Bronson Curb, PA-C  Cardiologist:  Dr. Fransico Him     Chief Complaint  Patient presents with  . Coronary Artery Disease    s/p recent PCI with DES to RCA  . Hospitalization Follow-up     History of Present Illness: Colin Lee is a 54 y.o. male with a hx of recurrent left pleural effusion and has had drainage and talc pleurodesis with Dr. [name].  He saw Dr. Radford Pax last month for exertional chest pain.  Stress test was done and neg for ischemia.  Echo demonstrated normal LVF.  He continued to have CP and was scheduled for cardiac CTA.  He was then admitted 2/23-3/3 with unstable angina.  Cardiac CTA did demonstrate probable significant CAD. He was referred for cardiac cath.  LHC demonstrated high grade proximal RCA stenosis that was treated with DES x 2.  He had recurrent CP post PCI.  Re-look cath demonstrated patent stents in the RCA.  During this admission he was found to have moderate pleural effusion and community acquired pneumonia. He underwent thoracocentesis and was found to have bloody effusion with fluid analysis showing lymphocytes. ID and pulmonary consulted and suggested this could be rheumatologic in origin and rheumatology work up ordered. Meanwhile his case was discussed with Dr Charlestine Night and recommended outpatient follow up.  He returns for FU.  He is feeling good.  Denies further chest pain.  No significant dyspnea.  No orthopnea, PND, edema.  No syncope.  He needs a tooth pulled.  He tells me the dentist can pull it while he is on ASA and Brilinta.    Studies/Reports Reviewed Today:  Cardiac cath 06/08/14 LM:  widely patent LAD:  widely patent   LCx:  widely patent RCA:  widely patent without evidence of thrombus, dissection, or other abnormality..  Cardiac cath/PCI 06/07/14 Left mainstem: 30% ostial stenosis Left anterior descending (LAD):  Normal.  Left circumflex (LCx): Normal Right coronary artery (RCA): There is a complex 90% stenosis in the proximal vessel with ulceration. EF:   55-65%, there is no significant mitral regurgitation  PCI Data: RCA/Segment - proximal 4.0 x 20 and 4.0 x 12 mm Promus stents  Echo 05/26/14 - EF 55% to 60%. Wall motion was normal; Grade 1 diastolic dysfunction  - Left atrium: The atrium was mildly dilated. - Pulmonary arteries: Systolic pressure was mildly increased.  Myoview 05/27/14 Low risk stress  Small in size, mild in intensity, fixed defect consistent with diaphragmatic attenuation.   There is no evidence of ischemia.  Marland Kitchen LVEF: 54%.      Past Medical History  Diagnosis Date  . GERD (gastroesophageal reflux disease)   . Hypertension   . Shortness of breath     Starting May 2015  . Headache     stress and tension  . Neuromuscular disorder     restless legs and leg cramps  . Arthritis   . Asthma     Past Surgical History  Procedure Laterality Date  . No prior surgery    . Thoracentesis Left 2015  . Colonoscopy w/ biopsies and polypectomy  2014    benign  . Video assisted thoracoscopy Left 02/01/2014    Procedure: VIDEO ASSISTED THORACOSCOPY;  Surgeon: Grace Isaac, MD;  Location: [place];  Service: Thoracic;  Laterality: Left;  . Pleural effusion drainage Left 02/01/2014    Procedure:  DRAINAGE OF PLEURAL EFFUSION;  Surgeon: Grace Isaac, MD;  Location: [place];  Service: Thoracic;  Laterality: Left;  . Video bronchoscopy N/A 02/01/2014    Procedure: VIDEO BRONCHOSCOPY;  Surgeon: Grace Isaac, MD;  Location: [place];  Service: Thoracic;  Laterality: N/A;  . Pleural biopsy Left 02/01/2014    Procedure: PLEURAL BIOPSY;  Surgeon: Grace Isaac, MD;  Location: [place];  Service: Thoracic;  Laterality: Left;  . Chest tube insertion Left 02/01/2014    Procedure: INSERTION PLEURAL DRAINAGE CATHETER;  Surgeon: Grace Isaac, MD;  Location: [place];  Service: Thoracic;   Laterality: Left;  . Talc pleurodesis Left 02/01/2014    Procedure: TALC PLEURADESIS;  Surgeon: Grace Isaac, MD;  Location: [place];  Service: Thoracic;  Laterality: Left;  . Removal of pleural drainage catheter Left 02/16/2014    Procedure: REMOVAL OF PLEURAL DRAINAGE CATHETER;  Surgeon: Grace Isaac, MD;  Location: [place];  Service: Thoracic;  Laterality: Left;  . Left heart catheterization with coronary angiogram N/A 06/07/2014    Procedure: LEFT HEART CATHETERIZATION WITH CORONARY ANGIOGRAM;  Surgeon: Peter M Martinique, MD;  Location: [facility] C[facility] LAB;  Service: Cardiovascular;  Laterality: N/A;  . Left heart catheterization with coronary angiogram N/A 06/08/2014    Procedure: LEFT HEART CATHETERIZATION WITH CORONARY ANGIOGRAM;  Surgeon: Sinclair Grooms, MD;  Location[facility] C[facility] LAB;  Service: Cardiovascular;  Laterality: N/A;     Current Outpatient Prescriptions  Medication Sig Dispense Refill  . albuterol (PROVENTIL HFA;VENTOLIN HFA) 108 (90 BASE) MCG/ACT inhaler Inhale 2 puffs into the lungs every 6 (six) hours as needed for wheezing or shortness of breath.    Marland Kitchen amoxicillin (AMOXIL) 500 MG capsule Take 500 mg by mouth every 6 (six) hours. FOR DENTAL    . aspirin EC 81 MG tablet Take 81 mg by mouth daily.    Marland Kitchen atorvastatin (LIPITOR) 40 MG tablet Take 1 tablet (40 mg total) by mouth daily at 6 PM. 30 tablet 0  . losartan (COZAAR) 50 MG tablet Take 50 mg by mouth daily.    . metoprolol tartrate (LOPRESSOR) 25 MG tablet Take 0.5 tablets (12.5 mg total) by mouth 2 (two) times daily. 60 tablet 0  . Multiple Vitamins-Minerals (CENTRUM ADULTS PO) Take 1 tablet by mouth daily.    . nitroGLYCERIN (NITROSTAT) 0.4 MG SL tablet Place 1 tablet (0.4 mg total) under the tongue every 5 (five) minutes as needed for chest pain. 25 tablet 3  . omeprazole (PRILOSEC) 20 MG capsule Take 20 mg by mouth daily.    . polyethylene glycol (MIRALAX / GLYCOLAX) packet Take 17 g by mouth daily. (Patient taking  differently: Take 17 g by mouth daily as needed for mild constipation or moderate constipation. ) 30 each 0  . predniSONE (DELTASONE) 20 MG tablet Take 1 tablet (20 mg total) by mouth daily with breakfast. 15 tablet 0  . rOPINIRole (REQUIP) 0.25 MG tablet Take 1 tablet (0.25 mg total) by mouth at bedtime. 30 tablet 0  . ticagrelor (BRILINTA) 90 MG TABS tablet Take 1 tablet (90 mg total) by mouth 2 (two) times daily. 60 tablet 1   No current facility-administered medications for this visit.    Allergies:   Review of patient's allergies indicates no known allergies.    Social History:  The patient  reports that he quit smoking about 14 years ago. His smoking use included Cigarettes. He has a 30 pack-year smoking history. He has never used smokeless  tobacco. He reports that he drinks alcohol. He reports that he does not use illicit drugs.   Family History:  The patient's He was adopted. Family history is unknown by patient.    ROS:   Please see the history of present illness.   Review of Systems  HENT: Positive for headaches.   Hematologic/Lymphatic: Bruises/bleeds easily.  All other systems reviewed and are negative.     PHYSICAL EXAM: VS:  BP 112/78 mmHg  Pulse 61  Ht 5\' 10"  (1.778 m)  Wt 267 lb (121.11 kg)  BMI 38.31 kg/m2    Wt Readings from Last 3 Encounters:  06/20/14 267 lb (121.11 kg)  06/09/14 272 lb 11.3 oz (123.7 kg)  05/19/14 277 lb (125.646 kg)     GEN: Well nourished, well developed, in no acute distress HEENT: normal Neck: no JVD, no masses Cardiac:  Normal S1/S2, RRR; no murmur ,  no rubs or gallops, no edema; right wrist without hematoma or mass  Respiratory:  clear to auscultation bilaterally, no wheezing, rhonchi or rales. GI: soft, nontender, nondistended, + BS MS: no deformity or atrophy Skin: warm and dry  Neuro:  CNs II-XII intact, Strength and sensation are intact Psych: Normal affect   EKG:  EKG is ordered today.  It demonstrates:   NSR, HR 61,  no ST changes.   Recent Labs: 10/06/2013: Pro B Natriuretic peptide (BNP) 79.8 12/27/2013: TSH 1.410 05/30/2014: B Natriuretic Peptide 92.5 05/31/2014: ALT 37 06/08/2014: Hemoglobin 11.6*; Platelets 485* 06/13/2014: BUN 23; Creatinine 1.10; Potassium 4.5; Sodium 133*    Lipid Panel    Component Value Date/Time   CHOL 145 12/29/2013 1034   TRIG 129 12/29/2013 1034   HDL 25* 12/29/2013 1034   CHOLHDL 5.8 12/29/2013 1034   VLDL 26 12/29/2013 1034   LDLCALC 94 12/29/2013 1034      ASSESSMENT AND PLAN:  Coronary artery disease Doing well after recent PCI with DES to RCA.  We discussed the importance of dual antiplatelet therapy. He cannot enroll in Cardiac Rehab b/c of his work schedule.  He is walking about an hour a day now.  I have encouraged him to continue to remain active.  Continue ASA, Brilinta, beta-blocker, angiotensin receptor blocker, statin.  Essential hypertension Controlled.  Dyslipidemia - Plan: Hepatic function panel, Lipid panel Continue statin.  Arrange Lipids and LFTs in 6 weeks.  Recurrent Pleural effusion S/p recent thoracentesis.  He has FU with Rheumatology tomorrow.  CAP (community acquired pneumonia)  Resolved.  He has completed antibiotics.  Dental Abscess I will review with Dr. Fransico Him the timing of his dental extraction.   Current medicines are reviewed at length with the patient today.  The patient does not have concerns regarding medicines.  The following changes have been made:  As above.  Labs/ tests ordered today include:   Orders Placed This Encounter  Procedures  . Hepatic function panel  . Lipid panel  . EKG 12-Lead    Disposition:   FU with Dr. Fransico Him 8 weeks.    Signed, Versie Starks, MHS 06/20/2014 12:59 PM    [facility] [address], [place], [place]  [address] Phone: [phone]; Fax: [phone]

## 2014-06-21 ENCOUNTER — Other Ambulatory Visit: Payer: Self-pay | Admitting: Rheumatology

## 2014-06-21 ENCOUNTER — Ambulatory Visit
Admission: RE | Admit: 2014-06-21 | Discharge: 2014-06-21 | Disposition: A | Discharge status: Home / Self Care | Payer: BC Managed Care – PPO | Source: Ambulatory Visit | Attending: Rheumatology | Admitting: Rheumatology

## 2014-06-21 DIAGNOSIS — J90 Pleural effusion, not elsewhere classified: Secondary | ICD-10-CM

## 2014-06-21 NOTE — Telephone Encounter (Signed)
Informed Dr. Leland Her representative that patient cannot be off Brilinta, that they will have to do it on DAPT. Informed him that Dr. Radford Pax would prefer to wait for 4 weeks after PCI unless tooth extraction is an emergency, and if it is less than 4 weeks after PCI he will need SBE prophylaxis.  Informed him that PCI was done 3/2.   He was thankful for update and said he'd call if anything else was needed.

## 2014-06-22 ENCOUNTER — Telehealth: Payer: Self-pay | Admitting: Cardiology

## 2014-06-22 NOTE — Telephone Encounter (Signed)
New message      Pt was discharged from [facility] 06-09-14.  His right chest side hurts when he breathes.  It hurt really bad when he takes a deep breathe.  He says it is not his heart----feels like it is his lungs.  He is not sob and denies any other symptoms. Please advise

## 2014-06-22 NOTE — Telephone Encounter (Signed)
Left message to call back  

## 2014-06-27 NOTE — Telephone Encounter (Signed)
Left message to call back if patient is still having pain or needs anything.

## 2014-07-12 ENCOUNTER — Telehealth: Payer: Self-pay | Admitting: Cardiology

## 2014-07-12 NOTE — Telephone Encounter (Signed)
I spoke with Judson Roch and gave her information from Dr Radford Pax about continuing aspirin/Brilinta.  Judson Roch states she will speak with doctor there and give them this information. Sarah informed that if doctor needs to speak with Dr Radford Pax she will be in [facility] tomorrow or  if necessary DOD available today.

## 2014-07-12 NOTE — Telephone Encounter (Signed)
Please call dentist to find out if procedure can be done on ASA/Brilinta since he just had a drug eluting stent placed and should not be off these for at least 1 year (6 months if urgent surgery needed)

## 2014-07-12 NOTE — Telephone Encounter (Signed)
I called [facility] and asked them to fax over a request of needs for surgical clearance// pt has an app today @ 4pm. The assistant was also informed that the pt can not come off [place] for 1 year, PCI 06/2014. Dr Radford Pax will be here 06/12/14.

## 2014-07-12 NOTE — Telephone Encounter (Signed)
Request for surgical clearance:  1. What type of surgery is being performed? Oral surgery  2. When is this surgery scheduled? today at 4pm  3. Are there any medications that need to be held prior to surgery and how long?Blood Thinner  4. Name of physician performing surgery? Dr Willa Rough  5. What is your office phone and fax number? Fax # [phone]     Need clearance in writing. Please advise

## 2014-08-02 ENCOUNTER — Ambulatory Visit (INDEPENDENT_AMBULATORY_CARE_PROVIDER_SITE_OTHER): Payer: BC Managed Care – PPO | Admitting: Cardiology

## 2014-08-02 ENCOUNTER — Encounter: Payer: Self-pay | Admitting: Cardiology

## 2014-08-02 ENCOUNTER — Other Ambulatory Visit (INDEPENDENT_AMBULATORY_CARE_PROVIDER_SITE_OTHER): Payer: BC Managed Care – PPO | Admitting: *Deleted

## 2014-08-02 VITALS — BP 124/76 | HR 84 | Ht 70.0 in | Wt 268.1 lb

## 2014-08-02 DIAGNOSIS — E785 Hyperlipidemia, unspecified: Secondary | ICD-10-CM | POA: Diagnosis not present

## 2014-08-02 DIAGNOSIS — I2510 Atherosclerotic heart disease of native coronary artery without angina pectoris: Secondary | ICD-10-CM

## 2014-08-02 DIAGNOSIS — I10 Essential (primary) hypertension: Secondary | ICD-10-CM

## 2014-08-02 DIAGNOSIS — I2583 Coronary atherosclerosis due to lipid rich plaque: Principal | ICD-10-CM

## 2014-08-02 LAB — LIPID PANEL
Cholesterol: 104 mg/dL (ref 0–200)
HDL: 29.2 mg/dL — ABNORMAL LOW (ref >39.00)
LDL Cholesterol: 50 mg/dL (ref 0–99)
NonHDL: 74.8
Total CHOL/HDL Ratio: 4
Triglycerides: 125 mg/dL (ref 0.0–149.0)
VLDL: 25 mg/dL (ref 0.0–40.0)

## 2014-08-02 LAB — HEPATIC FUNCTION PANEL
ALT: 26 U/L (ref 0–53)
AST: 19 U/L (ref 0–37)
Albumin: 4.1 g/dL (ref 3.5–5.2)
Alkaline Phosphatase: 82 U/L (ref 39–117)
Bilirubin, Direct: 0 mg/dL (ref 0.0–0.3)
Total Bilirubin: 0.3 mg/dL (ref 0.2–1.2)
Total Protein: 6.8 g/dL (ref 6.0–8.3)

## 2014-08-02 NOTE — Progress Notes (Signed)
Cardiology Office Note   Date:  08/02/2014   ID:  Colin Lee, DOB [birth year], MRN [id number]  PCP:  Bronson Curb, PA-C    Chief Complaint  Patient presents with  . Coronary Artery Disease  . Hypertension  . Hyperlipidemia      History of Present Illness: Colin Lee is a 54 y.o. male with a hx of recurrent left pleural effusion and has had drainage and talc pleurodesis with Dr. [name]. The last time I saw him he was complaining of exertional chest pain. Stress test was done and neg for ischemia. Echo demonstrated normal LVF. He continued to have CP and was scheduled for cardiac CTA. He was then admitted 2/23-3/3 with unstable angina. Cardiac CTA did demonstrate probable significant CAD. He was referred for cardiac cath. LHC demonstrated high grade proximal RCA stenosis that was treated with DES x 2. He had recurrent CP post PCI. Re-look cath demonstrated patent stents in the RCA. During this admission he was found to have moderate pleural effusion and community acquired pneumonia. He underwent thoracocentesis and was found to have bloody effusion with fluid analysis showing lymphocytes. ID and pulmonary consulted and suggested this could be rheumatologic in origin and rheumatology work up ordered.   He returns for FU. He is feeling good. Denies further chest pain. He has chronic SOB that has not changes any.   No  Edema, dizziness, palpitations or syncope.  He walks 30-45 minutes daily.     Past Medical History  Diagnosis Date  . GERD (gastroesophageal reflux disease)   . Hypertension   . Shortness of breath     Starting May 2015  . Headache     stress and tension  . Neuromuscular disorder     restless legs and leg cramps  . Arthritis   . Asthma   . Coronary artery disease 2016    90% RCA and 30% LM S/P PCI of RCA    Past Surgical History  Procedure Laterality Date  . No prior surgery    . Thoracentesis Left 2015  . Colonoscopy  w/ biopsies and polypectomy  2014    benign  . Video assisted thoracoscopy Left 02/01/2014    Procedure: VIDEO ASSISTED THORACOSCOPY;  Surgeon: Grace Isaac, MD;  Location: [place];  Service: Thoracic;  Laterality: Left;  . Pleural effusion drainage Left 02/01/2014    Procedure: DRAINAGE OF PLEURAL EFFUSION;  Surgeon: Grace Isaac, MD;  Location: [place];  Service: Thoracic;  Laterality: Left;  . Video bronchoscopy N/A 02/01/2014    Procedure: VIDEO BRONCHOSCOPY;  Surgeon: Grace Isaac, MD;  Location: [place];  Service: Thoracic;  Laterality: N/A;  . Pleural biopsy Left 02/01/2014    Procedure: PLEURAL BIOPSY;  Surgeon: Grace Isaac, MD;  Location: [place];  Service: Thoracic;  Laterality: Left;  . Chest tube insertion Left 02/01/2014    Procedure: INSERTION PLEURAL DRAINAGE CATHETER;  Surgeon: Grace Isaac, MD;  Location: [place];  Service: Thoracic;  Laterality: Left;  . Talc pleurodesis Left 02/01/2014    Procedure: TALC PLEURADESIS;  Surgeon: Grace Isaac, MD;  Location: [place];  Service: Thoracic;  Laterality: Left;  . Removal of pleural drainage catheter Left 02/16/2014    Procedure: REMOVAL OF PLEURAL DRAINAGE CATHETER;  Surgeon: Grace Isaac, MD;  Location: [place];  Service: Thoracic;  Laterality: Left;  . Left heart catheterization with coronary angiogram N/A 06/07/2014    Procedure: LEFT HEART CATHETERIZATION WITH CORONARY  Cyril Loosen;  Surgeon: Peter M Martinique, MD;  Location: [facility] CATH LAB;  Service: Cardiovascular;  Laterality: N/A;  . Left heart catheterization with coronary angiogram N/A 06/08/2014    Procedure: LEFT HEART CATHETERIZATION WITH CORONARY ANGIOGRAM;  Surgeon: Sinclair Grooms, MD;  Location: [facility] CATH LAB;  Service: Cardiovascular;  Laterality: N/A;     Current Outpatient Prescriptions  Medication Sig Dispense Refill  . albuterol (PROVENTIL HFA;VENTOLIN HFA) 108 (90 BASE) MCG/ACT inhaler Inhale 2 puffs into the lungs every 6 (six) hours as needed for  wheezing or shortness of breath.    Marland Kitchen aspirin EC 81 MG tablet Take 81 mg by mouth daily.    Marland Kitchen atorvastatin (LIPITOR) 40 MG tablet Take 1 tablet (40 mg total) by mouth daily at 6 PM. 30 tablet 0  . losartan (COZAAR) 50 MG tablet Take 50 mg by mouth daily.    . metoprolol tartrate (LOPRESSOR) 25 MG tablet Take 0.5 tablets (12.5 mg total) by mouth 2 (two) times daily. 60 tablet 0  . Multiple Vitamins-Minerals (CENTRUM ADULTS PO) Take 1 tablet by mouth daily.    . nitroGLYCERIN (NITROSTAT) 0.4 MG SL tablet Place 1 tablet (0.4 mg total) under the tongue every 5 (five) minutes as needed for chest pain. 25 tablet 3  . omeprazole (PRILOSEC) 20 MG capsule Take 20 mg by mouth daily.    . polyethylene glycol (MIRALAX / GLYCOLAX) packet Take 17 g by mouth daily. (Patient taking differently: Take 17 g by mouth daily as needed for mild constipation or moderate constipation. ) 30 each 0  . rOPINIRole (REQUIP) 0.25 MG tablet Take 1 tablet (0.25 mg total) by mouth at bedtime. 30 tablet 0  . ticagrelor (BRILINTA) 90 MG TABS tablet Take 1 tablet (90 mg total) by mouth 2 (two) times daily. 60 tablet 1   No current facility-administered medications for this visit.    Allergies:   Review of patient's allergies indicates no known allergies.    Social History:  The patient  reports that he quit smoking about 14 years ago. His smoking use included Cigarettes. He has a 30 pack-year smoking history. He has never used smokeless tobacco. He reports that he drinks alcohol. He reports that he does not use illicit drugs.   Family History:  The patient's He was adopted. Family history is unknown by patient.    ROS:  Please see the history of present illness.   Otherwise, review of systems are positive for none.   All other systems are reviewed and negative.    PHYSICAL EXAM: VS:  BP 124/76 mmHg  Pulse 84  Ht 5\' 10"  (1.778 m)  Wt 268 lb 1.9 oz (121.618 kg)  BMI 38.47 kg/m2  SpO2 96% , BMI Body mass index is 38.47  kg/(m^2). GEN: Well nourished, well developed, in no acute distress HEENT: normal Neck: no JVD, carotid bruits, or masses Cardiac: RRR; no murmurs, rubs, or gallops,no edema  Respiratory:  clear to auscultation bilaterally, normal work of breathing GI: soft, nontender, nondistended, + BS MS: no deformity or atrophy Skin: warm and dry, no rash Neuro:  Strength and sensation are intact Psych: euthymic mood, full affect   EKG:  EKG is not ordered today.    Recent Labs: 10/06/2013: Pro B Natriuretic peptide (BNP) 79.8 12/27/2013: TSH 1.410 05/30/2014: B Natriuretic Peptide 92.5 05/31/2014: ALT 37 06/08/2014: Hemoglobin 11.6*; Platelets 485* 06/13/2014: BUN 23; Creatinine 1.10; Potassium 4.5; Sodium 133*    Lipid Panel    Component Value Date/Time  CHOL 145 12/29/2013 1034   TRIG 129 12/29/2013 1034   HDL 25* 12/29/2013 1034   CHOLHDL 5.8 12/29/2013 1034   VLDL 26 12/29/2013 1034   LDLCALC 94 12/29/2013 1034      Wt Readings from Last 3 Encounters:  08/02/14 268 lb 1.9 oz (121.618 kg)  06/20/14 267 lb (121.11 kg)  06/09/14 272 lb 11.3 oz (123.7 kg)    ASSESSMENT AND PLAN:  Coronary artery disease Doing well after recent PCI with DES to RCA.He will continue dual antiplatelet therapy. He did not  enroll in Cardiac Rehab b/c of his work schedule. He is walking about an hour a day now. I have encouraged him to continue to remain active. Continue ASA, Brilinta, beta-blocker, angiotensin receptor blocker, statin.  Essential hypertension Controlled on cozaar/BB  Dyslipidemia - Plan: Hepatic function panel, Lipid panel Continue statin. Arrange FLP and ALT fasting    Recurrent Pleural effusion S/p recent thoracentesis. He has FU with Rheumatology tomorrow.   Current medicines are reviewed at length with the patient today.  The patient does not have concerns regarding medicines.  The following changes have been made:  no change  Labs/ tests ordered today include: see  above assessment and plan No orders of the defined types were placed in this encounter.     Disposition:   FU with me in 6 months   Signed, Sueanne Margarita, MD  08/02/2014 2:17 PM    [facility] [place], [place], [place]  [address] Phone: [phone]; Fax: [phone]

## 2014-08-02 NOTE — Patient Instructions (Signed)
Medication Instructions:  Your physician recommends that you continue on your current medications as directed. Please refer to the Current Medication list given to you today.   Labwork: Your physician recommends that you return for FASTING lab work on Thursday April 28. You may come ANY TIME bwetween  Testing/Procedures: None  Follow-Up: Your physician wants you to follow-up in: 6 months with Dr. Mallie Snooks will receive a reminder letter in the mail two months in advance. If you don't receive a letter, please call our office to schedule the follow-up appointment.   Any Other Special Instructions Will Be Listed Below (If Applicable).

## 2014-08-03 ENCOUNTER — Telehealth: Payer: Self-pay | Admitting: *Deleted

## 2014-08-03 ENCOUNTER — Other Ambulatory Visit: Payer: Self-pay | Admitting: Cardiothoracic Surgery

## 2014-08-03 DIAGNOSIS — I25119 Atherosclerotic heart disease of native coronary artery with unspecified angina pectoris: Secondary | ICD-10-CM

## 2014-08-03 DIAGNOSIS — I2583 Coronary atherosclerosis due to lipid rich plaque: Principal | ICD-10-CM

## 2014-08-03 DIAGNOSIS — I2510 Atherosclerotic heart disease of native coronary artery without angina pectoris: Secondary | ICD-10-CM

## 2014-08-03 NOTE — Telephone Encounter (Signed)
Pt notified of lab results. Pt states to me that Dr. Radford Pax has asked for him to repeat FLP/LFT 4/28 since he told her he had not fasted for the lab work. I apologized to pt that he should had been told to make sure to fast.

## 2014-08-04 ENCOUNTER — Ambulatory Visit
Admission: RE | Admit: 2014-08-04 | Discharge: 2014-08-04 | Disposition: A | Discharge status: Home / Self Care | Payer: BC Managed Care – PPO | Source: Ambulatory Visit | Attending: Cardiothoracic Surgery | Admitting: Cardiothoracic Surgery

## 2014-08-04 ENCOUNTER — Encounter: Payer: Self-pay | Admitting: Cardiothoracic Surgery

## 2014-08-04 ENCOUNTER — Ambulatory Visit (INDEPENDENT_AMBULATORY_CARE_PROVIDER_SITE_OTHER): Payer: BC Managed Care – PPO | Admitting: Cardiothoracic Surgery

## 2014-08-04 ENCOUNTER — Other Ambulatory Visit (INDEPENDENT_AMBULATORY_CARE_PROVIDER_SITE_OTHER): Payer: BC Managed Care – PPO | Admitting: *Deleted

## 2014-08-04 VITALS — BP 128/86 | HR 81 | Resp 20 | Ht 70.0 in | Wt 268.0 lb

## 2014-08-04 DIAGNOSIS — I2510 Atherosclerotic heart disease of native coronary artery without angina pectoris: Secondary | ICD-10-CM | POA: Diagnosis not present

## 2014-08-04 DIAGNOSIS — I25119 Atherosclerotic heart disease of native coronary artery with unspecified angina pectoris: Secondary | ICD-10-CM

## 2014-08-04 DIAGNOSIS — J90 Pleural effusion, not elsewhere classified: Secondary | ICD-10-CM

## 2014-08-04 DIAGNOSIS — E785 Hyperlipidemia, unspecified: Secondary | ICD-10-CM

## 2014-08-04 LAB — HEPATIC FUNCTION PANEL
ALT: 25 U/L (ref 0–53)
AST: 19 U/L (ref 0–37)
Albumin: 4.1 g/dL (ref 3.5–5.2)
Alkaline Phosphatase: 83 U/L (ref 39–117)
Bilirubin, Direct: 0.1 mg/dL (ref 0.0–0.3)
Total Bilirubin: 0.4 mg/dL (ref 0.2–1.2)
Total Protein: 6.7 g/dL (ref 6.0–8.3)

## 2014-08-04 LAB — LIPID PANEL
Cholesterol: 101 mg/dL (ref 0–200)
HDL: 28.8 mg/dL — ABNORMAL LOW (ref >39.00)
LDL Cholesterol: 41 mg/dL (ref 0–99)
NonHDL: 72.2
Total CHOL/HDL Ratio: 4
Triglycerides: 156 mg/dL — ABNORMAL HIGH (ref 0.0–149.0)
VLDL: 31.2 mg/dL (ref 0.0–40.0)

## 2014-08-04 NOTE — Addendum Note (Signed)
Addended by: Eulis Foster on: 08/04/2014 12:47 PM   Modules accepted: Orders

## 2014-08-04 NOTE — Progress Notes (Signed)
[address]       [place] [address]             [phone]      Colin Lee [place] Medical Record #[id number] Date of Birth: 1960-06-01  Referring: Sinda Du, MD Primary Care: Bronson Curb, [place]  Chief Complaint:   POST OP FOLLOW UP 02/01/2014 OPERATIVE REPORT PREOPERATIVE DIAGNOSIS: Recurrent left pleural effusion. POSTOPERATIVE DIAGNOSIS: Recurrent left pleural effusion. Final pathology pending. PROCEDURE PERFORMED: Bronchoscopy, left video-assisted thoracoscopy, drainage of left pleural effusion, pleural biopsies, talc pleurodesis, placement of chest tube and PleurX catheter. SURGEON: Lanelle Bal, MD  History of Present Illness:     Patient doing well following drainage of left pleural effusion intact pleurodesis  He shortness of breath has improved. He was  Evaluated  by rheumatology without any specific diagnosis. Pathology on his pleural fluid and pleural biopsy has been negative for malignancy. 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  The last time I saw the patient in January he he related that he had been having substernal discomfort was shoveling snow and was referred to cardiology. Initial stress test and echo were unrevealing ultimately the patient was admitted with unstable angina and underwent coronary stenting. Since that time he notes that he's felt better and has had no recurrent chest pain.  The patient denies any symptoms specific to his recent surgery.    Past Medical History  Diagnosis Date  . GERD (gastroesophageal reflux disease)   . Hypertension   . Shortness of breath     Starting May 2015  . Headache     stress and tension  . Neuromuscular disorder     restless legs and leg cramps  . Arthritis   . Asthma   . Coronary artery disease 2016    90% RCA and 30% LM S/P PCI of RCA       History  Smoking status  . Former Smoker -- 1.00 packs/day for 30 years  . Types: Cigarettes  . Quit date: 04/08/2000  Smokeless tobacco  . Never Used    History  Alcohol Use  . Yes    Comment: rare     No Known Allergies  Current Outpatient Prescriptions  Medication Sig Dispense Refill  . albuterol (PROVENTIL HFA;VENTOLIN HFA) 108 (90 BASE) MCG/ACT inhaler Inhale 2 puffs into the lungs every 6 (six) hours as needed for wheezing or shortness of breath.    Marland Kitchen aspirin EC 81 MG tablet Take 81 mg by mouth daily.    Marland Kitchen atorvastatin (LIPITOR) 40 MG tablet Take 1 tablet (40 mg total) by mouth daily at 6 PM. 30 tablet 0  . losartan (COZAAR) 50 MG tablet Take 50 mg by mouth daily.    . metoprolol tartrate (LOPRESSOR) 25 MG tablet Take 0.5 tablets (12.5 mg total) by mouth 2 (two) times daily. 60 tablet 0  . Multiple Vitamins-Minerals (CENTRUM ADULTS PO) Take 1 tablet by mouth daily.    . nitroGLYCERIN (NITROSTAT) 0.4 MG SL tablet Place 1 tablet (0.4 mg total) under the tongue every 5 (five) minutes as needed for chest pain. 25 tablet 3  . omeprazole (PRILOSEC) 20 MG capsule Take 20 mg by mouth daily.    . polyethylene glycol (MIRALAX / GLYCOLAX) packet Take 17 g by mouth daily. (Patient taking differently: Take 17 g by mouth daily as needed  for mild constipation or moderate constipation. ) 30 each 0  . rOPINIRole (REQUIP) 0.25 MG tablet Take 1 tablet (0.25 mg total) by mouth at bedtime. 30 tablet 0  . ticagrelor (BRILINTA) 90 MG TABS tablet Take 1 tablet (90 mg total) by mouth 2 (two) times daily. 60 tablet 1   No current facility-administered medications for this visit.       Physical Exam: BP 128/86 mmHg  Pulse 81  Resp 20  Ht 5\' 10"  (1.778 m)  Wt 268 lb (121.564 kg)  BMI 38.45 kg/m2  SpO2 98%  General appearance: alert and cooperative Neurologic: intact Heart: regular rate and rhythm, S1, S2 normal, no murmur, click, rub or gallop Lungs: diminished breath sounds  bibasilar Abdomen: soft, non-tender; bowel sounds normal; no masses,  no organomegaly Extremities: extremities normal, atraumatic, no cyanosis or edema and Homans sign is negative, no sign of DVT Wound: the left chest tube sutures And patient's left chest incisions well-healed   Diagnostic Studies & Laboratory data:     Recent Radiology Findings:   Dg Chest 2 View  08/04/2014   CLINICAL DATA:  Coronary artery disease.  EXAM: CHEST  2 VIEW  COMPARISON:  06/21/2014, 06/04/2014, 02/28/2014.  CT 06/02/2014.  FINDINGS: Mediastinum and hilar structures are normal. Basal heart size stable. No acute bony abnormality identified.  IMPRESSION: Bilateral pleural parenchymal scarring. No acute cardiopulmonary disease.   Electronically Signed   By: Marcello Moores  Register   On: 08/04/2014 11:35   I have independently reviewed the above radiology studies  and reviewed the findings with the patient.    Recent Lab Findings: Lab Results  Component Value Date   WBC 16.1* 06/08/2014   HGB 11.6* 06/08/2014   HCT 35.9* 06/08/2014   PLT 485* 06/08/2014   GLUCOSE 116* 06/13/2014   CHOL 104 08/02/2014   TRIG 125.0 08/02/2014   HDL 29.20* 08/02/2014   LDLCALC 50 08/02/2014   ALT 26 08/02/2014   AST 19 08/02/2014   NA 133* 06/13/2014   K 4.5 06/13/2014   CL 100 06/13/2014   CREATININE 1.10 06/13/2014   BUN 23 06/13/2014   CO2 27 06/13/2014   TSH 1.410 12/27/2013   INR 1.07 06/06/2014   HGBA1C 6.1* 10/06/2013      Assessment / Plan:    Patient with recurrent left pleural effusion now with drainage and talc pleurodesis of the recurrent left pleural effusion, pathology is negative for malignancy Follow-up chest x-ray is stable without recurrent effusions Patient had  rheumatology , was told he had no specific rheumatologic findings or abnormal tests. Since last seen and patient referred to cardiology he has had coronary artery stenting and is on antiplatelet therapy. Stable postop will see back in 6  months with repeat chest xray  Grace Isaac MD      [address] [place] [address] Office [phone]   Beeper [phone]  08/04/2014 12:00 PM

## 2014-09-14 ENCOUNTER — Emergency Department (HOSPITAL_COMMUNITY)
Admission: EM | Admit: 2014-09-14 | Discharge: 2014-09-14 | Disposition: A | Discharge status: Home / Self Care | Payer: BC Managed Care – PPO | Attending: Emergency Medicine | Admitting: Emergency Medicine

## 2014-09-14 ENCOUNTER — Encounter (HOSPITAL_COMMUNITY): Payer: Self-pay | Admitting: Emergency Medicine

## 2014-09-14 ENCOUNTER — Telehealth: Payer: Self-pay | Admitting: Cardiology

## 2014-09-14 ENCOUNTER — Emergency Department (HOSPITAL_COMMUNITY): Payer: BC Managed Care – PPO

## 2014-09-14 DIAGNOSIS — M1990 Unspecified osteoarthritis, unspecified site: Secondary | ICD-10-CM | POA: Insufficient documentation

## 2014-09-14 DIAGNOSIS — Z9889 Other specified postprocedural states: Secondary | ICD-10-CM | POA: Insufficient documentation

## 2014-09-14 DIAGNOSIS — Z7982 Long term (current) use of aspirin: Secondary | ICD-10-CM | POA: Insufficient documentation

## 2014-09-14 DIAGNOSIS — Z87891 Personal history of nicotine dependence: Secondary | ICD-10-CM | POA: Diagnosis not present

## 2014-09-14 DIAGNOSIS — I2510 Atherosclerotic heart disease of native coronary artery without angina pectoris: Secondary | ICD-10-CM | POA: Insufficient documentation

## 2014-09-14 DIAGNOSIS — I10 Essential (primary) hypertension: Secondary | ICD-10-CM | POA: Diagnosis not present

## 2014-09-14 DIAGNOSIS — J45909 Unspecified asthma, uncomplicated: Secondary | ICD-10-CM | POA: Diagnosis not present

## 2014-09-14 DIAGNOSIS — Z79899 Other long term (current) drug therapy: Secondary | ICD-10-CM | POA: Insufficient documentation

## 2014-09-14 DIAGNOSIS — K219 Gastro-esophageal reflux disease without esophagitis: Secondary | ICD-10-CM | POA: Diagnosis not present

## 2014-09-14 DIAGNOSIS — Z8669 Personal history of other diseases of the nervous system and sense organs: Secondary | ICD-10-CM | POA: Insufficient documentation

## 2014-09-14 DIAGNOSIS — R079 Chest pain, unspecified: Secondary | ICD-10-CM | POA: Diagnosis not present

## 2014-09-14 LAB — BASIC METABOLIC PANEL
Anion gap: 10 (ref 5–15)
BUN: 15 mg/dL (ref 6–20)
CO2: 24 mmol/L (ref 22–32)
Calcium: 8.9 mg/dL (ref 8.9–10.3)
Chloride: 106 mmol/L (ref 101–111)
Creatinine, Ser: 1.04 mg/dL (ref 0.61–1.24)
GFR calc Af Amer: >60 mL/min (ref >60)
GFR calc non Af Amer: >60 mL/min (ref >60)
Glucose, Bld: 123 mg/dL — ABNORMAL HIGH (ref 65–99)
Potassium: 3.7 mmol/L (ref 3.5–5.1)
Sodium: 140 mmol/L (ref 135–145)

## 2014-09-14 LAB — CBC
HCT: 37 % — ABNORMAL LOW (ref 39.0–52.0)
Hemoglobin: 12.2 g/dL — ABNORMAL LOW (ref 13.0–17.0)
MCH: 27.4 pg (ref 26.0–34.0)
MCHC: 33 g/dL (ref 30.0–36.0)
MCV: 83 fL (ref 78.0–100.0)
Platelets: 293 10*3/uL (ref 150–400)
RBC: 4.46 MIL/uL (ref 4.22–5.81)
RDW: 13.3 % (ref 11.5–15.5)
WBC: 8.9 10*3/uL (ref 4.0–10.5)

## 2014-09-14 LAB — TROPONIN I
Troponin I: <0.03 ng/mL (ref <0.031)
Troponin I: <0.03 ng/mL (ref <0.031)

## 2014-09-14 NOTE — Telephone Encounter (Signed)
New problem   Pt has question about having to take nitro when he have chest pain.

## 2014-09-14 NOTE — ED Provider Notes (Signed)
CSN: [id number]     Arrival date & time 09/14/14  1324 History   First MD Initiated Contact with Patient 09/14/14 1347     Chief Complaint  Patient presents with  . Chest Pain     (Consider location/radiation/quality/duration/timing/severity/associated sxs/prior Treatment) Patient is a 54 y.o. male presenting with chest pain. The history is provided by the patient (the pt states he had some chest pain today.  it was relieved by 2 nitro).  Chest Pain Pain location:  L chest Pain quality: aching   Pain radiates to:  Does not radiate Pain radiates to the back: no   Pain severity:  Mild Onset quality:  Gradual Timing:  Intermittent Progression:  Waxing and waning Associated symptoms: no abdominal pain, no back pain, no cough, no fatigue and no headache     Past Medical History  Diagnosis Date  . GERD (gastroesophageal reflux disease)   . Hypertension   . Shortness of breath     Starting May 2015  . Headache     stress and tension  . Neuromuscular disorder     restless legs and leg cramps  . Arthritis   . Asthma   . Coronary artery disease 2016    90% RCA and 30% LM S/P PCI of RCA   Past Surgical History  Procedure Laterality Date  . No prior surgery    . Thoracentesis Left 2015  . Colonoscopy w/ biopsies and polypectomy  2014    benign  . Video assisted thoracoscopy Left 02/01/2014    Procedure: VIDEO ASSISTED THORACOSCOPY;  Surgeon: Grace Isaac, MD;  Location: [place];  Service: Thoracic;  Laterality: Left;  . Pleural effusion drainage Left 02/01/2014    Procedure: DRAINAGE OF PLEURAL EFFUSION;  Surgeon: Grace Isaac, MD;  Location: [place];  Service: Thoracic;  Laterality: Left;  . Video bronchoscopy N/A 02/01/2014    Procedure: VIDEO BRONCHOSCOPY;  Surgeon: Grace Isaac, MD;  Location: [place];  Service: Thoracic;  Laterality: N/A;  . Pleural biopsy Left 02/01/2014    Procedure: PLEURAL BIOPSY;  Surgeon: Grace Isaac, MD;  Location: [place];  Service:  Thoracic;  Laterality: Left;  . Chest tube insertion Left 02/01/2014    Procedure: INSERTION PLEURAL DRAINAGE CATHETER;  Surgeon: Grace Isaac, MD;  Location: [place];  Service: Thoracic;  Laterality: Left;  . Talc pleurodesis Left 02/01/2014    Procedure: TALC PLEURADESIS;  Surgeon: Grace Isaac, MD;  Location: [place];  Service: Thoracic;  Laterality: Left;  . Removal of pleural drainage catheter Left 02/16/2014    Procedure: REMOVAL OF PLEURAL DRAINAGE CATHETER;  Surgeon: Grace Isaac, MD;  Location: [place];  Service: Thoracic;  Laterality: Left;  . Left heart catheterization with coronary angiogram N/A 06/07/2014    Procedure: LEFT HEART CATHETERIZATION WITH CORONARY ANGIOGRAM;  Surgeon: Peter M Martinique, MD;  Location: [facility] CATH LAB;  Service: Cardiovascular;  Laterality: N/A;  . Left heart catheterization with coronary angiogram N/A 06/08/2014    Procedure: LEFT HEART CATHETERIZATION WITH CORONARY ANGIOGRAM;  Surgeon: Sinclair Grooms, MD;  Location: [facility] CATH LAB;  Service: Cardiovascular;  Laterality: N/A;   Family History  Problem Relation Age of Onset  . Adopted: Yes  . Family history unknown: Yes   History  Substance Use Topics  . Smoking status: Former Smoker -- 1.00 packs/day for 30 years    Types: Cigarettes    Quit date: 04/08/2000  . Smokeless tobacco: Never Used  . Alcohol Use:  Yes     Comment: rare    Review of Systems  Constitutional: Negative for appetite change and fatigue.  HENT: Negative for congestion, ear discharge and sinus pressure.   Eyes: Negative for discharge.  Respiratory: Negative for cough.   Cardiovascular: Positive for chest pain.  Gastrointestinal: Negative for abdominal pain and diarrhea.  Genitourinary: Negative for frequency and hematuria.  Musculoskeletal: Negative for back pain.  Skin: Negative for rash.  Neurological: Negative for seizures and headaches.  Psychiatric/Behavioral: Negative for hallucinations.      Allergies  Review  of patient's allergies indicates no known allergies.  Home Medications   Prior to Admission medications   Medication Sig Start Date End Date Taking? Authorizing Provider  acetaminophen (TYLENOL) 500 MG tablet Take 1,000 mg by mouth every 6 (six) hours as needed for moderate pain.   Yes Historical Provider, MD  albuterol (PROVENTIL HFA;VENTOLIN HFA) 108 (90 BASE) MCG/ACT inhaler Inhale 2 puffs into the lungs every 6 (six) hours as needed for wheezing or shortness of breath.   Yes Historical Provider, MD  aspirin EC 81 MG tablet Take 81 mg by mouth daily.   Yes Historical Provider, MD  atorvastatin (LIPITOR) 40 MG tablet Take 1 tablet (40 mg total) by mouth daily at 6 PM. 06/09/14  Yes Hosie Poisson, MD  gabapentin (NEURONTIN) 100 MG capsule Take 2 capsules by mouth 3 (three) times daily.  09/02/14  Yes Historical Provider, MD  HYDROcodone-acetaminophen (NORCO) 7.5-325 MG per tablet Take 1 tablet by mouth daily as needed. 07/13/14  Yes Historical Provider, MD  losartan (COZAAR) 50 MG tablet Take 50 mg by mouth daily.   Yes Historical Provider, MD  metoprolol tartrate (LOPRESSOR) 25 MG tablet Take 0.5 tablets (12.5 mg total) by mouth 2 (two) times daily. 06/09/14  Yes Hosie Poisson, MD  Multiple Vitamins-Minerals (CENTRUM ADULTS PO) Take 1 tablet by mouth daily.   Yes Historical Provider, MD  nitroGLYCERIN (NITROSTAT) 0.4 MG SL tablet Place 1 tablet (0.4 mg total) under the tongue every 5 (five) minutes as needed for chest pain. 06/10/14  Yes Sueanne Margarita, MD  omeprazole (PRILOSEC) 20 MG capsule Take 20 mg by mouth daily.   Yes Historical Provider, MD  polyethylene glycol (MIRALAX / GLYCOLAX) packet Take 17 g by mouth daily. Patient taking differently: Take 17 g by mouth daily as needed for mild constipation or moderate constipation.  10/30/13  Yes Samuella Cota, MD  ticagrelor (BRILINTA) 90 MG TABS tablet Take 1 tablet (90 mg total) by mouth 2 (two) times daily. 06/09/14  Yes Hosie Poisson, MD  rOPINIRole  (REQUIP) 0.25 MG tablet Take 1 tablet (0.25 mg total) by mouth at bedtime. Patient not taking: Reported on 09/14/2014 10/11/13   Kathie Dike, MD   BP 122/74 mmHg  Pulse 78  Temp(Src) 99 F (37.2 C) (Oral)  Resp 16  Ht 5\' 10"  (1.778 m)  Wt 264 lb (119.75 kg)  BMI 37.88 kg/m2  SpO2 100% Physical Exam  Constitutional: He is oriented to person, place, and time. He appears well-developed.  HENT:  Head: Normocephalic.  Eyes: Conjunctivae and EOM are normal. No scleral icterus.  Neck: Neck supple. No thyromegaly present.  Cardiovascular: Normal rate and regular rhythm.  Exam reveals no gallop and no friction rub.   No murmur heard. Pulmonary/Chest: No stridor. He has no wheezes. He has no rales. He exhibits no tenderness.  Abdominal: He exhibits no distension. There is no tenderness. There is no rebound.  Musculoskeletal: Normal range of  motion. He exhibits no edema.  Lymphadenopathy:    He has no cervical adenopathy.  Neurological: He is oriented to person, place, and time. He exhibits normal muscle tone. Coordination normal.  Skin: No rash noted. No erythema.  Psychiatric: He has a normal mood and affect. His behavior is normal.    ED Course  Procedures (including critical care time) Labs Review Labs Reviewed  CBC - Abnormal; Notable for the following:    Hemoglobin 12.2 (*)    HCT 37.0 (*)    All other components within normal limits  BASIC METABOLIC PANEL - Abnormal; Notable for the following:    Glucose, Bld 123 (*)    All other components within normal limits  TROPONIN I  TROPONIN I    Imaging Review Dg Chest Port 1 View  09/14/2014   CLINICAL DATA:  54 year old male with chest pain since 0930 hours today while heavy lifting. Initial encounter.  EXAM: PORTABLE CHEST - 1 VIEW  COMPARISON:  08/04/2014 and earlier.  FINDINGS: Portable AP upright view at 1356 hours. Mildly lower lung volumes. Stable cardiac size and mediastinal contours. Large body habitus. No pneumothorax,  pulmonary edema, definite consolidation or pleural effusion. Visualized tracheal air column is within normal limits.  IMPRESSION: Stable portable radiographic appearance of the chest. No acute cardiopulmonary abnormality.   Electronically Signed   By: Genevie Ann M.D.   On: 09/14/2014 14:08     EKG Interpretation   Date/Time:  Wednesday September 14 2014 13:25:58 EDT Ventricular Rate:  87 PR Interval:  142 QRS Duration: 92 QT Interval:  368 QTC Calculation: 442 R Axis:   4 Text Interpretation:  Normal sinus rhythm Cannot rule out Anterior infarct  , age undetermined Abnormal ECG Confirmed by POLLINA  MD, [place]  [phone]) on 09/14/2014 1:43:56 PM      MDM   Final diagnoses:  Chest pain at rest    Spoke to cardiology and it was felt with 2 nl troponins the pt could follow up next week with cards   Milton Ferguson, MD 09/14/14 1814

## 2014-09-14 NOTE — Telephone Encounter (Signed)
Left message to call back  

## 2014-09-14 NOTE — Telephone Encounter (Signed)
Patient st he had chest pain since this AM and did not know how to take his Nitro. Patient st that although he did not know how to take it, he took one Nitro this AM and the chest pain did subside some. Patient st he had no other symptoms than CP. Reviewed medication instructions with patient to his satisfaction.  Patient st he is going to the ED to get checked out. He is nervous about the episode he had this morning and does complain of intermittent CP since.

## 2014-09-14 NOTE — Discharge Instructions (Signed)
Follow up with your heart md next week,  Return sooner if problems

## 2014-09-14 NOTE — ED Notes (Signed)
Pt reports chest pain since lifting paint cans this am. Pt reports had "a few" stents placed in march. Pt reports pain 3/10 . Pt denies any nausea. Pt reports took one nitro at 1045 this am, pain subsided. Pt reports took another nitro at 1310 prior to arrival. Pt reports pain is now a "pressure".

## 2014-09-21 ENCOUNTER — Encounter (HOSPITAL_COMMUNITY): Payer: Self-pay | Admitting: *Deleted

## 2014-10-09 IMAGING — CR DG CHEST DECUBITUS*L*
2 series · 2 of 2 positions shown · non-contrast
Comparison: Same day.

CLINICAL DATA: Chest pain.

EXAM:
CHEST - LEFT DECUBITUS

[view not recorded (1 of 2)]
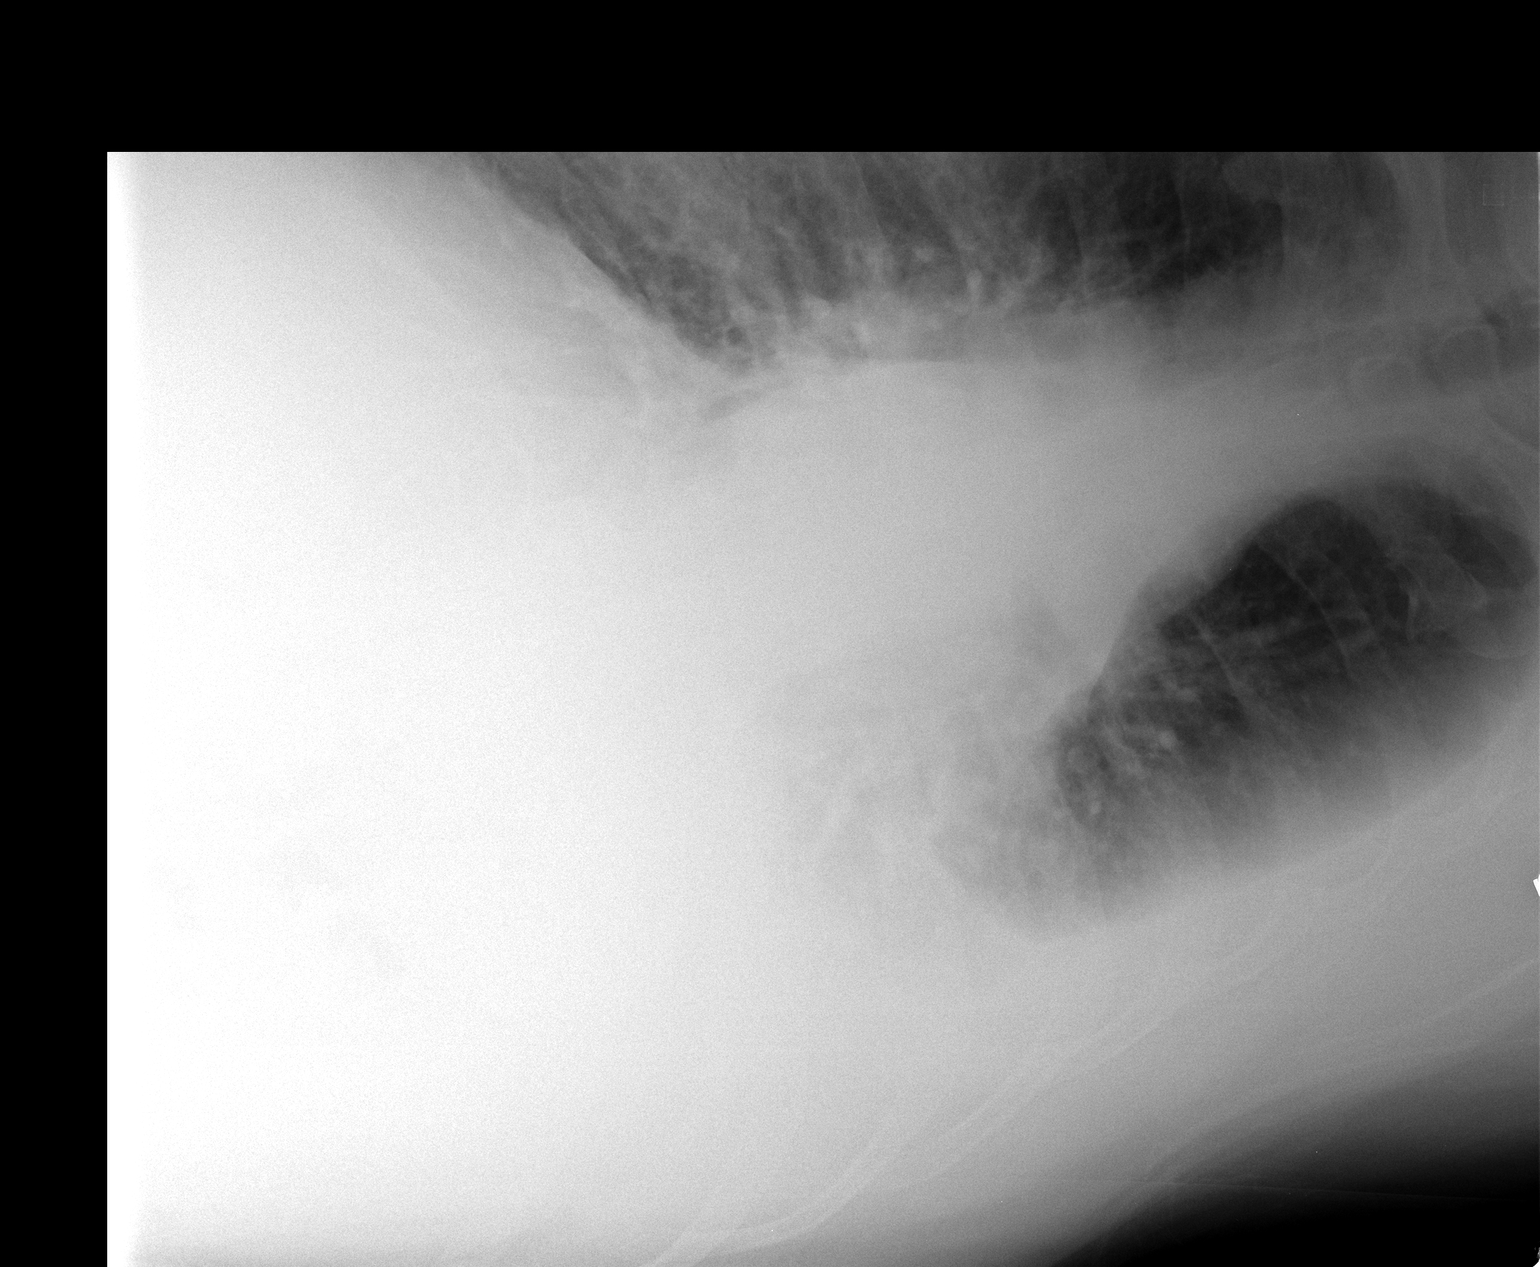

[view not recorded (2 of 2)]
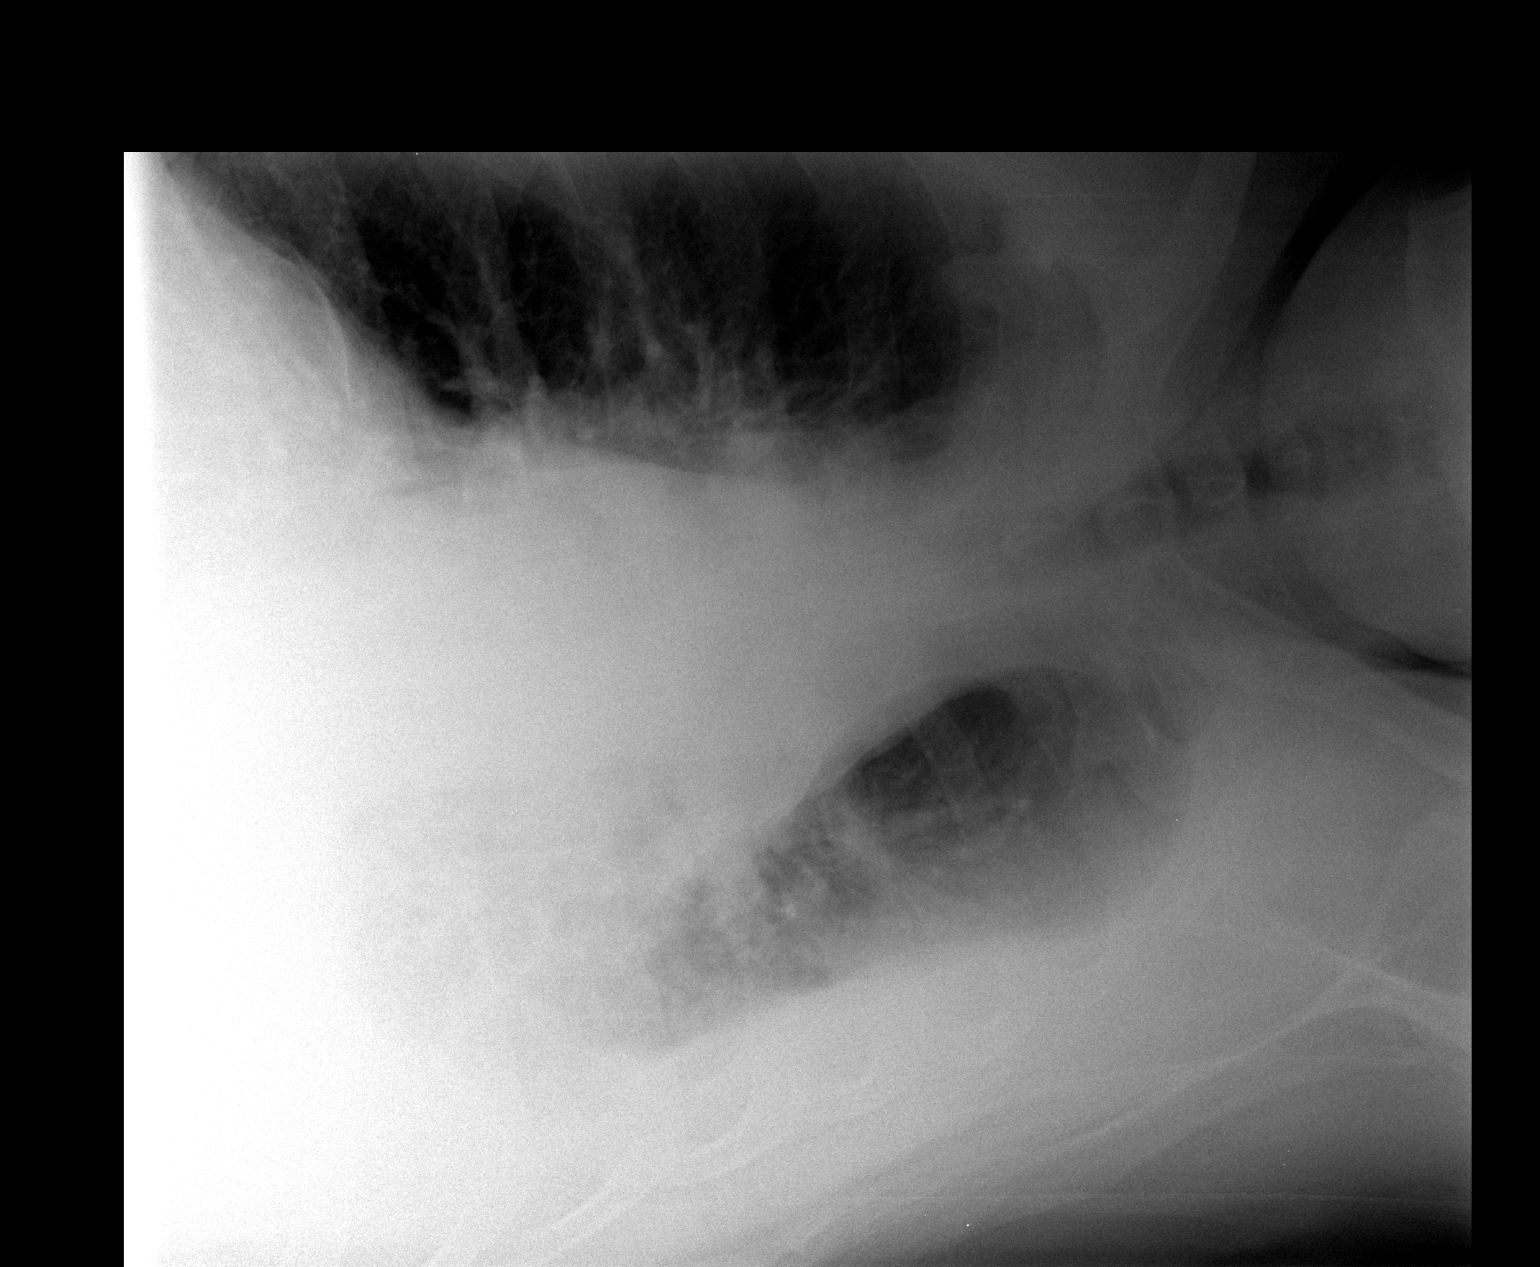

[2 of 2 positions shown; findings below may reference images not displayed]

FINDINGS: Left lateral decubitus view of the chest demonstrates mild
free-flowing left-sided pleural effusion. Right lung appears clear.
IMPRESSION: Mild free flowing left pleural effusion.

## 2014-11-10 ENCOUNTER — Encounter: Payer: Self-pay | Admitting: Cardiology

## 2014-12-07 IMAGING — CR DG CHEST 2V
2 series · 2 of 2 positions shown · non-contrast
Comparison: February 02, 2014.

CLINICAL DATA: Status post thoracic surgery.

EXAM:
CHEST  2 VIEW

[w chest pa]
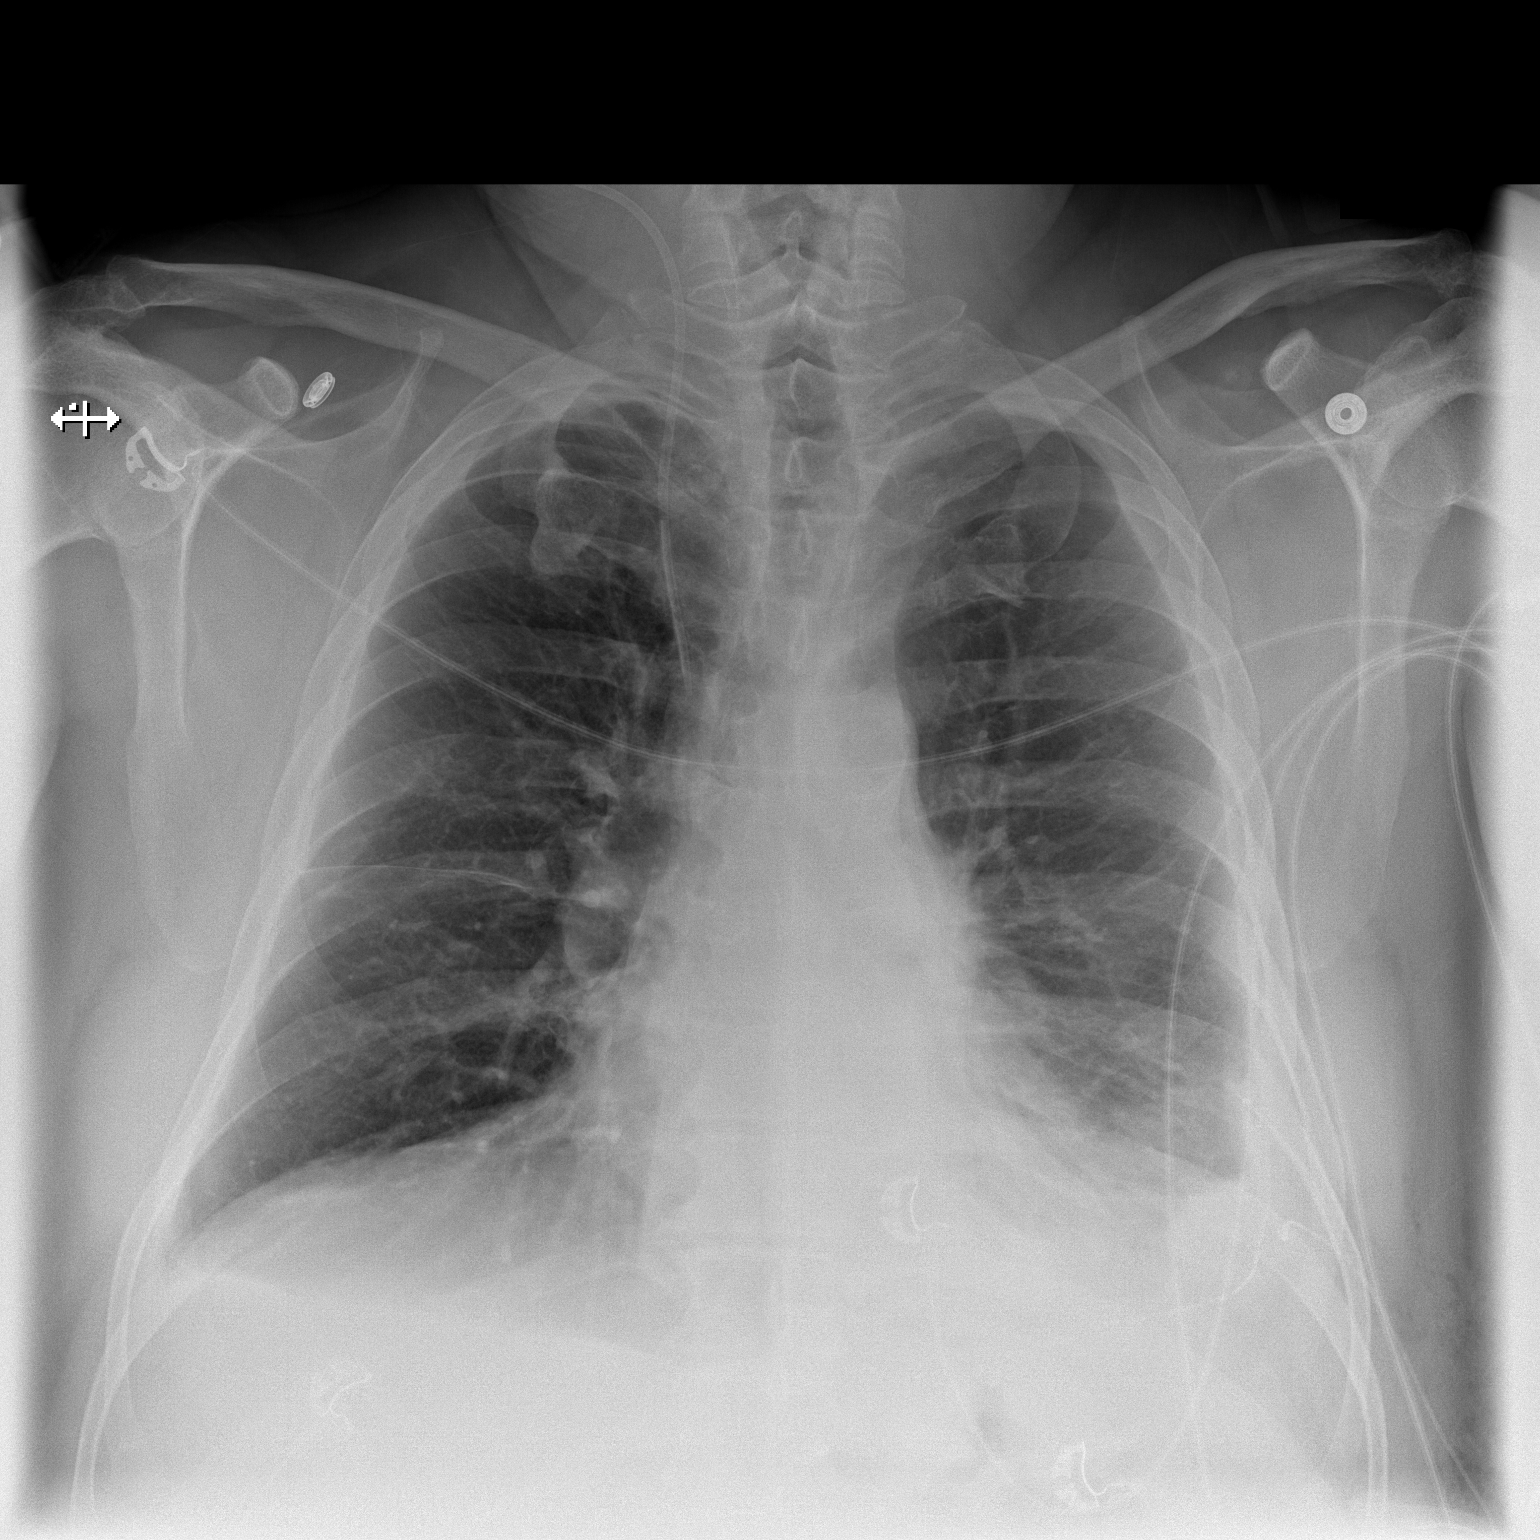

[w chest lat]
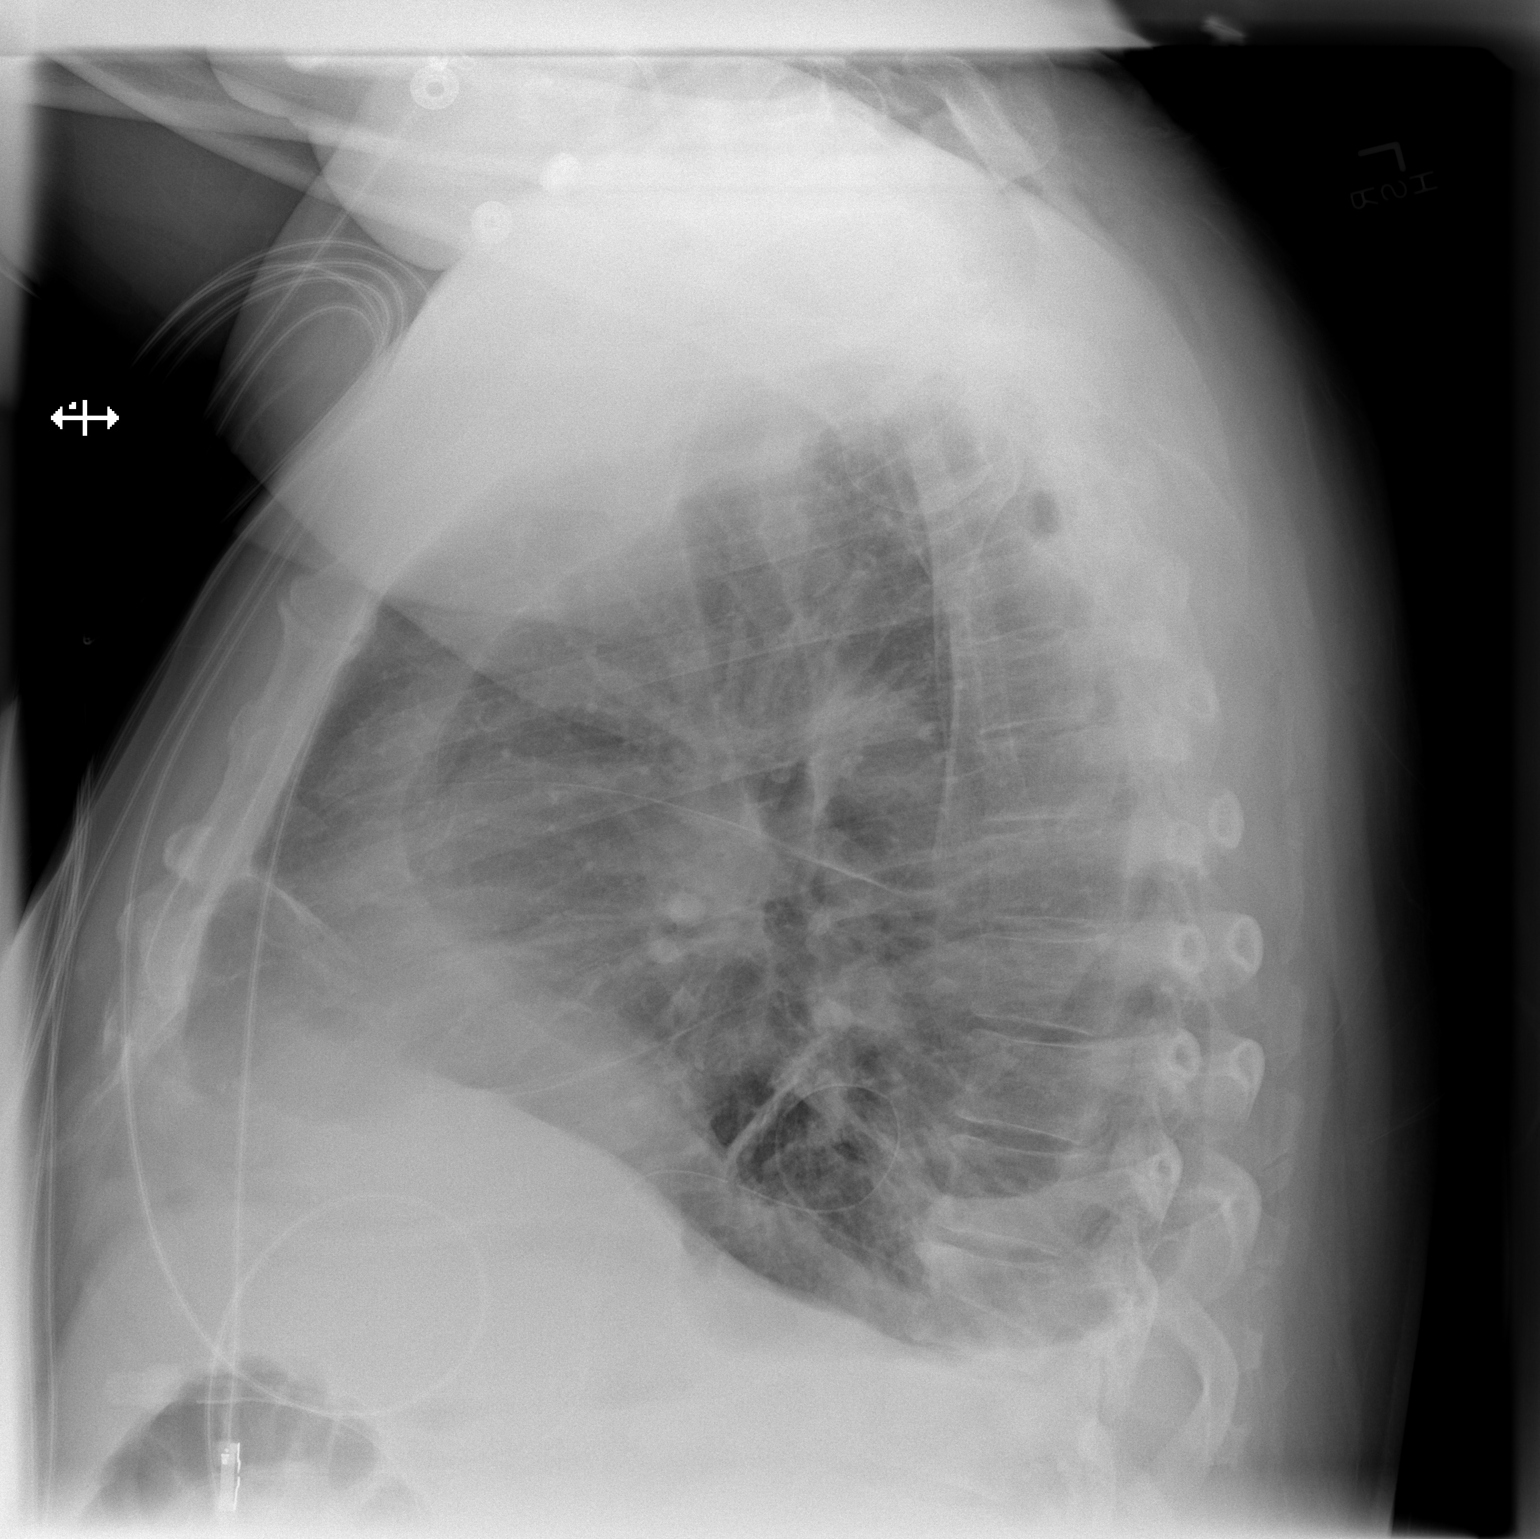

[2 of 2 positions shown; findings below may reference images not displayed]

FINDINGS: No pneumothorax is noted. Stable mild bilateral pleural effusions
are noted with left greater than right. Right internal jugular
catheter line is unchanged in position with distal tip overlying
expected position of the SVC. Stable mild bibasilar subsegmental
atelectasis is noted. Bony thorax is intact.
IMPRESSION: Stable mild bilateral basilar subsegmental atelectasis and pleural
effusions. No significant change compared to prior exam.

## 2014-12-14 IMAGING — CR DG CHEST 2V
2 series · 2 of 2 positions shown · non-contrast
Comparison: Chest x-ray of 02/03/2014

CLINICAL DATA: VATS 2 weeks ago, history of pleural effusion and
shortness of breath, followup, former smoking history

EXAM:
CHEST  2 VIEW

[w chest pa]
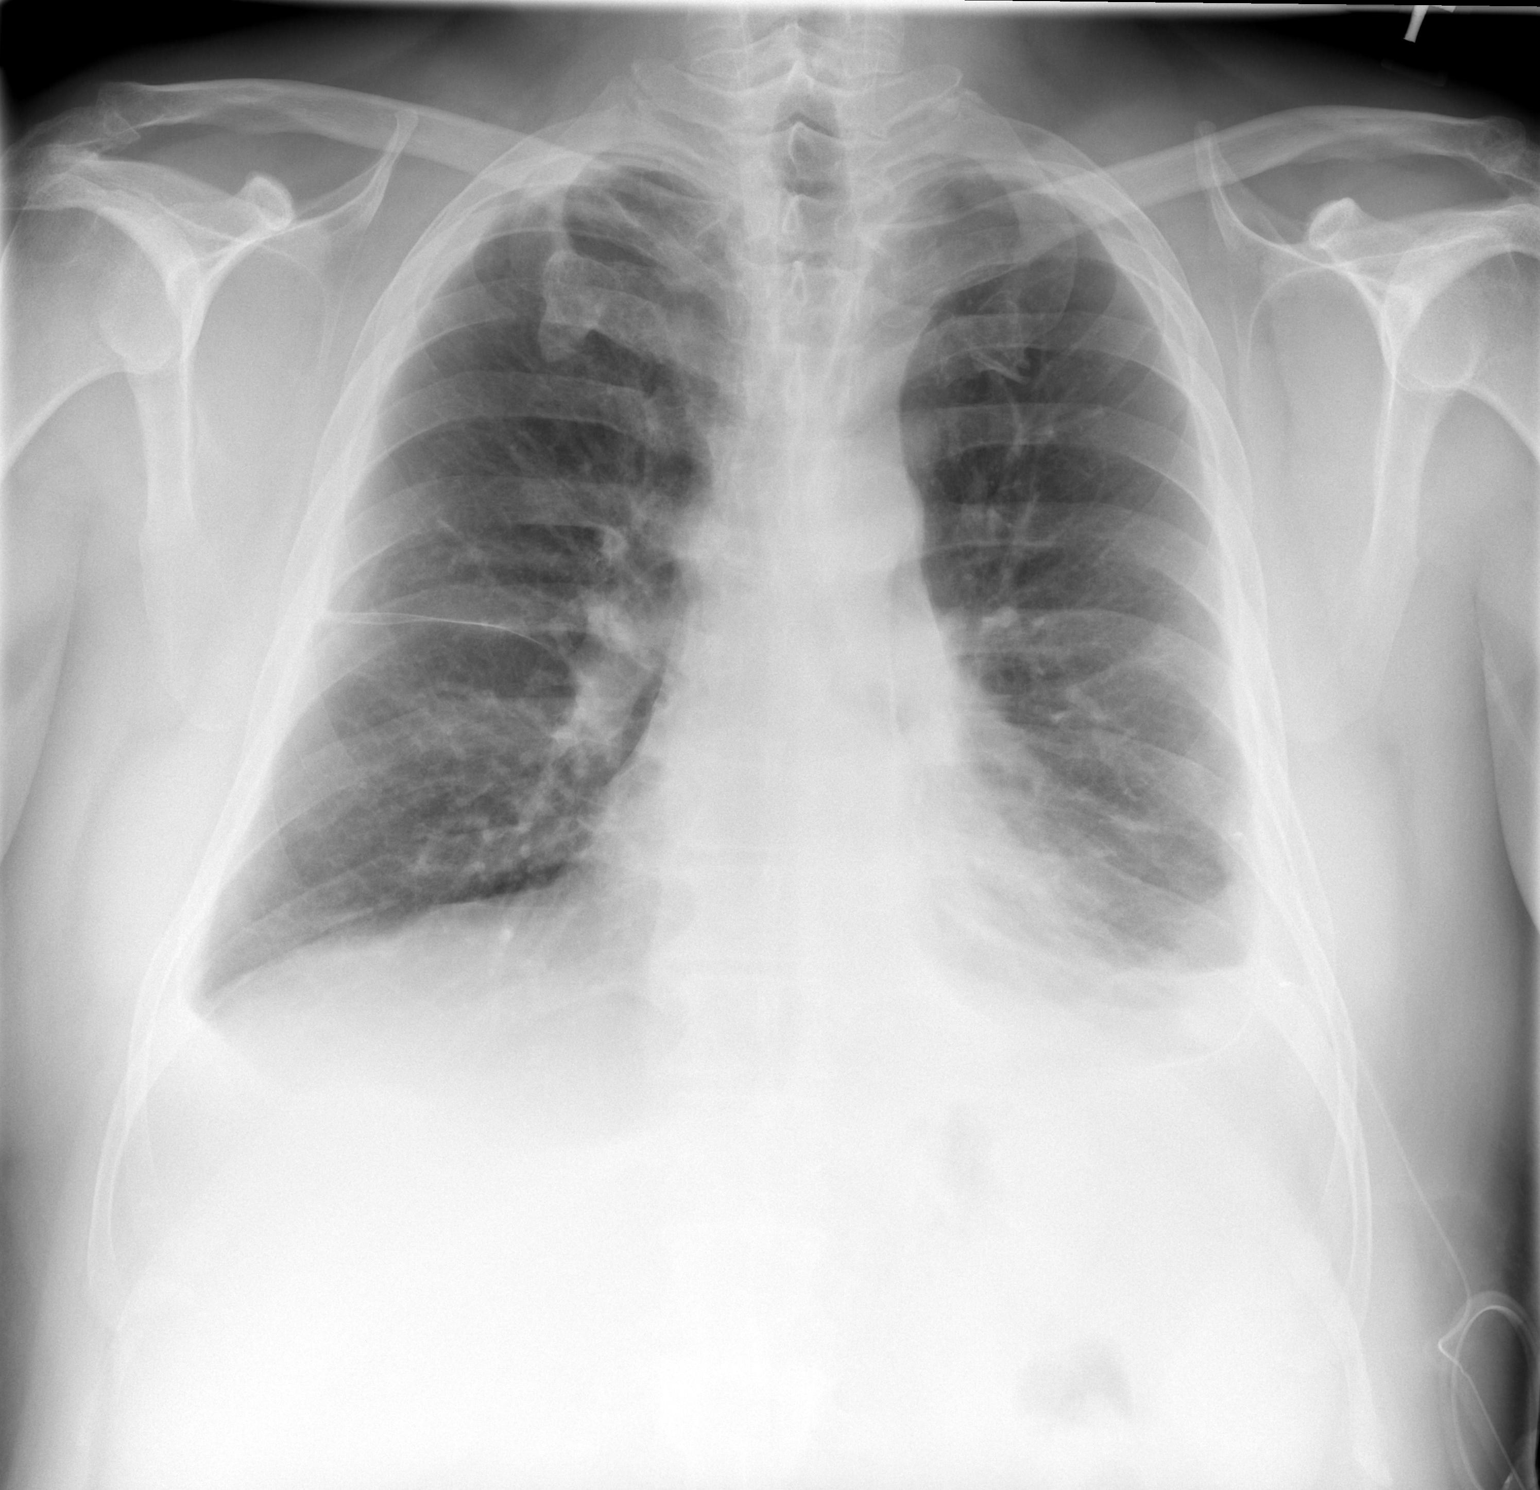

[w chest lat]
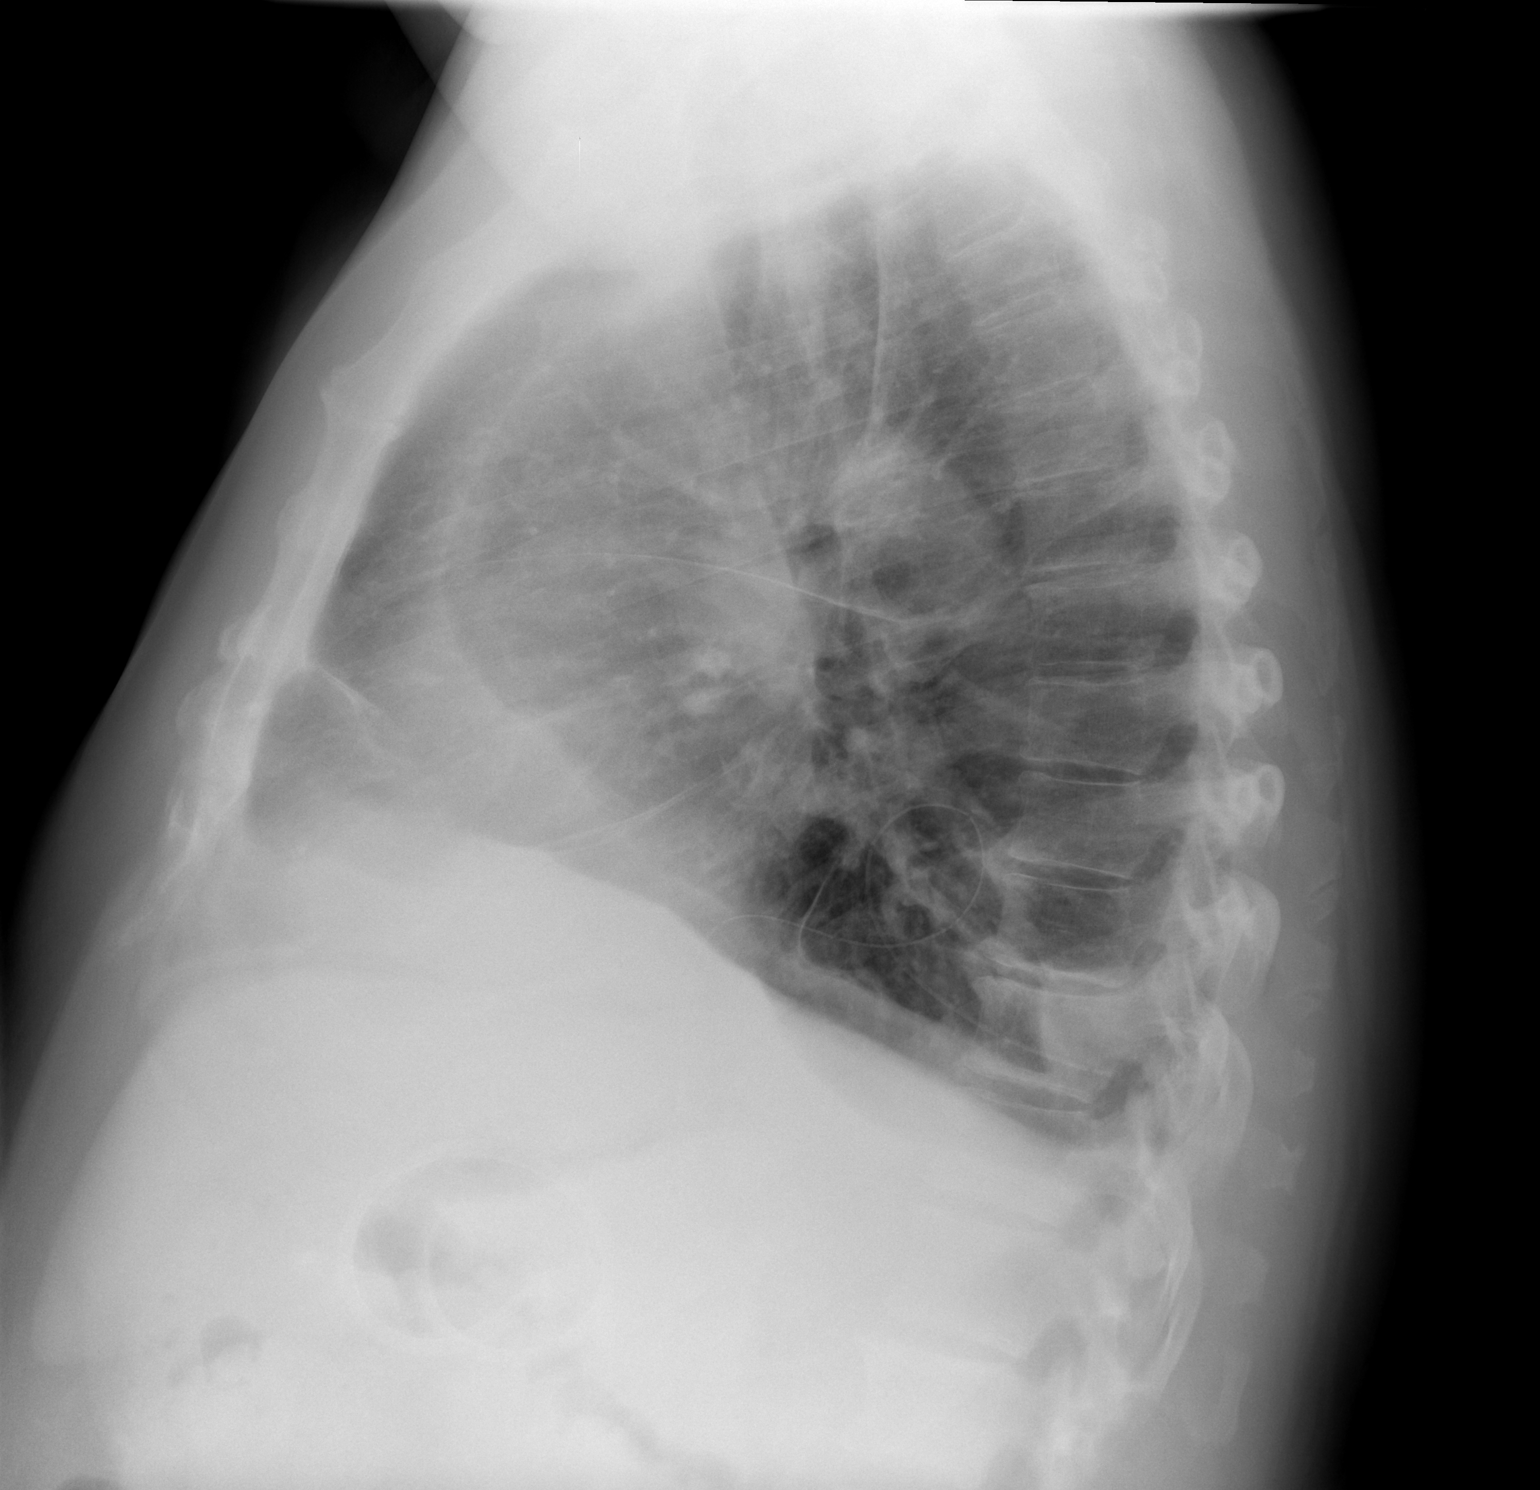

[2 of 2 positions shown; findings below may reference images not displayed]

FINDINGS: Blunting of the costophrenic angles remains left greater than right
most consistent were pleural thickening and volume loss. No focal
infiltrate is seen. An opacity posteriorly on the lateral view
probably on the left may represent loculation of fluid. Cardiomegaly
is stable. No acute bony abnormality is seen.
IMPRESSION: Little change in pleural thickening and/or loculated fluid at the
lung bases with basilar volume loss. Question small collection of
loculated fluid posteriorly at the left lung base.

## 2015-02-02 ENCOUNTER — Encounter: Payer: Self-pay | Admitting: Cardiology

## 2015-02-02 ENCOUNTER — Ambulatory Visit (INDEPENDENT_AMBULATORY_CARE_PROVIDER_SITE_OTHER): Payer: BC Managed Care – PPO | Admitting: Cardiology

## 2015-02-02 VITALS — BP 124/72 | HR 89 | Ht 70.0 in | Wt 274.0 lb

## 2015-02-02 DIAGNOSIS — I10 Essential (primary) hypertension: Secondary | ICD-10-CM | POA: Diagnosis not present

## 2015-02-02 DIAGNOSIS — E785 Hyperlipidemia, unspecified: Secondary | ICD-10-CM

## 2015-02-02 DIAGNOSIS — I2583 Coronary atherosclerosis due to lipid rich plaque: Principal | ICD-10-CM

## 2015-02-02 DIAGNOSIS — I2510 Atherosclerotic heart disease of native coronary artery without angina pectoris: Secondary | ICD-10-CM | POA: Diagnosis not present

## 2015-02-02 MED ORDER — NITROGLYCERIN 0.4 MG SL SUBL: 0.4000 mg | SUBLINGUAL_TABLET | SUBLINGUAL | Status: DC | PRN

## 2015-02-02 NOTE — Progress Notes (Signed)
Cardiology Office Note   Date:  02/02/2015   ID:  Colin Lee, DOB 1960-06-01, MRN [id number]  PCP:  Bronson Curb, PA-C    No chief complaint on file.     History of Present Illness: Colin Lee is a 54 y.o. male with a hx of recurrent left pleural effusion and has had drainage and talc pleurodesis with Dr. [name]. He has a history of ASCAD with cath showing high grade proximal RCA stenosis that was treated with DES x 2. He had recurrent CP post PCI. Re-look cath demonstrated patent stents in the RCA. He has a history of moderate pleural effusion and community acquired pneumonia. He underwent thoracocentesis and was found to have bloody effusion with fluid analysis showing lymphocytes. ID and pulmonary consulted and suggested this could be rheumatologic in origin and rheumatology work up ordered.   He returns for FU. He is feeling good. Denies further chest pain. He has chronic SOB that has improved and only comes on with extreme exertion. NoLE edema, palpitations or syncope. He has been having problems with dizziness with movement of his body. He denies any lightheadedness or feeling that he is going to pass out.   He walks 15 minutes daily.    Past Medical History  Diagnosis Date  . GERD (gastroesophageal reflux disease)   . Hypertension   . Shortness of breath     Starting May 2015  . Headache     stress and tension  . Neuromuscular disorder (HCC)     restless legs and leg cramps  . Arthritis   . Asthma   . Coronary artery disease 2016    90% RCA and 30% LM S/P PCI of RCA    Past Surgical History  Procedure Laterality Date  . No prior surgery    . Thoracentesis Left 2015  . Colonoscopy w/ biopsies and polypectomy  2014    benign  . Video assisted thoracoscopy Left 02/01/2014    Procedure: VIDEO ASSISTED THORACOSCOPY;  Surgeon: Grace Isaac, MD;  Location: [facility];  Service: Thoracic;  Laterality: Left;  .  Pleural effusion drainage Left 02/01/2014    Procedure: DRAINAGE OF PLEURAL EFFUSION;  Surgeon: Grace Isaac, MD;  Location: [facility];  Service: Thoracic;  Laterality: Left;  . Video bronchoscopy N/A 02/01/2014    Procedure: VIDEO BRONCHOSCOPY;  Surgeon: Grace Isaac, MD;  Location: [facility];  Service: Thoracic;  Laterality: N/A;  . Pleural biopsy Left 02/01/2014    Procedure: PLEURAL BIOPSY;  Surgeon: Grace Isaac, MD;  Location: [facility];  Service: Thoracic;  Laterality: Left;  . Chest tube insertion Left 02/01/2014    Procedure: INSERTION PLEURAL DRAINAGE CATHETER;  Surgeon: Grace Isaac, MD;  Location: [facility];  Service: Thoracic;  Laterality: Left;  . Talc pleurodesis Left 02/01/2014    Procedure: TALC PLEURADESIS;  Surgeon: Grace Isaac, MD;  Location: [facility];  Service: Thoracic;  Laterality: Left;  . Removal of pleural drainage catheter Left 02/16/2014    Procedure: REMOVAL OF PLEURAL DRAINAGE CATHETER;  Surgeon: Grace Isaac, MD;  Location: [facility];  Service: Thoracic;  Laterality: Left;  . Left heart catheterization with coronary angiogram N/A 06/07/2014    Procedure: LEFT HEART CATHETERIZATION WITH CORONARY ANGIOGRAM;  Surgeon: Peter M Martinique, MD;  [facility];  Service: Cardiovascular;  Laterality: N/A;  . Left heart catheterization with coronary  angiogram N/A 06/08/2014    Procedure: LEFT HEART CATHETERIZATION WITH CORONARY ANGIOGRAM;  Surgeon: Sinclair Grooms, MD;  Location: [facility] CATH LAB;  Service: Cardiovascular;  Laterality: N/A;     Current Outpatient Prescriptions  Medication Sig Dispense Refill  . acetaminophen (TYLENOL) 500 MG tablet Take 1,000 mg by mouth every 6 (six) hours as needed for moderate pain.    Marland Kitchen aspirin EC 81 MG tablet Take 81 mg by mouth daily.    Marland Kitchen atorvastatin (LIPITOR) 40 MG tablet Take 1 tablet (40 mg total) by mouth daily at 6 PM. 30 tablet 0  . losartan (COZAAR) 50 MG tablet Take 50 mg by mouth daily.    . metoprolol tartrate  (LOPRESSOR) 25 MG tablet Take 0.5 tablets (12.5 mg total) by mouth 2 (two) times daily. 60 tablet 0  . Multiple Vitamins-Minerals (CENTRUM ADULTS PO) Take 1 tablet by mouth daily.    . nitroGLYCERIN (NITROSTAT) 0.4 MG SL tablet Place 1 tablet (0.4 mg total) under the tongue every 5 (five) minutes as needed for chest pain. 25 tablet 3  . omeprazole (PRILOSEC) 20 MG capsule Take 20 mg by mouth daily.    . polyethylene glycol (MIRALAX / GLYCOLAX) packet Take 17 g by mouth daily as needed.    . ticagrelor (BRILINTA) 90 MG TABS tablet Take 1 tablet (90 mg total) by mouth 2 (two) times daily. 60 tablet 1   No current facility-administered medications for this visit.    Allergies:   Review of patient's allergies indicates no known allergies.    Social History:  The patient  reports that he quit smoking about 14 years ago. His smoking use included Cigarettes. He has a 30 pack-year smoking history. He has never used smokeless tobacco. He reports that he drinks alcohol. He reports that he does not use illicit drugs.   Family History:  The patient's He was adopted. Family history is unknown by patient.    ROS:  Please see the history of present illness.   Otherwise, review of systems are positive for none.   All other systems are reviewed and negative.    PHYSICAL EXAM: VS:  BP 124/72 mmHg  Pulse 89  Ht 5\' 10"  (1.778 m)  Wt 274 lb (124.286 kg)  BMI 39.32 kg/m2 , BMI Body mass index is 39.32 kg/(m^2). GEN: Well nourished, well developed, in no acute distress HEENT: normal Neck: no JVD, carotid bruits, or masses Cardiac: RRR; no murmurs, rubs, or gallops,no edema  Respiratory:  clear to auscultation bilaterally, normal work of breathing GI: soft, nontender, nondistended, + BS MS: no deformity or atrophy Skin: warm and dry, no rash Neuro:  Strength and sensation are intact Psych: euthymic mood, full affect   EKG:  EKG is not ordered today.    Recent Labs: 05/30/2014: B Natriuretic  Peptide 92.5 08/04/2014: ALT 25 09/14/2014: BUN 15; Creatinine, Ser 1.04; Hemoglobin 12.2*; Platelets 293; Potassium 3.7; Sodium 140    Lipid Panel    Component Value Date/Time   CHOL 101 08/04/2014 1247   TRIG 156.0* 08/04/2014 1247   HDL 28.80* 08/04/2014 1247   CHOLHDL 4 08/04/2014 1247   VLDL 31.2 08/04/2014 1247   LDLCALC 41 08/04/2014 1247      Wt Readings from Last 3 Encounters:  02/02/15 274 lb (124.286 kg)  09/14/14 264 lb (119.75 kg)  08/04/14 268 lb (121.564 kg)    ASSESSMENT AND PLAN:  Coronary artery disease Doing well after recent PCI with DES to RCA.He will continue  dual antiplatelet therapy.  He is walking about an hour a day now. I have encouraged him to continue to remain active. Continue ASA, Brilinta, beta-blocker, angiotensin receptor blocker, statin.  Essential hypertension Controlled on cozaar/BB  Dyslipidemia - Plan: Hepatic function panel, Lipid panel Continue statin. Arrange FLP and ALT fasting   Recurrent Pleural effusion S/p recent thoracentesis.    Current medicines are reviewed at length with the patient today.  The patient does not have concerns regarding medicines.  The following changes have been made:  no change  Labs/ tests ordered today: See above Assessment and Plan No orders of the defined types were placed in this encounter.     Disposition:   FU with me in 6 months  Signed, Sueanne Margarita, MD  02/02/2015 3:36 PM    [facility] [address], [place], [place]  [address] Phone: [phone]; Fax: [phone]

## 2015-02-02 NOTE — Patient Instructions (Signed)
Medication Instructions:  Your physician recommends that you continue on your current medications as directed. Please refer to the Current Medication list given to you today.   Labwork: Your physician recommends that you return for FASTING lab work. (LFTs, Lipids)  Testing/Procedures: None  Follow-Up: Your physician wants you to follow-up in: 6 months with Dr. Turner. You will receive a reminder letter in the mail two months in advance. If you don't receive a letter, please call our office to schedule the follow-up appointment.   Any Other Special Instructions Will Be Listed Below (If Applicable).     If you need a refill on your cardiac medications before your next appointment, please call your pharmacy.   

## 2015-02-08 ENCOUNTER — Other Ambulatory Visit: Payer: Self-pay | Admitting: Cardiothoracic Surgery

## 2015-02-08 DIAGNOSIS — I25119 Atherosclerotic heart disease of native coronary artery with unspecified angina pectoris: Secondary | ICD-10-CM

## 2015-02-09 ENCOUNTER — Other Ambulatory Visit: Payer: Self-pay | Admitting: *Deleted

## 2015-02-09 ENCOUNTER — Encounter: Payer: Self-pay | Admitting: Cardiothoracic Surgery

## 2015-02-09 ENCOUNTER — Other Ambulatory Visit (INDEPENDENT_AMBULATORY_CARE_PROVIDER_SITE_OTHER): Payer: BC Managed Care – PPO | Admitting: *Deleted

## 2015-02-09 ENCOUNTER — Ambulatory Visit
Admission: RE | Admit: 2015-02-09 | Discharge: 2015-02-09 | Disposition: A | Discharge status: Home / Self Care | Payer: BC Managed Care – PPO | Source: Ambulatory Visit | Attending: Cardiothoracic Surgery | Admitting: Cardiothoracic Surgery

## 2015-02-09 ENCOUNTER — Ambulatory Visit (INDEPENDENT_AMBULATORY_CARE_PROVIDER_SITE_OTHER): Payer: BC Managed Care – PPO | Admitting: Cardiothoracic Surgery

## 2015-02-09 VITALS — BP 113/76 | HR 74 | Resp 20 | Ht 70.0 in | Wt 274.0 lb

## 2015-02-09 DIAGNOSIS — E785 Hyperlipidemia, unspecified: Secondary | ICD-10-CM

## 2015-02-09 DIAGNOSIS — J90 Pleural effusion, not elsewhere classified: Secondary | ICD-10-CM

## 2015-02-09 DIAGNOSIS — M899 Disorder of bone, unspecified: Secondary | ICD-10-CM

## 2015-02-09 DIAGNOSIS — I25119 Atherosclerotic heart disease of native coronary artery with unspecified angina pectoris: Secondary | ICD-10-CM

## 2015-02-09 LAB — HEPATIC FUNCTION PANEL
ALT: 42 U/L (ref 9–46)
AST: 34 U/L (ref 10–35)
Albumin: 3.9 g/dL (ref 3.6–5.1)
Alkaline Phosphatase: 73 U/L (ref 40–115)
Bilirubin, Direct: 0.1 mg/dL (ref <=0.2)
Indirect Bilirubin: 0.4 mg/dL (ref 0.2–1.2)
Total Bilirubin: 0.5 mg/dL (ref 0.2–1.2)
Total Protein: 6.9 g/dL (ref 6.1–8.1)

## 2015-02-09 LAB — LIPID PANEL
Cholesterol: 97 mg/dL — ABNORMAL LOW (ref 125–200)
HDL: 28 mg/dL — ABNORMAL LOW (ref >=40)
LDL Cholesterol: 51 mg/dL (ref <130)
Total CHOL/HDL Ratio: 3.5 Ratio (ref <=5.0)
Triglycerides: 90 mg/dL (ref <150)
VLDL: 18 mg/dL (ref <30)

## 2015-02-09 NOTE — Progress Notes (Signed)
[address]       [place] [address]             [phone]      Daved R Cislo [place] Medical Record #[id number] Date of Birth: 1960-09-11  Referring: Sinda Du, MD Primary Care: Bronson Curb, [place]  Chief Complaint:   POST OP FOLLOW UP 02/01/2014 OPERATIVE REPORT PREOPERATIVE DIAGNOSIS: Recurrent left pleural effusion. POSTOPERATIVE DIAGNOSIS: Recurrent left pleural effusion. Final pathology pending. PROCEDURE PERFORMED: Bronchoscopy, left video-assisted thoracoscopy, drainage of left pleural effusion, pleural biopsies, talc pleurodesis, placement of chest tube and PleurX catheter. SURGEON: Lanelle Bal, MD  History of Present Illness:     Patient doing well following drainage of left pleural effusion intact pleurodesis  He shortness of breath has improved. He was  Evaluated  by rheumatology without any specific diagnosis. Pathology on his pleural fluid and pleural biopsy has been negative for malignancy. 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   in January 2016 he he related that he had been having substernal discomfort was shoveling snow and was referred to cardiology. Initial stress test and echo were unrevealing ultimately the patient was admitted with unstable angina and underwent coronary stenting. Since that time he notes that he's felt better and has had no recurrent chest pain.  The patient denies any symptoms specific to his recent surgery.    Past Medical History  Diagnosis Date  . GERD (gastroesophageal reflux disease)   . Hypertension   . Shortness of breath     Starting May 2015  . Headache     stress and tension  . Neuromuscular disorder (HCC)     restless legs and leg cramps  . Arthritis   . Asthma   . Coronary artery disease 2016    90% RCA and 30% LM S/P PCI of RCA     History    Smoking status  . Former Smoker -- 1.00 packs/day for 30 years  . Types: Cigarettes  . Quit date: 04/08/2000  Smokeless tobacco  . Never Used    History  Alcohol Use  . Yes    Comment: rare     No Known Allergies  Current Outpatient Prescriptions  Medication Sig Dispense Refill  . acetaminophen (TYLENOL) 500 MG tablet Take 1,000 mg by mouth every 6 (six) hours as needed for moderate pain.    Marland Kitchen aspirin EC 81 MG tablet Take 81 mg by mouth daily.    Marland Kitchen atorvastatin (LIPITOR) 40 MG tablet Take 1 tablet (40 mg total) by mouth daily at 6 PM. 30 tablet 0  . losartan (COZAAR) 50 MG tablet Take 50 mg by mouth daily.    . metoprolol tartrate (LOPRESSOR) 25 MG tablet Take 0.5 tablets (12.5 mg total) by mouth 2 (two) times daily. 60 tablet 0  . Multiple Vitamins-Minerals (CENTRUM ADULTS PO) Take 1 tablet by mouth daily.    . nitroGLYCERIN (NITROSTAT) 0.4 MG SL tablet Place 1 tablet (0.4 mg total) under the tongue every 5 (five) minutes as needed for chest pain. 25 tablet 3  . omeprazole (PRILOSEC) 20 MG capsule Take 20 mg by mouth daily.    . polyethylene glycol (MIRALAX / GLYCOLAX) packet Take 17 g by mouth daily as needed.    . ticagrelor (BRILINTA) 90 MG TABS tablet Take 1 tablet (90 mg total) by mouth 2 (two) times  daily. 60 tablet 1   No current facility-administered medications for this visit.       Physical Exam: BP 113/76 mmHg  Pulse 74  Resp 20  Ht 5\' 10"  (1.778 m)  Wt 274 lb (124.286 kg)  BMI 39.32 kg/m2  SpO2 97%  General appearance: alert and cooperative Neurologic: intact Heart: regular rate and rhythm, S1, S2 normal, no murmur, click, rub or gallop Lungs: diminished breath sounds bibasilar Abdomen: soft, non-tender; bowel sounds normal; no masses,  no organomegaly Extremities: extremities normal, atraumatic, no cyanosis or edema and Homans sign is negative, no sign of DVT Wound: the left chest tube sutures And patient's left chest incisions well-healed On the  right lower posterior rib the patient has some point tenderness, he thinks this is an area where he previously had a thoracentesis done  Diagnostic Studies & Laboratory data:     Recent Radiology Findings:   Dg Chest 2 View  02/09/2015  CLINICAL DATA:  Coronary artery disease, former smoker. EXAM: CHEST  2 VIEW COMPARISON:  Portable chest x-ray of September 14, 2014 FINDINGS: The lungs are adequately inflated. There is no focal infiltrate. There is chronic pleural thickening inferior laterally on the left. There is no pleural effusion. The heart and pulmonary vascularity are normal. The perihilar interstitial markings are coarse but stable. The mediastinum is normal in width. The bony thorax exhibits no acute abnormality. IMPRESSION: There is no active cardiopulmonary disease. Electronically Signed   By: David  Martinique M.D.   On: 02/09/2015 11:18   Dg Ribs Unilateral Right  02/09/2015  CLINICAL DATA:  Rib lesion.  Pain. EXAM: RIGHT RIBS - 2 VIEW COMPARISON:  02/09/2015. FINDINGS: No focal soft tissue or bony abnormality identified. No evidence of displaced fracture or focal bony abnormality. Calcifications are noted over the right upper quadrant consistent with gallstones. IMPRESSION: 1. No acute or focal bony abnormality. 2. Gallstones. Electronically Signed   By: Marcello Moores  Register   On: 02/09/2015 12:09   I have independently reviewed the above radiology studies  and reviewed the findings with the patient.    Recent Lab Findings: Lab Results  Component Value Date   WBC 8.9 09/14/2014   HGB 12.2* 09/14/2014   HCT 37.0* 09/14/2014   PLT 293 09/14/2014   GLUCOSE 123* 09/14/2014   CHOL 101 08/04/2014   TRIG 156.0* 08/04/2014   HDL 28.80* 08/04/2014   LDLCALC 41 08/04/2014   ALT 25 08/04/2014   AST 19 08/04/2014   NA 140 09/14/2014   K 3.7 09/14/2014   CL 106 09/14/2014   CREATININE 1.04 09/14/2014   BUN 15 09/14/2014   CO2 24 09/14/2014   TSH 1.410 12/27/2013   INR 1.07 06/06/2014   HGBA1C  6.1* 10/06/2013      Assessment / Plan:    Patient with recurrent left pleural effusion now with drainage and talc pleurodesis of the recurrent left pleural effusion, pathology is negative for malignancy Follow-up chest x-ray is stable without recurrent effusions, rib details show no rib lesions on the right Patient had  rheumatology , was told he had no specific rheumatologic findings or abnormal tests. Since last seen and patient referred to cardiology he has had coronary artery stenting and is on antiplatelet therapy.  Overall the patient appears stable with his x-ray I've not made him a return appointment to see me but would be glad to see him in his or  [facility]'s request.  Grace Isaac MD      [address] [place] [address] Office [phone]   Beeper [phone]  02/09/2015 12:25 PM

## 2015-02-09 NOTE — Addendum Note (Signed)
Addended by: Eulis Foster on: 02/09/2015 12:53 PM   Modules accepted: Orders

## 2015-02-10 ENCOUNTER — Other Ambulatory Visit: Payer: Self-pay | Admitting: *Deleted

## 2015-02-10 MED ORDER — FISH OIL 1000 MG PO CPDR: 4000.0000 mg | DELAYED_RELEASE_CAPSULE | Freq: Every day | ORAL | Status: AC

## 2015-02-14 ENCOUNTER — Encounter: Payer: Self-pay | Admitting: Cardiology

## 2015-02-14 MED ORDER — OMEGA-3-ACID ETHYL ESTERS 1 G PO CAPS: 2.0000 g | ORAL_CAPSULE | Freq: Two times a day (BID) | ORAL | Status: DC

## 2015-02-14 NOTE — Telephone Encounter (Signed)
Patient takes fish oil 4,000 mg daily. He requests Rx fish oil so he can pay for the medication with his FSA card.  To Gay Filler, [facility].

## 2015-07-20 ENCOUNTER — Encounter: Payer: Self-pay | Admitting: Gastroenterology

## 2015-08-11 ENCOUNTER — Encounter: Payer: Self-pay | Admitting: Cardiology

## 2015-08-11 ENCOUNTER — Ambulatory Visit (INDEPENDENT_AMBULATORY_CARE_PROVIDER_SITE_OTHER): Payer: BC Managed Care – PPO | Admitting: Cardiology

## 2015-08-11 VITALS — BP 110/68 | HR 60 | Ht 70.0 in | Wt 284.0 lb

## 2015-08-11 DIAGNOSIS — I2583 Coronary atherosclerosis due to lipid rich plaque: Principal | ICD-10-CM

## 2015-08-11 DIAGNOSIS — I2510 Atherosclerotic heart disease of native coronary artery without angina pectoris: Secondary | ICD-10-CM | POA: Diagnosis not present

## 2015-08-11 DIAGNOSIS — I10 Essential (primary) hypertension: Secondary | ICD-10-CM | POA: Diagnosis not present

## 2015-08-11 DIAGNOSIS — E785 Hyperlipidemia, unspecified: Secondary | ICD-10-CM

## 2015-08-11 MED ORDER — TICAGRELOR 60 MG PO TABS: 60.0000 mg | ORAL_TABLET | Freq: Two times a day (BID) | ORAL | Status: DC

## 2015-08-11 NOTE — Patient Instructions (Signed)
Medication Instructions:  Your physician has recommended you make the following change in your medication:  1. Decrease Brilinta (60 mg ) twice a day, sent in today to patients requested pharmacy   Labwork:  Your physician recommends that you return for a FASTING lipid profile/lft/bmet; May 12 between 8-5   Testing/Procedures: -None  Follow-Up: Your physician wants you to follow-up in: 6 months with Dr. Radford Pax.  You will receive a reminder letter in the mail two months in advance. If you don't receive a letter, please call our office to schedule the follow-up appointment.   Any Other Special Instructions Will Be Listed Below (If Applicable).     If you need a refill on your cardiac medications before your next appointment, please call your pharmacy.

## 2015-08-11 NOTE — Progress Notes (Signed)
Cardiology Office Note    Date:  08/13/2015   ID:  BEAR GALA, DOB 11/18/1960, MRN [id number]  PCP:  Bronson Curb, PA-C  Cardiologist:  Sueanne Margarita, MD   Chief Complaint  Patient presents with  . Coronary Artery Disease  . Hypertension  . Hyperlipidemia    History of Present Illness:  Colin Lee is a 55 y.o. male with a hx of recurrent left pleural effusion and has had drainage and talc pleurodesis with Dr. [name]. He has a history of ASCAD with cath showing high grade proximal RCA stenosis that was treated with DES x 2. He had recurrent CP post PCI. Re-look cath demonstrated patent stents in the RCA. He has a history of moderate pleural effusion and community acquired pneumonia. He underwent thoracocentesis and was found to have bloody effusion with fluid analysis showing lymphocytes. ID and pulmonary consulted and suggested this could be rheumatologic in origin and rheumatology work up ordered.   He returns for FU. He is feeling good. Denies further chest pain. He has chronic SOB that has improved and only comes on with extreme exertion. NoLE edema, palpitations or syncope. He has been having problems with dizziness with movement of his body. He denies any lightheadedness or feeling that he is going to pass out. He walks 15 minutes daily.     Past Medical History  Diagnosis Date  . GERD (gastroesophageal reflux disease)   . Hypertension   . Shortness of breath     Starting May 2015  . Headache     stress and tension  . Neuromuscular disorder (HCC)     restless legs and leg cramps  . Arthritis   . Asthma   . Coronary artery disease 2016    90% RCA and 30% LM S/P PCI of RCA    Past Surgical History  Procedure Laterality Date  . No prior surgery    . Thoracentesis Left 2015  . Colonoscopy w/ biopsies and polypectomy  2014    benign  . Video assisted thoracoscopy Left 02/01/2014    Procedure: VIDEO ASSISTED THORACOSCOPY;   Surgeon: Grace Isaac, MD;  Location: [place];  Service: Thoracic;  Laterality: Left;  . Pleural effusion drainage Left 02/01/2014    Procedure: DRAINAGE OF PLEURAL EFFUSION;  Surgeon: Grace Isaac, MD;  Location: [place];  Service: Thoracic;  Laterality: Left;  . Video bronchoscopy N/A 02/01/2014    Procedure: VIDEO BRONCHOSCOPY;  Surgeon: Grace Isaac, MD;  Location: [place];  Service: Thoracic;  Laterality: N/A;  . Pleural biopsy Left 02/01/2014    Procedure: PLEURAL BIOPSY;  Surgeon: Grace Isaac, MD;  Location: [place];  Service: Thoracic;  Laterality: Left;  . Chest tube insertion Left 02/01/2014    Procedure: INSERTION PLEURAL DRAINAGE CATHETER;  Surgeon: Grace Isaac, MD;  Location: [place];  Service: Thoracic;  Laterality: Left;  . Talc pleurodesis Left 02/01/2014    Procedure: TALC PLEURADESIS;  Surgeon: Grace Isaac, MD;  Location: [place];  Service: Thoracic;  Laterality: Left;  . Removal of pleural drainage catheter Left 02/16/2014    Procedure: REMOVAL OF PLEURAL DRAINAGE CATHETER;  Surgeon: Grace Isaac, MD;  Location: [place];  Service: Thoracic;  Laterality: Left;  . Left heart catheterization with coronary angiogram N/A 06/07/2014    Procedure: LEFT HEART CATHETERIZATION WITH CORONARY ANGIOGRAM;  Surgeon: Peter M Martinique, MD;  Location: [facility] CATH LAB;  Service: Cardiovascular;  Laterality: N/A;  . Left heart catheterization  with coronary angiogram N/A 06/08/2014    Procedure: LEFT HEART CATHETERIZATION WITH CORONARY ANGIOGRAM;  Surgeon: Sinclair Grooms, MD;  Location: [facility] CATH LAB;  Service: Cardiovascular;  Laterality: N/A;    Current Medications: Outpatient Prescriptions Prior to Visit  Medication Sig Dispense Refill  . acetaminophen (TYLENOL) 500 MG tablet Take 1,000 mg by mouth every 6 (six) hours as needed for moderate pain.    Marland Kitchen aspirin EC 81 MG tablet Take 81 mg by mouth daily.    Marland Kitchen atorvastatin (LIPITOR) 40 MG tablet Take 1 tablet (40 mg total) by  mouth daily at 6 PM. 30 tablet 0  . losartan (COZAAR) 50 MG tablet Take 50 mg by mouth daily.    . metoprolol tartrate (LOPRESSOR) 25 MG tablet Take 0.5 tablets (12.5 mg total) by mouth 2 (two) times daily. 60 tablet 0  . Multiple Vitamins-Minerals (CENTRUM ADULTS PO) Take 1 tablet by mouth daily.    . nitroGLYCERIN (NITROSTAT) 0.4 MG SL tablet Place 1 tablet (0.4 mg total) under the tongue every 5 (five) minutes as needed for chest pain. 25 tablet 3  . Omega-3 Fatty Acids (FISH OIL) 1000 MG CPDR Take 4,000 mg by mouth daily.    Marland Kitchen omeprazole (PRILOSEC) 20 MG capsule Take 20 mg by mouth daily.    . polyethylene glycol (MIRALAX / GLYCOLAX) packet Take 17 g by mouth daily as needed.    . ticagrelor (BRILINTA) 90 MG TABS tablet Take 1 tablet (90 mg total) by mouth 2 (two) times daily. 60 tablet 1  . omega-3 acid ethyl esters (LOVAZA) 1 G capsule Take 2 capsules (2 g total) by mouth 2 (two) times daily. 120 capsule 6   No facility-administered medications prior to visit.     Allergies:   Review of patient's allergies indicates no known allergies.   Social History   Social History  . Marital Status: Married    Spouse Name: N/A  . Number of Children: N/A  . Years of Education: N/A   Social History Main Topics  . Smoking status: Former Smoker -- 1.00 packs/day for 30 years    Types: Cigarettes    Quit date: 04/08/2000  . Smokeless tobacco: Never Used  . Alcohol Use: Yes     Comment: rare  . Drug Use: No  . Sexual Activity: Not Asked   Other Topics Concern  . None   Social History Narrative     Family History:  The patient's He was adopted. Family history is unknown by patient.   ROS:   Please see the history of present illness.    Review of Systems  Musculoskeletal: Positive for muscle cramps.   All other systems reviewed and are negative.   PHYSICAL EXAM:   VS:  BP 110/68 mmHg  Pulse 60  Ht 5\' 10"  (1.778 m)  Wt 284 lb (128.822 kg)  BMI 40.75 kg/m2   GEN: Well  nourished, well developed, in no acute distress HEENT: normal Neck: no JVD, carotid bruits, or masses Cardiac: RRR; no murmurs, rubs, or gallops,no edema.  Intact distal pulses bilaterally.  Respiratory:  clear to auscultation bilaterally, normal work of breathing GI: soft, nontender, nondistended, + BS MS: no deformity or atrophy Skin: warm and dry, no rash Neuro:  Alert and Oriented x 3, Strength and sensation are intact Psych: euthymic mood, full affect  Wt Readings from Last 3 Encounters:  08/11/15 284 lb (128.822 kg)  02/09/15 274 lb (124.286 kg)  02/02/15 274 lb (124.286 kg)  Studies/Labs Reviewed:   EKG:  EKG is  ordered today NSR at 71bpm with no ST changes  Recent Labs: 09/14/2014: BUN 15; Creatinine, Ser 1.04; Hemoglobin 12.2*; Platelets 293; Potassium 3.7; Sodium 140 02/09/2015: ALT 42   Lipid Panel    Component Value Date/Time   CHOL 97* 02/09/2015 1253   TRIG 90 02/09/2015 1253   HDL 28* 02/09/2015 1253   CHOLHDL 3.5 02/09/2015 1253   VLDL 18 02/09/2015 1253   LDLCALC 51 02/09/2015 1253    Additional studies/ records that were reviewed today include:  none    ASSESSMENT:    1. Coronary artery disease due to lipid rich plaque   2. Essential hypertension   3. Dyslipidemia      PLAN:  In order of problems listed above:  1. ASCAD - no angina - continue ASA/Brilinta/ASA/statin.  Decrease Brilinta to 60mg  BID. His stents were done 06/2014 2. HTN - controlled on ARB/BB 3. Dyslipidemia - LDL goal < 70 - at goal 02/2015.  Repeat FLP and ALT.    Medication Adjustments/Labs and Tests Ordered: Current medicines are reviewed at length with the patient today.  Concerns regarding medicines are outlined above.  Medication changes, Labs and Tests ordered today are listed in the Patient Instructions below.  Patient Instructions  Medication Instructions:  Your physician has recommended you make the following change in your medication:  1. Decrease Brilinta  (60 mg ) twice a day, sent in today to patients requested pharmacy   Labwork:  Your physician recommends that you return for a FASTING lipid profile/lft/bmet; May 12 between 8-5   Testing/Procedures: -None  Follow-Up: Your physician wants you to follow-up in: 6 months with Dr. Radford Pax.  You will receive a reminder letter in the mail two months in advance. If you don't receive a letter, please call our office to schedule the follow-up appointment.   Any Other Special Instructions Will Be Listed Below (If Applicable).     If you need a refill on your cardiac medications before your next appointment, please call your pharmacy.       Lurena Nida, MD  08/13/2015 3:32 PM    [facility] [place], [place], [place]  [address] Phone: [phone]; Fax: [phone]

## 2015-08-18 ENCOUNTER — Other Ambulatory Visit: Payer: BC Managed Care – PPO

## 2016-02-19 ENCOUNTER — Telehealth: Payer: Self-pay | Admitting: Gastroenterology

## 2016-02-19 NOTE — Telephone Encounter (Signed)
Patient notified of the information.  He verbalized understanding of recall dates

## 2016-02-19 NOTE — Telephone Encounter (Signed)
No answer/machine.  Per procedure report from 03/30/13 if patient had 3 or more adenomatous polyps patient will need recall in 3 years, if not will need recall in 5 years.  Per the letter from 04/08/2013 patient had 2 adenomatous polyps and recall date was for 2019.    No answer/machine.  I will try and reach the patient later

## 2016-02-22 ENCOUNTER — Encounter: Payer: Self-pay | Admitting: Cardiology

## 2016-03-05 ENCOUNTER — Encounter: Payer: Self-pay | Admitting: Cardiology

## 2016-03-12 ENCOUNTER — Ambulatory Visit: Payer: BC Managed Care – PPO | Admitting: Cardiology

## 2016-04-15 ENCOUNTER — Encounter: Payer: Self-pay | Admitting: Cardiology

## 2016-04-15 ENCOUNTER — Ambulatory Visit: Payer: BC Managed Care – PPO | Admitting: Cardiology

## 2016-04-15 ENCOUNTER — Ambulatory Visit (INDEPENDENT_AMBULATORY_CARE_PROVIDER_SITE_OTHER): Payer: BC Managed Care – PPO | Admitting: Cardiology

## 2016-04-15 VITALS — BP 139/74 | HR 79 | Ht 70.0 in | Wt 292.8 lb

## 2016-04-15 DIAGNOSIS — I2510 Atherosclerotic heart disease of native coronary artery without angina pectoris: Secondary | ICD-10-CM

## 2016-04-15 DIAGNOSIS — I10 Essential (primary) hypertension: Secondary | ICD-10-CM | POA: Diagnosis not present

## 2016-04-15 DIAGNOSIS — E785 Hyperlipidemia, unspecified: Secondary | ICD-10-CM

## 2016-04-15 DIAGNOSIS — I739 Peripheral vascular disease, unspecified: Secondary | ICD-10-CM | POA: Diagnosis not present

## 2016-04-15 HISTORY — DX: Peripheral vascular disease, unspecified: I73.9

## 2016-04-15 MED ORDER — CLOPIDOGREL BISULFATE 75 MG PO TABS: 75.0000 mg | ORAL_TABLET | Freq: Every day | ORAL | 11 refills | Status: DC

## 2016-04-15 MED ORDER — PANTOPRAZOLE SODIUM 40 MG PO TBEC: 40.0000 mg | DELAYED_RELEASE_TABLET | Freq: Every day | ORAL | 11 refills | Status: DC

## 2016-04-15 MED ORDER — NITROGLYCERIN 0.4 MG SL SUBL: 0.4000 mg | SUBLINGUAL_TABLET | SUBLINGUAL | 3 refills | Status: DC | PRN

## 2016-04-15 NOTE — Patient Instructions (Addendum)
Medication Instructions:  1) STOP BRILINTA 2) STOP PRILOSEC 3) START PLAVIX 75 mg daily 4) START PROTONIX 40 mg daily   Labwork: Your physician recommends that you return for FASTING lab work.  Testing/Procedures: Your physician has requested that you have a lower extremity arterial duplex. During this test, ultrasound is used to evaluate arterial blood flow in the legs. Allow one hour for this exam. There are no restrictions or special instructions.   Follow-Up: Your physician wants you to follow-up in: 6 months with Dr. Radford Pax. You will receive a reminder letter in the mail two months in advance. If you don't receive a letter, please call our office to schedule the follow-up appointment.   Any Other Special Instructions Will Be Listed Below (If Applicable).     If you need a refill on your cardiac medications before your next appointment, please call your pharmacy.

## 2016-04-15 NOTE — Progress Notes (Signed)
Cardiology Office Note    Date:  04/15/2016   ID:  Colin Lee, DOB 11/12/1960, MRN [id number]  PCP:  Bronson Curb, PA-C  Cardiologist:  Fransico Him, MD   Chief Complaint  Patient presents with  . Coronary Artery Disease  . Hypertension  . Hyperlipidemia    History of Present Illness:  Colin Lee is a 56 y.o. male with a hx of recurrent left pleural effusion and has had drainage and talc pleurodesis with Dr. [name]. He has a history of ASCAD with cath showing high grade proximal RCA stenosis that was treated with DES x 2. He had recurrent CP post PCI. Re-look cath demonstrated patent stents in the RCA. He has a history of moderate pleural effusion and community acquired pneumonia. He underwent thoracocentesis and was found to have bloody effusion with fluid analysis showing lymphocytes. ID and pulmonary consulted and suggested this could be rheumatologic in origin and rheumatology work up ordered and was negative.  No malignancy noted.   He returns for FU. He is feeling good. Denies further chest pain. He has chronic SOB that has improved and only comes on with extreme exertion. NoLE edema, palpitations or syncope. He has been having problems with dizziness with movement of his body. He denies any lightheadedness or feeling that he is going to pass out. He has been having a lot of cramps in his thighs and calfs when he works.     Past Medical History:  Diagnosis Date  . Arthritis   . Asthma   . Coronary artery disease 2016   90% RCA and 30% LM S/P PCI of RCA  . GERD (gastroesophageal reflux disease)   . Headache    stress and tension  . Hypertension   . Neuromuscular disorder (HCC)    restless legs and leg cramps  . Shortness of breath    Starting May 2015    Past Surgical History:  Procedure Laterality Date  . CHEST TUBE INSERTION Left 02/01/2014   Procedure: INSERTION PLEURAL DRAINAGE CATHETER;  Surgeon: Grace Isaac, MD;   Location: [place];  Service: Thoracic;  Laterality: Left;  . COLONOSCOPY W/ BIOPSIES AND POLYPECTOMY  2014   benign  . LEFT HEART CATHETERIZATION WITH CORONARY ANGIOGRAM N/A 06/07/2014   Procedure: LEFT HEART CATHETERIZATION WITH CORONARY ANGIOGRAM;  Surgeon: Peter M Martinique, MD;  Location[facility] CATH LAB;  Service: Cardiovascular;  Laterality: N/A;  . LEFT HEART CATHETERIZATION WITH CORONARY ANGIOGRAM N/A 06/08/2014   Procedure: LEFT HEART CATHETERIZATION WITH CORONARY ANGIOGRAM;  Surgeon: Sinclair Grooms, MD;  Location: [facility] CATH LAB;  Service: Cardiovascular;  Laterality: N/A;  . no prior surgery    . PLEURAL BIOPSY Left 02/01/2014   Procedure: PLEURAL BIOPSY;  Surgeon: Grace Isaac, MD;  Location: [place];  Service: Thoracic;  Laterality: Left;  . PLEURAL EFFUSION DRAINAGE Left 02/01/2014   Procedure: DRAINAGE OF PLEURAL EFFUSION;  Surgeon: Grace Isaac, MD;  Location: [place];  Service: Thoracic;  Laterality: Left;  . REMOVAL OF PLEURAL DRAINAGE CATHETER Left 02/16/2014   Procedure: REMOVAL OF PLEURAL DRAINAGE CATHETER;  Surgeon: Grace Isaac, MD;  Location: [place];  Service: Thoracic;  Laterality: Left;  . TALC PLEURODESIS Left 02/01/2014   Procedure: Pietro Cassis;  Surgeon: Grace Isaac, MD;  Location: [place];  Service: Thoracic;  Laterality: Left;  . THORACENTESIS Left 2015  . VIDEO ASSISTED THORACOSCOPY Left 02/01/2014   Procedure: VIDEO ASSISTED THORACOSCOPY;  Surgeon: Grace Isaac, MD;  Location: [facility] OR;  Service: Thoracic;  Laterality: Left;  Marland Kitchen VIDEO BRONCHOSCOPY N/A 02/01/2014   Procedure: VIDEO BRONCHOSCOPY;  Surgeon: Grace Isaac, MD;  Location: [facility] OR;  Service: Thoracic;  Laterality: N/A;    Current Medications: Outpatient Medications Prior to Visit  Medication Sig Dispense Refill  . acetaminophen (TYLENOL) 500 MG tablet Take 1,000 mg by mouth every 6 (six) hours as needed for moderate pain.    Marland Kitchen aspirin EC 81 MG tablet Take 81 mg by mouth daily.    Marland Kitchen  atorvastatin (LIPITOR) 40 MG tablet Take 1 tablet (40 mg total) by mouth daily at 6 PM. 30 tablet 0  . losartan (COZAAR) 50 MG tablet Take 50 mg by mouth daily.    . metoprolol tartrate (LOPRESSOR) 25 MG tablet Take 0.5 tablets (12.5 mg total) by mouth 2 (two) times daily. (Patient taking differently: Take 12.5 mg by mouth daily. ) 60 tablet 0  . Multiple Vitamins-Minerals (CENTRUM ADULTS PO) Take 1 tablet by mouth daily.    . nitroGLYCERIN (NITROSTAT) 0.4 MG SL tablet Place 1 tablet (0.4 mg total) under the tongue every 5 (five) minutes as needed for chest pain. 25 tablet 3  . Omega-3 Fatty Acids (FISH OIL) 1000 MG CPDR Take 4,000 mg by mouth daily.    Marland Kitchen omeprazole (PRILOSEC) 20 MG capsule Take 20 mg by mouth daily.    . polyethylene glycol (MIRALAX / GLYCOLAX) packet Take 17 g by mouth daily as needed.    . ticagrelor (BRILINTA) 60 MG TABS tablet Take 1 tablet (60 mg total) by mouth 2 (two) times daily. (Patient taking differently: Take 60 mg by mouth daily. ) 60 tablet 9   No facility-administered medications prior to visit.      Allergies:   Patient has no known allergies.   Social History   Social History  . Marital status: Married    Spouse name: N/A  . Number of children: N/A  . Years of education: N/A   Social History Main Topics  . Smoking status: Former Smoker    Packs/day: 1.00    Years: 30.00    Types: Cigarettes    Quit date: 04/08/2000  . Smokeless tobacco: Never Used  . Alcohol use Yes     Comment: rare  . Drug use: No  . Sexual activity: Not Asked   Other Topics Concern  . None   Social History Narrative  . None     Family History:  The patient's He was adopted. Family history is unknown by patient.   ROS:   Please see the history of present illness.    ROS All other systems reviewed and are negative.  No flowsheet data found.     PHYSICAL EXAM:   VS:  BP 139/74   Pulse 79   Ht 5\' 10"  (1.778 m)   Wt 292 lb 12.8 oz (132.8 kg)   BMI 42.01 kg/m      GEN: Well nourished, well developed, in no acute distress  HEENT: normal  Neck: no JVD, carotid bruits, or masses Cardiac: RRR; no murmurs, rubs, or gallops,no edema.  Intact distal pulses bilaterally.  Respiratory:  clear to auscultation bilaterally, normal work of breathing GI: soft, nontender, nondistended, + BS MS: no deformity or atrophy  Skin: warm and dry, no rash Neuro:  Alert and Oriented x 3, Strength and sensation are intact Psych: euthymic mood, full affect  Wt Readings from Last 3 Encounters:  04/15/16 292 lb 12.8 oz (132.8 kg)  08/11/15 284 lb (128.8 kg)  02/09/15 274 lb (124.3 kg)      Studies/Labs Reviewed:   EKG:  EKG is not ordered today.    Recent Labs: No results found for requested labs within last 8760 hours.   Lipid Panel    Component Value Date/Time   CHOL 97 (L) 02/09/2015 1253   TRIG 90 02/09/2015 1253   HDL 28 (L) 02/09/2015 1253   CHOLHDL 3.5 02/09/2015 1253   VLDL 18 02/09/2015 1253   LDLCALC 51 02/09/2015 1253    Additional studies/ records that were reviewed today include:  none    ASSESSMENT:    1. Coronary artery disease involving native coronary artery of native heart without angina pectoris   2. Essential hypertension   3. Dyslipidemia   4. Claudication [facility])      PLAN:  In order of problems listed above:  1. ASCAD - s/p DES to RCA x 2.  Continue ASSA/statin and BB.  His PCI was 2016 so I will switch him from Brilinta to Plavix 75mg  daily.  I will refill his SL NTG today 2.   HTN - BP controlled on current meds.  Continue ARB and BB. Check BMET. 3.   Hyperlipidemia - LDL goal < 70.  Continue statin. Check FLP and ALT. 4.   Claudication - check LE arterial dopplers    Medication Adjustments/Labs and Tests Ordered: Current medicines are reviewed at length with the patient today.  Concerns regarding medicines are outlined above.  Medication changes, Labs and Tests ordered today are listed in the Patient Instructions  below.  There are no Patient Instructions on file for this visit.   Signed, Fransico Him, MD  04/15/2016 1:43 PM    [facility] [place], [place], [place]  [address] Phone: [phone]; Fax: [phone]

## 2016-04-22 ENCOUNTER — Other Ambulatory Visit: Payer: BC Managed Care – PPO

## 2016-05-01 ENCOUNTER — Ambulatory Visit: Payer: BC Managed Care – PPO | Admitting: Cardiology

## 2016-07-09 ENCOUNTER — Other Ambulatory Visit: Payer: Self-pay | Admitting: Cardiology

## 2016-07-09 DIAGNOSIS — I739 Peripheral vascular disease, unspecified: Secondary | ICD-10-CM

## 2016-07-12 ENCOUNTER — Ambulatory Visit (HOSPITAL_COMMUNITY)
Admission: RE | Admit: 2016-07-12 | Discharge: 2016-07-12 | Disposition: A | Discharge status: Home / Self Care | Payer: BC Managed Care – PPO | Source: Ambulatory Visit | Attending: Cardiology | Admitting: Cardiology

## 2016-07-12 DIAGNOSIS — I2510 Atherosclerotic heart disease of native coronary artery without angina pectoris: Secondary | ICD-10-CM | POA: Diagnosis not present

## 2016-07-12 DIAGNOSIS — I10 Essential (primary) hypertension: Secondary | ICD-10-CM | POA: Diagnosis not present

## 2016-07-12 DIAGNOSIS — Z87891 Personal history of nicotine dependence: Secondary | ICD-10-CM | POA: Diagnosis not present

## 2016-07-12 DIAGNOSIS — I739 Peripheral vascular disease, unspecified: Secondary | ICD-10-CM

## 2017-03-25 ENCOUNTER — Ambulatory Visit: Payer: BC Managed Care – PPO | Admitting: Urology

## 2017-03-25 DIAGNOSIS — N5201 Erectile dysfunction due to arterial insufficiency: Secondary | ICD-10-CM

## 2017-03-25 DIAGNOSIS — R109 Unspecified abdominal pain: Secondary | ICD-10-CM | POA: Diagnosis not present

## 2017-03-25 DIAGNOSIS — R350 Frequency of micturition: Secondary | ICD-10-CM | POA: Diagnosis not present

## 2017-03-25 DIAGNOSIS — R309 Painful micturition, unspecified: Secondary | ICD-10-CM

## 2017-04-22 ENCOUNTER — Encounter: Payer: Self-pay | Admitting: Physician Assistant

## 2017-04-22 ENCOUNTER — Ambulatory Visit (INDEPENDENT_AMBULATORY_CARE_PROVIDER_SITE_OTHER): Payer: BC Managed Care – PPO | Admitting: Physician Assistant

## 2017-04-22 VITALS — BP 138/70 | HR 64 | Ht 70.0 in | Wt 291.8 lb

## 2017-04-22 DIAGNOSIS — I25118 Atherosclerotic heart disease of native coronary artery with other forms of angina pectoris: Secondary | ICD-10-CM | POA: Diagnosis not present

## 2017-04-22 DIAGNOSIS — R079 Chest pain, unspecified: Secondary | ICD-10-CM

## 2017-04-22 DIAGNOSIS — I10 Essential (primary) hypertension: Secondary | ICD-10-CM | POA: Diagnosis not present

## 2017-04-22 DIAGNOSIS — E785 Hyperlipidemia, unspecified: Secondary | ICD-10-CM | POA: Diagnosis not present

## 2017-04-22 MED ORDER — LOSARTAN POTASSIUM 100 MG PO TABS: 100.0000 mg | ORAL_TABLET | Freq: Every day | ORAL | 11 refills | Status: DC

## 2017-04-22 NOTE — Patient Instructions (Signed)
Medication Instructions:  Your physician has recommended you make the following change in your medication:  1. Increase Losartan (100 mg ) daily , sent in today to patient's requested pharmacy.    Labwork: Your physician recommends that you return for lab work on the day of stress test/bmet   Testing/Procedures: Your physician has requested that you have en exercise stress myoview. For further information please visit [email or web address]. Please follow instruction sheet, as given.    Follow-Up: Your physician recommends that you keep your scheduled  follow-up appointment with Dr. Radford Pax.    Any Other Special Instructions Will Be Listed Below (If Applicable). Two Gram Sodium Diet 2000 mg  What is Sodium? Sodium is a mineral found naturally in many foods. The most significant source of sodium in the diet is table salt, which is about 40% sodium.  Processed, convenience, and preserved foods also contain a large amount of sodium.  The body needs only 500 mg of sodium daily to function,  A normal diet provides more than enough sodium even if you do not use salt.  Why Limit Sodium? A build up of sodium in the body can cause thirst, increased blood pressure, shortness of breath, and water retention.  Decreasing sodium in the diet can reduce edema and risk of heart attack or stroke associated with high blood pressure.  Keep in mind that there are many other factors involved in these health problems.  Heredity, obesity, lack of exercise, cigarette smoking, stress and what you eat all play a role.  General Guidelines:  Do not add salt at the table or in cooking.  One teaspoon of salt contains over 2 grams of sodium.  Read food labels  Avoid processed and convenience foods  Ask your dietitian before eating any foods not dicussed in the menu planning guidelines  Consult your physician if you wish to use a salt substitute or a sodium containing medication such as antacids.  Limit milk and milk  products to 16 oz (2 cups) per day.  Shopping Hints:  READ LABELS!! "Dietetic" does not necessarily mean low sodium.  Salt and other sodium ingredients are often added to foods during processing.   Menu Planning Guidelines Food Group Choose More Often Avoid  Beverages (see also the milk group All fruit juices, low-sodium, salt-free vegetables juices, low-sodium carbonated beverages Regular vegetable or tomato juices, commercially softened water used for drinking or cooking  Breads and Cereals Enriched white, wheat, rye and pumpernickel bread, hard rolls and dinner rolls; muffins, cornbread and waffles; most dry cereals, cooked cereal without added salt; unsalted crackers and breadsticks; low sodium or homemade bread crumbs Bread, rolls and crackers with salted tops; quick breads; instant hot cereals; pancakes; commercial bread stuffing; self-rising flower and biscuit mixes; regular bread crumbs or cracker crumbs  Desserts and Sweets Desserts and sweets mad with mild should be within allowance Instant pudding mixes and cake mixes  Fats Butter or margarine; vegetable oils; unsalted salad dressings, regular salad dressings limited to 1 Tbs; light, sour and heavy cream Regular salad dressings containing bacon fat, bacon bits, and salt pork; snack dips made with instant soup mixes or processed cheese; salted nuts  Fruits Most fresh, frozen and canned fruits Fruits processed with salt or sodium-containing ingredient (some dried fruits are processed with sodium sulfites        Vegetables Fresh, frozen vegetables and low- sodium canned vegetables Regular canned vegetables, sauerkraut, pickled vegetables, and others prepared in brine; frozen vegetables in sauces; vegetables seasoned with ham,  bacon or salt pork  Condiments, Sauces, Miscellaneous  Salt substitute with physician's approval; pepper, herbs, spices; vinegar, lemon or lime juice; hot pepper sauce; garlic powder, onion powder, low sodium soy  sauce (1 Tbs.); low sodium condiments (ketchup, chili sauce, mustard) in limited amounts (1 tsp.) fresh ground horseradish; unsalted tortilla chips, pretzels, potato chips, popcorn, salsa (1/4 cup) Any seasoning made with salt including garlic salt, celery salt, onion salt, and seasoned salt; sea salt, rock salt, kosher salt; meat tenderizers; monosodium glutamate; mustard, regular soy sauce, barbecue, sauce, chili sauce, teriyaki sauce, steak sauce, Worcestershire sauce, and most flavored vinegars; canned gravy and mixes; regular condiments; salted snack foods, olives, picles, relish, horseradish sauce, catsup   Food preparation: Try these seasonings Meats:    Pork Sage, onion Serve with applesauce  Chicken Poultry seasoning, thyme, parsley Serve with cranberry sauce  Lamb Curry powder, rosemary, garlic, thyme Serve with mint sauce or jelly  Veal Marjoram, basil Serve with current jelly, cranberry sauce  Beef Pepper, bay leaf Serve with dry mustard, unsalted chive butter  Fish Bay leaf, dill Serve with unsalted lemon butter, unsalted parsley butter  Vegetables:    Asparagus Lemon juice   Broccoli Lemon juice   Carrots Mustard dressing parsley, mint, nutmeg, glazed with unsalted butter and sugar   Green beans Marjoram, lemon juice, nutmeg,dill seed   Tomatoes Basil, marjoram, onion   Spice /blend for [employer]" 4 tsp ground thyme 1 tsp ground sage 3 tsp ground rosemary 4 tsp ground marjoram   Test your knowledge 1. A product that says "Salt Free" may still contain sodium. True or False 2. Garlic Powder and Hot Pepper Sauce an be used as alternative seasonings.True or False 3. Processed foods have more sodium than fresh foods.  True or False 4. Canned Vegetables have less sodium than froze True or False  WAYS TO DECREASE YOUR SODIUM INTAKE 1. Avoid the use of added salt in cooking and at the table.  Table salt (and other prepared seasonings which contain salt) is probably one of the  greatest sources of sodium in the diet.  Unsalted foods can gain flavor from the sweet, sour, and butter taste sensations of herbs and spices.  Instead of using salt for seasoning, try the following seasonings with the foods listed.  Remember: how you use them to enhance natural food flavors is limited only by your creativity... Allspice-Meat, fish, eggs, fruit, peas, red and yellow vegetables Almond Extract-Fruit baked goods Anise Seed-Sweet breads, fruit, carrots, beets, cottage cheese, cookies (tastes like licorice) Basil-Meat, fish, eggs, vegetables, rice, vegetables salads, soups, sauces Bay Leaf-Meat, fish, stews, poultry Burnet-Salad, vegetables (cucumber-like flavor) Caraway Seed-Bread, cookies, cottage cheese, meat, vegetables, cheese, rice Cardamon-Baked goods, fruit, soups Celery Powder or seed-Salads, salad dressings, sauces, meatloaf, soup, bread.Do not use  celery salt Chervil-Meats, salads, fish, eggs, vegetables, cottage cheese (parsley-like flavor) Chili Power-Meatloaf, chicken cheese, corn, eggplant, egg dishes Chives-Salads cottage cheese, egg dishes, soups, vegetables, sauces Cilantro-Salsa, casseroles Cinnamon-Baked goods, fruit, pork, lamb, chicken, carrots Cloves-Fruit, baked goods, fish, pot roast, green beans, beets, carrots Coriander-Pastry, cookies, meat, salads, cheese (lemon-orange flavor) Cumin-Meatloaf, fish,cheese, eggs, cabbage,fruit pie (caraway flavor) [employer], fruit, eggs, fish, poultry, cottage cheese, vegetables Dill Seed-Meat, cottage cheese, poultry, vegetables, fish, salads, bread Fennel Seed-Bread, cookies, apples, pork, eggs, fish, beets, cabbage, cheese, Licorice-like flavor Garlic-(buds or powder) Salads, meat, poultry, fish, bread, butter, vegetables, potatoes.Do not  use garlic salt Ginger-Fruit, vegetables, baked goods, meat, fish, poultry Horseradish Root-Meet, vegetables, butter Lemon Juice or Extract-Vegetables,  fruit, tea, baked  goods, fish salads Mace-Baked goods fruit, vegetables, fish, poultry (taste like nutmeg) Maple Extract-Syrups Marjoram-Meat, chicken, fish, vegetables, breads, green salads (taste like Sage) Mint-Tea, lamb, sherbet, vegetables, desserts, carrots, cabbage Mustard, Dry or Seed-Cheese, eggs, meats, vegetables, poultry Nutmeg-Baked goods, fruit, chicken, eggs, vegetables, desserts Onion Powder-Meat, fish, poultry, vegetables, cheese, eggs, bread, rice salads (Do not use   Onion salt) Orange Extract-Desserts, baked goods Oregano-Pasta, eggs, cheese, onions, pork, lamb, fish, chicken, vegetables, green salads Paprika-Meat, fish, poultry, eggs, cheese, vegetables Parsley Flakes-Butter, vegetables, meat fish, poultry, eggs, bread, salads (certain forms may   Contain sodium Pepper-Meat fish, poultry, vegetables, eggs Peppermint Extract-Desserts, baked goods Poppy Seed-Eggs, bread, cheese, fruit dressings, baked goods, noodles, vegetables, cottage  [employer], poultry, meat, fish, cauliflower, turnips,eggs bread Saffron-Rice, bread, veal, chicken, fish, eggs Sage-Meat, fish, poultry, onions, eggplant, tomateos, pork, stews Savory-Eggs, salads, poultry, meat, rice, vegetables, soups, pork Tarragon-Meat, poultry, fish, eggs, butter, vegetables (licorice-like flavor)  Thyme-Meat, poultry, fish, eggs, vegetables, (clover-like flavor), sauces, soups Tumeric-Salads, butter, eggs, fish, rice, vegetables (saffron-like flavor) Vanilla Extract-Baked goods, candy Vinegar-Salads, vegetables, meat marinades Walnut Extract-baked goods, candy  2. Choose your Foods Wisely   The following is a list of foods to avoid which are high in sodium:  Meats-Avoid all smoked, canned, salt cured, dried and kosher meat and fish as well as Anchovies   [employer] meats:Bologna, Liverwurst, Pastrami Canned meat or fish  Marinated herring Caviar    Pepperoni Corned  Beef   Pizza Dried chipped beef  Salami Frozen breaded fish or meat Salt pork Frankfurters or hot dogs  Sardines Gefilte fish   Sausage Ham (boiled ham, Proscuitto Smoked butt    spiced ham)   Spam      TV Dinners Vegetables Canned vegetables (Regular) Relish Canned mushrooms  Sauerkraut Olives    Tomato juice Pickles  Bakery and Dessert Products Canned puddings  Cream pies Cheesecake   Decorated cakes Cookies  Beverages/Juices Tomato juice, regular  Gatorade   V-8 vegetable juice, regular  Breads and Cereals Biscuit mixes   Salted potato chips, corn chips, pretzels Bread stuffing mixes  Salted crackers and rolls Pancake and waffle mixes Self-rising flour  Seasonings Accent    Meat sauces Barbecue sauce  Meat tenderizer Catsup    Monosodium glutamate (MSG) Celery salt   Onion salt Chili sauce   Prepared mustard Garlic salt   Salt, seasoned salt, sea salt Gravy mixes   Soy sauce Horseradish   Steak sauce Ketchup   Tartar sauce Lite salt    Teriyaki sauce Marinade mixes   Worcestershire sauce  Others Baking powder   Cocoa and cocoa mixes Baking soda   Commercial casserole mixes Candy-caramels, chocolate  Dehydrated soups    Bars, fudge,nougats  Instant rice and pasta mixes Canned broth or soup  Maraschino cherries Cheese, aged and processed cheese and cheese spreads  Learning Assessment Quiz  Indicated T (for True) or F (for False) for each of the following statements:  1. _____ Fresh fruits and vegetables and unprocessed grains are generally low in sodium 2. _____ Water may contain a considerable amount of sodium, depending on the source 3. _____ You can always tell if a food is high in sodium by tasting it 4. _____ Certain laxatives my be high in sodium and should be avoided unless prescribed   by a physician or pharmacist 5. _____ Salt substitutes may be used freely by anyone on a sodium restricted diet 6.  _____ Sodium is present in table salt, food  additives and as a natural component of   most foods 7. _____ Table salt is approximately 90% sodium 8. _____ Limiting sodium intake may help prevent excess fluid accumulation in the body 9. _____ On a sodium-restricted diet, seasonings such as bouillon soy sauce, and    cooking wine should be used in place of table salt 10. _____ On an ingredient list, a product which lists monosodium glutamate as the first   ingredient is an appropriate food to include on a low sodium diet  Circle the best answer(s) to the following statements (Hint: there may be more than one correct answer)  11. On a low-sodium diet, some acceptable snack items are:    A. Olives  F. Bean dip   K. Grapefruit juice    B. Salted Pretzels G. Commercial Popcorn   L. Canned peaches    C. Carrot Sticks  H. Bouillon   M. Unsalted nuts   D. [place] fries  I. Peanut butter crackers N. Salami   E. Sweet pickles J. Tomato Juice   O. Pizza  12.  Seasonings that may be used freely on a reduced - sodium diet include   A. Lemon wedges F.Monosodium glutamate K. Celery seed    B.Soysauce   G. Pepper   L. Mustard powder   C. Sea salt  H. Cooking wine  M. Onion flakes   D. Vinegar  E. Prepared horseradish N. Salsa   E. Sage   J. Worcestershire sauce  O. Chutney     If you need a refill on your cardiac medications before your next appointment, please call your pharmacy.

## 2017-04-22 NOTE — Progress Notes (Signed)
Cardiology Office Note    Date:  04/22/2017   ID:  Colin Lee, DOB 28-Dec-1960, MRN [id number]  PCP:  Antionette Fairy, PA-C  Cardiologist: Fransico Him, MD  Chief Complaint  Patient presents with  . Follow-up    History of Present Illness:  Colin Lee is a 57 y.o. male with history of coronary artery disease status post DES x2 to the RCA 2016.  Had recurrent chest pain a relook cath demonstrated patent stents in the RCA.  He also has a history of moderate pleural effusion and community-acquired pneumonia.  Status post thoracentesis and found to have bloody effusion with fluid analysis showing lymphocytes.  ID and pulmonary suggested this could be rheumatic in origin but rheumatology workup was negative and no malignancy noted.  Also has history of hypertension, HLD last seen by Dr. Radford Pax 04/2016 and was doing well.  Patient comes in today for yearly check up.  He says his blood pressure has been running high around 165/97.  He eats a lot of salty foods including sausage daily, lunch meats and pizza.  He is also gained some weight.  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  If he is moving a lot of furniture around 50-80 pounds he develops a tightness in his chest and shortness of breath.  He knows when to stop and it goes away.  He says he has had this since his stents but it has gotten a little worse and more frequent recently.  Lipids are followed closely by primary care and he does have blood work and said it was normal.  We do not have copies of this.    Past Medical History:  Diagnosis Date  . Arthritis   . Asthma   . Coronary artery disease 2016   90% RCA and 30% LM S/P PCI of RCA  . GERD (gastroesophageal reflux disease)   . Headache    stress and tension  . Hypertension   . Neuromuscular disorder (HCC)    restless legs and leg cramps  . Shortness of breath    Starting May 2015    Past Surgical History:  Procedure Laterality Date  . CHEST  TUBE INSERTION Left 02/01/2014   Procedure: INSERTION PLEURAL DRAINAGE CATHETER;  Surgeon: Grace Isaac, MD;  Location: [place];  Service: Thoracic;  Laterality: Left;  . COLONOSCOPY W/ BIOPSIES AND POLYPECTOMY  2014   benign  . LEFT HEART CATHETERIZATION WITH CORONARY ANGIOGRAM N/A 06/07/2014   Procedure: LEFT HEART CATHETERIZATION WITH CORONARY ANGIOGRAM;  Surgeon: Peter M Martinique, MD;  Location: [facility] CATH LAB;  Service: Cardiovascular;  Laterality: N/A;  . LEFT HEART CATHETERIZATION WITH CORONARY ANGIOGRAM N/A 06/08/2014   Procedure: LEFT HEART CATHETERIZATION WITH CORONARY ANGIOGRAM;  Surgeon: Sinclair Grooms, MD;  Location: [facility] CATH LAB;  Service: Cardiovascular;  Laterality: N/A;  . no prior surgery    . PLEURAL BIOPSY Left 02/01/2014   Procedure: PLEURAL BIOPSY;  Surgeon: Grace Isaac, MD;  Location: [place];  Service: Thoracic;  Laterality: Left;  . PLEURAL EFFUSION DRAINAGE Left 02/01/2014   Procedure: DRAINAGE OF PLEURAL EFFUSION;  Surgeon: Grace Isaac, MD;  Location: [place];  Service: Thoracic;  Laterality: Left;  . REMOVAL OF PLEURAL DRAINAGE CATHETER Left 02/16/2014   Procedure: REMOVAL OF PLEURAL DRAINAGE CATHETER;  Surgeon: Grace Isaac, MD;  Location: [place];  Service: Thoracic;  Laterality: Left;  . TALC PLEURODESIS Left 02/01/2014   Procedure: TALC PLEURADESIS;  Surgeon: Grace Isaac, MD;  Location: [place];  Service: Thoracic;  Laterality: Left;  . THORACENTESIS Left 2015  . VIDEO ASSISTED THORACOSCOPY Left 02/01/2014   Procedure: VIDEO ASSISTED THORACOSCOPY;  Surgeon: Grace Isaac, MD;  Location: [place];  Service: Thoracic;  Laterality: Left;  Marland Kitchen VIDEO BRONCHOSCOPY N/A 02/01/2014   Procedure: VIDEO BRONCHOSCOPY;  Surgeon: Grace Isaac, MD;  Location: [facility] OR;  Service: Thoracic;  Laterality: N/A;    Current Medications: Current Meds  Medication Sig  . acetaminophen (TYLENOL) 500 MG tablet Take 1,000 mg by mouth every 6 (six) hours as needed for  moderate pain.  Marland Kitchen aspirin EC 81 MG tablet Take 81 mg by mouth daily.  Marland Kitchen atorvastatin (LIPITOR) 40 MG tablet Take 1 tablet (40 mg total) by mouth daily at 6 PM.  . clopidogrel (PLAVIX) 75 MG tablet Take 1 tablet (75 mg total) by mouth daily.  Marland Kitchen losartan (COZAAR) 50 MG tablet Take 50 mg by mouth daily.  . metoprolol tartrate (LOPRESSOR) 25 MG tablet Take 0.5 tablets (12.5 mg total) by mouth 2 (two) times daily.  . Multiple Vitamins-Minerals (CENTRUM ADULTS PO) Take 1 tablet by mouth daily.  . nitroGLYCERIN (NITROSTAT) 0.4 MG SL tablet Place 1 tablet (0.4 mg total) under the tongue every 5 (five) minutes as needed for chest pain.  . Omega-3 Fatty Acids (FISH OIL) 1000 MG CPDR Take 4,000 mg by mouth daily.  . pantoprazole (PROTONIX) 40 MG tablet Take 1 tablet (40 mg total) by mouth daily.     Allergies:   Patient has no known allergies.   Social History   Socioeconomic History  . Marital status: Married    Spouse name: None  . Number of children: None  . Years of education: None  . Highest education level: None  Social Needs  . Financial resource strain: None  . Food insecurity - worry: None  . Food insecurity - inability: None  . Transportation needs - medical: None  . Transportation needs - non-medical: None  Occupational History  . None  Tobacco Use  . Smoking status: Former Smoker    Packs/day: 1.00    Years: 30.00    Pack years: 30.00    Types: Cigarettes    Last attempt to quit: 04/08/2000    Years since quitting: 17.0  . Smokeless tobacco: Never Used  Substance and Sexual Activity  . Alcohol use: Yes    Comment: rare  . Drug use: No  . Sexual activity: None  Other Topics Concern  . None  Social History Narrative  . None     Family History:  The patient's He was adopted. Family history is unknown by patient.   ROS:   Please see the history of present illness.    Review of Systems  Constitution: Negative.  HENT: Negative.   Cardiovascular: Positive for chest  pain.  Respiratory: Negative.   Endocrine: Negative.   Hematologic/Lymphatic: Bruises/bleeds easily.  Musculoskeletal: Negative.   Gastrointestinal: Positive for abdominal pain.  Genitourinary: Positive for hesitancy.  Neurological: Negative.    All other systems reviewed and are negative.   PHYSICAL EXAM:   VS:  BP 138/70   Pulse 64   Ht '5\' 10"'  (1.778 m)   Wt 291 lb 12.8 oz (132.4 kg)   SpO2 93%   BMI 41.87 kg/m   Physical Exam  GEN: Obese, in no acute distress  Neck: no JVD, carotid bruits, or masses Cardiac:RRR; no murmurs, rubs, or gallops distant heart sounds Respiratory:  clear to auscultation bilaterally, normal work of breathing GI: soft, nontender, nondistended, + BS Ext: without cyanosis, clubbing, or edema, Good distal pulses bilaterally Neuro:  Alert and Oriented x 3 Psych: euthymic mood, full affect  Wt Readings from Last 3 Encounters:  04/22/17 291 lb 12.8 oz (132.4 kg)  04/15/16 292 lb 12.8 oz (132.8 kg)  08/11/15 284 lb (128.8 kg)      Studies/Labs Reviewed:   EKG:  EKG is ordered today.  The ekg ordered today demonstrates normal sinus rhythm, normal EKG  Recent Labs: No results found for requested labs within last 8760 hours.   Lipid Panel    Component Value Date/Time   CHOL 97 (L) 02/09/2015 1253   TRIG 90 02/09/2015 1253   HDL 28 (L) 02/09/2015 1253   CHOLHDL 3.5 02/09/2015 1253   VLDL 18 02/09/2015 1253   LDLCALC 51 02/09/2015 1253    Additional studies/ records that were reviewed today include:  Stress test 2016 Overall Impression:  Low risk stress nuclear study with a small in size, mild in intensity, fixed defect consistent with diaphragmatic attenuation.  There is no evidence of ischemia.  .   LV Ejection Fraction: 54%.  LV Wall Motion:  NL LV Function; NL Wall Motion   Signed; Fransico Him, MD [facility] 05/26/2014   Cardiac cath 3/2016ANGIOGRAPHIC DATA:   The left main coronary artery is widely patent and unchanged.   The  left anterior descending artery is widely patent and unchanged from images obtained yesterday..   The left circumflex artery is widely patent and unchanged.   The right coronary artery is widely patent without evidence of thrombus, dissection, or other abnormality.Marland Kitchen     LEFT VENTRICULOGRAM:  Left ventricular angiogram was not performed      ASSESSMENT:    1. Chest pain, unspecified type   2. Coronary artery disease of native artery of native heart with stable angina pectoris ([place])   3. Essential hypertension   4. Dyslipidemia      PLAN:  In order of problems listed above:  Chest pain described as tightness when doing heavy lifting has progressively worsened since last year.  He thought it was due to his weight gain.  Blood pressure is also been up.  Will order GXT Myoview to rule out ischemia.  Follow-up with Dr. Radford Pax in 4 months or sooner if abnormal stress test.  CAD status post DES to the RCA 2016 with follow-up cath showing no restenosis.  Essential hypertension blood pressure has been up at home.  Will increase Cozaar to 100 mg daily.  2 g sodium diet.  Will need  be met checked in 2-3 weeks.  Dyslipidemia on Lipitor and fish oil managed by primary care.    Medication Adjustments/Labs and Tests Ordered: Current medicines are reviewed at length with the patient today.  Concerns regarding medicines are outlined above.  Medication changes, Labs and Tests ordered today are listed in the Patient Instructions below. There are no Patient Instructions on file for this visit.   Sumner Boast, PA-C  04/22/2017 1:04 PM    [facility] [address], [place], [place]  [address] Phone: [phone]; Fax: [phone]

## 2017-04-23 ENCOUNTER — Telehealth (HOSPITAL_COMMUNITY): Payer: Self-pay | Admitting: *Deleted

## 2017-04-23 NOTE — Telephone Encounter (Signed)
Left message on voicemail in reference to upcoming appointment scheduled for 04/28/17. Phone number given for a call back so details instructions can be given. Colin Lee

## 2017-04-24 ENCOUNTER — Other Ambulatory Visit: Payer: Self-pay | Admitting: Cardiology

## 2017-04-28 ENCOUNTER — Ambulatory Visit (HOSPITAL_COMMUNITY): Payer: BC Managed Care – PPO | Attending: Internal Medicine

## 2017-04-28 ENCOUNTER — Other Ambulatory Visit: Payer: BC Managed Care – PPO | Admitting: *Deleted

## 2017-04-28 DIAGNOSIS — I10 Essential (primary) hypertension: Secondary | ICD-10-CM

## 2017-04-28 DIAGNOSIS — R079 Chest pain, unspecified: Secondary | ICD-10-CM

## 2017-04-28 DIAGNOSIS — I25118 Atherosclerotic heart disease of native coronary artery with other forms of angina pectoris: Secondary | ICD-10-CM

## 2017-04-28 DIAGNOSIS — E785 Hyperlipidemia, unspecified: Secondary | ICD-10-CM

## 2017-04-28 LAB — BASIC METABOLIC PANEL
BUN/Creatinine Ratio: 15 (ref 9–20)
BUN: 15 mg/dL (ref 6–24)
CO2: 24 mmol/L (ref 20–29)
Calcium: 9.6 mg/dL (ref 8.7–10.2)
Chloride: 103 mmol/L (ref 96–106)
Creatinine, Ser: 1.02 mg/dL (ref 0.76–1.27)
GFR calc Af Amer: 96 mL/min/{1.73_m2} (ref >59)
GFR calc non Af Amer: 83 mL/min/{1.73_m2} (ref >59)
Glucose: 94 mg/dL (ref 65–99)
Potassium: 4.2 mmol/L (ref 3.5–5.2)
Sodium: 143 mmol/L (ref 134–144)

## 2017-04-28 MED ORDER — TECHNETIUM TC 99M TETROFOSMIN IV KIT
32.9000 | PACK | Freq: Once | INTRAVENOUS | Status: AC | PRN
  Administered 2017-04-28: 32.9 via INTRAVENOUS
  Filled 2017-04-28: qty 33

## 2017-04-29 ENCOUNTER — Ambulatory Visit (HOSPITAL_COMMUNITY): Payer: BC Managed Care – PPO | Attending: Cardiovascular Disease

## 2017-04-29 LAB — MYOCARDIAL PERFUSION IMAGING
Estimated workload: 9.3 METS
Exercise duration (min): 7 min
Exercise duration (sec): 30 s
LV dias vol: 104 mL (ref 62–150)
LV sys vol: 41 mL
MPHR: 166 {beats}/min
Peak HR: 153 {beats}/min
Percent HR: 92 %
RATE: 0.28
Rest HR: 85 {beats}/min
SDS: 2
SRS: 1
SSS: 3
TID: 0.81

## 2017-04-29 MED ORDER — TECHNETIUM TC 99M TETROFOSMIN IV KIT
31.8000 | PACK | Freq: Once | INTRAVENOUS | Status: AC | PRN
  Administered 2017-04-29: 31.8 via INTRAVENOUS
  Filled 2017-04-29: qty 32

## 2017-05-16 ENCOUNTER — Encounter: Payer: Self-pay | Admitting: Gastroenterology

## 2017-05-16 ENCOUNTER — Ambulatory Visit: Payer: BC Managed Care – PPO | Admitting: Gastroenterology

## 2017-05-16 VITALS — BP 122/72 | HR 74 | Ht 70.0 in | Wt 284.5 lb

## 2017-05-16 DIAGNOSIS — Z86010 Personal history of colonic polyps: Secondary | ICD-10-CM | POA: Diagnosis not present

## 2017-05-16 DIAGNOSIS — R1032 Left lower quadrant pain: Secondary | ICD-10-CM | POA: Diagnosis not present

## 2017-05-16 DIAGNOSIS — R1031 Right lower quadrant pain: Secondary | ICD-10-CM

## 2017-05-16 DIAGNOSIS — K219 Gastro-esophageal reflux disease without esophagitis: Secondary | ICD-10-CM | POA: Diagnosis not present

## 2017-05-16 NOTE — Patient Instructions (Addendum)
You have been scheduled for a CT scan of the abdomen and pelvis at [facility] ([address]---this is in the same building as Press photographer).   You are scheduled on 05/27/17 at 2:30pm. You should arrive 15 minutes prior to your appointment time for registration. Please follow the written instructions below on the day of your exam:  WARNING: IF YOU ARE ALLERGIC TO IODINE/X-RAY DYE, PLEASE NOTIFY RADIOLOGY IMMEDIATELY AT [phone]! YOU WILL BE GIVEN A 13 HOUR PREMEDICATION PREP.  1) Do not eat or drink anything after 10:30am (4 hours prior to your test) 2) You have been given 2 bottles of oral contrast to drink. The solution may taste               better if refrigerated, but do NOT add ice or any other liquid to this solution. Shake             well before drinking.    Drink 1 bottle of contrast @ 12:30pm (2 hours prior to your exam)  Drink 1 bottle of contrast @ 1:30pm (1 hour prior to your exam)  You may take any medications as prescribed with a small amount of water except for the following: Metformin, Glucophage, Glucovance, Avandamet, Riomet, Fortamet, Actoplus Met, Janumet, Glumetza or Metaglip. The above medications must be held the day of the exam AND 48 hours after the exam.  The purpose of you drinking the oral contrast is to aid in the visualization of your intestinal tract. The contrast solution may cause some diarrhea. Before your exam is started, you will be given a small amount of fluid to drink. Depending on your individual set of symptoms, you may also receive an intravenous injection of x-ray contrast/dye. Plan on being at [facility] for 30 minutes or longer, depending on the type of exam you are having performed.  This test typically takes 30-45 minutes to complete.  If you have any questions regarding your exam or if you need to reschedule, you may call the CT department at [phone] between the hours of 8:00 am and 5:00 pm,  Monday-Friday.  ________________________________________________________________________  Dennis Bast will be due for a recall colonoscopy in 03/2018. We will send you a reminder in the mail when it gets closer to that time.  Thank you for choosing me and [facility].  Pricilla Riffle. Dagoberto Ligas., MD., Marval Regal

## 2017-05-16 NOTE — Progress Notes (Signed)
History of Present Illness: This is a 57 year old male referred by Antionette Fairy, PA-C for the evaluation of lower abdominal pain.  He relates problems with mild lower abdominal pain that is associated with movement.  He notes it particularly when getting out of bed the pain resolves rapidly.  It is not associated with any digestive function.  He has noted frequent urination at night since August however the symptoms have improved.  He notes occasional hemorrhoidal symptoms with swelling and irritation that are easily controlled with Preparation H. Denies weight loss, constipation, diarrhea, change in stool caliber, melena, hematochezia, nausea, vomiting, dysphagia, reflux symptoms, chest pain.    No Known Allergies Outpatient Medications Prior to Visit  Medication Sig Dispense Refill  . acetaminophen (TYLENOL) 500 MG tablet Take 1,000 mg by mouth every 6 (six) hours as needed for moderate pain.    Marland Kitchen aspirin EC 81 MG tablet Take 81 mg by mouth daily.    Marland Kitchen atorvastatin (LIPITOR) 40 MG tablet Take 1 tablet (40 mg total) by mouth daily at 6 PM. 30 tablet 0  . clopidogrel (PLAVIX) 75 MG tablet TAKE 1 TABLET BY MOUTH ONCE DAILY. 30 tablet 11  . losartan (COZAAR) 100 MG tablet Take 1 tablet (100 mg total) by mouth daily. 30 tablet 11  . metoprolol tartrate (LOPRESSOR) 25 MG tablet Take 0.5 tablets (12.5 mg total) by mouth 2 (two) times daily. 60 tablet 0  . Multiple Vitamins-Minerals (CENTRUM ADULTS PO) Take 1 tablet by mouth daily.    . nitroGLYCERIN (NITROSTAT) 0.4 MG SL tablet Place 1 tablet (0.4 mg total) under the tongue every 5 (five) minutes as needed for chest pain. 25 tablet 3  . Omega-3 Fatty Acids (FISH OIL) 1000 MG CPDR Take 4,000 mg by mouth daily.    . pantoprazole (PROTONIX) 40 MG tablet TAKE 1 TABLET BY MOUTH ONCE DAILY. 30 tablet 11   No facility-administered medications prior to visit.    Past Medical History:  Diagnosis Date  . Arthritis   . Asthma   . Coronary artery  disease 2016   90% RCA and 30% LM S/P PCI of RCA  . GERD (gastroesophageal reflux disease)   . Headache    stress and tension  . History of nuclear stress test    Myoview 1/19: EF 61, no ischemia, low risk  . Hypertension   . Neuromuscular disorder (HCC)    restless legs and leg cramps  . Shortness of breath    Starting May 2015  . Tubular adenoma of colon 03/2013   Past Surgical History:  Procedure Laterality Date  . CHEST TUBE INSERTION Left 02/01/2014   Procedure: INSERTION PLEURAL DRAINAGE CATHETER;  Surgeon: Grace Isaac, MD;  Location: [place];  Service: Thoracic;  Laterality: Left;  . COLONOSCOPY W/ BIOPSIES AND POLYPECTOMY  2014   benign  . LEFT HEART CATHETERIZATION WITH CORONARY ANGIOGRAM N/A 06/07/2014   Procedure: LEFT HEART CATHETERIZATION WITH CORONARY ANGIOGRAM;  Surgeon: Peter M Martinique, MD;  Location: [facility] CATH LAB;  Service: Cardiovascular;  Laterality: N/A;  . LEFT HEART CATHETERIZATION WITH CORONARY ANGIOGRAM N/A 06/08/2014   Procedure: LEFT HEART CATHETERIZATION WITH CORONARY ANGIOGRAM;  Surgeon: Sinclair Grooms, MD;  Location: [facility] C[facility] LAB;  Service: Cardiovascular;  Laterality: N/A;  . no prior surgery    . PLEURAL BIOPSY Left 02/01/2014   Procedure: PLEURAL BIOPSY;  Surgeon: Grace Isaac, MD;  Location: [place];  Service: Thoracic;  Laterality: Left;  . PLEURAL  EFFUSION DRAINAGE Left 02/01/2014   Procedure: DRAINAGE OF PLEURAL EFFUSION;  Surgeon: Grace Isaac, MD;  Location: [place];  Service: Thoracic;  Laterality: Left;  . REMOVAL OF PLEURAL DRAINAGE CATHETER Left 02/16/2014   Procedure: REMOVAL OF PLEURAL DRAINAGE CATHETER;  Surgeon: Grace Isaac, MD;  Location: [place];  Service: Thoracic;  Laterality: Left;  . TALC PLEURODESIS Left 02/01/2014   Procedure: Pietro Cassis;  Surgeon: Grace Isaac, MD;  Location: [place];  Service: Thoracic;  Laterality: Left;  . THORACENTESIS Left 2015  . VIDEO ASSISTED THORACOSCOPY Left 02/01/2014   Procedure:  VIDEO ASSISTED THORACOSCOPY;  Surgeon: Grace Isaac, MD;  Location: [place];  Service: Thoracic;  Laterality: Left;  Marland Kitchen VIDEO BRONCHOSCOPY N/A 02/01/2014   Procedure: VIDEO BRONCHOSCOPY;  Surgeon: Grace Isaac, MD;  Location: [facility];  Service: Thoracic;  Laterality: N/A;   Social History   Socioeconomic History  . Marital status: Married    Spouse name: None  . Number of children: None  . Years of education: None  . Highest education level: None  Social Needs  . Financial resource strain: None  . Food insecurity - worry: None  . Food insecurity - inability: None  . Transportation needs - medical: None  . Transportation needs - non-medical: None  Occupational History  . None  Tobacco Use  . Smoking status: Former Smoker    Packs/day: 1.00    Years: 30.00    Pack years: 30.00    Types: Cigarettes    Last attempt to quit: 04/08/2000    Years since quitting: 17.1  . Smokeless tobacco: Never Used  Substance and Sexual Activity  . Alcohol use: Yes    Comment: rare  . Drug use: No  . Sexual activity: None  Other Topics Concern  . None  Social History Narrative  . None   Family History  Adopted: Yes  Family history unknown: Yes       Review of Systems: Pertinent positive and negative review of systems were noted in the above HPI section. All other review of systems were otherwise negative.    Physical Exam: General: Well developed, well nourished, no acute distress Head: Normocephalic and atraumatic Eyes:  sclerae anicteric, EOMI Ears: Normal auditory acuity Mouth: No deformity or lesions Neck: Supple, no masses or thyromegaly Lungs: Clear throughout to auscultation Heart: Regular rate and rhythm; no murmurs, rubs or bruits Abdomen: Soft, large, non tender and non distended. No masses, hepatosplenomegaly or hernias noted. Normal Bowel sounds Rectal: deferred to colonoscop Musculoskeletal: Symmetrical with no gross deformities  Skin: No lesions on visible  extremities Pulses:  Normal pulses noted Extremities: No clubbing, cyanosis, edema or deformities noted Neurological: Alert oriented x 4, grossly nonfocal Cervical Nodes:  No significant cervical adenopathy Inguinal Nodes: No significant inguinal adenopathy Psychological:  Alert and cooperative. Normal mood and affect  Assessment and Recommendations:  1.  Recurrent lower abdominal pain.  Schedule abdominal/pelvic CT.     2. Personal history of adenomatous colon polyps.  A 5-year interval surveillance colonoscopy is recommended in December 2019.  He is maintained on Plavix daily.  3. GERD.  Continue pantoprazole 40 mg daily and standard antireflux measures.  cc: Vesta Mixer [address] [place], [place] [address]

## 2017-05-27 ENCOUNTER — Ambulatory Visit (INDEPENDENT_AMBULATORY_CARE_PROVIDER_SITE_OTHER)
Admission: RE | Admit: 2017-05-27 | Discharge: 2017-05-27 | Disposition: A | Discharge status: Home / Self Care | Payer: BC Managed Care – PPO | Source: Ambulatory Visit | Attending: Gastroenterology | Admitting: Gastroenterology

## 2017-05-27 DIAGNOSIS — R1031 Right lower quadrant pain: Secondary | ICD-10-CM

## 2017-05-27 DIAGNOSIS — R1032 Left lower quadrant pain: Secondary | ICD-10-CM | POA: Diagnosis not present

## 2017-05-27 MED ORDER — IOPAMIDOL (ISOVUE-300) INJECTION 61%
100.0000 mL | Freq: Once | INTRAVENOUS | Status: AC | PRN
  Administered 2017-05-27: 100 mL via INTRAVENOUS

## 2017-05-29 ENCOUNTER — Encounter: Payer: Self-pay | Admitting: Gastroenterology

## 2017-06-16 ENCOUNTER — Telehealth: Payer: Self-pay | Admitting: Emergency Medicine

## 2017-06-16 ENCOUNTER — Other Ambulatory Visit (INDEPENDENT_AMBULATORY_CARE_PROVIDER_SITE_OTHER): Payer: BC Managed Care – PPO

## 2017-06-16 ENCOUNTER — Ambulatory Visit: Payer: BC Managed Care – PPO | Admitting: Physician Assistant

## 2017-06-16 ENCOUNTER — Encounter: Payer: Self-pay | Admitting: Physician Assistant

## 2017-06-16 VITALS — BP 130/78 | HR 80 | Ht 70.0 in | Wt 282.0 lb

## 2017-06-16 DIAGNOSIS — R102 Pelvic and perineal pain: Secondary | ICD-10-CM

## 2017-06-16 DIAGNOSIS — K7460 Unspecified cirrhosis of liver: Secondary | ICD-10-CM | POA: Diagnosis not present

## 2017-06-16 DIAGNOSIS — Z7901 Long term (current) use of anticoagulants: Secondary | ICD-10-CM

## 2017-06-16 LAB — CBC WITH DIFFERENTIAL/PLATELET
Basophils Absolute: 0 10*3/uL (ref 0.0–0.1)
Basophils Relative: 0.6 % (ref 0.0–3.0)
Eosinophils Absolute: 0.4 10*3/uL (ref 0.0–0.7)
Eosinophils Relative: 6.3 % — ABNORMAL HIGH (ref 0.0–5.0)
HCT: 38.8 % — ABNORMAL LOW (ref 39.0–52.0)
Hemoglobin: 13.3 g/dL (ref 13.0–17.0)
Lymphocytes Relative: 25.5 % (ref 12.0–46.0)
Lymphs Abs: 1.8 10*3/uL (ref 0.7–4.0)
MCHC: 34.2 g/dL (ref 30.0–36.0)
MCV: 82.6 fl (ref 78.0–100.0)
Monocytes Absolute: 0.5 10*3/uL (ref 0.1–1.0)
Monocytes Relative: 7.7 % (ref 3.0–12.0)
Neutro Abs: 4.2 10*3/uL (ref 1.4–7.7)
Neutrophils Relative %: 59.9 % (ref 43.0–77.0)
Platelets: 234 10*3/uL (ref 150.0–400.0)
RBC: 4.7 Mil/uL (ref 4.22–5.81)
RDW: 14.1 % (ref 11.5–15.5)
WBC: 7.1 10*3/uL (ref 4.0–10.5)

## 2017-06-16 LAB — PROTIME-INR
INR: 1.1 ratio — ABNORMAL HIGH (ref 0.8–1.0)
Prothrombin Time: 12.1 s (ref 9.6–13.1)

## 2017-06-16 NOTE — Telephone Encounter (Signed)
   Colin Lee [birth year] [id number]   We have scheduled the above named patient for an endoscopy procedure. Our records show that he is on anticoagulation therapy.  Please advise as to whether the patient may come off their therapy of Plavix 5 days prior to their procedure which is scheduled for 07-23-17.  Please route your response to [name], CMA or fax response to [phone].  Sincerely,    [facility]

## 2017-06-16 NOTE — Patient Instructions (Signed)
Your physician has requested that you go to the basement for lab work before leaving today.  You have been scheduled for an endoscopy. Please follow written instructions given to you at your visit today. If you use inhalers (even only as needed), please bring them with you on the day of your procedure. Your physician has requested that you go to www.startemmi.com and enter the access code given to you at your visit today. This web site gives a general overview about your procedure. However, you should still follow specific instructions given to you by our office regarding your preparation for the procedure.  We are referring you to a dietician.

## 2017-06-16 NOTE — Progress Notes (Signed)
Chief Complaint: Abnormal liver imaging  HPI:    Colin Lee is a 57 year old male with a past medical history as listed below, who follows with Dr. Fuller Plan and was referred to me by Antionette Fairy, PA-C for a complaint of abnormal liver imaging.      CT abdomen pelvis with contrast on 05/28/17 for bilateral lower abdominal pain times 8 months showed suspected mild cirrhotic changes involving the liver associated mild splenomegaly but no ascites.  Simple appearing hepatic cysts.  No worrisome hepatic lesions.  Cholelithiasis without CT findings for acute cholecystitis.  Periportal and retroperitoneal lymph nodes likely related to the patient's cirrhosis.  Colonic diverticulosis without findings for acute diverticulitis.  Age advanced atherosclerotic calcifications involving [place] and iliac arteries.  Bilateral inguinal hernias containing fat.    Last office visit 05/16/17 Dr. Fuller Plan for lower abdominal pain.  CT scheduled.  Repeat colonoscopy recommended December 2019 for history of adenomatous colon polyps.  Maintain on Plavix daily.  Continued on pantoprazole 40 daily for GERD.    Today, presents to clinic accompanied by his wife and tells me that he continues with a suprapubic pain.  Explains that he has been to see a urologist who tells him it is not his prostate.  He only experiences this pain typically when he has a full bladder.  Also difficulties with urinating, starting/initiating a stream and expelling his bladder.  He has follow-up with the urologist soon.    Asked me further questions regarding recent CT and cirrhosis.  Describes a history of heavier alcohol use in his youth, but none over the past 30+ years, currently he is only a once a year social drinker.  Does explain history of fatty liver disease.  Denies family history of liver disease, hepatitis or blood transfusions.  Patient empirically stopped his Lipitor as he read this may be causing problems.    Denies fever, chills, blood in  stool, melena, weight loss, anorexia, nausea, vomiting, heartburn or reflux.  Past Medical History:  Diagnosis Date  . Arthritis   . Asthma   . Coronary artery disease 2016   90% RCA and 30% LM S/P PCI of RCA  . GERD (gastroesophageal reflux disease)   . Headache    stress and tension  . History of nuclear stress test    Myoview 1/19: EF 61, no ischemia, low risk  . Hypertension   . Neuromuscular disorder (HCC)    restless legs and leg cramps  . Shortness of breath    Starting May 2015  . Tubular adenoma of colon 03/2013    Past Surgical History:  Procedure Laterality Date  . CHEST TUBE INSERTION Left 02/01/2014   Procedure: INSERTION PLEURAL DRAINAGE CATHETER;  Surgeon: Grace Isaac, MD;  Location: [place];  Service: Thoracic;  Laterality: Left;  . COLONOSCOPY W/ BIOPSIES AND POLYPECTOMY  2014   benign  . LEFT HEART CATHETERIZATION WITH CORONARY ANGIOGRAM N/A 06/07/2014   Procedure: LEFT HEART CATHETERIZATION WITH CORONARY ANGIOGRAM;  Surgeon: Peter M Martinique, MD;  Location: [facility] CATH LAB;  Service: Cardiovascular;  Laterality: N/A;  . LEFT HEART CATHETERIZATION WITH CORONARY ANGIOGRAM N/A 06/08/2014   Procedure: LEFT HEART CATHETERIZATION WITH CORONARY ANGIOGRAM;  Surgeon: Sinclair Grooms, MD;  Location: [facility] CATH LAB;  Service: Cardiovascular;  Laterality: N/A;  . no prior surgery    . PLEURAL BIOPSY Left 02/01/2014   Procedure: PLEURAL BIOPSY;  Surgeon: Grace Isaac, MD;  Location: [place];  Service: Thoracic;  Laterality: Left;  .  PLEURAL EFFUSION DRAINAGE Left 02/01/2014   Procedure: DRAINAGE OF PLEURAL EFFUSION;  Surgeon: Grace Isaac, MD;  Location: [place];  Service: Thoracic;  Laterality: Left;  . REMOVAL OF PLEURAL DRAINAGE CATHETER Left 02/16/2014   Procedure: REMOVAL OF PLEURAL DRAINAGE CATHETER;  Surgeon: Grace Isaac, MD;  Location: [place];  Service: Thoracic;  Laterality: Left;  . TALC PLEURODESIS Left 02/01/2014   Procedure: Pietro Cassis;  Surgeon:  Grace Isaac, MD;  Location: [place];  Service: Thoracic;  Laterality: Left;  . THORACENTESIS Left 2015  . VIDEO ASSISTED THORACOSCOPY Left 02/01/2014   Procedure: VIDEO ASSISTED THORACOSCOPY;  Surgeon: Grace Isaac, MD;  Location: [place];  Service: Thoracic;  Laterality: Left;  Marland Kitchen VIDEO BRONCHOSCOPY N/A 02/01/2014   Procedure: VIDEO BRONCHOSCOPY;  Surgeon: Grace Isaac, MD;  Location: [facility] OR;  Service: Thoracic;  Laterality: N/A;    Current Outpatient Medications  Medication Sig Dispense Refill  . acetaminophen (TYLENOL) 500 MG tablet Take 1,000 mg by mouth every 6 (six) hours as needed for moderate pain.    Marland Kitchen aspirin EC 81 MG tablet Take 81 mg by mouth daily.    Marland Kitchen atorvastatin (LIPITOR) 40 MG tablet Take 1 tablet (40 mg total) by mouth daily at 6 PM. 30 tablet 0  . clopidogrel (PLAVIX) 75 MG tablet TAKE 1 TABLET BY MOUTH ONCE DAILY. 30 tablet 11  . losartan (COZAAR) 100 MG tablet Take 1 tablet (100 mg total) by mouth daily. 30 tablet 11  . metoprolol tartrate (LOPRESSOR) 25 MG tablet Take 0.5 tablets (12.5 mg total) by mouth 2 (two) times daily. 60 tablet 0  . Multiple Vitamins-Minerals (CENTRUM ADULTS PO) Take 1 tablet by mouth daily.    . nitroGLYCERIN (NITROSTAT) 0.4 MG SL tablet Place 1 tablet (0.4 mg total) under the tongue every 5 (five) minutes as needed for chest pain. 25 tablet 3  . Omega-3 Fatty Acids (FISH OIL) 1000 MG CPDR Take 4,000 mg by mouth daily.    . pantoprazole (PROTONIX) 40 MG tablet TAKE 1 TABLET BY MOUTH ONCE DAILY. 30 tablet 11   No current facility-administered medications for this visit.     Allergies as of 06/16/2017  . (No Known Allergies)    Family History  Adopted: Yes  Family history unknown: Yes    Social History   Socioeconomic History  . Marital status: Married    Spouse name: Not on file  . Number of children: Not on file  . Years of education: Not on file  . Highest education level: Not on file  Social Needs  . Financial  resource strain: Not on file  . Food insecurity - worry: Not on file  . Food insecurity - inability: Not on file  . Transportation needs - medical: Not on file  . Transportation needs - non-medical: Not on file  Occupational History  . Not on file  Tobacco Use  . Smoking status: Former Smoker    Packs/day: 1.00    Years: 30.00    Pack years: 30.00    Types: Cigarettes    Last attempt to quit: 04/08/2000    Years since quitting: 17.2  . Smokeless tobacco: Never Used  Substance and Sexual Activity  . Alcohol use: Yes    Comment: rare  . Drug use: No  . Sexual activity: Not on file  Other Topics Concern  . Not on file  Social History Narrative  . Not on file    Review of Systems:  Constitutional: No weight loss, fever or chills Cardiovascular: No chest pain Respiratory: No SOB  Gastrointestinal: See HPI and otherwise negative   Physical Exam:  Vital signs: BP 130/78   Pulse 80   Ht 5\' 10"  (1.778 m)   Wt 282 lb (127.9 kg)   BMI 40.46 kg/m    Constitutional:   Pleasant obese Caucasian male appears to be in NAD, Well developed, Well nourished, alert and cooperative Respiratory: Respirations even and unlabored. Lungs clear to auscultation bilaterally.   No wheezes, crackles, or rhonchi.  Cardiovascular: Normal S1, S2. No MRG. Regular rate and rhythm. No peripheral edema, cyanosis or pallor.  Gastrointestinal:  Soft, nondistended, mild suprapubic pain to deep palpation, No rebound or guarding. Normal bowel sounds. No appreciable masses or hepatomegaly. Rectal:  Not performed.  Psychiatric: Demonstrates good judgement and reason without abnormal affect or behaviors.  No recent labs.  See CT in HPI.  Assessment: 1.  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  Suprapubic pain:  Continues, worse with a full bladder, being worked up by the urologist 3.  Chronic anticoagulation: On Plavix for CAD  Plan: 1.  Spent a long time discussing the process of cirrhosis with the patient.  If this is in fact due to fatty liver disease encouraged a 1-2 pound slow and steady weight loss. 2.  Sent referral to a dietitian to discuss above.  This can help decrease progression of cirrhosis. 3.  Ordered labs today including CBC, CMP, PT/INR, iron studies, alpha-1 antitrypsin, AMA, ASMA, ANA, ceruloplasmin, hepatitis panel and AFP 4.  Recommend patient have an EGD for variceal screening.  Scheduled this today with Dr. Fuller Plan in [facility].  Did discuss risk, benefits, limitations alternatives and patient agrees to proceed. 5.  Patient advised to hold his Plavix for 5 days prior to his procedure.  We will communicate with his cardiologist to ensure that holding his Plavix is acceptable for him. 6.  Recommend the patient continue to follow with urology regarding suprapubic pain worse with a full bladder. 7.  Would recommend the patient restart his Lipitor. 8.  Patient to follow in clinic with Dr. Fuller Plan in 4-6 months.  This appointment was made today for follow-up of his cirrhosis.  Colin Newer, PA-C [facility] 06/16/2017, 2:15 PM  Cc: Antionette Fairy, PA-C

## 2017-06-17 LAB — IBC PANEL
Iron: 38 ug/dL — ABNORMAL LOW (ref 42–165)
Saturation Ratios: 9.8 % — ABNORMAL LOW (ref 20.0–50.0)
Transferrin: 276 mg/dL (ref 212.0–360.0)

## 2017-06-17 LAB — COMPREHENSIVE METABOLIC PANEL
ALT: 53 U/L (ref 0–53)
AST: 35 U/L (ref 0–37)
Albumin: 4.3 g/dL (ref 3.5–5.2)
Alkaline Phosphatase: 65 U/L (ref 39–117)
BUN: 14 mg/dL (ref 6–23)
CO2: 26 mEq/L (ref 19–32)
Calcium: 9.7 mg/dL (ref 8.4–10.5)
Chloride: 104 mEq/L (ref 96–112)
Creatinine, Ser: 0.92 mg/dL (ref 0.40–1.50)
GFR: 90.87 mL/min (ref >60.00)
Glucose, Bld: 101 mg/dL — ABNORMAL HIGH (ref 70–99)
Potassium: 4 mEq/L (ref 3.5–5.1)
Sodium: 138 mEq/L (ref 135–145)
Total Bilirubin: 0.3 mg/dL (ref 0.2–1.2)
Total Protein: 7.5 g/dL (ref 6.0–8.3)

## 2017-06-17 LAB — FERRITIN: Ferritin: 71 ng/mL (ref 22.0–322.0)

## 2017-06-17 NOTE — Telephone Encounter (Signed)
Dr. Radford Pax can pt hold plavix for colonoscopy?  5 days ?  nuc in jan was neg.  please reply to CV pre op  thanks

## 2017-06-17 NOTE — Telephone Encounter (Signed)
Ok to be off Plavix for colonoscopy

## 2017-06-19 LAB — HEPATITIS C ANTIBODY
Hepatitis C Ab: NONREACTIVE
SIGNAL TO CUT-OFF: 0.12 (ref <1.00)

## 2017-06-19 LAB — MITOCHONDRIAL ANTIBODIES: Mitochondrial M2 Ab, IgG: <=20 U

## 2017-06-19 LAB — HEPATITIS B SURFACE ANTIGEN: Hepatitis B Surface Ag: NONREACTIVE

## 2017-06-19 LAB — ANA: Anti Nuclear Antibody(ANA): POSITIVE — AB

## 2017-06-19 LAB — AFP TUMOR MARKER: AFP-Tumor Marker: 1.5 ng/mL (ref <6.1)

## 2017-06-19 LAB — ALPHA-1-ANTITRYPSIN: A-1 Antitrypsin, Ser: 179 mg/dL (ref 83–199)

## 2017-06-19 LAB — ANTI-NUCLEAR AB-TITER (ANA TITER): ANA Titer 1: 1:160 {titer} — ABNORMAL HIGH

## 2017-06-19 LAB — HEPATITIS A ANTIBODY, TOTAL: Hepatitis A AB,Total: NONREACTIVE

## 2017-06-19 LAB — HEPATITIS B SURFACE ANTIBODY,QUALITATIVE: Hep B S Ab: REACTIVE — AB

## 2017-06-19 LAB — CERULOPLASMIN: Ceruloplasmin: 29 mg/dL (ref 18–36)

## 2017-06-19 LAB — ANTI-SMOOTH MUSCLE ANTIBODY, IGG: Actin (Smooth Muscle) Antibody (IGG): 27 U — ABNORMAL HIGH (ref <20)

## 2017-06-19 NOTE — Telephone Encounter (Signed)
Left message on patients voicemail to call office.   

## 2017-06-21 NOTE — Progress Notes (Signed)
Reviewed and agree with initial management plan.  Mahathi Pokorney T. Monte Zinni, MD FACG 

## 2017-06-24 ENCOUNTER — Other Ambulatory Visit: Payer: Self-pay

## 2017-06-24 DIAGNOSIS — R899 Unspecified abnormal finding in specimens from other organs, systems and tissues: Secondary | ICD-10-CM

## 2017-06-24 NOTE — Telephone Encounter (Signed)
Left message on patient's voicemail.

## 2017-06-25 NOTE — Telephone Encounter (Signed)
Left message on machine.

## 2017-06-26 ENCOUNTER — Telehealth: Payer: Self-pay

## 2017-06-26 NOTE — Telephone Encounter (Signed)
Please have him schedule an office appt with me to further

## 2017-06-26 NOTE — Telephone Encounter (Signed)
Left message on machine to call back  

## 2017-06-26 NOTE — Telephone Encounter (Signed)
Dr Fuller Plan and Colin Lee,  This pt called and states he is not comfortable having the liver biopsy.  He is on plavix and states he has stents and does not want to stop for the biopsy.  Please advise .

## 2017-06-27 NOTE — Telephone Encounter (Signed)
Dr Fuller Plan, this pt has an appt for endo on 4/17.  You wanted him to have an office visit to discuss liver biopsy.  Does he need an appt prior to the ENDO or do you want to discuss at that appt.  You do not have any office visits prior to the ENDO.  Please advise

## 2017-06-27 NOTE — Telephone Encounter (Signed)
Office appt can wait until after the EGD. You can use a new patient office appt slot if needed to get him in sooner.

## 2017-06-27 NOTE — Telephone Encounter (Signed)
The pt has been scheduled for an appt with Dr Fuller Plan to discuss liver biopsy.  Pt aware

## 2017-06-30 NOTE — Telephone Encounter (Signed)
I have spoken to patient to advise that per Dr Radford Pax, he may hold Plavix 5 days prior to his endoscopy procedure scheduled 07/23/17. He states that he will need to speak to Dr Theodosia Blender office himself to confirm this as he was under the impression he is to never come off Plavix after having stent placement. I advised that he is more than welcome to do this if it makes him more comfortable in holding the medication prior to his procedure.

## 2017-07-09 ENCOUNTER — Encounter: Payer: Self-pay | Admitting: Gastroenterology

## 2017-07-23 ENCOUNTER — Ambulatory Visit (AMBULATORY_SURGERY_CENTER): Payer: BC Managed Care – PPO | Admitting: Gastroenterology

## 2017-07-23 ENCOUNTER — Other Ambulatory Visit: Payer: Self-pay

## 2017-07-23 ENCOUNTER — Encounter: Payer: Self-pay | Admitting: Gastroenterology

## 2017-07-23 VITALS — BP 131/69 | HR 70 | Temp 98.9°F | Resp 30 | Ht 70.0 in | Wt 282.0 lb

## 2017-07-23 DIAGNOSIS — K7460 Unspecified cirrhosis of liver: Secondary | ICD-10-CM | POA: Diagnosis not present

## 2017-07-23 DIAGNOSIS — K317 Polyp of stomach and duodenum: Secondary | ICD-10-CM

## 2017-07-23 MED ORDER — SODIUM CHLORIDE 0.9 % IV SOLN
500.0000 mL | INTRAVENOUS | Status: DC
Start: 2017-07-23 — End: 2018-03-31

## 2017-07-23 NOTE — Op Note (Signed)
[facility] Patient Name: Colin Lee Procedure Date: 07/23/2017 10:32 AM MRN: [id number] Endoscopist: Ladene Artist , MD Age: 57 Referring MD:  Date of Birth: [birth year] Gender: Male Account #: [id number] Procedure:                Upper GI endoscopy Indications:              Screening procedure. Cirrhosis, screening for                            varices Medicines:                Monitored Anesthesia Care Procedure:                Pre-Anesthesia Assessment:                           - Prior to the procedure, a History and Physical                            was performed, and patient medications and                            allergies were reviewed. The patient's tolerance of                            previous anesthesia was also reviewed. The risks                            and benefits of the procedure and the sedation                            options and risks were discussed with the patient.                            All questions were answered, and informed consent                            was obtained. Prior Anticoagulants: The patient has                            taken Plavix (clopidogrel), last dose was 5 days                            prior to procedure. ASA Grade Assessment: III - A                            patient with severe systemic disease. After                            reviewing the risks and benefits, the patient was                            deemed in satisfactory condition to undergo the  procedure.                           After obtaining informed consent, the endoscope was                            passed under direct vision. Throughout the                            procedure, the patient's blood pressure, pulse, and                            oxygen saturations were monitored continuously. The                            Endoscope was introduced through the mouth, and   advanced to the second part of duodenum. The upper                            GI endoscopy was accomplished without difficulty.                            The patient tolerated the procedure well. Scope In: Scope Out: Findings:                 The examined esophagus was normal. No varcies noted.                           Multiple 3 to 6 mm sessile polyps with no bleeding                            and no stigmata of recent bleeding were found in                            the gastric fundus and in the gastric body.                            Biopsies were taken with a cold forceps for                            histology.                           A small hiatal hernia was present. No gastric                            varices noted.                           The exam of the stomach was otherwise normal.                           The duodenal bulb and second portion of the  duodenum were normal. Complications:            No immediate complications. Estimated Blood Loss:     Estimated blood loss was minimal. Impression:               - Normal esophagus.                           - Multiple gastric polyps. Biopsied.                           - Small hiatal hernia.                           - Normal duodenal bulb and second portion of the                            duodenum. Recommendation:           - Patient has a contact number available for                            emergencies. The signs and symptoms of potential                            delayed complications were discussed with the                            patient. Return to normal activities tomorrow.                            Written discharge instructions were provided to the                            patient.                           - Resume previous diet.                           - Continue present medications.                           - Resume Plavix (clopidogrel) at prior dose                             tomorrow. Refer to managing physician for further                            adjustment of therapy.                           - Await pathology results.                           - Repeat upper endoscopy in 3 years for screening  purposes.                           - Return to GI office in 2 months. Ladene Artist, MD 07/23/2017 10:53:50 AM This report has been signed electronically.

## 2017-07-23 NOTE — Progress Notes (Signed)
Report given to PACU, vss 

## 2017-07-23 NOTE — Progress Notes (Signed)
Called to room to assist during endoscopic procedure.  Patient ID and intended procedure confirmed with present staff. Received instructions for my participation in the procedure from the performing physician.  

## 2017-07-23 NOTE — Patient Instructions (Addendum)
Hiatal  Hernia (handout given) Gastric polyps  RESUME PLAVIX Thursday 07/24/17 at prior dose   YOU HAD AN ENDOSCOPIC PROCEDURE TODAY AT [facility]:   Refer to the procedure report that was given to you for any specific questions about what was found during the examination.  If the procedure report does not answer your questions, please call your gastroenterologist to clarify.  If you requested that your care partner not be given the details of your procedure findings, then the procedure report has been included in a sealed envelope for you to review at your convenience later.  YOU SHOULD EXPECT: Some feelings of bloating in the abdomen. Passage of more gas than usual.  Walking can help get rid of the air that was put into your GI tract during the procedure and reduce the bloating. If you had a lower endoscopy (such as a colonoscopy or flexible sigmoidoscopy) you may notice spotting of blood in your stool or on the toilet paper. If you underwent a bowel prep for your procedure, you may not have a normal bowel movement for a few days.  Please Note:  You might notice some irritation and congestion in your nose or some drainage.  This is from the oxygen used during your procedure.  There is no need for concern and it should clear up in a day or so.  SYMPTOMS TO REPORT IMMEDIATELY:   Following upper endoscopy (EGD)  Vomiting of blood or coffee ground material  New chest pain or pain under the shoulder blades  Painful or persistently difficult swallowing  New shortness of breath  Fever of 100F or higher  Black, tarry-looking stools  For urgent or emergent issues, a gastroenterologist can be reached at any hour by calling [phone].   DIET:  We do recommend a small meal at first, but then you may proceed to your regular diet.  Drink plenty of fluids but you should avoid alcoholic beverages for 24 hours.  ACTIVITY:  You should plan to take it easy for the rest of today and  you should NOT DRIVE or use heavy machinery until tomorrow (because of the sedation medicines used during the test).    FOLLOW UP: Our staff will call the number listed on your records the next business day following your procedure to check on you and address any questions or concerns that you may have regarding the information given to you following your procedure. If we do not reach you, we will leave a message.  However, if you are feeling well and you are not experiencing any problems, there is no need to return our call.  We will assume that you have returned to your regular daily activities without incident.  If any biopsies were taken you will be contacted by phone or by letter within the next 1-3 weeks.  Please call us at [phone] if you have not heard about the biopsies in 3 weeks.    SIGNATURES/CONFIDENTIALITY: You and/or your care partner have signed paperwork which will be entered into your electronic medical record.  These signatures attest to the fact that that the information above on your After Visit Summary has been reviewed and is understood.  Full responsibility of the confidentiality of this discharge information lies with you and/or your care-partner.

## 2017-07-24 ENCOUNTER — Telehealth: Payer: Self-pay

## 2017-07-24 NOTE — Telephone Encounter (Signed)
  Follow up Call-  Call back number 07/23/2017  Post procedure Call Back phone  # [phone]  Permission to leave phone message Yes  Some recent data might be hidden     Patient questions:  Do you have a fever, pain , or abdominal swelling? No. Pain Score  0 *  Have you tolerated food without any problems? Yes.    Have you been able to return to your normal activities? Yes.    Do you have any questions about your discharge instructions: Diet   No. Medications  No. Follow up visit  No.  Do you have questions or concerns about your Care? No.  Actions: * If pain score is 4 or above: No action needed, pain <4.

## 2017-07-29 ENCOUNTER — Encounter: Payer: Self-pay | Admitting: Gastroenterology

## 2017-08-01 ENCOUNTER — Ambulatory Visit: Payer: BC Managed Care – PPO | Admitting: Gastroenterology

## 2017-08-05 ENCOUNTER — Ambulatory Visit: Payer: BC Managed Care – PPO | Admitting: Nutrition

## 2017-08-12 ENCOUNTER — Ambulatory Visit: Payer: BC Managed Care – PPO | Admitting: Urology

## 2017-08-22 ENCOUNTER — Ambulatory Visit: Payer: BC Managed Care – PPO | Admitting: Cardiology

## 2017-08-22 ENCOUNTER — Encounter: Payer: Self-pay | Admitting: Cardiology

## 2017-08-22 VITALS — BP 120/78 | HR 86 | Ht 70.0 in | Wt 280.0 lb

## 2017-08-22 DIAGNOSIS — I10 Essential (primary) hypertension: Secondary | ICD-10-CM | POA: Diagnosis not present

## 2017-08-22 DIAGNOSIS — I2510 Atherosclerotic heart disease of native coronary artery without angina pectoris: Secondary | ICD-10-CM | POA: Diagnosis not present

## 2017-08-22 DIAGNOSIS — E785 Hyperlipidemia, unspecified: Secondary | ICD-10-CM | POA: Diagnosis not present

## 2017-08-22 NOTE — Progress Notes (Signed)
Cardiology Office Note:    Date:  08/22/2017   ID:  Colin Lee, DOB 11/08/1960, MRN [id number]  PCP:  [name], MD  Cardiologist:  Fransico Him, MD    Referring MD: Antionette Fairy, PA-C   Chief Complaint  Patient presents with  . Coronary Artery Disease  . Hyperlipidemia  . Hypertension    History of Present Illness:    Colin Lee is a 57 y.o. male with a hx of recurrent left pleural effusion and has had drainage and talc pleurodesis with Dr. [name]. He has a history of ASCAD with cath showing high grade proximal RCA stenosis that was treated with DES x 2. He had recurrent CP post PCI. Re-look cath demonstrated patent stents in the RCA. He has a history of moderate pleural effusion and community acquired pneumonia. He underwent thoracocentesis and was found to have bloody effusion with fluid analysis showing lymphocytes. ID and pulmonary consulted and suggested this could be rheumatologic in origin and rheumatology work up ordered and was negative.  No malignancy noted.   He is here today for followup and is doing well.  He denies any chest pain or pressure, PND, orthopnea, LE edema, dizziness, palpitations or syncope.  He does have chronic dyspnea on exertion when walking up inclines which is a chronic problem and is unchanged.  He is compliant with his meds and is tolerating meds with no SE.    Past Medical History:  Diagnosis Date  . Allergy   . Arthritis   . Asthma   . Coronary artery disease 2016   90% RCA and 30% LM S/P PCI of RCA  . GERD (gastroesophageal reflux disease)   . Headache    stress and tension  . History of nuclear stress test    Myoview 1/19: EF 61, no ischemia, low risk  . Hyperlipidemia   . Hypertension   . Neuromuscular disorder (HCC)    restless legs and leg cramps  . Shortness of breath    Starting May 2015  . Tubular adenoma of colon 03/2013    Past Surgical History:  Procedure Laterality Date  . CHEST TUBE  INSERTION Left 02/01/2014   Procedure: INSERTION PLEURAL DRAINAGE CATHETER;  Surgeon: Grace Isaac, MD;  Location: [facility];  Service: Thoracic;  Laterality: Left;  . COLONOSCOPY    . COLONOSCOPY W/ BIOPSIES AND POLYPECTOMY  2014   benign  . LEFT HEART CATHETERIZATION WITH CORONARY ANGIOGRAM N/A 06/07/2014   Procedure: LEFT HEART CATHETERIZATION WITH CORONARY ANGIOGRAM;  Surgeon: Peter M Martinique, MD;  Location: [facility] CATH LAB;  Service: Cardiovascular;  Laterality: N/A;  . LEFT HEART CATHETERIZATION WITH CORONARY ANGIOGRAM N/A 06/08/2014   Procedure: LEFT HEART CATHETERIZATION WITH CORONARY ANGIOGRAM;  Surgeon: Sinclair Grooms, MD;  Location: [facility] CATH LAB;  Service: Cardiovascular;  Laterality: N/A;  . no prior surgery    . PLEURAL BIOPSY Left 02/01/2014   Procedure: PLEURAL BIOPSY;  Surgeon: Grace Isaac, MD;  Location: [facility];  Service: Thoracic;  Laterality: Left;  . PLEURAL EFFUSION DRAINAGE Left 02/01/2014   Procedure: DRAINAGE OF PLEURAL EFFUSION;  Surgeon: Grace Isaac, MD;  Location: [facility];  Service: Thoracic;  Laterality: Left;  . REMOVAL OF PLEURAL DRAINAGE CATHETER Left 02/16/2014   Procedure: REMOVAL OF PLEURAL DRAINAGE CATHETER;  Surgeon: Grace Isaac, MD;  Location: [facility];  Service: Thoracic;  Laterality: Left;  . TALC PLEURODESIS Left 02/01/2014   Procedure: Pietro Cassis;  Surgeon: Grace Isaac, MD;  Location: [facility] OR;  Service: Thoracic;  Laterality: Left;  . THORACENTESIS Left 2015  . VIDEO ASSISTED THORACOSCOPY Left 02/01/2014   Procedure: VIDEO ASSISTED THORACOSCOPY;  Surgeon: Grace Isaac, MD;  Location: [facility];  Service: Thoracic;  Laterality: Left;  Marland Kitchen VIDEO BRONCHOSCOPY N/A 02/01/2014   Procedure: VIDEO BRONCHOSCOPY;  Surgeon: Grace Isaac, MD;  Location: [facility] OR;  Service: Thoracic;  Laterality: N/A;    Current Medications: Current Meds  Medication Sig  . acetaminophen (TYLENOL) 500 MG tablet Take 1,000 mg by mouth every 6 (six) hours as  needed for moderate pain.  Marland Kitchen aspirin EC 81 MG tablet Take 81 mg by mouth daily.  Marland Kitchen atorvastatin (LIPITOR) 40 MG tablet Take 1 tablet (40 mg total) by mouth daily at 6 PM.  . cetirizine (ZYRTEC) 10 MG tablet Take 10 mg by mouth daily.  . clopidogrel (PLAVIX) 75 MG tablet TAKE 1 TABLET BY MOUTH ONCE DAILY.  Marland Kitchen losartan (COZAAR) 100 MG tablet Take 1 tablet (100 mg total) by mouth daily.  . metoprolol tartrate (LOPRESSOR) 25 MG tablet Take 0.5 tablets (12.5 mg total) by mouth 2 (two) times daily.  . Multiple Vitamins-Minerals (CENTRUM ADULTS PO) Take 1 tablet by mouth daily.  . nitroGLYCERIN (NITROSTAT) 0.4 MG SL tablet Place 1 tablet (0.4 mg total) under the tongue every 5 (five) minutes as needed for chest pain.  . Omega-3 Fatty Acids (FISH OIL) 1000 MG CPDR Take 4,000 mg by mouth daily.  . pantoprazole (PROTONIX) 40 MG tablet TAKE 1 TABLET BY MOUTH ONCE DAILY.   Current Facility-Administered Medications for the 08/22/17 encounter (Office Visit) with Sueanne Margarita, MD  Medication  . 0.9 %  sodium chloride infusion     Allergies:   Patient has no known allergies.   Social History   Socioeconomic History  . Marital status: Married    Spouse name: Not on file  . Number of children: Not on file  . Years of education: Not on file  . Highest education level: Not on file  Occupational History  . Not on file  Social Needs  . Financial resource strain: Not on file  . Food insecurity:    Worry: Not on file    Inability: Not on file  . Transportation needs:    Medical: Not on file    Non-medical: Not on file  Tobacco Use  . Smoking status: Former Smoker    Packs/day: 1.00    Years: 30.00    Pack years: 30.00    Types: Cigarettes    Last attempt to quit: 04/08/2000    Years since quitting: 17.3  . Smokeless tobacco: Never Used  Substance and Sexual Activity  . Alcohol use: Yes    Comment: rare  . Drug use: No  . Sexual activity: Not on file  Lifestyle  . Physical activity:     Days per week: Not on file    Minutes per session: Not on file  . Stress: Not on file  Relationships  . Social connections:    Talks on phone: Not on file    Gets together: Not on file    Attends religious service: Not on file    Active member of club or organization: Not on file    Attends meetings of clubs or organizations: Not on file    Relationship status: Not on file  Other Topics Concern  . Not on file  Social History Narrative  . Not on file     Family  History: The patient's family history is negative for Colon cancer, Esophageal cancer, Rectal cancer, and Stomach cancer. He was adopted.  ROS:   Please see the history of present illness.    ROS  All other systems reviewed and negative.   EKGs/Labs/Other Studies Reviewed:    The following studies were reviewed today: none  EKG:  EKG is not ordered today.    Recent Labs: 06/16/2017: ALT 53; BUN 14; Creatinine, Ser 0.92; Hemoglobin 13.3; Platelets 234.0; Potassium 4.0; Sodium 138   Recent Lipid Panel    Component Value Date/Time   CHOL 97 (L) 02/09/2015 1253   TRIG 90 02/09/2015 1253   HDL 28 (L) 02/09/2015 1253   CHOLHDL 3.5 02/09/2015 1253   VLDL 18 02/09/2015 1253   LDLCALC 51 02/09/2015 1253    Physical Exam:    VS:  BP 120/78   Pulse 86   Ht 5\' 10"  (1.778 m)   Wt 280 lb (127 kg)   SpO2 95%   BMI 40.18 kg/m     Wt Readings from Last 3 Encounters:  08/22/17 280 lb (127 kg)  07/23/17 282 lb (127.9 kg)  06/16/17 282 lb (127.9 kg)     GEN:  Well nourished, well developed in no acute distress HEENT: Normal NECK: No JVD; No carotid bruits LYMPHATICS: No lymphadenopathy CARDIAC: RRR, no murmurs, rubs, gallops RESPIRATORY:  Clear to auscultation without rales, wheezing or rhonchi  ABDOMEN: Soft, non-tender, non-distended MUSCULOSKELETAL:  No edema; No deformity  SKIN: Warm and dry NEUROLOGIC:  Alert and oriented x 3 PSYCHIATRIC:  Normal affect   ASSESSMENT:    1. Coronary artery disease  involving native coronary artery of native heart without angina pectoris   2. Essential hypertension   3. Dyslipidemia    PLAN:    In order of problems listed above:  1.  ASCAD- cath in 2016 showed high grade proximal RCA stenosis that was treated with DES x 2. He had recurrent CP post PCI. Re-look cath demonstrated patent stents in the RCA.  He has not had any further anginal symptoms.  He will continue on aspirin 81 mg daily, Plavix 75 mg daily and Lopressor 12.5 mg twice daily.  He will also continue on statin therapy.  2.  HTN - his blood pressure is well controlled on exam today.  He will continue on losartan 100 mg daily and Lopressor 12.5 mg twice daily.  His creatinine was stable at 0.92 on 06/16/2017.  3.  Hyperlipidemia -LDL goal is less than 70.  His LDL was 51 on 02/09/2015.  I do not have a recent fasting lipid panel on him so I will get an FLP and ALT.  He will continue on atorvastatin 40 mg daily.   Medication Adjustments/Labs and Tests Ordered: Current medicines are reviewed at length with the patient today.  Concerns regarding medicines are outlined above.  No orders of the defined types were placed in this encounter.  No orders of the defined types were placed in this encounter.   Signed, Fransico Him, MD  08/22/2017 4:37 PM    [place]

## 2017-08-22 NOTE — Patient Instructions (Addendum)
Medication Instructions:  Your physician recommends that you continue on your current medications as directed. Please refer to the Current Medication list given to you today.  If you need a refill on your cardiac medications, please contact your pharmacy first.  Labwork: Your physician recommends that you return for lab work in 1 week for fasting lipid panel and liver function test.    Testing/Procedures: None ordered   Follow-Up: Your physician wants you to follow-up in: 1 year with Dr. Radford Pax. You will receive a reminder letter in the mail two months in advance. If you don't receive a letter, please call our office to schedule the follow-up appointment.  Any Other Special Instructions Will Be Listed Below (If Applicable).   Thank you for choosing [facility], RN  [phone]  If you need a refill on your cardiac medications before your next appointment, please call your pharmacy.

## 2017-09-05 ENCOUNTER — Other Ambulatory Visit: Payer: Self-pay | Admitting: Cardiology

## 2017-09-08 LAB — LIPID PANEL
Cholesterol: 89 mg/dL (ref <200)
HDL: 29 mg/dL — ABNORMAL LOW (ref >40)
LDL Cholesterol (Calc): 46 mg/dL (calc)
Non-HDL Cholesterol (Calc): 60 mg/dL (calc) (ref <130)
Total CHOL/HDL Ratio: 3.1 (calc) (ref <5.0)
Triglycerides: 67 mg/dL (ref <150)

## 2017-09-08 LAB — HEPATIC FUNCTION PANEL
AG Ratio: 1.4 (calc) (ref 1.0–2.5)
ALT: 35 U/L (ref 9–46)
AST: 28 U/L (ref 10–35)
Albumin: 3.7 g/dL (ref 3.6–5.1)
Alkaline phosphatase (APISO): 72 U/L (ref 40–115)
Bilirubin, Direct: 0.1 mg/dL (ref 0.0–0.2)
Globulin: 2.6 g/dL (calc) (ref 1.9–3.7)
Indirect Bilirubin: 0.3 mg/dL (calc) (ref 0.2–1.2)
Total Bilirubin: 0.4 mg/dL (ref 0.2–1.2)
Total Protein: 6.3 g/dL (ref 6.1–8.1)

## 2017-09-16 ENCOUNTER — Ambulatory Visit: Payer: BC Managed Care – PPO | Admitting: Nutrition

## 2017-11-11 ENCOUNTER — Encounter: Payer: BC Managed Care – PPO | Attending: Emergency Medicine | Admitting: Nutrition

## 2017-11-11 DIAGNOSIS — K7460 Unspecified cirrhosis of liver: Secondary | ICD-10-CM | POA: Diagnosis not present

## 2017-11-11 DIAGNOSIS — Z7901 Long term (current) use of anticoagulants: Secondary | ICD-10-CM | POA: Diagnosis not present

## 2017-11-11 DIAGNOSIS — K760 Fatty (change of) liver, not elsewhere classified: Secondary | ICD-10-CM

## 2017-11-11 DIAGNOSIS — Z713 Dietary counseling and surveillance: Secondary | ICD-10-CM | POA: Diagnosis not present

## 2017-11-11 DIAGNOSIS — R102 Pelvic and perineal pain: Secondary | ICD-10-CM | POA: Diagnosis not present

## 2017-11-11 NOTE — Progress Notes (Signed)
  Medical Nutrition Therapy:  Appt start time: 8127 end time 1630.   Assessment:  Primary concerns today: Overweigh and Fatty Liver.t. 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 Has a fatty liver. HD MD Fransico Him, In [place]. GI Dr> Kennedy Bucker.   Eats 3 meals per day. Physical activity: ADL. Join  [employer].  Wants to lose 1 lb per week. Current diet is high in processed food, high in fat, sodium and low in fresh fruits and vegetables. Diet is low in fiber. CMP Latest Ref Rng & Units 09/05/2017 06/16/2017 04/28/2017  Glucose 70 - 99 mg/dL - 101(H) 94  BUN 6 - 23 mg/dL - 14 15  Creatinine 0.40 - 1.50 mg/dL - 0.92 1.02  Sodium 135 - 145 mEq/L - 138 143  Potassium 3.5 - 5.1 mEq/L - 4.0 4.2  Chloride 96 - 112 mEq/L - 104 103  CO2 19 - 32 mEq/L - 26 24  Calcium 8.4 - 10.5 mg/dL - 9.7 9.6  Total Protein 6.1 - 8.1 g/dL 6.3 7.5 -  Total Bilirubin 0.2 - 1.2 mg/dL 0.4 0.3 -  Alkaline Phos 39 - 117 U/L - 65 -  AST 10 - 35 U/L 28 35 -  ALT 9 - 46 U/L 35 53 -   Lipid Panel     Component Value Date/Time   CHOL 89 09/05/2017 0824   TRIG 67 09/05/2017 0824   HDL 29 (L) 09/05/2017 0824   CHOLHDL 3.1 09/05/2017 0824   VLDL 18 02/09/2015 1253   LDLCALC 46 09/05/2017 0824     Preferred Learning Style:   No preference indicated   Learning Readiness:   Ready  Change in progress   MEDICATIONS: see list   DIETARY INTAKE:  24-hr recall:  B ( AM): Egg beaters and 3 sausage links  Snk ( AM):   L ( PM): cornbread, 2 devlied eggs, beef staugoff and cheese pie and water Snk ( PM): D ( PM): Hot Dogs 1, french fries, SUndelight Snk ( PM):  Beverages: Water, mostl  Usual physical activity: walk a lot at work  Estimated energy needs: 1800  calories 200  g carbohydrates 135 g protein 50 g fat  Progress Towards Goal(s):  In progress.   Nutritional Diagnosis:  NB-1.1 Food and nutrition-related knowledge  deficit As related to Obesity .  As evidenced by BMI > 30, CAD with heart stents.    Intervention:  Nutrition and Diabetes education provided on My Plate, CHO counting, meal planning, portion sizes, timing of meals, avoiding snacks between meals unless having a low blood sugar, target ranges for A1C and blood sugars, signs/symptoms and treatment of hyper/hypoglycemia, monitoring blood sugars, taking medications as prescribed, benefits of exercising 30 minutes per day and prevention of complications of DM. Marland KitchenGoals 1. Follow My Plate 2. Cut out sausage 3. Increase lower carb vegetables and fresh fruits 4. Exercise 30 minutes 2 twice a week Lose 1 lb per week Avoid fast foods and processed foods.  Teaching Method Utilized: none Visual Auditory Hands on  Handouts given during visit include:  The Plate Method  Meal Plan Card  HIgh Fiber Diet   Barriers to learning/adherence to lifestyle change: none  Demonstrated degree of understanding via:  Teach Back   Monitoring/Evaluation:  Dietary intake, exercise, meal planning , and body weight in 1 month(s).

## 2017-11-11 NOTE — Patient Instructions (Signed)
Goals 1. Follow My Plate 2. Cut out sausage 3. Increase lower carb vegetables and fresh fruits 4. Exercise 30 minutes 2 twice a week Lose 1 lb per week Avoid fast foods and processed foods.

## 2017-12-03 ENCOUNTER — Encounter: Payer: Self-pay | Admitting: Nutrition

## 2018-01-19 ENCOUNTER — Encounter: Payer: BC Managed Care – PPO | Attending: Family | Admitting: Nutrition

## 2018-01-19 VITALS — Ht 70.0 in | Wt 268.0 lb

## 2018-01-19 DIAGNOSIS — E669 Obesity, unspecified: Secondary | ICD-10-CM

## 2018-01-19 NOTE — Patient Instructions (Addendum)
Goals 1. Increase fresh fruits and vegetables. 2. Lose 1 lb per week 3. Add high fiber cereal with breakfast  4. Keep walking.

## 2018-01-19 NOTE — Progress Notes (Signed)
Medical Nutrition Therapy:  Appt start time: 1630 end time 1700   Assessment:  Primary concerns today: Overweigh and Fatty Liver and some heart related issues.  He lives with is wife and both cook and shop. 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 Has a fatty liver. HD MD Fransico Him, In [place]. GI Dr> Kennedy Bucker.  Walking some in woods.Trying to get more exercise. Lost 6 lbs.Cut out sausage Has cut out eating before bed and snacks. Has cut out ice cream. Feeling better. Labs from PCP 01/01/18   HDL L 26,  Liver enzymes WNL per labs provided. ALK PHos, AST and ALT. Vitals with BMI 01/19/2018 11/11/2017  Height '5\' 10"'  '5\' 10"'   Weight 268 lbs 274 lbs  BMI 32.95 18.84  Systolic    Diastolic    Pulse    Respirations       CMP Latest Ref Rng & Units 09/05/2017 06/16/2017 04/28/2017  Glucose 70 - 99 mg/dL - 101(H) 94  BUN 6 - 23 mg/dL - 14 15  Creatinine 0.40 - 1.50 mg/dL - 0.92 1.02  Sodium 135 - 145 mEq/L - 138 143  Potassium 3.5 - 5.1 mEq/L - 4.0 4.2  Chloride 96 - 112 mEq/L - 104 103  CO2 19 - 32 mEq/L - 26 24  Calcium 8.4 - 10.5 mg/dL - 9.7 9.6  Total Protein 6.1 - 8.1 g/dL 6.3 7.5 -  Total Bilirubin 0.2 - 1.2 mg/dL 0.4 0.3 -  Alkaline Phos 39 - 117 U/L - 65 -  AST 10 - 35 U/L 28 35 -  ALT 9 - 46 U/L 35 53 -   Lipid Panel     Component Value Date/Time   CHOL 89 09/05/2017 0824   TRIG 67 09/05/2017 0824   HDL 29 (L) 09/05/2017 0824   CHOLHDL 3.1 09/05/2017 0824   VLDL 18 02/09/2015 1253   LDLCALC 46 09/05/2017 0824     Preferred Learning Style:   No preference indicated   Learning Readiness:   Ready  Change in progress   MEDICATIONS: see list   DIETARY INTAKE:  24-hr recall:  B ( AM): Egg beaters,  With cheese, Coffee. Snk ( AM):  fruit L ( PM): Pasta chicken, yougrt, water Snk ( PM): D ( PM):  Same as lunch, water Snk ( PM):  Beverages: Water,  Usual physical activity: walk a lot at work  Estimated energy needs: 1800   calories 200  g carbohydrates 135 g protein 50 g fat  Progress Towards Goal(s):  In progress.   Nutritional Diagnosis:  NB-1.1 Food and nutrition-related knowledge deficit As related to Obesity .  As evidenced by BMI > 30, CAD with heart stents.    Intervention:  Nutrition and Diabetes education provided on My Plate, CHO counting, meal planning, portion sizes, timing of meals, avoiding snacks between meals unless having a low blood sugar, target ranges for A1C and blood sugars, signs/symptoms and treatment of hyper/hypoglycemia, monitoring blood sugars, taking medications as prescribed, benefits of exercising 30 minutes per day and prevention of complications of DM.   Goals 1. Increase fresh fruits and vegetables. 2. Lose 1 lb per week 3. Add high fiber cereal with breakfast  4. Keep walking.   Teaching Method Utilized: none Visual Auditory Hands on  Handouts given during visit include:  The Plate Method  Meal Plan Card  HIgh Fiber Diet   Barriers to learning/adherence to lifestyle change: none  Demonstrated degree of understanding via:  Teach Back   Monitoring/Evaluation:  Dietary intake, exercise, meal planning , and body weight in 3-4  month(s).

## 2018-01-28 ENCOUNTER — Encounter: Payer: Self-pay | Admitting: Nutrition

## 2018-03-27 ENCOUNTER — Other Ambulatory Visit: Payer: Self-pay | Admitting: Cardiology

## 2018-03-27 ENCOUNTER — Other Ambulatory Visit: Payer: Self-pay | Admitting: Physician Assistant

## 2018-03-31 ENCOUNTER — Ambulatory Visit (INDEPENDENT_AMBULATORY_CARE_PROVIDER_SITE_OTHER): Payer: BC Managed Care – PPO | Admitting: Physician Assistant

## 2018-03-31 ENCOUNTER — Encounter: Payer: Self-pay | Admitting: Physician Assistant

## 2018-03-31 ENCOUNTER — Encounter: Payer: Self-pay | Admitting: Gastroenterology

## 2018-03-31 ENCOUNTER — Telehealth: Payer: Self-pay

## 2018-03-31 VITALS — BP 122/78 | HR 70 | Ht 70.0 in | Wt 277.1 lb

## 2018-03-31 DIAGNOSIS — Z01818 Encounter for other preprocedural examination: Secondary | ICD-10-CM

## 2018-03-31 DIAGNOSIS — Z7901 Long term (current) use of anticoagulants: Secondary | ICD-10-CM

## 2018-03-31 DIAGNOSIS — Z86010 Personal history of colonic polyps: Secondary | ICD-10-CM

## 2018-03-31 MED ORDER — NA SULFATE-K SULFATE-MG SULF 17.5-3.13-1.6 GM/177ML PO SOLN: ORAL | 0 refills | Status: DC

## 2018-03-31 NOTE — Progress Notes (Signed)
Reviewed and agree with management plan.  Horacio Werth T. Lanasia Porras, MD FACG 

## 2018-03-31 NOTE — Telephone Encounter (Signed)
Routing to Dr. Radford Pax regarding holding of Plavix for 5 days.   Dr. Radford Pax, please route your response back to CV Fouke, RN, [place] [address] [place], [place]  [phone]

## 2018-03-31 NOTE — Telephone Encounter (Signed)
[facility] Pre-operative Risk Assessment     Request for surgical clearance:     Endoscopy Procedure  What type of surgery is being performed?     Colonoscopy  When is this surgery scheduled?     04/10/18  What type of clearance is required ?   Pharmacy  Are there any medications that need to be held prior to surgery and how long? HOLD PLAVIX FOR 5 DAYS PRIOR  Practice name and name of physician performing surgery?      [facility]/Dr. Fuller Plan  What is your office phone and fax number?      Phone- [phone]  [phone]  Anesthesia type (None, local, MAC, general) ?       MAC

## 2018-03-31 NOTE — Progress Notes (Signed)
Chief Complaint: Preprocedural exam for a surveillance colonoscopy  HPI:    Colin Lee is a 57 year old Caucasian male with a past medical history as listed below including CAD maintained on Plavix (05/26/2014 echo with an LVEF 55-60%), known to Dr. Fuller Plan, who was referred to me by Colin Schwalbe, MD for a preprocedural exam for a surveillance colonoscopy.      03/30/2013 colonoscopy with Dr. Fuller Plan with a sessile polyp measuring 6 mm in the transverse colon, 2 sessile polyps measuring 6-7 mm in the sigmoid colon, 2 sessile polyps measuring 3-4 mm in the sigmoid colon and mild diverticulosis in the transverse colon.  Pathology showed mixture of tubular adenoma, sessile serrated and hyperplastic polyps.  Repeat colonoscopy was recommended in 5 years.    Today, the patient presents clinic and explains that he is doing well.  He has had no acute health changes recently.  Patient is aware that he is due for a colonoscopy given his history of adenomatous polyps.  He was also had to hold his Plavix before and is aware of how to do that.    Denies fever, chills, weight loss, anorexia, nausea, vomiting, heartburn, reflux or symptoms that awaken him from sleep.  Past Medical History:  Diagnosis Date  . Allergy   . Arthritis   . Asthma   . Coronary artery disease 2016   90% RCA and 30% LM S/P PCI of RCA  . GERD (gastroesophageal reflux disease)   . Headache    stress and tension  . History of nuclear stress test    Myoview 1/19: EF 61, no ischemia, low risk  . Hyperlipidemia   . Hypertension   . Neuromuscular disorder (HCC)    restless legs and leg cramps  . Shortness of breath    Starting May 2015  . Tubular adenoma of colon 03/2013    Past Surgical History:  Procedure Laterality Date  . CHEST TUBE INSERTION Left 02/01/2014   Procedure: INSERTION PLEURAL DRAINAGE CATHETER;  Surgeon: Grace Isaac, MD;  Location: [facility];  Service: Thoracic;  Laterality: Left;  . COLONOSCOPY    .  COLONOSCOPY W/ BIOPSIES AND POLYPECTOMY  2014   benign  . LEFT HEART CATHETERIZATION WITH CORONARY ANGIOGRAM N/A 06/07/2014   Procedure: LEFT HEART CATHETERIZATION WITH CORONARY ANGIOGRAM;  Surgeon: Peter M Martinique, MD;  Location[facility] C[facility] LAB;  Service: Cardiovascular;  Laterality: N/A;  . LEFT HEART CATHETERIZATION WITH CORONARY ANGIOGRAM N/A 06/08/2014   Procedure: LEFT HEART CATHETERIZATION WITH CORONARY ANGIOGRAM;  Surgeon: Sinclair Grooms, MD;  Location[facility] C[facility] LAB;  Service: Cardiovascular;  Laterality: N/A;  . no prior surgery    . PLEURAL BIOPSY Left 02/01/2014   Procedure: PLEURAL BIOPSY;  Surgeon: Grace Isaac, MD;  Location: [place];  Service: Thoracic;  Laterality: Left;  . PLEURAL EFFUSION DRAINAGE Left 02/01/2014   Procedure: DRAINAGE OF PLEURAL EFFUSION;  Surgeon: Grace Isaac, MD;  Location: [place];  Service: Thoracic;  Laterality: Left;  . REMOVAL OF PLEURAL DRAINAGE CATHETER Left 02/16/2014   Procedure: REMOVAL OF PLEURAL DRAINAGE CATHETER;  Surgeon: Grace Isaac, MD;  Location: [place];  Service: Thoracic;  Laterality: Left;  . TALC PLEURODESIS Left 02/01/2014   Procedure: Pietro Cassis;  Surgeon: Grace Isaac, MD;  Location: [place];  Service: Thoracic;  Laterality: Left;  . THORACENTESIS Left 2015  . VIDEO ASSISTED THORACOSCOPY Left 02/01/2014   Procedure: VIDEO ASSISTED THORACOSCOPY;  Surgeon: Grace Isaac, MD;  Location: [place];  Service: Thoracic;  Laterality: Left;  Marland Kitchen VIDEO BRONCHOSCOPY N/A 02/01/2014   Procedure: VIDEO BRONCHOSCOPY;  Surgeon: Grace Isaac, MD;  Location[facility] OR;  Service: Thoracic;  Laterality: N/A;    Current Outpatient Medications  Medication Sig Dispense Refill  . acetaminophen (TYLENOL) 500 MG tablet Take 1,000 mg by mouth every 6 (six) hours as needed for moderate pain.    Marland Kitchen aspirin EC 81 MG tablet Take 81 mg by mouth daily.    Marland Kitchen atorvastatin (LIPITOR) 40 MG tablet Take 1 tablet (40 mg total) by mouth daily at 6 PM. 30  tablet 0  . cetirizine (ZYRTEC) 10 MG tablet Take 10 mg by mouth daily.    . clopidogrel (PLAVIX) 75 MG tablet TAKE 1 TABLET BY MOUTH ONCE DAILY. 30 tablet 4  . losartan (COZAAR) 100 MG tablet TAKE 1 TABLET BY MOUTH ONCE DAILY. 30 tablet 4  . metoprolol tartrate (LOPRESSOR) 25 MG tablet Take 0.5 tablets (12.5 mg total) by mouth 2 (two) times daily. 60 tablet 0  . Multiple Vitamins-Minerals (CENTRUM ADULTS PO) Take 1 tablet by mouth daily.    . nitroGLYCERIN (NITROSTAT) 0.4 MG SL tablet Place 1 tablet (0.4 mg total) under the tongue every 5 (five) minutes as needed for chest pain. 25 tablet 3  . Omega-3 Fatty Acids (FISH OIL) 1000 MG CPDR Take 4,000 mg by mouth daily.    . pantoprazole (PROTONIX) 40 MG tablet TAKE 1 TABLET BY MOUTH ONCE DAILY. 30 tablet 4   No current facility-administered medications for this visit.     Allergies as of 03/31/2018  . (No Known Allergies)    Family History  Adopted: Yes  Problem Relation Age of Onset  . Colon cancer Neg Hx   . Esophageal cancer Neg Hx   . Rectal cancer Neg Hx   . Stomach cancer Neg Hx     Social History   Socioeconomic History  . Marital status: Married    Spouse name: Not on file  . Number of children: Not on file  . Years of education: Not on file  . Highest education level: Not on file  Occupational History  . Not on file  Social Needs  . Financial resource strain: Not on file  . Food insecurity:    Worry: Not on file    Inability: Not on file  . Transportation needs:    Medical: Not on file    Non-medical: Not on file  Tobacco Use  . Smoking status: Former Smoker    Packs/day: 1.00    Years: 30.00    Pack years: 30.00    Types: Cigarettes    Last attempt to quit: 04/08/2000    Years since quitting: 17.9  . Smokeless tobacco: Never Used  Substance and Sexual Activity  . Alcohol use: Yes    Comment: rare  . Drug use: No  . Sexual activity: Not on file  Lifestyle  . Physical activity:    Days per week: Not on  file    Minutes per session: Not on file  . Stress: Not on file  Relationships  . Social connections:    Talks on phone: Not on file    Gets together: Not on file    Attends religious service: Not on file    Active member of club or organization: Not on file    Attends meetings of clubs or organizations: Not on file    Relationship status: Not on file  . Intimate partner violence:  Fear of current or ex partner: Not on file    Emotionally abused: Not on file    Physically abused: Not on file    Forced sexual activity: Not on file  Other Topics Concern  . Not on file  Social History Narrative  . Not on file    Review of Systems:    Constitutional: No weight loss, fever or chills Cardiovascular: No chest pain Respiratory: No SOB  Gastrointestinal: See HPI and otherwise negative   Physical Exam:  Vital signs: BP 122/78   Pulse 70   Ht 5\' 10"  (1.778 m)   Wt 277 lb 2 oz (125.7 kg)   BMI 39.76 kg/m   Constitutional:   Pleasant Caucasian male appears to be in NAD, Well developed, Well nourished, alert and cooperative Respiratory: Respirations even and unlabored. Lungs clear to auscultation bilaterally.   No wheezes, crackles, or rhonchi.  Cardiovascular: Normal S1, S2. No MRG. Regular rate and rhythm. No peripheral edema, cyanosis or pallor.  Gastrointestinal:  Soft, nondistended, nontender. No rebound or guarding. Normal bowel sounds. No appreciable masses or hepatomegaly. Psychiatric:  Demonstrates good judgement and reason without abnormal affect or behaviors.  No recent labs or imaging.  Assessment: 1.  History of adenomatous colon polyps: Last colonoscopy in 2014, recommendations repeat in 5 years  2.  Chronic anticoagulation: With Plavix for CAD  Plan: 1.  Scheduled patient for surveillance colonoscopy in the [place] with Dr. Fuller Plan due to his history of adenomatous polyps.  Did discuss risks, benefits, limitations and alternatives and the patient agrees to proceed. 2.   Patient was advised to hold his Plavix for 5 days prior to time of procedure.  We will contact Dr. Radford Pax to ensure that holding his Plavix is acceptable for him. 3.  Patient to follow in clinic per recommendations from Dr. Fuller Plan after time of procedure.  Ellouise Newer, PA-C [facility] 03/31/2018, 9:02 AM  Cc: Colin Schwalbe, MD

## 2018-03-31 NOTE — Patient Instructions (Signed)
If you are age 55 or older, your body mass index should be between 23-30. Your Body mass index is 39.76 kg/m. If this is out of the aforementioned range listed, please consider follow up with your Primary Care Provider.  If you are age 14 or younger, your body mass index should be between 19-25. Your Body mass index is 39.76 kg/m. If this is out of the aformentioned range listed, please consider follow up with your Primary Care Provider.   You have been scheduled for a colonoscopy. Please follow written instructions given to you at your visit today.  Please pick up your prep supplies at the pharmacy within the next 1-3 days. If you use inhalers (even only as needed), please bring them with you on the day of your procedure. Your physician has requested that you go to www.startemmi.com and enter the access code given to you at your visit today. This web site gives a general overview about your procedure. However, you should still follow specific instructions given to you by our office regarding your preparation for the procedure.  We have sent the following medications to your pharmacy for you to pick up at your convenience: [place] will be contacted by our office prior to your procedure for directions on holding your Plavix.  If you do not hear from our office 1 week prior to your scheduled procedure, please call [phone] to discuss.   Thank you for choosing me and [facility].    Ellouise Newer, PA-C

## 2018-04-02 NOTE — Telephone Encounter (Signed)
Called and spoke to pt.  Let him know he should hold Plavix starting on Sunday, 04-05-18 for procedure on 04-10-18. Pt expressed understanding.

## 2018-04-02 NOTE — Telephone Encounter (Signed)
Yes, he can hold plavix 5 days prior to the endoscopy and restart as soon as acceptable from GI standpoint.

## 2018-04-10 ENCOUNTER — Ambulatory Visit (AMBULATORY_SURGERY_CENTER): Payer: BC Managed Care – PPO | Admitting: Gastroenterology

## 2018-04-10 ENCOUNTER — Encounter: Payer: Self-pay | Admitting: Gastroenterology

## 2018-04-10 VITALS — BP 110/64 | HR 65 | Temp 98.0°F | Resp 11 | Ht 70.0 in | Wt 277.0 lb

## 2018-04-10 DIAGNOSIS — Z860101 Personal history of adenomatous and serrated colon polyps: Secondary | ICD-10-CM

## 2018-04-10 DIAGNOSIS — K635 Polyp of colon: Secondary | ICD-10-CM

## 2018-04-10 DIAGNOSIS — Z86010 Personal history of colonic polyps: Secondary | ICD-10-CM

## 2018-04-10 DIAGNOSIS — D123 Benign neoplasm of transverse colon: Secondary | ICD-10-CM

## 2018-04-10 DIAGNOSIS — D125 Benign neoplasm of sigmoid colon: Secondary | ICD-10-CM

## 2018-04-10 MED ORDER — SODIUM CHLORIDE 0.9 % IV SOLN: 500.0000 mL | Freq: Once | INTRAVENOUS | Status: DC

## 2018-04-10 NOTE — Patient Instructions (Signed)
Information on polyps, diverticulosis and hemorrhoids and high fiber diet given to you today.  Await pathology results.  Resume Plavix tomorrow at prior dose.  YOU HAD AN ENDOSCOPIC PROCEDURE TODAY AT [facility]:   Refer to the procedure report that was given to you for any specific questions about what was found during the examination.  If the procedure report does not answer your questions, please call your gastroenterologist to clarify.  If you requested that your care partner not be given the details of your procedure findings, then the procedure report has been included in a sealed envelope for you to review at your convenience later.  YOU SHOULD EXPECT: Some feelings of bloating in the abdomen. Passage of more gas than usual.  Walking can help get rid of the air that was put into your GI tract during the procedure and reduce the bloating. If you had a lower endoscopy (such as a colonoscopy or flexible sigmoidoscopy) you may notice spotting of blood in your stool or on the toilet paper. If you underwent a bowel prep for your procedure, you may not have a normal bowel movement for a few days.  Please Note:  You might notice some irritation and congestion in your nose or some drainage.  This is from the oxygen used during your procedure.  There is no need for concern and it should clear up in a day or so.  SYMPTOMS TO REPORT IMMEDIATELY:   Following lower endoscopy (colonoscopy or flexible sigmoidoscopy):  Excessive amounts of blood in the stool  Significant tenderness or worsening of abdominal pains  Swelling of the abdomen that is new, acute  Fever of 100F or higher  For urgent or emergent issues, a gastroenterologist can be reached at any hour by calling [phone].   DIET:  We do recommend a small meal at first, but then you may proceed to your regular diet.  Drink plenty of fluids but you should avoid alcoholic beverages for 24 hours.  ACTIVITY:  You should  plan to take it easy for the rest of today and you should NOT DRIVE or use heavy machinery until tomorrow (because of the sedation medicines used during the test).    FOLLOW UP: Our staff will call the number listed on your records the next business day following your procedure to check on you and address any questions or concerns that you may have regarding the information given to you following your procedure. If we do not reach you, we will leave a message.  However, if you are feeling well and you are not experiencing any problems, there is no need to return our call.  We will assume that you have returned to your regular daily activities without incident.  If any biopsies were taken you will be contacted by phone or by letter within the next 1-3 weeks.  Please call us at [phone] if you have not heard about the biopsies in 3 weeks.    SIGNATURES/CONFIDENTIALITY: You and/or your care partner have signed paperwork which will be entered into your electronic medical record.  These signatures attest to the fact that that the information above on your After Visit Summary has been reviewed and is understood.  Full responsibility of the confidentiality of this discharge information lies with you and/or your care-partner.

## 2018-04-10 NOTE — Progress Notes (Signed)
Pt's states no medical or surgical changes since previsit or office visit. 

## 2018-04-10 NOTE — Op Note (Signed)
[facility] Patient Name: Colin Lee Procedure Date: 04/10/2018 10:17 AM MRN: [id number] Endoscopist: Ladene Artist , MD Age: 58 Referring MD:  Date of Birth: 10-Oct-1960 Gender: Male Account #: [id number] Procedure:                Colonoscopy Indications:              Surveillance: Personal history of adenomatous                            polyps on last colonoscopy 5 years ago Medicines:                Monitored Anesthesia Care Procedure:                Pre-Anesthesia Assessment:                           - Prior to the procedure, a History and Physical                            was performed, and patient medications and                            allergies were reviewed. The patient's tolerance of                            previous anesthesia was also reviewed. The risks                            and benefits of the procedure and the sedation                            options and risks were discussed with the patient.                            All questions were answered, and informed consent                            was obtained. Prior Anticoagulants: The patient has                            taken Plavix (clopidogrel), last dose was 5 days                            prior to procedure. ASA Grade Assessment: III - A                            patient with severe systemic disease. After                            reviewing the risks and benefits, the patient was                            deemed in satisfactory condition to undergo the  procedure.                           After obtaining informed consent, the colonoscope                            was passed under direct vision. Throughout the                            procedure, the patient's blood pressure, pulse, and                            oxygen saturations were monitored continuously. The                            Colonoscope was introduced through the anus and                      advanced to the the cecum, identified by                            appendiceal orifice and ileocecal valve. The                            ileocecal valve, appendiceal orifice, and rectum                            were photographed. The quality of the bowel                            preparation was good. The colonoscopy was performed                            without difficulty. The patient tolerated the                            procedure well. Scope In: 10:30:35 AM Scope Out: 10:48:15 AM Scope Withdrawal Time: 0 hours 15 minutes 49 seconds  Total Procedure Duration: 0 hours 17 minutes 40 seconds  Findings:                 The perianal and digital rectal examinations were                            normal.                           Three sessile polyps were found in the sigmoid                            colon, transverse colon and hepatic flexure. The                            polyps were 5 to 7 mm in size. These polyps were  removed with a cold snare. Resection and retrieval                            were complete.                           Multiple medium-mouthed diverticula were found in                            the entire colon. There was no evidence of                            diverticular bleeding.                           Internal hemorrhoids were found during                            retroflexion. The hemorrhoids were medium-sized and                            Grade I (internal hemorrhoids that do not prolapse).                           The exam was otherwise without abnormality on                            direct and retroflexion views. Complications:            No immediate complications. Estimated blood loss:                            None. Estimated Blood Loss:     Estimated blood loss: none. Impression:               - Three 5 to 7 mm polyps in the sigmoid colon, in                            the transverse  colon and at the hepatic flexure,                            removed with a cold snare. Resected and retrieved.                           - Mild diverticulosis in the entire examined colon.                            There was no evidence of diverticular bleeding.                           - Internal hemorrhoids.                           - The examination was otherwise normal on direct  and retroflexion views. Recommendation:           - Repeat colonoscopy in 5 years for surveillance.                           - Resume Plavix (clopidogrel) tomorrow at prior                            dose. Refer to managing physician for further                            adjustment of therapy.                           - Patient has a contact number available for                            emergencies. The signs and symptoms of potential                            delayed complications were discussed with the                            patient. Return to normal activities tomorrow.                            Written discharge instructions were provided to the                            patient.                           - High fiber diet.                           - Continue present medications.                           - Await pathology results. Ladene Artist, MD 04/10/2018 10:51:59 AM This report has been signed electronically.

## 2018-04-10 NOTE — Progress Notes (Signed)
Called to room to assist during endoscopic procedure.  Patient ID and intended procedure confirmed with present staff. Received instructions for my participation in the procedure from the performing physician.  

## 2018-04-10 NOTE — Progress Notes (Signed)
PT taken to PACU. Monitors in place. VSS. Report given to RN. 

## 2018-04-13 ENCOUNTER — Telehealth: Payer: Self-pay

## 2018-04-13 NOTE — Telephone Encounter (Signed)
Left message on f/u call 

## 2018-04-13 NOTE — Telephone Encounter (Signed)
  Follow up Call-  Call back number 04/10/2018 07/23/2017  Post procedure Call Back phone  # [phone]  Permission to leave phone message Yes Yes  Some recent data might be hidden     Patient questions:  Do you have a fever, pain , or abdominal swelling? No Pain Score 0  Have you tolerated food without any problems? Yes  Have you been able to return to your normal activities? Yes  Do you have any questions about your discharge instructions: Diet   No Medications  No Follow up visit  No  Do you have questions or concerns about your Care? No  Actions: * If pain score is 4 or above: No action needed, pain <4

## 2018-04-17 ENCOUNTER — Encounter: Payer: Self-pay | Admitting: Gastroenterology

## 2018-07-22 ENCOUNTER — Ambulatory Visit: Payer: BC Managed Care – PPO | Admitting: Nutrition

## 2018-08-14 ENCOUNTER — Telehealth: Payer: Self-pay | Admitting: Cardiology

## 2018-08-14 NOTE — Telephone Encounter (Incomplete)
Virtual Visit Pre-Appointment Phone Call  "(Name), I am calling you today to discuss your upcoming appointment. We are currently trying to limit exposure to the virus that causes COVID-19 by seeing patients at home rather than in the office."  1. "What is the BEST phone number to call the day of the visit?" - include this in appointment notes  2. Do you have or have access to (through a family member/friend) a smartphone with video capability that we can use for your visit?" a. If yes - list this number in appt notes as cell (if different from BEST phone #) and list the appointment type as a VIDEO visit in appointment notes b. If no - list the appointment type as a PHONE visit in appointment notes  3. Confirm consent - "In the setting of the current Covid19 crisis, you are scheduled for a (phone or video) visit with your provider on (date) at (time).  Just as we do with many in-office visits, in order for you to participate in this visit, we must obtain consent.  If you'd like, I can send this to your mychart (if signed up) or email for you to review.  Otherwise, I can obtain your verbal consent now.  All virtual visits are billed to your insurance company just like a normal visit would be.  By agreeing to a virtual visit, we'd like you to understand that the technology does not allow for your provider to perform an examination, and thus may limit your provider's ability to fully assess your condition. If your provider identifies any concerns that need to be evaluated in person, we will make arrangements to do so.  Finally, though the technology is pretty good, we cannot assure that it will always work on either your or our end, and in the setting of a video visit, we may have to convert it to a phone-only visit.  In either situation, we cannot ensure that we have a secure connection.  Are you willing to proceed?" STAFF: Did the patient verbally acknowledge consent to telehealth visit? Document  YES/NO here: *** Yes/Video/doxy.me/dsmart phone [phone] consent5/8/20/  4. Advise patient to be prepared - "Two hours prior to your appointment, go ahead and check your blood pressure, pulse, oxygen saturation, and your weight (if you have the equipment to check those) and write them all down. When your visit starts, your provider will ask you for this information. If you have an Apple Watch or Kardia device, please plan to have heart rate information ready on the day of your appointment. Please have a pen and paper handy nearby the day of the visit as well."  5. Give patient instructions for MyChart download to smartphone OR Doximity/Doxy.me as below if video visit (depending on what platform provider is using)  6. Inform patient they will receive a phone call 15 minutes prior to their appointment time (may be from unknown caller ID) so they should be prepared to answer    TELEPHONE CALL NOTE  Colin Lee has been deemed a candidate for a follow-up tele-health visit to limit community exposure during the Covid-19 pandemic. I spoke with the patient via phone to ensure availability of phone/video source, confirm preferred email & phone number, and discuss instructions and expectations.  I reminded Colin Lee to be prepared with any vital sign and/or heart rhythm information that could potentially be obtained via home monitoring, at the time of his visit. I reminded Colin Lee to expect  a phone call prior to his visit.  Armando Gang 08/14/2018 4:45 PM   INSTRUCTIONS FOR DOWNLOADING THE MYCHART APP TO SMARTPHONE  - The patient must first make sure to have activated MyChart and know their login information - If Apple, go to [employer] and type in MyChart in the search bar and download the app. If Android, ask patient to go to [employer] and type in [place] in the search bar and download the app. The app is free but as with any other app downloads,  their phone may require them to verify saved payment information or Apple/Android password.  - The patient will need to then log into the app with their MyChart username and password, and select [facility] as their healthcare provider to link the account. When it is time for your visit, go to the MyChart app, find appointments, and click Begin Video Visit. Be sure to Select Allow for your device to access the Microphone and Camera for your visit. You will then be connected, and your provider will be with you shortly.  **If they have any issues connecting, or need assistance please contact MyChart service desk [phone])**  **If using a computer, in order to ensure the best quality for their visit they will need to use either of the following Internet Browsers: [employer], or Google Chrome**  IF USING DOXIMITY or DOXY.ME - The patient will receive a link just prior to their visit by text.     FULL LENGTH CONSENT FOR TELE-HEALTH VISIT   I hereby voluntarily request, consent and authorize [facility] and its employed or contracted physicians, physician assistants, nurse practitioners or other licensed health care professionals (the Practitioner), to provide me with telemedicine health care services (the Services") as deemed necessary by the treating Practitioner. I acknowledge and consent to receive the Services by the Practitioner via telemedicine. I understand that the telemedicine visit will involve communicating with the Practitioner through live audiovisual communication technology and the disclosure of certain medical information by electronic transmission. I acknowledge that I have been given the opportunity to request an in-person assessment or other available alternative prior to the telemedicine visit and am voluntarily participating in the telemedicine visit.  I understand that I have the right to withhold or withdraw my consent to the use of telemedicine in the  course of my care at any time, without affecting my right to future care or treatment, and that the Practitioner or I may terminate the telemedicine visit at any time. I understand that I have the right to inspect all information obtained and/or recorded in the course of the telemedicine visit and may receive copies of available information for a reasonable fee.  I understand that some of the potential risks of receiving the Services via telemedicine include:   Delay or interruption in medical evaluation due to technological equipment failure or disruption;  Information transmitted may not be sufficient (e.g. poor resolution of images) to allow for appropriate medical decision making by the Practitioner; and/or   In rare instances, security protocols could fail, causing a breach of personal health information.  Furthermore, I acknowledge that it is my responsibility to provide information about my medical history, conditions and care that is complete and accurate to the best of my ability. I acknowledge that Practitioner's advice, recommendations, and/or decision may be based on factors not within their control, such as incomplete or inaccurate data provided by me or distortions of diagnostic images or specimens that may  result from electronic transmissions. I understand that the practice of medicine is not an exact science and that Practitioner makes no warranties or guarantees regarding treatment outcomes. I acknowledge that I will receive a copy of this consent concurrently upon execution via email to the email address I last provided but may also request a printed copy by calling the office of [facility].    I understand that my insurance will be billed for this visit.   I have read or had this consent read to me.  I understand the contents of this consent, which adequately explains the benefits and risks of the Services being provided via telemedicine.   I have been provided ample  opportunity to ask questions regarding this consent and the Services and have had my questions answered to my satisfaction.  I give my informed consent for the services to be provided through the use of telemedicine in my medical care  By participating in this telemedicine visit I agree to the above.

## 2018-08-17 ENCOUNTER — Telehealth: Payer: Self-pay

## 2018-08-17 ENCOUNTER — Encounter: Payer: Self-pay | Admitting: Cardiology

## 2018-08-17 ENCOUNTER — Other Ambulatory Visit: Payer: Self-pay

## 2018-08-17 ENCOUNTER — Telehealth (INDEPENDENT_AMBULATORY_CARE_PROVIDER_SITE_OTHER): Payer: BC Managed Care – PPO | Admitting: Cardiology

## 2018-08-17 VITALS — HR 93 | Ht 70.0 in | Wt 275.0 lb

## 2018-08-17 DIAGNOSIS — I2510 Atherosclerotic heart disease of native coronary artery without angina pectoris: Secondary | ICD-10-CM

## 2018-08-17 DIAGNOSIS — E785 Hyperlipidemia, unspecified: Secondary | ICD-10-CM

## 2018-08-17 DIAGNOSIS — G4719 Other hypersomnia: Secondary | ICD-10-CM

## 2018-08-17 DIAGNOSIS — I10 Essential (primary) hypertension: Secondary | ICD-10-CM

## 2018-08-17 MED ORDER — NITROGLYCERIN 0.4 MG SL SUBL: 0.4000 mg | SUBLINGUAL_TABLET | SUBLINGUAL | 3 refills | Status: DC | PRN

## 2018-08-17 NOTE — Progress Notes (Signed)
Virtual Visit via Video Note   This visit type was conducted due to national recommendations for restrictions regarding the COVID-19 Pandemic (e.g. social distancing) in an effort to limit this patient's exposure and mitigate transmission in our community.  Due to his co-morbid illnesses, this patient is at least at moderate risk for complications without adequate follow up.  This format is felt to be most appropriate for this patient at this time.  All issues noted in this document were discussed and addressed.  A limited physical exam was performed with this format.  Please refer to the patient's chart for his consent to telehealth for [facility].   Evaluation Performed:  Follow-up visit  This visit type was conducted due to national recommendations for restrictions regarding the COVID-19 Pandemic (e.g. social distancing).  This format is felt to be most appropriate for this patient at this time.  All issues noted in this document were discussed and addressed.  No physical exam was performed (except for noted visual exam findings with Video Visits).  Please refer to the patient's chart (MyChart message for video visits and phone note for telephone visits) for the patient's consent to telehealth for [facility]  Date:  08/17/2018   ID:  Colin Lee, DOB 1960-12-11, MRN [id number]  Patient Location:  Home  Provider location:   [place]  PCP:  [name], MD  Cardiologist:  Fransico Him, MD  Electrophysiologist:  None   Chief Complaint:  CAD, HTN and lipids  History of Present Illness:    Colin Lee is a 58 y.o. male who presents via audio/video conferencing for a telehealth visit today.    Colin Lee is a 58 y.o. male with a hx of recurrent left pleural effusion and has had drainage and talc pleurodesis with Dr. [name]. He has a history of ASCAD with cath showing high grade proximal RCA stenosis that was treated with DES x 2. He had recurrent  CP post PCI. Re-look cath demonstrated patent stents in the RCA. He has a history of moderate pleural effusion and community acquired pneumonia. He underwent thoracocentesis and was found to have bloody effusion with fluid analysis showing lymphocytes. ID and pulmonary consulted and suggested this could be rheumatologic in origin and rheumatology work up ordered and was negative. No malignancy noted.   He is here today for followup and is doing well.  He denies any chest pain or pressure, SOB, DOE, PND, orthopnea, LE edema, dizziness, palpitations or syncope. He is compliant with his meds and is tolerating meds with no SE.   His biggest concern today is that he says that he wakes up a lot at night and does not feel rested in the morning.  He says he feels very tired during the day and wants to nap.  The patient does not have symptoms concerning for COVID-19 infection (fever, chills, cough, or new shortness of breath).    Prior CV studies:   The following studies were reviewed today:  none  Past Medical History:  Diagnosis Date   Allergy    Arthritis    Asthma    Coronary artery disease 2016   90% RCA and 30% LM S/P PCI of RCA   GERD (gastroesophageal reflux disease)    Headache    stress and tension   History of nuclear stress test    Myoview 1/19: EF 61, no ischemia, low risk   Hyperlipidemia    Hypertension    Neuromuscular disorder ([place])  restless legs and leg cramps   Shortness of breath    Starting May 2015   Tubular adenoma of colon 03/2013   Past Surgical History:  Procedure Laterality Date   CHEST TUBE INSERTION Left 02/01/2014   Procedure: INSERTION PLEURAL DRAINAGE CATHETER;  Surgeon: Grace Isaac, MD;  Location: [facility];  Service: Thoracic;  Laterality: Left;   COLONOSCOPY     COLONOSCOPY W/ BIOPSIES AND POLYPECTOMY  2014   benign   LEFT HEART CATHETERIZATION WITH CORONARY ANGIOGRAM N/A 06/07/2014   Procedure: LEFT HEART CATHETERIZATION WITH  CORONARY ANGIOGRAM;  Surgeon: Peter M Martinique, MD;  Location: [facility] CATH LAB;  Service: Cardiovascular;  Laterality: N/A;   LEFT HEART CATHETERIZATION WITH CORONARY ANGIOGRAM N/A 06/08/2014   Procedure: LEFT HEART CATHETERIZATION WITH CORONARY ANGIOGRAM;  Surgeon: Sinclair Grooms, MD;  Location: [facility] CATH LAB;  Service: Cardiovascular;  Laterality: N/A;   no prior surgery     PLEURAL BIOPSY Left 02/01/2014   Procedure: PLEURAL BIOPSY;  Surgeon: Grace Isaac, MD;  Location: [facility];  Service: Thoracic;  Laterality: Left;   PLEURAL EFFUSION DRAINAGE Left 02/01/2014   Procedure: DRAINAGE OF PLEURAL EFFUSION;  Surgeon: Grace Isaac, MD;  Location: [facility];  Service: Thoracic;  Laterality: Left;   REMOVAL OF PLEURAL DRAINAGE CATHETER Left 02/16/2014   Procedure: REMOVAL OF PLEURAL DRAINAGE CATHETER;  Surgeon: Grace Isaac, MD;  Location: [facility];  Service: Thoracic;  Laterality: Left;   TALC PLEURODESIS Left 02/01/2014   Procedure: Pietro Cassis;  Surgeon: Grace Isaac, MD;  Location: [facility];  Service: Thoracic;  Laterality: Left;   THORACENTESIS Left 2015   VIDEO ASSISTED THORACOSCOPY Left 02/01/2014   Procedure: VIDEO ASSISTED THORACOSCOPY;  Surgeon: Grace Isaac, MD;  Location: [facility] [facility];  Service: Thoracic;  Laterality: Left;   VIDEO BRONCHOSCOPY N/A 02/01/2014   Procedure: VIDEO BRONCHOSCOPY;  Surgeon: Grace Isaac, MD;  Location: [facility] OR;  Service: Thoracic;  Laterality: N/A;     Current Meds  Medication Sig   acetaminophen (TYLENOL) 500 MG tablet Take 1,000 mg by mouth every 6 (six) hours as needed for moderate pain.   aspirin EC 81 MG tablet Take 81 mg by mouth daily.   atorvastatin (LIPITOR) 40 MG tablet Take 1 tablet (40 mg total) by mouth daily at 6 PM.   clopidogrel (PLAVIX) 75 MG tablet TAKE 1 TABLET BY MOUTH ONCE DAILY.   loratadine (CLARITIN) 10 MG tablet Take 10 mg by mouth daily.   losartan (COZAAR) 100 MG tablet TAKE 1 TABLET BY MOUTH ONCE DAILY.     metoprolol tartrate (LOPRESSOR) 25 MG tablet Take 0.5 tablets (12.5 mg total) by mouth 2 (two) times daily.   Multiple Vitamins-Minerals (CENTRUM ADULTS PO) Take 1 tablet by mouth daily.   Omega-3 Fatty Acids (FISH OIL) 1000 MG CPDR Take 4,000 mg by mouth daily.   pantoprazole (PROTONIX) 40 MG tablet TAKE 1 TABLET BY MOUTH ONCE DAILY.     Allergies:   Patient has no known allergies.   Social History   Tobacco Use   Smoking status: Former Smoker    Packs/day: 1.00    Years: 30.00    Pack years: 30.00    Types: Cigarettes    Last attempt to quit: 04/08/2000    Years since quitting: 18.3   Smokeless tobacco: Never Used  Substance Use Topics   Alcohol use: Yes    Comment: rare   Drug use: No     Family Hx: The  patient's family history is negative for Colon cancer, Esophageal cancer, Rectal cancer, and Stomach cancer. He was adopted.  ROS:   Please see the history of present illness.     All other systems reviewed and are negative.   Labs/Other Tests and Data Reviewed:    Recent Labs: 09/05/2017: ALT 35   Recent Lipid Panel Lab Results  Component Value Date/Time   CHOL 89 09/05/2017 08:24 AM   TRIG 67 09/05/2017 08:24 AM   HDL 29 (L) 09/05/2017 08:24 AM   CHOLHDL 3.1 09/05/2017 08:24 AM   LDLCALC 46 09/05/2017 08:24 AM    Wt Readings from Last 3 Encounters:  08/17/18 275 lb (124.7 kg)  04/10/18 277 lb (125.6 kg)  03/31/18 277 lb 2 oz (125.7 kg)     Objective:    Vital Signs:  Pulse 93    Ht 5\' 10"  (1.778 m)    Wt 275 lb (124.7 kg)    BMI 39.46 kg/m    CONSTITUTIONAL:  Well nourished, well developed male in no acute distress.  EYES: anicteric MOUTH: oral mucosa is pink RESPIRATORY: Normal respiratory effort, symmetric expansion CARDIOVASCULAR: No peripheral edema SKIN: No rash, lesions or ulcers MUSCULOSKELETAL: no digital cyanosis NEURO: Cranial Nerves II-XII grossly intact, moves all extremities PSYCH: Intact judgement and insight.  A&O x 3,  Mood/affect appropriate   ASSESSMENT & PLAN:    1.  ASCAD -  remote cath 2016 showed high grade proximal RCA stenosis that was treated with DES x 2. He had recurrent CP post PCI and renal look cath demonstrated patent stents in the RCA.  He has not had any anginal symptoms.  He will stay on aspirin 81 mg daily, Plavix 75 mg daily, statin and beta-blocker.  2.  HTN -his blood pressure is controlled today.  He will continue on metoprolol 12.5 mg twice daily and losartan 100 mg daily.  His last creatinine was stable at 0.9 a year ago.  I will also check a bmet in July 2.  3.  Hyperlipidemia -his LDL goal is less than 70.  His last LDL was 46 a year ago.  I will check an FLP and ALT in July once COVID crisis has improved.  He will continue on atorvastatin 40 mg daily.  4.  Excessive daytime sleepiness - he is having a lot of problems with erratic sleep at night waking up frequently and then feeling very tired during the day and sleepy.  I recommended getting a sleep study to further evaluate.  5.  COVID-19 Education:The signs and symptoms of COVID-19 were discussed with the patient and how to seek care for testing (follow up with PCP or arrange E-visit).  The importance of social distancing was discussed today.  Patient Risk:   After full review of this patient's clinical status, I feel that they are at least moderate risk at this time.  Time:   Today, I have spent 20 minutes with telehealth technology.    Medication Adjustments/Labs and Tests Ordered: Current medicines are reviewed at length with the patient today.  Concerns regarding medicines are outlined above.  Tests Ordered: No orders of the defined types were placed in this encounter.  Medication Changes: No orders of the defined types were placed in this encounter.   Disposition:  Follow up in 1 year(s)  Signed, Fransico Him, MD  08/17/2018 8:16 AM    [facility]

## 2018-08-17 NOTE — Patient Instructions (Addendum)
Medication Instructions:   If you need a refill on your cardiac medications before your next appointment, please call your pharmacy.   Lab work: Your physician recommends that you return for lab work in: July for BMET, ALT, and fasting lipid panel. Please make sure when you come in for these labs that you have been fasting for at least 8 hours.  If you have labs (blood work) drawn today and your tests are completely normal, you will receive your results only by: Marland Kitchen MyChart Message (if you have MyChart) OR . A paper copy in the mail If you have any lab test that is abnormal or we need to change your treatment, we will call you to review the results.  Testing/Procedures: None ordered today.  Follow-Up: At [facility], you and your health needs are our priority.  As part of our continuing mission to provide you with exceptional heart care, we have created designated Provider Care Teams.  These Care Teams include your primary Cardiologist (physician) and Advanced Practice Providers (APPs -  Physician Assistants and Nurse Practitioners) who all work together to provide you with the care you need, when you need it. You will need a follow up appointment in 12 months.  Please call our office 2 months in advance to schedule this appointment.  You may see Fransico Him, MD or one of the following Advanced Practice Providers on your designated Care Team:   [place], PA-C Melina Copa, PA-C . Ermalinda Barrios, PA-C   Someone from our office will call you to set up a lab appointment for you in July.

## 2018-08-17 NOTE — Telephone Encounter (Addendum)
Placed order for sleep study. Will forward to Sleep Study pool and Gae Bon.  ----- Message from Sueanne Margarita, MD sent at 08/17/2018 11:05 AM EDT ----- Jeannene Patella I forgot this patient needs a sleep study for excessive daytime sleepiness

## 2018-08-24 ENCOUNTER — Other Ambulatory Visit: Payer: Self-pay | Admitting: Physician Assistant

## 2018-08-24 ENCOUNTER — Other Ambulatory Visit: Payer: Self-pay | Admitting: Cardiology

## 2018-08-25 ENCOUNTER — Ambulatory Visit: Payer: BC Managed Care – PPO | Admitting: Cardiology

## 2018-09-23 ENCOUNTER — Telehealth: Payer: Self-pay | Admitting: Gastroenterology

## 2018-09-23 DIAGNOSIS — K7460 Unspecified cirrhosis of liver: Secondary | ICD-10-CM

## 2018-09-23 NOTE — Telephone Encounter (Signed)
Patient had requested another f.u appt so [facility] needs a new referral. Put in Epic.

## 2018-09-23 NOTE — Telephone Encounter (Signed)
 [facility] stated that pt has a FU appt tomorrow and would like [place] to send referral to them.

## 2018-09-24 ENCOUNTER — Ambulatory Visit: Payer: BC Managed Care – PPO | Admitting: Nutrition

## 2018-09-24 ENCOUNTER — Telehealth: Payer: Self-pay | Admitting: Nutrition

## 2018-09-24 NOTE — Telephone Encounter (Signed)
Called x 2. VM left to call back to complete appt.

## 2018-10-16 ENCOUNTER — Other Ambulatory Visit: Payer: BC Managed Care – PPO

## 2019-02-15 ENCOUNTER — Telehealth: Payer: Self-pay | Admitting: Cardiology

## 2019-02-15 NOTE — Telephone Encounter (Signed)
New Message     Pt called because he wanted an appt with Dr Radford Pax  He says he saw his PC and they told him to follow up with Dr Radford Pax for some breathing problems he is having and he has had some chest pain  He says it is nothing he will go to the ER for but he says over a month ago he had Pneumonia and is still having some fluid in his lungs

## 2019-02-15 NOTE — Telephone Encounter (Signed)
Call placed to Pt.  Appt already made for Wednesday witih Dr. Radford Pax.  Will request copy of Pt's notes from PCP office.

## 2019-02-16 NOTE — Progress Notes (Signed)
Cardiology Office Note:    Date:  02/17/2019   ID:  Colin Lee, DOB Oct 17, 1960, MRN [id number]  PCP:  [name], MD  Cardiologist:  Fransico Him, MD    Referring MD: [name], MD   Chief Complaint  Patient presents with  . Coronary Artery Disease  . Hypertension  . Hyperlipidemia    History of Present Illness:    Colin Lee is a 58 y.o. male with a hx of recurrent left pleural effusion s/p drainage and talc pleurodesis with Dr. [name]. He has a history of ASCAD with cath showing high grade proximal RCA stenosis that was treated with DES x 2. He had recurrent CP post PCI. Re-look cath demonstrated patent stents in the RCA. He has a history of moderate pleural effusion and community acquired pneumonia. He underwent thoracocentesis and was found to have bloody effusion with fluid analysis showing lymphocytes. ID and pulmonary consulted and suggested this could be rheumatologic in origin and rheumatology work up ordered and was negative. No malignancy noted.  He is here today for followup.  He has recently been having problems with cough, SOB and congestion.  His PCP ordered a Cxray that showed no pleural effusion but did showed atelectasis and possible PNA.  He was treated with antibiotics and has followed in a few weeks.  He has continued to have DOE and is concerned that it is his stent. He also has had chest pressure that can occur with and without exertion and is midsternal.  There is no radiation and it is not associated with nausea or diahporesis.  He denies any PND, orthopnea, LE edema, dizziness, palpitations or syncope. He is compliant with his meds and is tolerating meds with no SE.    Past Medical History:  Diagnosis Date  . Allergy   . Arthritis   . Asthma   . Coronary artery disease 2016   90% RCA and 30% LM S/P PCI of RCA  . GERD (gastroesophageal reflux disease)   . Headache    stress and tension  . History of nuclear stress test    Myoview 1/19: EF 61, no ischemia, low risk  . Hyperlipidemia   . Hypertension   . Neuromuscular disorder (HCC)    restless legs and leg cramps  . Shortness of breath    Starting May 2015  . Tubular adenoma of colon 03/2013    Past Surgical History:  Procedure Laterality Date  . CHEST TUBE INSERTION Left 02/01/2014   Procedure: INSERTION PLEURAL DRAINAGE CATHETER;  Surgeon: Grace Isaac, MD;  Location: [place];  Service: Thoracic;  Laterality: Left;  . COLONOSCOPY    . COLONOSCOPY W/ BIOPSIES AND POLYPECTOMY  2014   benign  . LEFT HEART CATHETERIZATION WITH CORONARY ANGIOGRAM N/A 06/07/2014   Procedure: LEFT HEART CATHETERIZATION WITH CORONARY ANGIOGRAM;  Surgeon: Peter M Martinique, MD;  Location: [facility] C[facility] LAB;  Service: Cardiovascular;  Laterality: N/A;  . LEFT HEART CATHETERIZATION WITH CORONARY ANGIOGRAM N/A 06/08/2014   Procedure: LEFT HEART CATHETERIZATION WITH CORONARY ANGIOGRAM;  Surgeon: Sinclair Grooms, MD;  Location: [facility] C[facility] LAB;  Service: Cardiovascular;  Laterality: N/A;  . no prior surgery    . PLEURAL BIOPSY Left 02/01/2014   Procedure: PLEURAL BIOPSY;  Surgeon: Grace Isaac, MD;  Location: [place];  Service: Thoracic;  Laterality: Left;  . PLEURAL EFFUSION DRAINAGE Left 02/01/2014   Procedure: DRAINAGE OF PLEURAL EFFUSION;  Surgeon: Grace Isaac, MD;  Location: [place];  Service: Thoracic;  Laterality: Left;  . REMOVAL OF PLEURAL DRAINAGE CATHETER Left 02/16/2014   Procedure: REMOVAL OF PLEURAL DRAINAGE CATHETER;  Surgeon: Grace Isaac, MD;  Location: [place];  Service: Thoracic;  Laterality: Left;  . TALC PLEURODESIS Left 02/01/2014   Procedure: Pietro Cassis;  Surgeon: Grace Isaac, MD;  Location: [place];  Service: Thoracic;  Laterality: Left;  . THORACENTESIS Left 2015  . VIDEO ASSISTED THORACOSCOPY Left 02/01/2014   Procedure: VIDEO ASSISTED THORACOSCOPY;  Surgeon: Grace Isaac, MD;  Location: [place];  Service: Thoracic;  Laterality: Left;  Marland Kitchen VIDEO  BRONCHOSCOPY N/A 02/01/2014   Procedure: VIDEO BRONCHOSCOPY;  Surgeon: Grace Isaac, MD;  Location[facility];  Service: Thoracic;  Laterality: N/A;    Current Medications: Current Meds  Medication Sig  . acetaminophen (TYLENOL) 500 MG tablet Take 1,000 mg by mouth every 6 (six) hours as needed for moderate pain.  Marland Kitchen aspirin EC 81 MG tablet Take 81 mg by mouth daily.  Marland Kitchen atorvastatin (LIPITOR) 40 MG tablet Take 1 tablet (40 mg total) by mouth daily at 6 PM.  . clopidogrel (PLAVIX) 75 MG tablet TAKE 1 TABLET BY MOUTH ONCE DAILY.  Marland Kitchen loratadine (CLARITIN) 10 MG tablet Take 10 mg by mouth daily.  Marland Kitchen losartan (COZAAR) 50 MG tablet TAKE (2) TABLETS BY MOUTH ONCE DAILY.  . metoprolol tartrate (LOPRESSOR) 25 MG tablet Take 0.5 tablets (12.5 mg total) by mouth 2 (two) times daily.  . Multiple Vitamins-Minerals (CENTRUM ADULTS PO) Take 1 tablet by mouth daily.  . Omega-3 Fatty Acids (FISH OIL) 1000 MG CPDR Take 4,000 mg by mouth daily.  . pantoprazole (PROTONIX) 40 MG tablet TAKE 1 TABLET BY MOUTH ONCE DAILY.     Allergies:   Patient has no known allergies.   Social History   Socioeconomic History  . Marital status: Married    Spouse name: Not on file  . Number of children: Not on file  . Years of education: Not on file  . Highest education level: Not on file  Occupational History  . Not on file  Social Needs  . Financial resource strain: Not on file  . Food insecurity    Worry: Not on file    Inability: Not on file  . Transportation needs    Medical: Not on file    Non-medical: Not on file  Tobacco Use  . Smoking status: Former Smoker    Packs/day: 1.00    Years: 30.00    Pack years: 30.00    Types: Cigarettes    Quit date: 04/08/2000    Years since quitting: 18.8  . Smokeless tobacco: Never Used  Substance and Sexual Activity  . Alcohol use: Yes    Comment: rare  . Drug use: No  . Sexual activity: Not on file  Lifestyle  . Physical activity    Days per week: Not on file     Minutes per session: Not on file  . Stress: Not on file  Relationships  . Social Herbalist on phone: Not on file    Gets together: Not on file    Attends religious service: Not on file    Active member of club or organization: Not on file    Attends meetings of clubs or organizations: Not on file    Relationship status: Not on file  Other Topics Concern  . Not on file  Social History Narrative  . Not on file     Family History: The patient's  family history is negative for Colon cancer, Esophageal cancer, Rectal cancer, and Stomach cancer. He was adopted.  ROS:   Please see the history of present illness.    ROS  All other systems reviewed and negative.   EKGs/Labs/Other Studies Reviewed:    The following studies were reviewed today: none none EKG:  EKG is not ordered today.    Recent Labs: No results found for requested labs within last 8760 hours.   Recent Lipid Panel    Component Value Date/Time   CHOL 89 09/05/2017 0824   TRIG 67 09/05/2017 0824   HDL 29 (L) 09/05/2017 0824   CHOLHDL 3.1 09/05/2017 0824   VLDL 18 02/09/2015 1253   LDLCALC 46 09/05/2017 0824    Physical Exam:    VS:  BP (!) 154/102   Pulse 74   Ht 5\' 10"  (1.778 m)   Wt 294 lb (133.4 kg)   BMI 42.18 kg/m     Wt Readings from Last 3 Encounters:  02/17/19 294 lb (133.4 kg)  08/17/18 275 lb (124.7 kg)  04/10/18 277 lb (125.6 kg)     GEN:  Well nourished, well developed in no acute distress HEENT: Normal NECK: No JVD; No carotid bruits LYMPHATICS: No lymphadenopathy CARDIAC: RRR, no murmurs, rubs, gallops RESPIRATORY:  Clear to auscultation without rales, wheezing or rhonchi  ABDOMEN: Soft, non-tender, non-distended MUSCULOSKELETAL:  No edema; No deformity  SKIN: Warm and dry NEUROLOGIC:  Alert and oriented x 3 PSYCHIATRIC:  Normal affect   ASSESSMENT:    1. Coronary artery disease involving native coronary artery of native heart without angina pectoris   2. Essential  hypertension   3. Dyslipidemia   4. Precordial pain   5. SOB (shortness of breath)    PLAN:    In order of problems listed above:  1.  ASCAD -cath 2016 showed high grade proximal RCA stenosis that was treated with DES x 2. He had recurrent CP post PCI. Re-look cath demonstrated patent stents in the RCA. -he has some DOE and chest pressure but it is nonexertional and in the setting of acute lung issues -I will get a Lexiscan myoview to rule out ischemia.    2.  HTN -BP poorly controlled on exam today -continue Losartan 100mg  daily and lopressor 12.5mg  BID -I have asked him to check his BP daily for a week and call with results -creatinine 1.21 in Sept 2020 and K+ 3.8.  3.  HLD -LDL goal < 70 -check FLP and ALT -continue atorvastatin 40mg  daily  4.  SOB -cxray with no evidence of recurrent pleural effusion but showed atelectasis and possible PNA -treated with antibx -I am not convinced that his sx are cardiac in origin and suspect this is all pulmonary -check 2D echo to assess LVF -nuclear stress test as above   Medication Adjustments/Labs and Tests Ordered: Current medicines are reviewed at length with the patient today.  Concerns regarding medicines are outlined above.  Orders Placed This Encounter  Procedures  . Myocardial Perfusion Imaging  . EKG 12/Charge capture  . ECHOCARDIOGRAM COMPLETE   No orders of the defined types were placed in this encounter.   Signed, Fransico Him, MD  02/17/2019 12:56 PM    [place]

## 2019-02-16 NOTE — Telephone Encounter (Signed)
I called PCP office requesting notes from last OV with Dr Bartolo Darter.  I told them to fax attn: Dr Radford Pax.

## 2019-02-17 ENCOUNTER — Encounter: Payer: Self-pay | Admitting: *Deleted

## 2019-02-17 ENCOUNTER — Ambulatory Visit: Payer: BC Managed Care – PPO | Admitting: Cardiology

## 2019-02-17 ENCOUNTER — Telehealth: Payer: BC Managed Care – PPO | Admitting: Cardiology

## 2019-02-17 ENCOUNTER — Other Ambulatory Visit: Payer: Self-pay

## 2019-02-17 ENCOUNTER — Encounter: Payer: Self-pay | Admitting: Cardiology

## 2019-02-17 VITALS — BP 154/102 | HR 74 | Ht 70.0 in | Wt 294.0 lb

## 2019-02-17 DIAGNOSIS — I10 Essential (primary) hypertension: Secondary | ICD-10-CM

## 2019-02-17 DIAGNOSIS — R0602 Shortness of breath: Secondary | ICD-10-CM

## 2019-02-17 DIAGNOSIS — R072 Precordial pain: Secondary | ICD-10-CM

## 2019-02-17 DIAGNOSIS — E785 Hyperlipidemia, unspecified: Secondary | ICD-10-CM

## 2019-02-17 DIAGNOSIS — I2510 Atherosclerotic heart disease of native coronary artery without angina pectoris: Secondary | ICD-10-CM

## 2019-02-17 NOTE — Patient Instructions (Addendum)
Medication Instructions:   Your physician recommends that you continue on your current medications as directed. Please refer to the Current Medication list given to you today.  *If you need a refill on your cardiac medications before your next appointment, please call your pharmacy*    Testing/Procedures:  Your physician has requested that you have a lexiscan myoview. For further information please visit [email or web address]. Please follow instruction sheet, as given.   Your physician has requested that you have an echocardiogram. Echocardiography is a painless test that uses sound waves to create images of your heart. It provides your doctor with information about the size and shape of your heart and how well your heart's chambers and valves are working. This procedure takes approximately one hour. There are no restrictions for this procedure.    Follow-Up: At [facility], you and your health needs are our priority.  As part of our continuing mission to provide you with exceptional heart care, we have created designated Provider Care Teams.  These Care Teams include your primary Cardiologist (physician) and Advanced Practice Providers (APPs -  Physician Assistants and Nurse Practitioners) who all work together to provide you with the care you need, when you need it.  Your next appointment:   12 months  The format for your next appointment:   Either In Person or Virtual  Provider:   Fransico Him, MD

## 2019-02-25 ENCOUNTER — Telehealth (HOSPITAL_COMMUNITY): Payer: Self-pay

## 2019-02-25 NOTE — Telephone Encounter (Signed)
Instructions left on the patient's answering machine. Asked to call back with any questions. S.Randel Hargens EMTP 

## 2019-03-02 ENCOUNTER — Other Ambulatory Visit: Payer: Self-pay

## 2019-03-02 ENCOUNTER — Other Ambulatory Visit (HOSPITAL_COMMUNITY): Payer: BC Managed Care – PPO

## 2019-03-02 ENCOUNTER — Ambulatory Visit (HOSPITAL_COMMUNITY): Payer: BC Managed Care – PPO | Attending: Cardiology

## 2019-03-02 DIAGNOSIS — I2510 Atherosclerotic heart disease of native coronary artery without angina pectoris: Secondary | ICD-10-CM | POA: Insufficient documentation

## 2019-03-02 DIAGNOSIS — R0602 Shortness of breath: Secondary | ICD-10-CM

## 2019-03-02 DIAGNOSIS — R072 Precordial pain: Secondary | ICD-10-CM | POA: Diagnosis not present

## 2019-03-02 DIAGNOSIS — E785 Hyperlipidemia, unspecified: Secondary | ICD-10-CM | POA: Insufficient documentation

## 2019-03-02 DIAGNOSIS — I10 Essential (primary) hypertension: Secondary | ICD-10-CM | POA: Insufficient documentation

## 2019-03-02 MED ORDER — TECHNETIUM TC 99M TETROFOSMIN IV KIT
32.5000 | PACK | Freq: Once | INTRAVENOUS | Status: AC | PRN
  Administered 2019-03-02: 32.5 via INTRAVENOUS
  Filled 2019-03-02: qty 33

## 2019-03-02 MED ORDER — REGADENOSON 0.4 MG/5ML IV SOLN
0.4000 mg | Freq: Once | INTRAVENOUS | Status: AC
  Administered 2019-03-02: 0.4 mg via INTRAVENOUS

## 2019-03-03 ENCOUNTER — Ambulatory Visit (HOSPITAL_BASED_OUTPATIENT_CLINIC_OR_DEPARTMENT_OTHER): Payer: BC Managed Care – PPO

## 2019-03-03 ENCOUNTER — Ambulatory Visit (HOSPITAL_COMMUNITY): Payer: BC Managed Care – PPO | Attending: Cardiovascular Disease

## 2019-03-03 DIAGNOSIS — E785 Hyperlipidemia, unspecified: Secondary | ICD-10-CM | POA: Diagnosis not present

## 2019-03-03 DIAGNOSIS — I2510 Atherosclerotic heart disease of native coronary artery without angina pectoris: Secondary | ICD-10-CM | POA: Insufficient documentation

## 2019-03-03 DIAGNOSIS — I10 Essential (primary) hypertension: Secondary | ICD-10-CM | POA: Diagnosis not present

## 2019-03-03 DIAGNOSIS — R072 Precordial pain: Secondary | ICD-10-CM | POA: Insufficient documentation

## 2019-03-03 DIAGNOSIS — R0602 Shortness of breath: Secondary | ICD-10-CM | POA: Insufficient documentation

## 2019-03-03 LAB — MYOCARDIAL PERFUSION IMAGING
LV dias vol: 108 mL (ref 62–150)
LV sys vol: 48 mL
Peak HR: 82 {beats}/min
Rest HR: 69 {beats}/min
SDS: 1
SRS: 0
SSS: 1
TID: 1.03

## 2019-03-03 MED ORDER — PERFLUTREN LIPID MICROSPHERE
1.0000 mL | INTRAVENOUS | Status: AC | PRN
  Administered 2019-03-03: 1 mL via INTRAVENOUS

## 2019-03-03 MED ORDER — TECHNETIUM TC 99M TETROFOSMIN IV KIT
31.3000 | PACK | Freq: Once | INTRAVENOUS | Status: AC | PRN
  Administered 2019-03-03: 31.3 via INTRAVENOUS
  Filled 2019-03-03: qty 32

## 2019-03-08 ENCOUNTER — Telehealth: Payer: Self-pay

## 2019-03-08 DIAGNOSIS — R0602 Shortness of breath: Secondary | ICD-10-CM

## 2019-03-08 NOTE — Telephone Encounter (Signed)
The patient has been notified of the result and verbalized understanding.  All questions (if any) were answered. Wilma Flavin, RN 03/08/2019 10:18 AM

## 2019-03-09 ENCOUNTER — Other Ambulatory Visit (HOSPITAL_COMMUNITY): Payer: BC Managed Care – PPO

## 2019-03-19 ENCOUNTER — Institutional Professional Consult (permissible substitution): Payer: BC Managed Care – PPO | Admitting: Pulmonary Disease

## 2019-03-29 ENCOUNTER — Encounter: Payer: Self-pay | Admitting: Internal Medicine

## 2019-03-29 ENCOUNTER — Other Ambulatory Visit (INDEPENDENT_AMBULATORY_CARE_PROVIDER_SITE_OTHER): Payer: BC Managed Care – PPO

## 2019-03-29 ENCOUNTER — Other Ambulatory Visit: Payer: Self-pay

## 2019-03-29 ENCOUNTER — Ambulatory Visit: Payer: BC Managed Care – PPO | Admitting: Internal Medicine

## 2019-03-29 ENCOUNTER — Ambulatory Visit (INDEPENDENT_AMBULATORY_CARE_PROVIDER_SITE_OTHER): Payer: BC Managed Care – PPO

## 2019-03-29 DIAGNOSIS — R0600 Dyspnea, unspecified: Secondary | ICD-10-CM

## 2019-03-29 DIAGNOSIS — R0609 Other forms of dyspnea: Secondary | ICD-10-CM

## 2019-03-29 LAB — BASIC METABOLIC PANEL
BUN: 15 mg/dL (ref 6–23)
CO2: 25 mEq/L (ref 19–32)
Calcium: 9.1 mg/dL (ref 8.4–10.5)
Chloride: 106 mEq/L (ref 96–112)
Creatinine, Ser: 0.99 mg/dL (ref 0.40–1.50)
GFR: 78.05 mL/min (ref >60.00)
Glucose, Bld: 86 mg/dL (ref 70–99)
Potassium: 3.9 mEq/L (ref 3.5–5.1)
Sodium: 141 mEq/L (ref 135–145)

## 2019-03-29 LAB — SEDIMENTATION RATE: Sed Rate: 22 mm/hr — ABNORMAL HIGH (ref 0–20)

## 2019-03-29 LAB — CK: Total CK: 119 U/L (ref 7–232)

## 2019-03-29 LAB — BRAIN NATRIURETIC PEPTIDE: Pro B Natriuretic peptide (BNP): 81 pg/mL (ref 0.0–100.0)

## 2019-03-29 MED ORDER — ATORVASTATIN CALCIUM 40 MG PO TABS: ORAL_TABLET | ORAL | Status: DC

## 2019-03-29 MED ORDER — FAMOTIDINE 20 MG PO TABS: ORAL_TABLET | ORAL | Status: AC

## 2019-03-29 NOTE — Progress Notes (Signed)
Colin Lee, male    DOB: 26-Jan-1961,   MRN: [id number]   Brief patient profile:  75  yowm [place]  Quit smoking 2002 with no resp sequelae at wt 250 with some springtime rhinitis some worse over the years better with zyrtec with new onset of doe in 2015 > Left pl effusion :  VATS pleurodesis DrG but not much better then stents done and improved until June/ July  2020 > more sob/cough > rx as sinus infection with cxr dx pna end of August rx zpak / prednisone not much better so referred to pulmonary clinic 03/29/2019 by Dr  Radford Pax with nl Myoview with lexican stress test 03/02/2019      History of Present Illness  03/29/2019  Pulmonary/ 1st office eval/Colin Lee  Chief Complaint  Patient presents with  . Pulmonary Consult    Referred by Dr. Radford Pax for sob. Patient reports sob with and without exertion. He also reports a "soreness' in his chest.   Dyspnea: occurs  At rest if talks too much/ across parking lot does ok but pace is slow = MMRC2 = can't walk a nl pace on a flat grade s sob but does fine slow and flat  Cough: better but still lots of phegm in am / no obvious pnds/ nasal obst symptms  Sleep: on side bed is flat/ 2 pillows  SABA use: none  Has "chest pressure" many days comes and goes s pattern lasting secs to min and generalized without worse with breathing or coughing assoc with overt hb when eats the wrong food, supposed to be on ppi bid but only takes with bfrast daily and skips sometimes   No obvious day to day or daytime variability or assoc   purulent sputum or mucus plugs or hemoptysis   subjective wheeze or overt sinus  symptoms.   Sleeping as above  without nocturnal   exacerbation  of respiratory c/o's or need for noct saba. Also denies any obvious fluctuation of symptoms with weather or environmental changes or other aggravating or alleviating factors except as outlined above   No unusual exposure hx or h/o childhood pna/ asthma or knowledge of premature  birth.  Current Allergies, Complete Past Medical History, Past Surgical History, Family History, and Social History were reviewed in [employer] record.  ROS  The following are not active complaints unless bolded Hoarseness, sore throat, dysphagia, dental problems, itching, sneezing,  nasal congestion or discharge of excess mucus or purulent secretions, ear ache,   fever, chills, sweats, unintended wt loss or wt gain, classically pleuritic or exertional cp,  orthopnea pnd or arm/hand swelling  or leg swelling, presyncope, palpitations, abdominal pain, anorexia, nausea, vomiting, diarrhea  or change in bowel habits or change in bladder habits, change in stools or change in urine, dysuria, hematuria,  rash, arthralgias esp shoulders, visual complaints, headache, numbness, weakness generalized but esp legs = bilat  or ataxia or problems with walking or coordination,  change in mood or  memory.           Past Medical History:  Diagnosis Date  . Allergy   . Arthritis   . Asthma   . Coronary artery disease 2016   90% RCA and 30% LM S/P PCI of RCA  . GERD (gastroesophageal reflux disease)   . Headache    stress and tension  . History of nuclear stress test    Myoview 1/19: EF 61, no ischemia, low risk  . Hyperlipidemia   .  Hypertension   . Neuromuscular disorder (HCC)    restless legs and leg cramps  . Shortness of breath    Starting May 2015  . Tubular adenoma of colon 03/2013    Outpatient Medications Prior to Visit  Medication Sig Dispense Refill  . acetaminophen (TYLENOL) 500 MG tablet Take 1,000 mg by mouth every 6 (six) hours as needed for moderate pain.    Marland Kitchen aspirin EC 81 MG tablet Take 81 mg by mouth daily.    Marland Kitchen atorvastatin (LIPITOR) 40 MG tablet Take 1 tablet (40 mg total) by mouth daily at 6 PM. 30 tablet 0  . clopidogrel (PLAVIX) 75 MG tablet TAKE 1 TABLET BY MOUTH ONCE DAILY. 90 tablet 3  . loratadine (CLARITIN) 10 MG tablet Take 10 mg by mouth daily.     Marland Kitchen losartan (COZAAR) 50 MG tablet TAKE (2) TABLETS BY MOUTH ONCE DAILY. 180 tablet 3  . metoprolol tartrate (LOPRESSOR) 25 MG tablet Take 0.5 tablets (12.5 mg total) by mouth 2 (two) times daily. 60 tablet 0  . Multiple Vitamins-Minerals (CENTRUM ADULTS PO) Take 1 tablet by mouth daily.    . nitroGLYCERIN (NITROSTAT) 0.4 MG SL tablet Place 1 tablet (0.4 mg total) under the tongue every 5 (five) minutes as needed for chest pain. 25 tablet 3  . Omega-3 Fatty Acids (FISH OIL) 1000 MG CPDR Take 4,000 mg by mouth daily.    . pantoprazole (PROTONIX) 40 MG tablet TAKE 1 TABLET BY MOUTH ONCE DAILY. 90 tablet 3      Objective:     BP 130/80 (BP Location: Left Arm, Cuff Size: Large)   Pulse 77   Temp (!) 97.3 F (36.3 C) (Temporal)   Ht 5\' 10"  (1.778 m)   Wt 294 lb (133.4 kg)   SpO2 98%   BMI 42.18 kg/m   SpO2: 98 %   Obese (MO by BMI) amb pleasant wm nad   HEENT : pt wearing mask not removed for exam due to covid -19 concerns.    NECK :  without JVD/Nodes/TM/ nl carotid upstrokes bilaterally   LUNGS: no acc muscle use,  Nl contour chest which is clear to A and P bilaterally without cough on insp or exp maneuvers   CV:  RRR  no s3 or murmur or increase in P2, and no edema   ABD:  Quite obese but soft and nontender with nl inspiratory excursion in the supine position. No bruits or organomegaly appreciated, bowel sounds nl  MS:  Nl gait/ ext warm without deformities, calf tenderness, cyanosis or clubbing No obvious joint restrictions   SKIN: warm and dry without lesions    NEURO:  alert, approp, nl sensorium with  no motor or cerebellar deficits apparent.     CXR PA and Lateral:   03/29/2019 :    I personally reviewed images and agree with radiology impression as follows:   Hyperinflation with increased chronic bronchitic changes.    Labs ordered/ reviewed:      Chemistry      Component Value Date/Time   NA 141 03/29/2019 1338   NA 143 04/28/2017 1205   K 3.9  03/29/2019 1338   CL 106 03/29/2019 1338   CO2 25 03/29/2019 1338   BUN 15 03/29/2019 1338   BUN 15 04/28/2017 1205   CREATININE 0.99 03/29/2019 1338      Component Value Date/Time   CALCIUM 9.1 03/29/2019 1338   ALKPHOS 65 06/16/2017 1506   AST 28 09/05/2017 0824   ALT  35 09/05/2017 0824   BILITOT 0.4 09/05/2017 0824        Lab Results   cbc  Nl  02/11/2019 s increased eos   cpk  03/29/2019   = 119  Lab Results  Component Value Date   DDIMER 0.70 (H) 03/29/2019      Lab Results  Component Value Date   TSH 3.2 02/12/2019      Lab Results  Component Value Date   PROBNP 81.0 03/29/2019       Lab Results  Component Value Date   ESRSEDRATE 22 (H) 03/29/2019   ESRSEDRATE 94 (H) 06/02/2014   ESRSEDRATE 37 (H) 12/29/2013     Labs ordered 03/29/2019  :      alpha one AT phenotype          Assessment   DOE (dyspnea on exertion) Quit smoking 2002 - Spirometry 03/29/2019  FEV1 2.25 (55%)  Ratio 0.74 with no resp to saba p ? Prior    Onset June 2020 with cards w/u neg 02/2019 and assoc with atypical cp on prn ppi - 03/29/2019   Walked RA x two laps =  approx 584ft @ fast pace - stopped due to end of study with sats of 92% at the end of the study and mild sob  - 03/29/2019 max rx for GERD   Symptoms are markedly disproportionate to objective findings and not clear to what extent this is actually a pulmonary  problem but pt does appear to have difficult to sort out respiratory symptoms of unknown origin for which  DDX  = almost all start with A and  include Adherence, Ace Inhibitors, Acid Reflux, Active Sinus Disease, Alpha 1 Antitripsin deficiency, Anxiety masquerading as Airways dz,  ABPA,  Allergy(esp in young), Aspiration (esp in elderly), Adverse effects of meds,  Active smoking or Vaping, A bunch of PE's/clot burden (a few small clots can't cause this syndrome unless there is already severe underlying pulm or vascular dz with poor reserve),  Anemia or thyroid disorder,  plus two Bs  = Bronchiectasis and Beta blocker use..and one C= CHF   Adherence is always the initial "prime suspect" and is a multilayered concern that requires a "trust but verify" approach in every patient - starting with knowing how to use medications, especially inhalers, correctly, keeping up with refills and understanding the fundamental difference between maintenance and prns vs those medications only taken for a very short course and then stopped and not refilled. - return with all meds in hand using a trust but verify approach to confirm accurate Medication  Reconciliation The principal here is that until we are certain that the  patients are doing what we've asked, it makes no sense to ask them to do more.   ? Acid (or non-acid) GERD > always difficult to exclude as up to 75% of pts in some series report no assoc GI/ Heartburn symptoms and he has overt HB with atypical (non-cardiac, non-plueritic) CP> rec max (24h)  acid suppression and diet restrictions/ reviewed and instructions given in writing.   ? Adverse drug effects > none of the usual suspects but some of his muscle complaints could be from high doses of statins > rec trial off and check cpk  =  119 on 40mg  lipitor daily   ? Alpha one AT def > send phenotype   ?  Asthma/ allergy > nothing to suggest this and he certainly does not have significant copd clinically nor by prior pfts -  NB when respiratory symptoms begin or become refractory well after a patient reports complete smoking cessation,  Especially when this wasn't the case while they were smoking, a red flag is raised based on the work of Dr Kris Mouton which states:  if you quit smoking when your best day FEV1 is still well preserved it is highly unlikely you will progress to severe disease. That is to say, once the smoking stops,  the symptoms should not suddenly erupt or markedly worsen.  If so, the differential diagnosis should include  obesity/deconditioning,   LPR/Reflux/Aspiration syndromes,  occult CHF, or  especially side effect of medications commonly used in this population.    ? Anemia, thyroid dz > already excluded -see labs  ? A bunch of PE's > D dimer  high normal value (seen commonly in the elderly or chronically ill)  may miss small peripheral pe, the clot burden with sob is moderately high and the d dimer  has a very high neg pred value if used in this setting.    ? chf > excluded by cards   F/u can be in 4 weeks but call sooner if needed    Morbid obesity due to excess calories ([place]) Baseline wt around 250 when quit smoking 2002   Body mass index is 42.18 kg/m.  = 294 or 44 lbs up after quit smoking  Lab Results  Component Value Date   TSH 1.410 12/27/2013     Contributing to gerd risk/ doe/reviewed the need and the process to achieve and maintain neg calorie balance > defer f/u primary care including intermittently monitoring thyroid status      Total time devoted to counseling  > 50 % of initial 60 min office visit:  reviewed case with pt/ directly observed portions of ambulatory 02 saturation study/  discussion of options/alternatives/ personally creating written customized instructions  in presence of pt  then going over those specific  Instructions directly with the pt including how to use all of the meds but in particular covering each new medication in detail and the difference between the maintenance= "automatic" meds and the prns using an action plan format for the latter (If this problem/symptom => do that organization reading Left to right).  Please see AVS from this visit for a full list of these instructions which I personally wrote for this pt and  are unique to this visit.    Christinia Gully, MD 03/29/2019

## 2019-03-29 NOTE — Patient Instructions (Signed)
Try lipitor 40 mg one half daily x 2 weeks to see what difference it makes with your aches and strength/energy  Be sure to take Pantoprazole (protonix) 40 mg   Take  30-60 min before first meal of the day and Pepcid otc (famotidine)  20 mg one @  bedtime until return to office - this is the best way to tell whether stomach acid is contributing to your problem.    GERD (REFLUX)  is an extremely common cause of respiratory symptoms just like yours , many times with no obvious heartburn at all.    It can be treated with medication, but also with lifestyle changes including elevation of the head of your bed (ideally with 6 -8inch blocks under the headboard of your bed),  Smoking cessation, avoidance of late meals, excessive alcohol, and avoid fatty foods, chocolate, peppermint, colas, red wine, and acidic juices such as orange juice.  NO MINT OR MENTHOL PRODUCTS SO NO COUGH DROPS  USE SUGARLESS CANDY INSTEAD (Jolley ranchers or Stover's or Life Savers) or even ice chips will also do - the key is to swallow to prevent all throat clearing. NO OIL BASED VITAMINS - use powdered substitutes.  Avoid fish oil when coughing.   Please remember to go to the lab and x-ray department   for your tests - we will call you with the results when they are available.     We will set you up for follow up in one month in [place] with all active in meds in hand.

## 2019-04-01 ENCOUNTER — Encounter: Payer: Self-pay | Admitting: Internal Medicine

## 2019-04-01 ENCOUNTER — Other Ambulatory Visit: Payer: Self-pay | Admitting: Cardiology

## 2019-04-01 HISTORY — DX: Morbid (severe) obesity due to excess calories: E66.01

## 2019-04-01 NOTE — Assessment & Plan Note (Signed)
Baseline wt around 250 when quit smoking 2002   Body mass index is 42.18 kg/m.  = 294 or 44 lbs up after quit smoking  Lab Results  Component Value Date   TSH 1.410 12/27/2013     Contributing to gerd risk/ doe/reviewed the need and the process to achieve and maintain neg calorie balance > defer f/u primary care including intermittently monitoring thyroid status

## 2019-04-01 NOTE — Progress Notes (Signed)
Spoke with pt and notified of results per Dr. Wert. Pt verbalized understanding and denied any questions. 

## 2019-04-01 NOTE — Assessment & Plan Note (Addendum)
Quit smoking 2002 - Spirometry 03/29/2019  FEV1 2.25 (55%)  Ratio 0.74 with no resp to saba p ? Prior    Onset June 2020 with cards w/u neg 02/2019 and assoc with atypical cp on prn ppi - 03/29/2019   Walked RA x two laps =  approx 522ft @ fast pace - stopped due to end of study with sats of 92% at the end of the study and mild sob  - 03/29/2019 max rx for GERD   Symptoms are markedly disproportionate to objective findings and not clear to what extent this is actually a pulmonary  problem but pt does appear to have difficult to sort out respiratory symptoms of unknown origin for which  DDX  = almost all start with A and  include Adherence, Ace Inhibitors, Acid Reflux, Active Sinus Disease, Alpha 1 Antitripsin deficiency, Anxiety masquerading as Airways dz,  ABPA,  Allergy(esp in young), Aspiration (esp in elderly), Adverse effects of meds,  Active smoking or Vaping, A bunch of PE's/clot burden (a few small clots can't cause this syndrome unless there is already severe underlying pulm or vascular dz with poor reserve),  Anemia or thyroid disorder, plus two Bs  = Bronchiectasis and Beta blocker use..and one C= CHF   Adherence is always the initial "prime suspect" and is a multilayered concern that requires a "trust but verify" approach in every patient - starting with knowing how to use medications, especially inhalers, correctly, keeping up with refills and understanding the fundamental difference between maintenance and prns vs those medications only taken for a very short course and then stopped and not refilled. - return with all meds in hand using a trust but verify approach to confirm accurate Medication  Reconciliation The principal here is that until we are certain that the  patients are doing what we've asked, it makes no sense to ask them to do more.   ? Acid (or non-acid) GERD > always difficult to exclude as up to 75% of pts in some series report no assoc GI/ Heartburn symptoms and he has overt  HB with atypical (non-cardiac, non-plueritic) CP> rec max (24h)  acid suppression and diet restrictions/ reviewed and instructions given in writing.   ? Adverse drug effects > none of the usual suspects but some of his muscle complaints could be from high doses of statins > rec trial off and check cpk  =  119 on 40mg  lipitor daily   ? Alpha one AT def > send phenotype  ?  Asthma/ allergy > nothing to suggest this and he certainly does not have significant copd clinically nor by prior pfts -  NB when respiratory symptoms begin or become refractory well after a patient reports complete smoking cessation,  Especially when this wasn't the case while they were smoking, a red flag is raised based on the work of Dr Kris Mouton which states:  if you quit smoking when your best day FEV1 is still well preserved it is highly unlikely you will progress to severe disease. That is to say, once the smoking stops,  the symptoms should not suddenly erupt or markedly worsen.  If so, the differential diagnosis should include  obesity/deconditioning,  LPR/Reflux/Aspiration syndromes,  occult CHF, or  especially side effect of medications commonly used in this population.    ? Anemia, thyroid dz > already excluded -see labs  ? A bunch of PE's > D dimer  high normal value (seen commonly in the elderly or chronically ill)  may miss  small peripheral pe, the clot burden with sob is moderately high and the d dimer  has a very high neg pred value if used in this setting.    ? chf > excluded by cards   F/u can be in 4 weeks but call sooner if needed    Total time devoted to counseling  > 50 % of initial 60 min office visit:  reviewed case with pt/ directly observed portions of ambulatory 02 saturation study/  discussion of options/alternatives/ personally creating written customized instructions  in presence of pt  then going over those specific  Instructions directly with the pt including how to use all of the meds but in  particular covering each new medication in detail and the difference between the maintenance= "automatic" meds and the prns using an action plan format for the latter (If this problem/symptom => do that organization reading Left to right).  Please see AVS from this visit for a full list of these instructions which I personally wrote for this pt and  are unique to this visit.

## 2019-04-07 LAB — D-DIMER, QUANTITATIVE: D-Dimer, Quant: 0.7 mcg/mL FEU — ABNORMAL HIGH (ref <0.50)

## 2019-04-07 LAB — ALPHA-1 ANTITRYPSIN PHENOTYPE: A-1 Antitrypsin, Ser: 159 mg/dL (ref 83–199)

## 2019-06-07 DIAGNOSIS — M069 Rheumatoid arthritis, unspecified: Secondary | ICD-10-CM

## 2019-06-07 HISTORY — DX: Rheumatoid arthritis, unspecified: M06.9

## 2019-06-16 NOTE — Telephone Encounter (Signed)
X °

## 2019-06-18 ENCOUNTER — Other Ambulatory Visit: Payer: Self-pay | Admitting: Cardiology

## 2019-10-20 ENCOUNTER — Emergency Department (HOSPITAL_COMMUNITY): Payer: BC Managed Care – PPO

## 2019-10-20 ENCOUNTER — Other Ambulatory Visit: Payer: Self-pay

## 2019-10-20 ENCOUNTER — Encounter (HOSPITAL_COMMUNITY): Payer: Self-pay | Admitting: Emergency Medicine

## 2019-10-20 ENCOUNTER — Inpatient Hospital Stay (HOSPITAL_COMMUNITY)
Admission: EM | Admit: 2019-10-20 | Discharge: 2019-10-26 | DRG: 603 | Disposition: A | Discharge status: Home / Self Care | Payer: BC Managed Care – PPO | Attending: Family Medicine | Admitting: Family Medicine

## 2019-10-20 DIAGNOSIS — Z20822 Contact with and (suspected) exposure to covid-19: Secondary | ICD-10-CM | POA: Diagnosis present

## 2019-10-20 DIAGNOSIS — M069 Rheumatoid arthritis, unspecified: Secondary | ICD-10-CM | POA: Diagnosis present

## 2019-10-20 DIAGNOSIS — R509 Fever, unspecified: Secondary | ICD-10-CM | POA: Diagnosis present

## 2019-10-20 DIAGNOSIS — K219 Gastro-esophageal reflux disease without esophagitis: Secondary | ICD-10-CM | POA: Diagnosis present

## 2019-10-20 DIAGNOSIS — I2699 Other pulmonary embolism without acute cor pulmonale: Secondary | ICD-10-CM

## 2019-10-20 DIAGNOSIS — R778 Other specified abnormalities of plasma proteins: Secondary | ICD-10-CM

## 2019-10-20 DIAGNOSIS — I2510 Atherosclerotic heart disease of native coronary artery without angina pectoris: Secondary | ICD-10-CM | POA: Diagnosis not present

## 2019-10-20 DIAGNOSIS — Z6841 Body Mass Index (BMI) 40.0 and over, adult: Secondary | ICD-10-CM

## 2019-10-20 DIAGNOSIS — J449 Chronic obstructive pulmonary disease, unspecified: Secondary | ICD-10-CM | POA: Diagnosis present

## 2019-10-20 DIAGNOSIS — Z7902 Long term (current) use of antithrombotics/antiplatelets: Secondary | ICD-10-CM

## 2019-10-20 DIAGNOSIS — Z7982 Long term (current) use of aspirin: Secondary | ICD-10-CM

## 2019-10-20 DIAGNOSIS — R7881 Bacteremia: Secondary | ICD-10-CM | POA: Diagnosis present

## 2019-10-20 DIAGNOSIS — Z79899 Other long term (current) drug therapy: Secondary | ICD-10-CM

## 2019-10-20 DIAGNOSIS — E785 Hyperlipidemia, unspecified: Secondary | ICD-10-CM | POA: Diagnosis not present

## 2019-10-20 DIAGNOSIS — L03115 Cellulitis of right lower limb: Principal | ICD-10-CM | POA: Diagnosis present

## 2019-10-20 DIAGNOSIS — R21 Rash and other nonspecific skin eruption: Secondary | ICD-10-CM

## 2019-10-20 DIAGNOSIS — L02415 Cutaneous abscess of right lower limb: Secondary | ICD-10-CM | POA: Diagnosis present

## 2019-10-20 DIAGNOSIS — I248 Other forms of acute ischemic heart disease: Secondary | ICD-10-CM | POA: Diagnosis present

## 2019-10-20 DIAGNOSIS — A419 Sepsis, unspecified organism: Secondary | ICD-10-CM

## 2019-10-20 DIAGNOSIS — I10 Essential (primary) hypertension: Secondary | ICD-10-CM | POA: Diagnosis present

## 2019-10-20 DIAGNOSIS — Z7952 Long term (current) use of systemic steroids: Secondary | ICD-10-CM

## 2019-10-20 DIAGNOSIS — G2581 Restless legs syndrome: Secondary | ICD-10-CM | POA: Diagnosis present

## 2019-10-20 DIAGNOSIS — Z955 Presence of coronary angioplasty implant and graft: Secondary | ICD-10-CM

## 2019-10-20 DIAGNOSIS — B9689 Other specified bacterial agents as the cause of diseases classified elsewhere: Secondary | ICD-10-CM | POA: Diagnosis present

## 2019-10-20 DIAGNOSIS — Z87891 Personal history of nicotine dependence: Secondary | ICD-10-CM

## 2019-10-20 DIAGNOSIS — J439 Emphysema, unspecified: Secondary | ICD-10-CM | POA: Diagnosis present

## 2019-10-20 HISTORY — DX: Fever, unspecified: R50.9

## 2019-10-20 LAB — CBC WITH DIFFERENTIAL/PLATELET
Abs Immature Granulocytes: 0.03 10*3/uL (ref 0.00–0.07)
Basophils Absolute: 0 10*3/uL (ref 0.0–0.1)
Basophils Relative: 0 %
Eosinophils Absolute: 0.1 10*3/uL (ref 0.0–0.5)
Eosinophils Relative: 1 %
HCT: 41.1 % (ref 39.0–52.0)
Hemoglobin: 13.3 g/dL (ref 13.0–17.0)
Immature Granulocytes: 0 %
Lymphocytes Relative: 10 %
Lymphs Abs: 0.7 10*3/uL (ref 0.7–4.0)
MCH: 28.6 pg (ref 26.0–34.0)
MCHC: 32.4 g/dL (ref 30.0–36.0)
MCV: 88.4 fL (ref 80.0–100.0)
Monocytes Absolute: 0.4 10*3/uL (ref 0.1–1.0)
Monocytes Relative: 6 %
Neutro Abs: 6.1 10*3/uL (ref 1.7–7.7)
Neutrophils Relative %: 83 %
Platelets: 188 10*3/uL (ref 150–400)
RBC: 4.65 MIL/uL (ref 4.22–5.81)
RDW: 14.1 % (ref 11.5–15.5)
WBC: 7.4 10*3/uL (ref 4.0–10.5)
nRBC: 0 % (ref 0.0–0.2)

## 2019-10-20 LAB — COMPREHENSIVE METABOLIC PANEL
ALT: 36 U/L (ref 0–44)
AST: 26 U/L (ref 15–41)
Albumin: 3.9 g/dL (ref 3.5–5.0)
Alkaline Phosphatase: 48 U/L (ref 38–126)
Anion gap: 11 (ref 5–15)
BUN: 22 mg/dL — ABNORMAL HIGH (ref 6–20)
CO2: 27 mmol/L (ref 22–32)
Calcium: 8.9 mg/dL (ref 8.9–10.3)
Chloride: 101 mmol/L (ref 98–111)
Creatinine, Ser: 1.07 mg/dL (ref 0.61–1.24)
GFR calc Af Amer: >60 mL/min (ref >60)
GFR calc non Af Amer: >60 mL/min (ref >60)
Glucose, Bld: 91 mg/dL (ref 70–99)
Potassium: 3.8 mmol/L (ref 3.5–5.1)
Sodium: 139 mmol/L (ref 135–145)
Total Bilirubin: 0.8 mg/dL (ref 0.3–1.2)
Total Protein: 7.4 g/dL (ref 6.5–8.1)

## 2019-10-20 LAB — SARS CORONAVIRUS 2 BY RT PCR (HOSPITAL ORDER, PERFORMED IN ~~LOC~~ HOSPITAL LAB): SARS Coronavirus 2: NEGATIVE

## 2019-10-20 LAB — TROPONIN I (HIGH SENSITIVITY)
Troponin I (High Sensitivity): 24 ng/L — ABNORMAL HIGH (ref <18)
Troponin I (High Sensitivity): 24 ng/L — ABNORMAL HIGH (ref <18)

## 2019-10-20 LAB — URINALYSIS, ROUTINE W REFLEX MICROSCOPIC
Bilirubin Urine: NEGATIVE
Glucose, UA: NEGATIVE mg/dL
Hgb urine dipstick: NEGATIVE
Ketones, ur: NEGATIVE mg/dL
Leukocytes,Ua: NEGATIVE
Nitrite: NEGATIVE
Protein, ur: NEGATIVE mg/dL
Specific Gravity, Urine: 1.044 — ABNORMAL HIGH (ref 1.005–1.030)
pH: 5 (ref 5.0–8.0)

## 2019-10-20 LAB — PROTIME-INR
INR: 1 (ref 0.8–1.2)
Prothrombin Time: 13 seconds (ref 11.4–15.2)

## 2019-10-20 LAB — APTT: aPTT: 28 seconds (ref 24–36)

## 2019-10-20 LAB — BRAIN NATRIURETIC PEPTIDE: B Natriuretic Peptide: 76 pg/mL (ref 0.0–100.0)

## 2019-10-20 LAB — LACTIC ACID, PLASMA: Lactic Acid, Venous: 1.8 mmol/L (ref 0.5–1.9)

## 2019-10-20 MED ORDER — ACETAMINOPHEN 325 MG PO TABS
650.0000 mg | ORAL_TABLET | Freq: Once | ORAL | Status: AC
  Administered 2019-10-21: 650 mg via ORAL
  Filled 2019-10-20: qty 2

## 2019-10-20 MED ORDER — SODIUM CHLORIDE 0.9 % IV SOLN
2.0000 g | Freq: Three times a day (TID) | INTRAVENOUS | Status: DC
  Administered 2019-10-21 (×2): 2 g via INTRAVENOUS
  Filled 2019-10-20 (×2): qty 2

## 2019-10-20 MED ORDER — VANCOMYCIN HCL IN DEXTROSE 1-5 GM/200ML-% IV SOLN: 1000.0000 mg | Freq: Once | INTRAVENOUS | Status: DC

## 2019-10-20 MED ORDER — ENOXAPARIN SODIUM 60 MG/0.6ML ~~LOC~~ SOLN
60.0000 mg | Freq: Every day | SUBCUTANEOUS | Status: DC
  Administered 2019-10-20 – 2019-10-25 (×6): 60 mg via SUBCUTANEOUS
  Filled 2019-10-20 (×5): qty 0.6

## 2019-10-20 MED ORDER — METRONIDAZOLE IN NACL 5-0.79 MG/ML-% IV SOLN
500.0000 mg | Freq: Once | INTRAVENOUS | Status: AC
  Administered 2019-10-20: 500 mg via INTRAVENOUS
  Filled 2019-10-20: qty 100

## 2019-10-20 MED ORDER — IOHEXOL 350 MG/ML SOLN
100.0000 mL | Freq: Once | INTRAVENOUS | Status: AC | PRN
  Administered 2019-10-20: 100 mL via INTRAVENOUS

## 2019-10-20 MED ORDER — VANCOMYCIN HCL 1250 MG/250ML IV SOLN
1250.0000 mg | Freq: Two times a day (BID) | INTRAVENOUS | Status: DC
  Administered 2019-10-20 – 2019-10-24 (×8): 1250 mg via INTRAVENOUS
  Filled 2019-10-20 (×8): qty 250

## 2019-10-20 MED ORDER — SODIUM CHLORIDE 0.9 % IV SOLN
2.0000 g | Freq: Once | INTRAVENOUS | Status: AC
  Administered 2019-10-20: 2 g via INTRAVENOUS
  Filled 2019-10-20: qty 2

## 2019-10-20 MED ORDER — SODIUM CHLORIDE 0.9 % IV BOLUS (SEPSIS)
1000.0000 mL | Freq: Once | INTRAVENOUS | Status: AC
  Administered 2019-10-20: 1000 mL via INTRAVENOUS

## 2019-10-20 MED ORDER — ONDANSETRON HCL 4 MG/2ML IJ SOLN
4.0000 mg | Freq: Once | INTRAMUSCULAR | Status: AC
  Administered 2019-10-20: 4 mg via INTRAVENOUS
  Filled 2019-10-20: qty 2

## 2019-10-20 NOTE — ED Notes (Signed)
Pt took tylenol pta at 6pm

## 2019-10-20 NOTE — Progress Notes (Signed)
Lovenox per Pharmacy for DVT Prophylaxis    Pharmacy has been consulted from dosing enoxaparin (lovenox) in this patient for DVT prophylaxis.  The pharmacist has reviewed pertinent labs (Hgb _13.3__; PLT_188__), patient weight (__133_kg) and renal function (CrCl_>90__mL/min) and decided that enoxaparin _60_mg SQ Q24Hrs is appropriate for this patient.  The pharmacy department will sign off at this time.  Please reconsult pharmacy if status changes or for further issues.  Thank you  Cyndia Diver PharmD, BCPS  10/20/2019, 11:26 PM

## 2019-10-20 NOTE — H&P (Signed)
History and Physical  Colin Lee FGH:[id number] DOB: 08/30/1960 DOA: 10/20/2019  Referring physician: Nuala Alpha, PA-C PCP: [name], MD  Patient coming from: Home  Chief Complaint: Fever  HPI: Colin Lee is a 59 y.o. male with medical history significant for hypertension, hyperlipidemia, CAD, GERD, COPD and morbid obesity who presents to the emergency department due to 2 to 3 days of increased fatigue.  Patient states that on returning from work today, he had shaking chills, fever with a temperature of 106F and a midsternal chest pain that was described as soreness and associated with shortness of breath.  Chest pain was nonradiating and was rated to be of moderate intensity with no alleviating/aggravating factors.  Patient states that he just came back from the beach after 2 to 3 days, he noted bilateral rash (sunburn) in distal thigh area (R > L), but he also noted a newly developed linear erythematous rash in RLE (nonpruritic with no knowledge of contact with poison oak/ivy, insect bite). Patient denies nausea, vomiting, abdominal pain or any known sick contact.  ED Course:  In the emergency department, temperature on arrival was 102.45F, he was tachycardic and tachypneic. Work-up in the ED showed normal CBC and BMP, SARS coronavirus 2 was negative, troponin x24, urinalysis was negative. CT negative chest showed no evidence of pulmonary embolus but showed emphysema with basilar predominant areas of scarring and fibrosis. Chest x-ray showed no acute cardiopulmonary abnormality. He was empirically started on IV cefepime, metronidazole and vancomycin. Hospitalist was asked to admit patient for further evaluation management.  Review of Systems:  Constitutional: Positive for chills and fever.  HENT: Negative for ear pain and sore throat.   Eyes: Negative for pain and visual disturbance.  Respiratory: Shortness of breath. Negative for cough, chest tightness    Cardiovascular: Positive for chest soreness (self) negative for palpitations.  Gastrointestinal: Negative for abdominal pain and vomiting.  Endocrine: Negative for polyphagia and polyuria.  Genitourinary: Negative for decreased urine volume, dysuria, enuresis, hematuria Musculoskeletal: Negative for arthralgias and back pain.  Skin: Negative for color change and rash.  Allergic/Immunologic: Negative for immunocompromised state.  Neurological: Negative for tremors, syncope, speech difficulty, weakness, light-headedness and headaches.  Hematological: Does not bruise/bleed easily.  All other systems reviewed and are negative    Past Medical History:  Diagnosis Date  . Allergy   . Arthritis   . Asthma   . Coronary artery disease 2016   90% RCA and 30% LM S/P PCI of RCA  . GERD (gastroesophageal reflux disease)   . Headache    stress and tension  . History of nuclear stress test    Myoview 1/19: EF 61, no ischemia, low risk  . Hyperlipidemia   . Hypertension   . Neuromuscular disorder (HCC)    restless legs and leg cramps  . Rheumatoid arthritis ([place]) 06/2019  . Shortness of breath    Starting May 2015  . Tubular adenoma of colon 03/2013   Past Surgical History:  Procedure Laterality Date  . CHEST TUBE INSERTION Left 02/01/2014   Procedure: INSERTION PLEURAL DRAINAGE CATHETER;  Surgeon: Grace Isaac, MD;  Location: [place];  Service: Thoracic;  Laterality: Left;  . COLONOSCOPY    . COLONOSCOPY W/ BIOPSIES AND POLYPECTOMY  2014   benign  . LEFT HEART CATHETERIZATION WITH CORONARY ANGIOGRAM N/A 06/07/2014   Procedure: LEFT HEART CATHETERIZATION WITH CORONARY ANGIOGRAM;  Surgeon: Peter M Martinique, MD;  Location: [facility] CATH LAB;  Service: Cardiovascular;  Laterality: N/A;  .  LEFT HEART CATHETERIZATION WITH CORONARY ANGIOGRAM N/A 06/08/2014   Procedure: LEFT HEART CATHETERIZATION WITH CORONARY ANGIOGRAM;  Surgeon: Sinclair Grooms, MD;  Location: [facility] CATH LAB;  Service: Cardiovascular;   Laterality: N/A;  . no prior surgery    . PLEURAL BIOPSY Left 02/01/2014   Procedure: PLEURAL BIOPSY;  Surgeon: Grace Isaac, MD;  Location: [place];  Service: Thoracic;  Laterality: Left;  . PLEURAL EFFUSION DRAINAGE Left 02/01/2014   Procedure: DRAINAGE OF PLEURAL EFFUSION;  Surgeon: Grace Isaac, MD;  Location: [place];  Service: Thoracic;  Laterality: Left;  . REMOVAL OF PLEURAL DRAINAGE CATHETER Left 02/16/2014   Procedure: REMOVAL OF PLEURAL DRAINAGE CATHETER;  Surgeon: Grace Isaac, MD;  Location: [place];  Service: Thoracic;  Laterality: Left;  . TALC PLEURODESIS Left 02/01/2014   Procedure: Pietro Cassis;  Surgeon: Grace Isaac, MD;  Location: [place];  Service: Thoracic;  Laterality: Left;  . THORACENTESIS Left 2015  . VIDEO ASSISTED THORACOSCOPY Left 02/01/2014   Procedure: VIDEO ASSISTED THORACOSCOPY;  Surgeon: Grace Isaac, MD;  Location: [place];  Service: Thoracic;  Laterality: Left;  Marland Kitchen VIDEO BRONCHOSCOPY N/A 02/01/2014   Procedure: VIDEO BRONCHOSCOPY;  Surgeon: Grace Isaac, MD;  Location: [place];  Service: Thoracic;  Laterality: N/A;    Social History:  reports that he quit smoking about 19 years ago. His smoking use included cigarettes. He has a 30.00 pack-year smoking history. He has never used smokeless tobacco. He reports current alcohol use. He reports that he does not use drugs.   No Known Allergies  Family History  Adopted: Yes  Problem Relation Age of Onset  . Colon cancer Neg Hx   . Esophageal cancer Neg Hx   . Rectal cancer Neg Hx   . Stomach cancer Neg Hx     Prior to Admission medications   Medication Sig Start Date End Date Taking? Authorizing Provider  acetaminophen (TYLENOL) 500 MG tablet Take 1,000 mg by mouth every 6 (six) hours as needed for moderate pain.   Yes [provider]  aspirin EC 81 MG tablet Take 81 mg by mouth in the morning.    Yes [provider]  atorvastatin (LIPITOR) 40 MG tablet One half  daily Patient taking differently: Take 20 mg by mouth in the morning.  03/29/19  Yes Tanda Rockers, MD  clopidogrel (PLAVIX) 75 MG tablet TAKE 1 TABLET BY MOUTH ONCE DAILY. Patient taking differently: Take 75 mg by mouth in the morning.  04/01/19  Yes Turner, Eber Hong, MD  famotidine (PEPCID) 20 MG tablet One after supper Patient taking differently: Take 29 mg by mouth at bedtime.  03/29/19  Yes Tanda Rockers, MD  losartan (COZAAR) 50 MG tablet TAKE (2) TABLETS BY MOUTH ONCE DAILY. Patient taking differently: Take 100 mg by mouth in the morning.  08/24/18  Yes Imogene Burn, PA-C  metoprolol tartrate (LOPRESSOR) 25 MG tablet Take 0.5 tablets (12.5 mg total) by mouth 2 (two) times daily. 06/09/14  Yes Hosie Poisson, MD  Multiple Vitamins-Minerals (CENTRUM ADULTS PO) Take 1 tablet by mouth daily.   Yes [provider]  nitroGLYCERIN (NITROSTAT) 0.4 MG SL tablet Place 1 tablet (0.4 mg total) under the tongue every 5 (five) minutes as needed for chest pain. 08/17/18 10/20/19 Yes Turner, Eber Hong, MD  Omega-3 Fatty Acids (FISH OIL) 1000 MG CPDR Take 4,000 mg by mouth daily. Patient taking differently: Take 2,000 mg by mouth in the morning and at  bedtime.  02/10/15  Yes Turner, Traci R, MD  pantoprazole (PROTONIX) 40 MG tablet TAKE 1 TABLET BY MOUTH ONCE DAILY. Patient taking differently: Take 40 mg by mouth in the morning.  06/21/19  Yes Turner, Eber Hong, MD  predniSONE (DELTASONE) 10 MG tablet Take 10 mg by mouth daily. 10/13/19  Yes [provider]    Physical Exam: BP (!) 143/76   Pulse (!) 105   Temp 99 F (37.2 C) (Oral)   Resp (!) 21   Ht 5\' 10"  (1.778 m)   Wt 133.4 kg   SpO2 95%   BMI 42.18 kg/m   . General: 59 y.o. year-old male well developed well nourished in no acute distress.  Alert and oriented x3. Marland Kitchen HEENT: NCAT, EOMI, PERRLA . Neck: Supple, trachea medial . Cardiovascular: Tachycardia. Regular rate and rhythm with no rubs or gallops.  No thyromegaly or JVD  noted.  No lower extremity edema. 2/4 pulses in all 4 extremities. Marland Kitchen Respiratory: Tachypnea. Clear to auscultation with no wheezes or rales. Good inspiratory effort. . Abdomen: Soft nontender nondistended with normal bowel sounds x4 quadrants. . Muskuloskeletal: No cyanosis, clubbing or edema noted bilaterally . Neuro: CN II-XII intact, strength, sensation, reflexes . Skin: No ulcerative lesions noted or rashes . Psychiatry: Judgement and insight appear normal. Mood is appropriate for condition and setting          Labs on Admission:  Basic Metabolic Panel: Recent Labs  Lab 10/20/19 2021  NA 139  K 3.8  CL 101  CO2 27  GLUCOSE 91  BUN 22*  CREATININE 1.07  CALCIUM 8.9   Liver Function Tests: Recent Labs  Lab 10/20/19 2021  AST 26  ALT 36  ALKPHOS 48  BILITOT 0.8  PROT 7.4  ALBUMIN 3.9   No results for input(s): LIPASE, AMYLASE in the last 168 hours. No results for input(s): AMMONIA in the last 168 hours. CBC: Recent Labs  Lab 10/20/19 2021  WBC 7.4  NEUTROABS 6.1  HGB 13.3  HCT 41.1  MCV 88.4  PLT 188   Cardiac Enzymes: No results for input(s): CKTOTAL, CKMB, CKMBINDEX, TROPONINI in the last 168 hours.  BNP (last 3 results) Recent Labs    10/20/19 2021  BNP 76.0    ProBNP (last 3 results) Recent Labs    03/29/19 1338  PROBNP 81.0    CBG: No results for input(s): GLUCAP in the last 168 hours.  Radiological Exams on Admission: CT Angio Chest PE W and/or Wo Contrast  Result Date: 10/20/2019 CLINICAL DATA:  Shortness of breath, weakness, fever EXAM: CT ANGIOGRAPHY CHEST WITH CONTRAST TECHNIQUE: Multidetector CT imaging of the chest was performed using the standard protocol during bolus administration of intravenous contrast. Multiplanar CT image reconstructions and MIPs were obtained to evaluate the vascular anatomy. CONTRAST:  171mL OMNIPAQUE IOHEXOL 350 MG/ML SOLN COMPARISON:  06/02/2014, 10/20/2019 FINDINGS: Cardiovascular: This is a technically  adequate evaluation of the pulmonary vasculature. No acute filling defects or pulmonary emboli. The heart and great vessels are unremarkable without pericardial effusion. Mild atherosclerosis of the aortic arch. Mediastinum/Nodes: No enlarged mediastinal, hilar, or axillary lymph nodes. Thyroid gland, trachea, and esophagus demonstrate no significant findings. Lungs/Pleura: Chronic areas of scarring and fibrosis are seen throughout the lungs, greatest in the left lower lobe. No acute airspace disease, effusion, or pneumothorax. Bullous emphysematous changes are seen at the lung apices. Upper Abdomen: Calcified gallstones are identified without cholecystitis. No other acute upper abdominal findings. Musculoskeletal: No acute or destructive bony  lesions. Reconstructed images demonstrate no additional findings. Review of the MIP images confirms the above findings. IMPRESSION: 1. No evidence of pulmonary embolus. 2. Emphysema, with basilar predominant areas of scarring and fibrosis. 3. Cholelithiasis without evidence of cholecystitis. 4. Aortic Atherosclerosis (ICD10-I70.0) and Emphysema (ICD10-J43.9). Electronically Signed   By: Randa Ngo M.D.   On: 10/20/2019 22:34   DG Chest Port 1 View  Result Date: 10/20/2019 CLINICAL DATA:  59 year old male with possible sepsis. Testing for COVID-19 pending. EXAM: PORTABLE CHEST 1 VIEW COMPARISON:  Chest radiographs 03/29/2019 and earlier. FINDINGS: Portable AP upright view at 2057 hours. Lung volumes and mediastinal contours are stable. Visualized tracheal air column is within normal limits. Mild chronic interstitial markings appear stable. Otherwise allowing for portable technique the lungs are clear. No acute osseous abnormality identified. IMPRESSION: No acute cardiopulmonary abnormality. Electronically Signed   By: Genevie Ann M.D.   On: 10/20/2019 21:12    EKG: I independently viewed the EKG done and my findings are as followed: Sinus tachycardia at a rate of 111  bpm  Assessment/Plan Present on Admission: . Acute febrile illness . Hypertension . CAD (coronary artery disease) . Dyslipidemia . Morbid obesity due to excess calories (HCC)  Principal Problem:   Acute febrile illness Active Problems:   Hypertension   Elevated troponin   CAD (coronary artery disease)   Dyslipidemia   Morbid obesity due to excess calories (HCC)   GERD (gastroesophageal reflux disease)   COPD (chronic obstructive pulmonary disease) (HCC)   Acute febrile illness with suspicion for sepsis Patient presents to the emergency department being febrile, tachycardic, tachypneic (meet SIRS criteria), lactic acid was normal and no known source of infection at this time, other than newly developed skin rash in RLE suspected to be possibly due to lymphangitis Patient was empirically started on IV vancomycin and cefepime, we shall continue with these at this time Blood culture pending Continue Tylenol 650 mg p.o. every 6 hours as needed for fever  Elevated troponin possibly secondary to demand ischemia Troponin x2 -24, patient denies chest pain at this time  Essential hypertension Continue metoprolol per home regimen  Dyslipidemia Continue Lipitor per home regimen  CAD s/p stent placement Continue aspirin, Plavix, Lopressor and statin  GERD Continue Protonix  COPD Stable,Continue prednisone per home regimen  Morbid obesity Patient was counseled about the cardiovascular and metabolic risk of morbid obesity. Patient was counseled for diet control, exercise regimen and weight loss.    DVT prophylaxis: Lovenox  Code Status: Full code  Family Communication: None at bedside  Disposition Plan:  Patient is from:                        home Anticipated DC to:                    home Anticipated DC date:               24 hours Anticipated DC barriers:          Patient is currently not medically stable for discharge   Consults called: None   Admission status:  Observation    Bernadette Hoit MD [facility] Pager [phone]  If 7PM-7AM, please contact night-coverage [email or web address] Password [email or web address]  10/21/2019, 1:01 AM

## 2019-10-20 NOTE — ED Triage Notes (Signed)
Pt c/o fever that began today with weakness. Fever at home 106 temporal and he took 1 gram of tylenol at 1800.

## 2019-10-20 NOTE — ED Provider Notes (Signed)
[facility] EMERGENCY DEPARTMENT Provider Note   CSN: [id number] Arrival date & time: 10/20/19  1942     History Chief Complaint  Patient presents with   Fever    Colin Lee is a 59 y.o. male history CAD, asthma, GERD, hypertension, hyperlipidemia, restless leg syndrome, rheumatoid arthritis, morbid obesity.  Patient [place] from the beach, over the last 2-3 days he has been feeling increasingly the fatigued.  No focal weakness.  He reports today he developed shaking chills and had a fever at home of 106 F he took Tylenol prior to arrival.  He reports simultaneously with onset of shaking chills he developed chest pain and shortness of breath.  Shortness of breath described as a difficulty catching his breath chest pain is central aching sensation constant moderate intensity nonradiating no clear aggravating or alleviating factors.  He reports mild nonproductive cough.  Additionally reports nausea without vomiting.  Denies fall/injury, headache, neck stiffness, difficulty swallowing, sore throat, hemoptysis, abdominal pain, vomiting/diarrhea, extremity swelling/color change, dysuria/hematuria, sick contacts or any additional concerns.  Of note patient has not had a COVID-19 vaccine.  HPI     Past Medical History:  Diagnosis Date   Allergy    Arthritis    Asthma    Coronary artery disease 2016   90% RCA and 30% LM S/P PCI of RCA   GERD (gastroesophageal reflux disease)    Headache    stress and tension   History of nuclear stress test    Myoview 1/19: EF 61, no ischemia, low risk   Hyperlipidemia    Hypertension    Neuromuscular disorder (HCC)    restless legs and leg cramps   Rheumatoid arthritis ([place]) 06/2019   Shortness of breath    Starting May 2015   Tubular adenoma of colon 03/2013    Patient Active Problem List   Diagnosis Date Noted   Acute febrile illness 10/20/2019   Morbid obesity due to excess calories ([place]) 04/01/2019     Claudication ([place]) 04/15/2016   CAD (coronary artery disease) 06/20/2014   Dyslipidemia 06/20/2014   Stented coronary artery    Myalgia 06/03/2014   Arthralgia 06/03/2014   Normocytic anemia 06/03/2014   Chest pain 05/31/2014   DOE (dyspnea on exertion)    Restless leg syndrome 12/28/2013   Cholelithiases 10/26/2013   Hypertension 10/06/2013    Past Surgical History:  Procedure Laterality Date   CHEST TUBE INSERTION Left 02/01/2014   Procedure: INSERTION PLEURAL DRAINAGE CATHETER;  Surgeon: Grace Isaac, MD;  Location: [place];  Service: Thoracic;  Laterality: Left;   COLONOSCOPY     COLONOSCOPY W/ BIOPSIES AND POLYPECTOMY  2014   benign   LEFT HEART CATHETERIZATION WITH CORONARY ANGIOGRAM N/A 06/07/2014   Procedure: LEFT HEART CATHETERIZATION WITH CORONARY ANGIOGRAM;  Surgeon: Peter M Martinique, MD;  Location: [facility] CATH LAB;  Service: Cardiovascular;  Laterality: N/A;   LEFT HEART CATHETERIZATION WITH CORONARY ANGIOGRAM N/A 06/08/2014   Procedure: LEFT HEART CATHETERIZATION WITH CORONARY ANGIOGRAM;  Surgeon: Sinclair Grooms, MD;  Location: [facility] CATH LAB;  Service: Cardiovascular;  Laterality: N/A;   no prior surgery     PLEURAL BIOPSY Left 02/01/2014   Procedure: PLEURAL BIOPSY;  Surgeon: Grace Isaac, MD;  Location: [place];  Service: Thoracic;  Laterality: Left;   PLEURAL EFFUSION DRAINAGE Left 02/01/2014   Procedure: DRAINAGE OF PLEURAL EFFUSION;  Surgeon: Grace Isaac, MD;  Location: [place];  Service: Thoracic;  Laterality: Left;   REMOVAL OF  PLEURAL DRAINAGE CATHETER Left 02/16/2014   Procedure: REMOVAL OF PLEURAL DRAINAGE CATHETER;  Surgeon: Grace Isaac, MD;  Location: [place];  Service: Thoracic;  Laterality: Left;   TALC PLEURODESIS Left 02/01/2014   Procedure: Pietro Cassis;  Surgeon: Grace Isaac, MD;  Location: [place];  Service: Thoracic;  Laterality: Left;   THORACENTESIS Left 2015   VIDEO ASSISTED THORACOSCOPY Left 02/01/2014    Procedure: VIDEO ASSISTED THORACOSCOPY;  Surgeon: Grace Isaac, MD;  Location: [place];  Service: Thoracic;  Laterality: Left;   VIDEO BRONCHOSCOPY N/A 02/01/2014   Procedure: VIDEO BRONCHOSCOPY;  Surgeon: Grace Isaac, MD;  Location: [place];  Service: Thoracic;  Laterality: N/A;       Family History  Adopted: Yes  Problem Relation Age of Onset   Colon cancer Neg Hx    Esophageal cancer Neg Hx    Rectal cancer Neg Hx    Stomach cancer Neg Hx     Social History   Tobacco Use   Smoking status: Former Smoker    Packs/day: 1.00    Years: 30.00    Pack years: 30.00    Types: Cigarettes    Quit date: 04/08/2000    Years since quitting: 19.5   Smokeless tobacco: Never Used  Vaping Use   Vaping Use: Never used  Substance Use Topics   Alcohol use: Yes    Comment: rare   Drug use: No    Home Medications Prior to Admission medications   Medication Sig Start Date End Date Taking? Authorizing Provider  acetaminophen (TYLENOL) 500 MG tablet Take 1,000 mg by mouth every 6 (six) hours as needed for moderate pain.   Yes [provider]  aspirin EC 81 MG tablet Take 81 mg by mouth in the morning.    Yes [provider]  atorvastatin (LIPITOR) 40 MG tablet One half daily Patient taking differently: Take 20 mg by mouth in the morning.  03/29/19  Yes Tanda Rockers, MD  clopidogrel (PLAVIX) 75 MG tablet TAKE 1 TABLET BY MOUTH ONCE DAILY. Patient taking differently: Take 75 mg by mouth in the morning.  04/01/19  Yes Turner, Eber Hong, MD  famotidine (PEPCID) 20 MG tablet One after supper Patient taking differently: Take 29 mg by mouth at bedtime.  03/29/19  Yes Tanda Rockers, MD  losartan (COZAAR) 50 MG tablet TAKE (2) TABLETS BY MOUTH ONCE DAILY. Patient taking differently: Take 100 mg by mouth in the morning.  08/24/18  Yes Imogene Burn, PA-C  metoprolol tartrate (LOPRESSOR) 25 MG tablet Take 0.5 tablets (12.5 mg total) by mouth 2 (two) times daily.  06/09/14  Yes Hosie Poisson, MD  Multiple Vitamins-Minerals (CENTRUM ADULTS PO) Take 1 tablet by mouth daily.   Yes [provider]  nitroGLYCERIN (NITROSTAT) 0.4 MG SL tablet Place 1 tablet (0.4 mg total) under the tongue every 5 (five) minutes as needed for chest pain. 08/17/18 10/20/19 Yes Turner, Eber Hong, MD  Omega-3 Fatty Acids (FISH OIL) 1000 MG CPDR Take 4,000 mg by mouth daily. Patient taking differently: Take 2,000 mg by mouth in the morning and at bedtime.  02/10/15  Yes Turner, Traci R, MD  pantoprazole (PROTONIX) 40 MG tablet TAKE 1 TABLET BY MOUTH ONCE DAILY. Patient taking differently: Take 40 mg by mouth in the morning.  06/21/19  Yes Turner, Eber Hong, MD  predniSONE (DELTASONE) 10 MG tablet Take 10 mg by mouth daily. 10/13/19  Yes [provider]    Allergies  Patient has no known allergies.  Review of Systems   Review of Systems Ten systems are reviewed and are negative for acute change except as noted in the HPI  Physical Exam Updated Vital Signs BP 127/63    Pulse (!) 110    Temp 98.8 F (37.1 C)    Resp 18    Ht 5\' 10"  (1.778 m)    Wt 133.4 kg    SpO2 97%    BMI 42.18 kg/m   Physical Exam Constitutional:      General: He is not in acute distress.    Appearance: Normal appearance. He is well-developed. He is obese. He is ill-appearing. He is not diaphoretic.  HENT:     Head: Normocephalic and atraumatic.     Right Ear: External ear normal.     Left Ear: External ear normal.  Eyes:     General: Vision grossly intact. Gaze aligned appropriately.     Pupils: Pupils are equal, round, and reactive to light.  Neck:     Trachea: Trachea and phonation normal.  Cardiovascular:     Rate and Rhythm: Regular rhythm. Tachycardia present.  Pulmonary:     Effort: Pulmonary effort is normal. Tachypnea present. No respiratory distress.     Breath sounds: Normal breath sounds and air entry.  Abdominal:     General: There is no distension.     Palpations: Abdomen  is soft.     Tenderness: There is no abdominal tenderness. There is no guarding or rebound.  Musculoskeletal:        General: Normal range of motion.     Cervical back: Normal range of motion.  Skin:    General: Skin is warm and dry.  Neurological:     Mental Status: He is alert.     GCS: GCS eye subscore is 4. GCS verbal subscore is 5. GCS motor subscore is 6.     Comments: Speech is clear and goal oriented, follows commands Major Cranial nerves without deficit, no facial droop Moves extremities without ataxia, coordination intact  Psychiatric:        Behavior: Behavior normal.     ED Results / Procedures / Treatments   Labs (all labs ordered are listed, but only abnormal results are displayed) Labs Reviewed  COMPREHENSIVE METABOLIC PANEL - Abnormal; Notable for the following components:      Result Value   BUN 22 (*)    All other components within normal limits  URINALYSIS, ROUTINE W REFLEX MICROSCOPIC - Abnormal; Notable for the following components:   Specific Gravity, Urine 1.044 (*)    All other components within normal limits  TROPONIN I (HIGH SENSITIVITY) - Abnormal; Notable for the following components:   Troponin I (High Sensitivity) 24 (*)    All other components within normal limits  TROPONIN I (HIGH SENSITIVITY) - Abnormal; Notable for the following components:   Troponin I (High Sensitivity) 24 (*)    All other components within normal limits  SARS CORONAVIRUS 2 BY RT PCR (HOSPITAL ORDER, [place] [facility])  CULTURE, BLOOD (ROUTINE X 2)  CULTURE, BLOOD (ROUTINE X 2)  URINE CULTURE  LACTIC ACID, PLASMA  CBC WITH DIFFERENTIAL/PLATELET  APTT  PROTIME-INR  BRAIN NATRIURETIC PEPTIDE  HIV ANTIBODY (ROUTINE TESTING W REFLEX)  COMPREHENSIVE METABOLIC PANEL  CBC  PROTIME-INR  APTT  MAGNESIUM  PHOSPHORUS    EKG EKG Interpretation  Date/Time:  Wednesday October 20 2019 21:14:47 EDT Ventricular Rate:  111 PR Interval:  130  QRS  Duration: 88 QT Interval:  310 QTC Calculation: 422 R Axis:   24 Text Interpretation: duplicate, discard Confirmed by Delora Fuel ([id number]) on 10/20/2019 11:38:59 PM   Radiology CT Angio Chest PE W and/or Wo Contrast  Result Date: 10/20/2019 CLINICAL DATA:  Shortness of breath, weakness, fever EXAM: CT ANGIOGRAPHY CHEST WITH CONTRAST TECHNIQUE: Multidetector CT imaging of the chest was performed using the standard protocol during bolus administration of intravenous contrast. Multiplanar CT image reconstructions and MIPs were obtained to evaluate the vascular anatomy. CONTRAST:  160mL OMNIPAQUE IOHEXOL 350 MG/ML SOLN COMPARISON:  06/02/2014, 10/20/2019 FINDINGS: Cardiovascular: This is a technically adequate evaluation of the pulmonary vasculature. No acute filling defects or pulmonary emboli. The heart and great vessels are unremarkable without pericardial effusion. Mild atherosclerosis of the aortic arch. Mediastinum/Nodes: No enlarged mediastinal, hilar, or axillary lymph nodes. Thyroid gland, trachea, and esophagus demonstrate no significant findings. Lungs/Pleura: Chronic areas of scarring and fibrosis are seen throughout the lungs, greatest in the left lower lobe. No acute airspace disease, effusion, or pneumothorax. Bullous emphysematous changes are seen at the lung apices. Upper Abdomen: Calcified gallstones are identified without cholecystitis. No other acute upper abdominal findings. Musculoskeletal: No acute or destructive bony lesions. Reconstructed images demonstrate no additional findings. Review of the MIP images confirms the above findings. IMPRESSION: 1. No evidence of pulmonary embolus. 2. Emphysema, with basilar predominant areas of scarring and fibrosis. 3. Cholelithiasis without evidence of cholecystitis. 4. Aortic Atherosclerosis (ICD10-I70.0) and Emphysema (ICD10-J43.9). Electronically Signed   By: Randa Ngo M.D.   On: 10/20/2019 22:34   DG Chest Port 1 View  Result Date:  10/20/2019 CLINICAL DATA:  59 year old male with possible sepsis. Testing for COVID-19 pending. EXAM: PORTABLE CHEST 1 VIEW COMPARISON:  Chest radiographs 03/29/2019 and earlier. FINDINGS: Portable AP upright view at 2057 hours. Lung volumes and mediastinal contours are stable. Visualized tracheal air column is within normal limits. Mild chronic interstitial markings appear stable. Otherwise allowing for portable technique the lungs are clear. No acute osseous abnormality identified. IMPRESSION: No acute cardiopulmonary abnormality. Electronically Signed   By: Genevie Ann M.D.   On: 10/20/2019 21:12    Procedures .Critical Care Performed by: Deliah Boston, PA-C Authorized by: Deliah Boston, PA-C   Critical care provider statement:    Critical care time (minutes):  45   Critical care was necessary to treat or prevent imminent or life-threatening deterioration of the following conditions:  Sepsis   Critical care was time spent personally by me on the following activities:  Discussions with consultants, evaluation of patient's response to treatment, examination of patient, ordering and performing treatments and interventions, ordering and review of laboratory studies, ordering and review of radiographic studies, pulse oximetry, re-evaluation of patient's condition, obtaining history from patient or surrogate and review of old charts   (including critical care time)  Medications Ordered in ED Medications  acetaminophen (TYLENOL) tablet 650 mg (650 mg Oral Not Given 10/20/19 2041)  vancomycin (VANCOREADY) IVPB 1250 mg/250 mL (1,250 mg Intravenous New Bag/Given 10/20/19 2341)  ceFEPIme (MAXIPIME) 2 g in sodium chloride 0.9 % 100 mL IVPB (has no administration in time range)  enoxaparin (LOVENOX) injection 60 mg (60 mg Subcutaneous Given 10/20/19 2338)  sodium chloride 0.9 % bolus 1,000 mL (0 mLs Intravenous Stopped 10/20/19 2237)  ceFEPIme (MAXIPIME) 2 g in sodium chloride 0.9 % 100 mL IVPB (0 g  Intravenous Stopped 10/20/19 2148)  metroNIDAZOLE (FLAGYL) IVPB 500 mg (0 mg Intravenous Stopped 10/20/19 2306)  iohexol (  OMNIPAQUE) 350 MG/ML injection 100 mL (100 mLs Intravenous Contrast Given 10/20/19 2214)  ondansetron (ZOFRAN) injection 4 mg (4 mg Intravenous Given 10/20/19 2210)    ED Course  I have reviewed the triage vital signs and the nursing notes.  Pertinent labs & imaging results that were available during my care of the patient were reviewed by me and considered in my medical decision making (see chart for details).  Clinical Course as of Oct 20 2355  Wed Oct 20, 2019  2318 Hospitalist   [BM]    Clinical Course User Index [BM] Gari Crown   MDM Rules/Calculators/A&P                         TEDD COTTRILL was evaluated in Emergency Department on 10/20/2019 for the symptoms described in the history of present illness. He was evaluated in the context of the global COVID-19 pandemic, which necessitated consideration that the patient might be at risk for infection with the SARS-CoV-2 virus that causes COVID-19. Institutional protocols and algorithms that pertain to the evaluation of patients at risk for COVID-19 are in a state of rapid change based on information released by regulatory bodies including the CDC and federal and state organizations. These policies and algorithms were followed during the patient's care in the ED.  Additional History Obtained: 1. Nursing notes from this visit. 2. Family, patient's wife at bedside. -------- 59 year old male arrives for 2-3-day history of fatigue, developed shaking chills chest pain shortness of breath and nonproductive cough today.  Temperature of 106 F reported by patient at home.  He took Tylenol prior to arrival.  On arrival to the ED he is febrile to 102.9 F, tachycardic and tachypneic.  Upon initial evaluation code sepsis was initiated, no clear source at this time so broad-spectrum antibiotics for unknown source  were ordered.  Patient not hypotensive, fluid bolus was ordered but due to patient's morbid obesity full 30 cc/kg bolus was avoided.  Will monitor blood pressure.  There is also concern for possible COVID-19 infection as patient is not vaccinated and has recent travel.  Chest x-ray ordered along with Covid test and remainder of sepsis work-up.  Troponins were added due to chest pain along with a BMP.  Abdomen soft nontender, doubt intra-abdominal pathology at this time. - I ordered, reviewed and interpreted labs which include: Covid test negative, possible false negative however will defer to hospitalist for further testing if indicated. BNP within normal limits, patient does not appear to be in acute heart failure. APTT and PT/INR within normal limits High-sensitivity troponin is 22 Delta high-sensitivity troponin is 24, flat, possible elevation secondary to tachycardia and demand.  EKG reviewed with Dr. Verner Chol shows no acute ischemic changes. CMP shows no emergent Electra derangement, evidence of acute kidney injury, emergent elevation of LFTs or gap. Lactic of 1.8 is reassuring. CBC within normal limits, no leukocytosis or anemia.  CXR:    IMPRESSION:  No acute cardiopulmonary abnormality.  I personally patient's chest x-ray and agree with radiologist interpretation.  Given the negative Covid test there was concern for possible pulmonary embolism as cause of fever shortness of breath and chest pain along with elevated troponin.  CT angio PE study was ordered, additionally this would give [place] hospital better evaluation of a pneumonia.  CT Angio PE Study:  IMPRESSION:  1. No evidence of pulmonary embolus.  2. Emphysema, with basilar predominant areas of scarring and  fibrosis.  3. Cholelithiasis  without evidence of cholecystitis.  4. Aortic Atherosclerosis (ICD10-I70.0) and Emphysema (ICD10-J43.9).  - Patient reassessed resting comfortably appears improved, he appears he is feeling  somewhat better.  Temperature is improving.  Patient will need admission to the hospital for fever of unknown origin and sepsis.  I discussed the case with Dr. Josephine Cables who has accepted patient to hospitalist service.  Patient reassessed multiple times remains stable he is agreeable for admission.   Note: Portions of this report may have been transcribed using voice recognition software. Every effort was made to ensure accuracy; however, inadvertent computerized transcription errors may still be present. Final Clinical Impression(s) / ED Diagnoses Final diagnoses:  Sepsis without acute organ dysfunction, due to unspecified organism [facility])  Elevated troponin    Rx / DC Orders ED Discharge Orders    None       Gari Crown 10/20/19 2357    Sherwood Gambler, MD 10/21/19 1759

## 2019-10-20 NOTE — Progress Notes (Signed)
Pharmacy Antibiotic Note  Colin Lee is a 59 y.o. male admitted on 10/20/2019 with fever - unknown source.  Pharmacy has been consulted for vancomycin and cefepime dosing.  Plan: Vancomycin 1250 mg IV every 12 hours Cefepime 2 g IV every 8 hours Monitor clinical picture, renal function, vancomycin trough if indicated F/U C&S, abx deescalation / LOT   Height: 5\' 10"  (177.8 cm) Weight: 133.4 kg (294 lb) IBW/kg (Calculated) : 73  Temp (24hrs), Avg:101.2 F (38.4 C), Min:99.5 F (37.5 C), Max:102.9 F (39.4 C)  Recent Labs  Lab 10/20/19 2021  WBC 7.4  LATICACIDVEN 1.8   Current CrCl cannot be calculated (Patient's most recent lab result is older than the maximum 21 days allowed.).   03/29/2019 Serum creatinine 0.99 and est CrCl > 100 ml/min using AdjBW of 97 kg  No Known Allergies  Microbiology results: 10/20/2019 BCx: sent  Thank you for allowing pharmacy to be a part of this patient's care.  Efraim Kaufmann, PharmD, BCPS 10/20/2019 9:06 PM

## 2019-10-21 DIAGNOSIS — Z955 Presence of coronary angioplasty implant and graft: Secondary | ICD-10-CM | POA: Diagnosis not present

## 2019-10-21 DIAGNOSIS — I10 Essential (primary) hypertension: Secondary | ICD-10-CM | POA: Diagnosis present

## 2019-10-21 DIAGNOSIS — Z7952 Long term (current) use of systemic steroids: Secondary | ICD-10-CM | POA: Diagnosis not present

## 2019-10-21 DIAGNOSIS — M069 Rheumatoid arthritis, unspecified: Secondary | ICD-10-CM | POA: Diagnosis present

## 2019-10-21 DIAGNOSIS — E785 Hyperlipidemia, unspecified: Secondary | ICD-10-CM | POA: Diagnosis present

## 2019-10-21 DIAGNOSIS — Z7982 Long term (current) use of aspirin: Secondary | ICD-10-CM | POA: Diagnosis not present

## 2019-10-21 DIAGNOSIS — B9689 Other specified bacterial agents as the cause of diseases classified elsewhere: Secondary | ICD-10-CM | POA: Diagnosis present

## 2019-10-21 DIAGNOSIS — R21 Rash and other nonspecific skin eruption: Secondary | ICD-10-CM | POA: Diagnosis not present

## 2019-10-21 DIAGNOSIS — R778 Other specified abnormalities of plasma proteins: Secondary | ICD-10-CM | POA: Diagnosis present

## 2019-10-21 DIAGNOSIS — Z79899 Other long term (current) drug therapy: Secondary | ICD-10-CM | POA: Diagnosis not present

## 2019-10-21 DIAGNOSIS — K219 Gastro-esophageal reflux disease without esophagitis: Secondary | ICD-10-CM | POA: Diagnosis present

## 2019-10-21 DIAGNOSIS — G2581 Restless legs syndrome: Secondary | ICD-10-CM | POA: Diagnosis present

## 2019-10-21 DIAGNOSIS — J449 Chronic obstructive pulmonary disease, unspecified: Secondary | ICD-10-CM

## 2019-10-21 DIAGNOSIS — I2510 Atherosclerotic heart disease of native coronary artery without angina pectoris: Secondary | ICD-10-CM | POA: Diagnosis present

## 2019-10-21 DIAGNOSIS — J439 Emphysema, unspecified: Secondary | ICD-10-CM | POA: Diagnosis present

## 2019-10-21 DIAGNOSIS — Z7902 Long term (current) use of antithrombotics/antiplatelets: Secondary | ICD-10-CM | POA: Diagnosis not present

## 2019-10-21 DIAGNOSIS — L02415 Cutaneous abscess of right lower limb: Secondary | ICD-10-CM | POA: Diagnosis present

## 2019-10-21 DIAGNOSIS — Z6841 Body Mass Index (BMI) 40.0 and over, adult: Secondary | ICD-10-CM | POA: Diagnosis not present

## 2019-10-21 DIAGNOSIS — Z87891 Personal history of nicotine dependence: Secondary | ICD-10-CM | POA: Diagnosis not present

## 2019-10-21 DIAGNOSIS — Z20822 Contact with and (suspected) exposure to covid-19: Secondary | ICD-10-CM | POA: Diagnosis present

## 2019-10-21 DIAGNOSIS — R7881 Bacteremia: Secondary | ICD-10-CM | POA: Diagnosis not present

## 2019-10-21 DIAGNOSIS — I248 Other forms of acute ischemic heart disease: Secondary | ICD-10-CM | POA: Diagnosis present

## 2019-10-21 DIAGNOSIS — L03115 Cellulitis of right lower limb: Secondary | ICD-10-CM | POA: Diagnosis present

## 2019-10-21 HISTORY — DX: Bacteremia: R78.81

## 2019-10-21 HISTORY — DX: Cellulitis of right lower limb: L03.115

## 2019-10-21 HISTORY — DX: Chronic obstructive pulmonary disease, unspecified: J44.9

## 2019-10-21 HISTORY — DX: Cutaneous abscess of right lower limb: L02.415

## 2019-10-21 LAB — BLOOD CULTURE ID PANEL (REFLEXED)

## 2019-10-21 LAB — PHOSPHORUS: Phosphorus: 3.9 mg/dL (ref 2.5–4.6)

## 2019-10-21 LAB — CBC
HCT: 40 % (ref 39.0–52.0)
Hemoglobin: 12.9 g/dL — ABNORMAL LOW (ref 13.0–17.0)
MCH: 28.4 pg (ref 26.0–34.0)
MCHC: 32.3 g/dL (ref 30.0–36.0)
MCV: 88.1 fL (ref 80.0–100.0)
Platelets: 167 10*3/uL (ref 150–400)
RBC: 4.54 MIL/uL (ref 4.22–5.81)
RDW: 14.3 % (ref 11.5–15.5)
WBC: 6.1 10*3/uL (ref 4.0–10.5)
nRBC: 0 % (ref 0.0–0.2)

## 2019-10-21 LAB — COMPREHENSIVE METABOLIC PANEL
ALT: 32 U/L (ref 0–44)
AST: 23 U/L (ref 15–41)
Albumin: 3.5 g/dL (ref 3.5–5.0)
Alkaline Phosphatase: 43 U/L (ref 38–126)
Anion gap: 10 (ref 5–15)
BUN: 18 mg/dL (ref 6–20)
CO2: 25 mmol/L (ref 22–32)
Calcium: 8.7 mg/dL — ABNORMAL LOW (ref 8.9–10.3)
Chloride: 101 mmol/L (ref 98–111)
Creatinine, Ser: 0.97 mg/dL (ref 0.61–1.24)
GFR calc Af Amer: >60 mL/min (ref >60)
GFR calc non Af Amer: >60 mL/min (ref >60)
Glucose, Bld: 83 mg/dL (ref 70–99)
Potassium: 3.9 mmol/L (ref 3.5–5.1)
Sodium: 136 mmol/L (ref 135–145)
Total Bilirubin: 0.7 mg/dL (ref 0.3–1.2)
Total Protein: 6.9 g/dL (ref 6.5–8.1)

## 2019-10-21 LAB — PROTIME-INR
INR: 1.1 (ref 0.8–1.2)
Prothrombin Time: 13.3 seconds (ref 11.4–15.2)

## 2019-10-21 LAB — SEDIMENTATION RATE: Sed Rate: 35 mm/hr — ABNORMAL HIGH (ref 0–16)

## 2019-10-21 LAB — APTT: aPTT: 34 seconds (ref 24–36)

## 2019-10-21 LAB — C-REACTIVE PROTEIN: CRP: 17.4 mg/dL — ABNORMAL HIGH (ref <1.0)

## 2019-10-21 LAB — PROCALCITONIN: Procalcitonin: <0.1 ng/mL

## 2019-10-21 LAB — HIV ANTIBODY (ROUTINE TESTING W REFLEX): HIV Screen 4th Generation wRfx: NONREACTIVE

## 2019-10-21 LAB — MAGNESIUM: Magnesium: 1.7 mg/dL (ref 1.7–2.4)

## 2019-10-21 MED ORDER — PREDNISONE 10 MG PO TABS
10.0000 mg | ORAL_TABLET | Freq: Once | ORAL | Status: AC
  Administered 2019-10-21: 10 mg via ORAL
  Filled 2019-10-21: qty 1

## 2019-10-21 MED ORDER — PANTOPRAZOLE SODIUM 40 MG PO TBEC
40.0000 mg | DELAYED_RELEASE_TABLET | Freq: Every day | ORAL | Status: DC
  Administered 2019-10-21 – 2019-10-26 (×6): 40 mg via ORAL
  Filled 2019-10-21 (×6): qty 1

## 2019-10-21 MED ORDER — PREDNISONE 10 MG PO TABS
10.0000 mg | ORAL_TABLET | Freq: Every day | ORAL | Status: DC
  Administered 2019-10-21: 10 mg via ORAL
  Filled 2019-10-21: qty 1

## 2019-10-21 MED ORDER — CLOPIDOGREL BISULFATE 75 MG PO TABS
75.0000 mg | ORAL_TABLET | Freq: Every day | ORAL | Status: DC
  Administered 2019-10-21 – 2019-10-24 (×4): 75 mg via ORAL
  Filled 2019-10-21 (×4): qty 1

## 2019-10-21 MED ORDER — NITROGLYCERIN 0.4 MG SL SUBL: 0.4000 mg | SUBLINGUAL_TABLET | SUBLINGUAL | Status: DC | PRN

## 2019-10-21 MED ORDER — UMECLIDINIUM BROMIDE 62.5 MCG/INH IN AEPB
1.0000 | INHALATION_SPRAY | Freq: Every day | RESPIRATORY_TRACT | Status: DC
  Administered 2019-10-22 – 2019-10-26 (×5): 1 via RESPIRATORY_TRACT
  Filled 2019-10-21: qty 7

## 2019-10-21 MED ORDER — METOPROLOL TARTRATE 25 MG PO TABS
12.5000 mg | ORAL_TABLET | Freq: Two times a day (BID) | ORAL | Status: DC
  Administered 2019-10-21 – 2019-10-26 (×12): 12.5 mg via ORAL
  Filled 2019-10-21 (×12): qty 1

## 2019-10-21 MED ORDER — ALBUTEROL SULFATE (2.5 MG/3ML) 0.083% IN NEBU
2.5000 mg | INHALATION_SOLUTION | RESPIRATORY_TRACT | Status: DC | PRN
  Administered 2019-10-22 – 2019-10-24 (×3): 2.5 mg via RESPIRATORY_TRACT
  Filled 2019-10-21 (×4): qty 3

## 2019-10-21 MED ORDER — TIOTROPIUM BROMIDE MONOHYDRATE 18 MCG IN CAPS: 18.0000 ug | ORAL_CAPSULE | Freq: Every day | RESPIRATORY_TRACT | Status: DC

## 2019-10-21 MED ORDER — PREDNISONE 20 MG PO TABS
20.0000 mg | ORAL_TABLET | Freq: Every day | ORAL | Status: DC
  Administered 2019-10-22 – 2019-10-26 (×5): 20 mg via ORAL
  Filled 2019-10-21 (×5): qty 1

## 2019-10-21 MED ORDER — VANCOMYCIN HCL IN DEXTROSE 1-5 GM/200ML-% IV SOLN: 1000.0000 mg | Freq: Once | INTRAVENOUS | Status: DC

## 2019-10-21 MED ORDER — ASPIRIN EC 81 MG PO TBEC
81.0000 mg | DELAYED_RELEASE_TABLET | Freq: Every day | ORAL | Status: DC
  Administered 2019-10-21 – 2019-10-26 (×6): 81 mg via ORAL
  Filled 2019-10-21 (×6): qty 1

## 2019-10-21 MED ORDER — SODIUM CHLORIDE 0.9 % IV SOLN: 2.0000 g | Freq: Three times a day (TID) | INTRAVENOUS | Status: DC

## 2019-10-21 MED ORDER — PIPERACILLIN-TAZOBACTAM 3.375 G IVPB 30 MIN
3.3750 g | Freq: Once | INTRAVENOUS | Status: DC
Start: 2019-10-21 — End: 2019-10-21

## 2019-10-21 MED ORDER — MAGNESIUM SULFATE 2 GM/50ML IV SOLN
2.0000 g | Freq: Once | INTRAVENOUS | Status: AC
  Administered 2019-10-21: 2 g via INTRAVENOUS
  Filled 2019-10-21: qty 50

## 2019-10-21 MED ORDER — ACETAMINOPHEN 325 MG PO TABS
650.0000 mg | ORAL_TABLET | Freq: Four times a day (QID) | ORAL | Status: DC | PRN
  Administered 2019-10-21 – 2019-10-26 (×14): 650 mg via ORAL
  Filled 2019-10-21 (×15): qty 2

## 2019-10-21 MED ORDER — ATORVASTATIN CALCIUM 20 MG PO TABS
20.0000 mg | ORAL_TABLET | Freq: Every day | ORAL | Status: DC
  Administered 2019-10-21 – 2019-10-26 (×6): 20 mg via ORAL
  Filled 2019-10-21 (×6): qty 1

## 2019-10-21 MED ORDER — PIPERACILLIN-TAZOBACTAM 3.375 G IVPB
3.3750 g | Freq: Three times a day (TID) | INTRAVENOUS | Status: DC
  Administered 2019-10-21 – 2019-10-24 (×9): 3.375 g via INTRAVENOUS
  Filled 2019-10-21 (×9): qty 50

## 2019-10-21 MED ORDER — ACETAMINOPHEN 325 MG PO TABS
650.0000 mg | ORAL_TABLET | Freq: Once | ORAL | Status: AC
  Administered 2019-10-21: 650 mg via ORAL
  Filled 2019-10-21: qty 2

## 2019-10-21 NOTE — Progress Notes (Signed)
CRITICAL VALUE ALERT  Critical Value:  Gram positive cocci of staph species  Date & Time Notied:  10/21/19, 6269S  Provider Notified: Dr. Cloyd Stagers  Orders Received/Actions taken: No further orders at this time.

## 2019-10-21 NOTE — Progress Notes (Signed)
Patient Demographics:    Colin Lee, is a 59 y.o. male, DOB - [birth year], HWE:[id number]  Admit date - 10/20/2019   Admitting Physician Isel Skufca Denton Brick, MD  Outpatient Primary MD for the patient is [name], MD  LOS - 0   Chief Complaint  Patient presents with  . Fever        Subjective:    Colin Lee today has no emesis,  No chest pain,   --Patient with persistently high fevers, episodes of tachycardia with fevers -Right medial leg pain and redness noted  Assessment  & Plan :    Principal Problem:   Bacteremia due to Gram-positive bacteria Active Problems:   Cellulitis and abscess of right leg with Bacteremia   CAD (coronary artery disease)   Morbid obesity due to excess calories (HCC)   Hypertension   Elevated troponin   Dyslipidemia   Acute febrile illness   GERD (gastroesophageal reflux disease)   COPD (chronic obstructive pulmonary disease) (HCC)   Bacteremia  Brief Summary:- 59 y.o. male with medical history significant for HTN, HLD, CAD, GERD, COPD and Morbid Obesity admitted on 10/20/2019 with SIRs-and found to have Colin Lee bacteremia most likely secondary to right leg cellulitis   A/p  1)GPC Bacteremia--- in the setting of right lower extremity cellulitis, blood cultures with GPC, --I ordered IV Zosyn on vancomycin per cellulitis order sets---pending further culture data -Patient with high fevers, tachycardia, tachypnea and right lower extremity cellulitis, no leukocytosis----patient meets SIRS criteria, patient has no hypoxia, no AKI, no metabolic encephalopathy, no lactic acidosis and no other evidence of endorgan dysfunction--patient does NOT meet criteria for sepsis at this time -CRP 17.4 , ESR 34,  -Procalcitonin is less than 0.10 -Patient may need echocardiogram -  2) right lower extremity cellulitis--patient just returned from a trip to the beach---please  see pictures in epic -Get lower extremity Dopplers -Antibiotics as above #1  3) COPD/emphysema--no acute flareup bronchodilators as ordered -CTA chest without acute findings  4)CAD--history of prior angioplasty and stent placement--no ACS type symptoms at this time, initial troponin is 24, repeat troponin is 24 -Continue aspirin, Plavix, metoprolol and Lipitor  5)RA--- longstanding history of rheumatoid arthritis, PTA patient was on chloroquine and prednisone -Continue prednisone  6)Morbid Obesity- -Low calorie diet, portion control and increase physical activity discussed with patient -Body mass index is 42.72 kg/m.  Disposition/Need for in-Hospital Stay- patient unable to be discharged at this time due to --- GPC bacteremia and right lower extremity cellulitis with persistent fevers requiring IV antibiotics and IV fluids pending further culture data  Status is: Inpatient  Remains inpatient appropriate because:GPC bacteremia and right lower extremity cellulitis with persistent fevers requiring IV antibiotics and IV fluids pending further culture data   Disposition: The patient is from: Home              Anticipated d/c is to: Home              Anticipated d/c date is: 2 days              Patient currently is not medically stable to d/c. Barriers: Not Clinically Stable- -GPC bacteremia and right lower extremity cellulitis with persistent fevers requiring IV antibiotics and IV fluids pending further culture data  Code Status : full  Family Communication:    (patient is alert, awake and coherent  Consults  :  na  DVT Prophylaxis  :  Lovenox -  -    Lab Results  Component Value Date   PLT 167 10/21/2019    Inpatient Medications  Scheduled Meds: . aspirin EC  81 mg Oral Daily  . atorvastatin  20 mg Oral Daily  . clopidogrel  75 mg Oral Daily  . enoxaparin (LOVENOX) injection  60 mg Subcutaneous QHS  . metoprolol tartrate  12.5 mg Oral BID  . pantoprazole  40 mg Oral  Daily  . predniSONE  10 mg Oral Daily   Continuous Infusions: . piperacillin-tazobactam (ZOSYN)  IV    . vancomycin 1,250 mg (10/21/19 1055)   PRN Meds:.acetaminophen, nitroGLYCERIN    Anti-infectives (From admission, onward)   Start     Dose/Rate Route Frequency Ordered Stop   10/21/19 2200  ceFEPIme (MAXIPIME) 2 g in sodium chloride 0.9 % 100 mL IVPB  Status:  Discontinued        2 g 200 mL/hr over 30 Minutes Intravenous Every 8 hours 10/21/19 1710 10/21/19 1716   10/21/19 2200  piperacillin-tazobactam (ZOSYN) IVPB 3.375 g     Discontinue     3.375 g 12.5 mL/hr over 240 Minutes Intravenous Every 8 hours 10/21/19 1719     10/21/19 1715  piperacillin-tazobactam (ZOSYN) IVPB 3.375 g  Status:  Discontinued        3.375 g 100 mL/hr over 30 Minutes Intravenous  Once 10/21/19 1705 10/21/19 1710   10/21/19 1715  vancomycin (VANCOCIN) IVPB 1000 mg/200 mL premix  Status:  Discontinued        1,000 mg 200 mL/hr over 60 Minutes Intravenous  Once 10/21/19 1705 10/21/19 1711   10/21/19 0600  ceFEPIme (MAXIPIME) 2 g in sodium chloride 0.9 % 100 mL IVPB  Status:  Discontinued        2 g 200 mL/hr over 30 Minutes Intravenous Every 8 hours 10/20/19 2104 10/21/19 1705   10/20/19 2115  vancomycin (VANCOREADY) IVPB 1250 mg/250 mL     Discontinue     1,250 mg 166.7 mL/hr over 90 Minutes Intravenous Every 12 hours 10/20/19 2101     10/20/19 2100  ceFEPIme (MAXIPIME) 2 g in sodium chloride 0.9 % 100 mL IVPB        2 g 200 mL/hr over 30 Minutes Intravenous  Once 10/20/19 2047 10/20/19 2148   10/20/19 2100  metroNIDAZOLE (FLAGYL) IVPB 500 mg        500 mg 100 mL/hr over 60 Minutes Intravenous  Once 10/20/19 2047 10/20/19 2306   10/20/19 2100  vancomycin (VANCOCIN) IVPB 1000 mg/200 mL premix  Status:  Discontinued        1,000 mg 200 mL/hr over 60 Minutes Intravenous  Once 10/20/19 2047 10/20/19 2101        Objective:   Vitals:   10/21/19 0500 10/21/19 1034 10/21/19 1455 10/21/19 1542  BP:  111/60 132/62  120/69  Pulse: 87 95  89  Resp: '20 20  20  ' Temp: 100.1 F (37.8 C) (!) 101.7 F (38.7 C) (!) 101.2 F (38.4 C) (!) 100.9 F (38.3 C)  TempSrc:  Oral  Oral  SpO2: 97%   98%  Weight:      Height:        Wt Readings from Last 3 Encounters:  10/21/19 135 kg  03/29/19 133.4 kg  03/02/19 133.4 kg     Intake/Output  Summary (Last 24 hours) at 10/21/2019 1727 Last data filed at 10/21/2019 0400 Gross per 24 hour  Intake 250.68 ml  Output --  Net 250.68 ml   Physical Exam Gen:- Awake Alert, morbidly obese, HEENT:- [place].AT, No sclera icterus Neck-Supple Neck,No JVD,.  Lungs-diminished breath sounds, no wheezing CV- S1, S2 normal, regular  Abd-  +ve B.Sounds, Abd Soft, No tenderness, increased truncal adiposity Extremity - No  edema, pedal pulses present  Psych-affect is appropriate, oriented x3 Neuro-no new focal deficits, no tremors Skin--- medial aspect of right leg with erythema, tenderness, warmth, streaking from the medial malleolus all the way to the knee area   Data Review:   Micro Results Recent Results (from the past 240 hour(s))  SARS Coronavirus 2 by RT PCR (hospital order, performed in [facility] hospital lab) Nasopharyngeal Nasopharyngeal Swab     Status: None   Collection Time: 10/20/19  8:21 PM   Specimen: Nasopharyngeal Swab  Result Value Ref Range Status   SARS Coronavirus 2 NEGATIVE NEGATIVE Final    Comment: (NOTE) SARS-CoV-2 target nucleic acids are NOT DETECTED.  The SARS-CoV-2 RNA is generally detectable in upper and lower respiratory specimens during the acute phase of infection. The lowest concentration of SARS-CoV-2 viral copies this assay can detect is 250 copies / mL. A negative result does not preclude SARS-CoV-2 infection and should not be used as the sole basis for treatment or other patient management decisions.  A negative result may occur with improper specimen collection / handling, submission of specimen other than  nasopharyngeal swab, presence of viral mutation(s) within the areas targeted by this assay, and inadequate number of viral copies (<250 copies / mL). A negative result must be combined with clinical observations, patient history, and epidemiological information.  Fact Sheet for Patients:   StrictlyIdeas.no  Fact Sheet for Healthcare Providers: BankingDealers.co.za  This test is not yet approved or  cleared by the [place] FDA and has been authorized for detection and/or diagnosis of SARS-CoV-2 by FDA under an Emergency Use Authorization (EUA).  This EUA will remain in effect (meaning this test can be used) for the duration of the COVID-19 declaration under Section 564(b)(1) of the Act, 21 U.S.C. section 360bbb-3(b)(1), unless the authorization is terminated or revoked sooner.  Performed at [facility], [address], [place], [place] [address]   Blood Culture (routine x 2)     Status: None (Preliminary result)   Collection Time: 10/20/19  8:21 PM   Specimen: Right Antecubital; Blood  Result Value Ref Range Status   Specimen Description RIGHT ANTECUBITAL  Final   Special Requests   Final    BOTTLES DRAWN AEROBIC AND ANAEROBIC Blood Culture adequate volume   Culture  Setup Time   Final    ANAEROBIC BOTTLE GRAM POSITIVE COCCI Gram Stain Report Called to,Read Back By and Verified With: DANIEL MARTIN 10/21/19 '@1610'  BY TJ    Culture   Final    NO GROWTH < 12 HOURS Performed at [facility], [address] [place], [place] [address]    Report Status PENDING  Incomplete  Blood Culture (routine x 2)     Status: None (Preliminary result)   Collection Time: 10/20/19  9:04 PM   Specimen: BLOOD RIGHT ARM  Result Value Ref Range Status   Specimen Description BLOOD RIGHT ARM  Final   Special Requests   Final    BOTTLES DRAWN AEROBIC AND ANAEROBIC Blood Culture adequate volume   Culture   Final  NO GROWTH < 12 HOURS Performed at  [facility], [address] [place], [place] [address]    Report Status PENDING  Incomplete    Radiology Reports CT Angio Chest PE W and/or Wo Contrast  Result Date: 10/20/2019 CLINICAL DATA:  Shortness of breath, weakness, fever EXAM: CT ANGIOGRAPHY CHEST WITH CONTRAST TECHNIQUE: Multidetector CT imaging of the chest was performed using the standard protocol during bolus administration of intravenous contrast. Multiplanar CT image reconstructions and MIPs were obtained to evaluate the vascular anatomy. CONTRAST:  166m OMNIPAQUE IOHEXOL 350 MG/ML SOLN COMPARISON:  06/02/2014, 10/20/2019 FINDINGS: Cardiovascular: This is a technically adequate evaluation of the pulmonary vasculature. No acute filling defects or pulmonary emboli. The heart and great vessels are unremarkable without pericardial effusion. Mild atherosclerosis of the aortic arch. Mediastinum/Nodes: No enlarged mediastinal, hilar, or axillary lymph nodes. Thyroid gland, trachea, and esophagus demonstrate no significant findings. Lungs/Pleura: Chronic areas of scarring and fibrosis are seen throughout the lungs, greatest in the left lower lobe. No acute airspace disease, effusion, or pneumothorax. Bullous emphysematous changes are seen at the lung apices. Upper Abdomen: Calcified gallstones are identified without cholecystitis. No other acute upper abdominal findings. Musculoskeletal: No acute or destructive bony lesions. Reconstructed images demonstrate no additional findings. Review of the MIP images confirms the above findings. IMPRESSION: 1. No evidence of pulmonary embolus. 2. Emphysema, with basilar predominant areas of scarring and fibrosis. 3. Cholelithiasis without evidence of cholecystitis. 4. Aortic Atherosclerosis (ICD10-I70.0) and Emphysema (ICD10-J43.9). Electronically Signed   By: MRanda NgoM.D.   On: 10/20/2019 22:34   DG Chest Port 1 View  Result Date: 10/20/2019 CLINICAL DATA:  59year old male with possible sepsis.  Testing for COVID-19 pending. EXAM: PORTABLE CHEST 1 VIEW COMPARISON:  Chest radiographs 03/29/2019 and earlier. FINDINGS: Portable AP upright view at 2057 hours. Lung volumes and mediastinal contours are stable. Visualized tracheal air column is within normal limits. Mild chronic interstitial markings appear stable. Otherwise allowing for portable technique the lungs are clear. No acute osseous abnormality identified. IMPRESSION: No acute cardiopulmonary abnormality. Electronically Signed   By: HGenevie AnnM.D.   On: 10/20/2019 21:12     CBC Recent Labs  Lab 10/20/19 2021 10/21/19 0421  WBC 7.4 6.1  HGB 13.3 12.9*  HCT 41.1 40.0  PLT 188 167  MCV 88.4 88.1  MCH 28.6 28.4  MCHC 32.4 32.3  RDW 14.1 14.3  LYMPHSABS 0.7  --   MONOABS 0.4  --   EOSABS 0.1  --   BASOSABS 0.0  --     Chemistries  Recent Labs  Lab 10/20/19 2021 10/21/19 0421  NA 139 136  K 3.8 3.9  CL 101 101  CO2 27 25  GLUCOSE 91 83  BUN 22* 18  CREATININE 1.07 0.97  CALCIUM 8.9 8.7*  MG  --  1.7  AST 26 23  ALT 36 32  ALKPHOS 48 43  BILITOT 0.8 0.7   ------------------------------------------------------------------------------------------------------------------ No results for input(s): CHOL, HDL, LDLCALC, TRIG, CHOLHDL, LDLDIRECT in the last 72 hours.  Lab Results  Component Value Date   HGBA1C 6.1 (H) 10/06/2013   ------------------------------------------------------------------------------------------------------------------ No results for input(s): TSH, T4TOTAL, T3FREE, THYROIDAB in the last 72 hours.  Invalid input(s): FREET3 ------------------------------------------------------------------------------------------------------------------ No results for input(s): VITAMINB12, FOLATE, FERRITIN, TIBC, IRON, RETICCTPCT in the last 72 hours.  Coagulation profile Recent Labs  Lab 10/20/19 2021 10/21/19 0421  INR 1.0 1.1    No results for input(s): DDIMER in the last 72 hours.  Cardiac  Enzymes  No results for input(s): CKMB, TROPONINI, MYOGLOBIN in the last 168 hours.  Invalid input(s): CK ------------------------------------------------------------------------------------------------------------------    Component Value Date/Time   BNP 76.0 10/20/2019 2021     Roxan Hockey M.D on 10/21/2019 at 5:27 PM  Go to [email or web address] - for contact info  [facility] - Office  [phone]

## 2019-10-21 NOTE — Progress Notes (Signed)
CRITICAL VALUE ALERT  Critical Value:  Anaerobic bottle gram positive cocci  Date & Time Notied:  10/21/2019 1624  Provider Notified: Dr. Roxan Hockey  Orders Received/Actions taken: Dr. Joesph Fillers gave no further orders, patient already on IV antibiotics.

## 2019-10-22 ENCOUNTER — Inpatient Hospital Stay (HOSPITAL_COMMUNITY): Payer: BC Managed Care – PPO

## 2019-10-22 LAB — URINE CULTURE: Culture: NO GROWTH

## 2019-10-22 LAB — PROCALCITONIN: Procalcitonin: <0.1 ng/mL

## 2019-10-22 MED ORDER — ACETAMINOPHEN 325 MG PO TABS
650.0000 mg | ORAL_TABLET | ORAL | Status: AC
  Administered 2019-10-22: 650 mg via ORAL
  Filled 2019-10-22: qty 2

## 2019-10-22 MED ORDER — ACETAMINOPHEN 325 MG PO TABS: 650.0000 mg | ORAL_TABLET | ORAL | Status: DC

## 2019-10-22 NOTE — Progress Notes (Signed)
Patient Demographics:    Colin Lee, is a 59 y.o. male, DOB - 09-Jun-1960, OHY:[id number]  Admit date - 10/20/2019   Admitting Physician Jaelyne Deeg Denton Brick, MD  Outpatient Primary MD for the patient is [name], MD  LOS - 1   Chief Complaint  Patient presents with  . Fever        Subjective:    Colin Lee today has no emesis,  No chest pain,    -Fevers persist, right leg discomfort is not worse  Assessment  & Plan :    Principal Problem:   Bacteremia due to Gram-positive bacteria Active Problems:   Cellulitis and abscess of right leg with Bacteremia   CAD (coronary artery disease)   Morbid obesity due to excess calories (HCC)   Hypertension   Elevated troponin   Dyslipidemia   Acute febrile illness   GERD (gastroesophageal reflux disease)   COPD (chronic obstructive pulmonary disease) (HCC)   Bacteremia  Brief Summary:- 59 y.o. male with medical history significant for HTN, HLD, CAD, GERD, COPD and Morbid Obesity admitted on 10/20/2019 with SIRs-and found to have [place] bacteremia most likely secondary to right leg cellulitis   A/p  1)GPC Bacteremia--- blood cultures from 10/20/2019 with staph hominis/CONS--suspect contaminant,  -Repeat blood cultures ordered on 10/22/19 -Patient with high fevers, tachycardia, tachypnea and right lower extremity cellulitis, no leukocytosis----patient meets SIRS criteria, patient has no hypoxia, no AKI, no metabolic encephalopathy, no lactic acidosis and no other evidence of endorgan dysfunction--patient does NOT meet criteria for sepsis at this time -CRP 17.4 , ESR 34,  -Procalcitonin is less than 0.10 -Patient may need echocardiogram -  2) right lower extremity cellulitis--patient just returned from a trip to the beach---please see pictures in epic -  lower extremity Dopplers without acute DVT -IV Vanco and Zosyn per cellulitis order set  protocol  3) COPD/emphysema--no acute flareup bronchodilators as ordered -CTA chest without acute findings  4)CAD--history of prior angioplasty and stent placement--no ACS type symptoms at this time, initial troponin is 24, repeat troponin is 24 -Continue aspirin, Plavix, metoprolol and Lipitor  5)RA--- longstanding history of rheumatoid arthritis, PTA patient was on chloroquine and prednisone -Continue prednisone  6)Morbid Obesity- -Low calorie diet, portion control and increase physical activity discussed with patient -Body mass index is 42.72 kg/m.  Disposition/Need for in-Hospital Stay- patient unable to be discharged at this time due to --- cellulitis with persistent fevers and possible bacteremia requiring IV antibiotics pending final culture data   status is: Inpatient  Remains inpatient appropriate because:cellulitis with persistent fevers and possible bacteremia requiring IV antibiotics pending final culture data    Disposition: The patient is from: Home              Anticipated d/c is to: Home              Anticipated d/c date is: 2 days              Patient currently is not medically stable to d/c. Barriers: Not Clinically Stable- -cellulitis with persistent fevers and possible bacteremia requiring IV antibiotics pending final culture data   Code Status : full  Family Communication:    (patient is alert, awake and coherent  Consults  :  na  DVT Prophylaxis  :  Lovenox -  -    Lab Results  Component Value Date   PLT 167 10/21/2019    Inpatient Medications  Scheduled Meds: . aspirin EC  81 mg Oral Daily  . atorvastatin  20 mg Oral Daily  . clopidogrel  75 mg Oral Daily  . enoxaparin (LOVENOX) injection  60 mg Subcutaneous QHS  . metoprolol tartrate  12.5 mg Oral BID  . pantoprazole  40 mg Oral Daily  . predniSONE  20 mg Oral Daily  . umeclidinium bromide  1 puff Inhalation Daily   Continuous Infusions: . piperacillin-tazobactam (ZOSYN)  IV 3.375 g  (10/22/19 1440)  . vancomycin 1,250 mg (10/22/19 1005)   PRN Meds:.acetaminophen, albuterol, nitroGLYCERIN    Anti-infectives (From admission, onward)   Start     Dose/Rate Route Frequency Ordered Stop   10/21/19 2200  ceFEPIme (MAXIPIME) 2 g in sodium chloride 0.9 % 100 mL IVPB  Status:  Discontinued        2 g 200 mL/hr over 30 Minutes Intravenous Every 8 hours 10/21/19 1710 10/21/19 1716   10/21/19 2200  piperacillin-tazobactam (ZOSYN) IVPB 3.375 g     Discontinue     3.375 g 12.5 mL/hr over 240 Minutes Intravenous Every 8 hours 10/21/19 1719     10/21/19 1715  piperacillin-tazobactam (ZOSYN) IVPB 3.375 g  Status:  Discontinued        3.375 g 100 mL/hr over 30 Minutes Intravenous  Once 10/21/19 1705 10/21/19 1710   10/21/19 1715  vancomycin (VANCOCIN) IVPB 1000 mg/200 mL premix  Status:  Discontinued        1,000 mg 200 mL/hr over 60 Minutes Intravenous  Once 10/21/19 1705 10/21/19 1711   10/21/19 0600  ceFEPIme (MAXIPIME) 2 g in sodium chloride 0.9 % 100 mL IVPB  Status:  Discontinued        2 g 200 mL/hr over 30 Minutes Intravenous Every 8 hours 10/20/19 2104 10/21/19 1705   10/20/19 2115  vancomycin (VANCOREADY) IVPB 1250 mg/250 mL     Discontinue     1,250 mg 166.7 mL/hr over 90 Minutes Intravenous Every 12 hours 10/20/19 2101     10/20/19 2100  ceFEPIme (MAXIPIME) 2 g in sodium chloride 0.9 % 100 mL IVPB        2 g 200 mL/hr over 30 Minutes Intravenous  Once 10/20/19 2047 10/20/19 2148   10/20/19 2100  metroNIDAZOLE (FLAGYL) IVPB 500 mg        500 mg 100 mL/hr over 60 Minutes Intravenous  Once 10/20/19 2047 10/20/19 2306   10/20/19 2100  vancomycin (VANCOCIN) IVPB 1000 mg/200 mL premix  Status:  Discontinued        1,000 mg 200 mL/hr over 60 Minutes Intravenous  Once 10/20/19 2047 10/20/19 2101        Objective:   Vitals:   10/22/19 0840 10/22/19 1008 10/22/19 1440 10/22/19 1923  BP:  139/85 107/67   Pulse: (!) 110 (!) 110 88   Resp: '20 20 18   ' Temp: (!) 102.6 F  (39.2 C) 98.5 F (36.9 C) 98.7 F (37.1 C)   TempSrc: Oral Oral Oral   SpO2: 97%  95% 96%  Weight:      Height:        Wt Readings from Last 3 Encounters:  10/21/19 135 kg  03/29/19 133.4 kg  03/02/19 133.4 kg     Intake/Output Summary (Last 24 hours) at 10/22/2019 1957 Last data filed at 10/22/2019 1700 Gross  per 24 hour  Intake 1170 ml  Output --  Net 1170 ml   Physical Exam Gen:- Awake Alert, morbidly obese, HEENT:- [place].AT, No sclera icterus Neck-Supple Neck,No JVD,.  Lungs-diminished breath sounds, no wheezing CV- S1, S2 normal, regular  Abd-  +ve B.Sounds, Abd Soft, No tenderness, increased truncal adiposity Extremity - No  edema, pedal pulses present  Psych-affect is appropriate, oriented x3 Neuro-no new focal deficits, no tremors Skin--- medial aspect of right leg with erythema, tenderness, warmth, streaking from the medial malleolus all the way to the knee area   Data Review:   Micro Results Recent Results (from the past 240 hour(s))  SARS Coronavirus 2 by RT PCR (hospital order, performed in [facility] hospital lab) Nasopharyngeal Nasopharyngeal Swab     Status: None   Collection Time: 10/20/19  8:21 PM   Specimen: Nasopharyngeal Swab  Result Value Ref Range Status   SARS Coronavirus 2 NEGATIVE NEGATIVE Final    Comment: (NOTE) SARS-CoV-2 target nucleic acids are NOT DETECTED.  The SARS-CoV-2 RNA is generally detectable in upper and lower respiratory specimens during the acute phase of infection. The lowest concentration of SARS-CoV-2 viral copies this assay can detect is 250 copies / mL. A negative result does not preclude SARS-CoV-2 infection and should not be used as the sole basis for treatment or other patient management decisions.  A negative result may occur with improper specimen collection / handling, submission of specimen other than nasopharyngeal swab, presence of viral mutation(s) within the areas targeted by this assay, and inadequate  number of viral copies (<250 copies / mL). A negative result must be combined with clinical observations, patient history, and epidemiological information.  Fact Sheet for Patients:   StrictlyIdeas.no  Fact Sheet for Healthcare Providers: BankingDealers.co.za  This test is not yet approved or  cleared by the [place] FDA and has been authorized for detection and/or diagnosis of SARS-CoV-2 by FDA under an Emergency Use Authorization (EUA).  This EUA will remain in effect (meaning this test can be used) for the duration of the COVID-19 declaration under Section 564(b)(1) of the Act, 21 U.S.C. section 360bbb-3(b)(1), unless the authorization is terminated or revoked sooner.  Performed at [facility], [address] [place], [place] [address]   Blood Culture (routine x 2)     Status: Abnormal (Preliminary result)   Collection Time: 10/20/19  8:21 PM   Specimen: BLOOD  Result Value Ref Range Status   Specimen Description   Final    BLOOD RIGHT ANTECUBITAL Performed at [place] [address] [place], [place] [address]    Special Requests   Final    BOTTLES DRAWN AEROBIC AND ANAEROBIC Blood Culture adequate volume Performed at [facility], [address], [place], [place] [address]    Culture  Setup Time   Final    IN BOTH AEROBIC AND ANAEROBIC BOTTLES GRAM POSITIVE COCCI Gram Stain Report Called to,Read Back By and Verified With: DANIEL MARTIN 10/21/19 '@[id number]'  BY TJ Organism ID to follow CRITICAL RESULT CALLED TO, READ BACK BY AND VERIFIED WITH: Verlene Mayer RN '@[id number]'  10/21/19 EB    Culture (A)  Final    STAPHYLOCOCCUS HOMINIS THE SIGNIFICANCE OF ISOLATING THIS ORGANISM FROM A SINGLE SET OF BLOOD CULTURES WHEN MULTIPLE SETS ARE DRAWN IS UNCERTAIN. PLEASE NOTIFY THE MICROBIOLOGY DEPARTMENT WITHIN ONE WEEK IF SPECIATION AND SENSITIVITIES ARE REQUIRED. Performed at [facility], [place] [address] [place], [place] [address]     Report Status PENDING  Incomplete  Blood Culture ID Panel (Reflexed)     Status: Abnormal   Collection Time: 10/20/19  8:21 PM  Result Value Ref Range Status   Enterococcus species NOT DETECTED NOT DETECTED Final   Listeria monocytogenes NOT DETECTED NOT DETECTED Final   Staphylococcus species DETECTED (A) NOT DETECTED Final    Comment: Methicillin (oxacillin) susceptible coagulase negative staphylococcus. Possible blood culture contaminant (unless isolated from more than one blood culture draw or clinical case suggests pathogenicity). No antibiotic treatment is indicated for blood  culture contaminants. CRITICAL RESULT CALLED TO, READ BACK BY AND VERIFIED WITH: K,THOMAS RN '@[id number]'  10/21/19 EB    Staphylococcus aureus (BCID) NOT DETECTED NOT DETECTED Final   Methicillin resistance NOT DETECTED NOT DETECTED Final   Streptococcus species NOT DETECTED NOT DETECTED Final   Streptococcus agalactiae NOT DETECTED NOT DETECTED Final   Streptococcus pneumoniae NOT DETECTED NOT DETECTED Final   Streptococcus pyogenes NOT DETECTED NOT DETECTED Final   Acinetobacter baumannii NOT DETECTED NOT DETECTED Final   Enterobacteriaceae species NOT DETECTED NOT DETECTED Final   Enterobacter cloacae complex NOT DETECTED NOT DETECTED Final   Escherichia coli NOT DETECTED NOT DETECTED Final   Klebsiella oxytoca NOT DETECTED NOT DETECTED Final   Klebsiella pneumoniae NOT DETECTED NOT DETECTED Final   Proteus species NOT DETECTED NOT DETECTED Final   Serratia marcescens NOT DETECTED NOT DETECTED Final   Haemophilus influenzae NOT DETECTED NOT DETECTED Final   Neisseria meningitidis NOT DETECTED NOT DETECTED Final   Pseudomonas aeruginosa NOT DETECTED NOT DETECTED Final   Candida albicans NOT DETECTED NOT DETECTED Final   Candida glabrata NOT DETECTED NOT DETECTED Final   Candida krusei NOT DETECTED NOT DETECTED Final   Candida parapsilosis NOT DETECTED NOT DETECTED Final   Candida tropicalis NOT DETECTED NOT  DETECTED Final    Comment: Performed at [facility], [place]. [address] [place], [place] [address]  Urine culture     Status: None   Collection Time: 10/20/19  8:46 PM   Specimen: In/Out Cath Urine  Result Value Ref Range Status   Specimen Description   Final    IN/OUT CATH URINE Performed at [facility], [address] [place], [place] [address]    Special Requests   Final    NONE Performed at [facility], [address], [place], [place] [address]    Culture   Final    NO GROWTH Performed at [facility], [place] [address], [place], [place] [address]    Report Status 10/22/2019 FINAL  Final  Blood Culture (routine x 2)     Status: None (Preliminary result)   Collection Time: 10/20/19  9:04 PM   Specimen: BLOOD RIGHT ARM  Result Value Ref Range Status   Specimen Description BLOOD RIGHT ARM  Final   Special Requests   Final    BOTTLES DRAWN AEROBIC AND ANAEROBIC Blood Culture adequate volume   Culture   Final    NO GROWTH 2 DAYS Performed at [facility], [address], [place], [place] [address]    Report Status PENDING  Incomplete  Culture, blood (Routine X 2) w Reflex to ID Panel     Status: None (Preliminary result)   Collection Time: 10/22/19  8:53 AM   Specimen: BLOOD LEFT HAND  Result Value Ref Range Status   Specimen Description BLOOD LEFT HAND  Final   Special Requests   Final    BOTTLES DRAWN AEROBIC AND ANAEROBIC Blood Culture adequate volume Performed at [facility], [address], [place], [place] [address]    Culture PENDING  Incomplete   Report Status PENDING  Incomplete  Culture, blood (Routine X 2) w Reflex to ID Panel     Status: None (Preliminary result)   Collection Time: 10/22/19  8:53 AM   Specimen: BLOOD RIGHT HAND  Result Value Ref Range Status   Specimen Description BLOOD RIGHT HAND  Final   Special Requests   Final    BOTTLES DRAWN AEROBIC AND ANAEROBIC Blood Culture adequate volume Performed at [facility], [address],  [place], [place] [address]    Culture PENDING  Incomplete   Report Status PENDING  Incomplete    Radiology Reports CT Angio Chest PE W and/or Wo Contrast  Result Date: 10/20/2019 CLINICAL DATA:  Shortness of breath, weakness, fever EXAM: CT ANGIOGRAPHY CHEST WITH CONTRAST TECHNIQUE: Multidetector CT imaging of the chest was performed using the standard protocol during bolus administration of intravenous contrast. Multiplanar CT image reconstructions and MIPs were obtained to evaluate the vascular anatomy. CONTRAST:  163m OMNIPAQUE IOHEXOL 350 MG/ML SOLN COMPARISON:  06/02/2014, 10/20/2019 FINDINGS: Cardiovascular: This is a technically adequate evaluation of the pulmonary vasculature. No acute filling defects or pulmonary emboli. The heart and great vessels are unremarkable without pericardial effusion. Mild atherosclerosis of the aortic arch. Mediastinum/Nodes: No enlarged mediastinal, hilar, or axillary lymph nodes. Thyroid gland, trachea, and esophagus demonstrate no significant findings. Lungs/Pleura: Chronic areas of scarring and fibrosis are seen throughout the lungs, greatest in the left lower lobe. No acute airspace disease, effusion, or pneumothorax. Bullous emphysematous changes are seen at the lung apices. Upper Abdomen: Calcified gallstones are identified without cholecystitis. No other acute upper abdominal findings. Musculoskeletal: No acute or destructive bony lesions. Reconstructed images demonstrate no additional findings. Review of the MIP images confirms the above findings. IMPRESSION: 1. No evidence of pulmonary embolus. 2. Emphysema, with basilar predominant areas of scarring and fibrosis. 3. Cholelithiasis without evidence of cholecystitis. 4. Aortic Atherosclerosis (ICD10-I70.0) and Emphysema (ICD10-J43.9). Electronically Signed   By: MRanda NgoM.D.   On: 10/20/2019 22:34   UKoreaVenous Img Lower Bilateral (DVT)  Result Date: 10/22/2019 CLINICAL DATA:  Bilateral lower extremity pain  and edema. History of pulmonary embolism. Evaluate for DVT. EXAM: BILATERAL LOWER EXTREMITY VENOUS DOPPLER ULTRASOUND TECHNIQUE: Gray-scale sonography with graded compression, as well as color Doppler and duplex ultrasound were performed to evaluate the lower extremity deep venous systems from the level of the common femoral vein and including the common femoral, femoral, profunda femoral, popliteal and calf veins including the posterior tibial, peroneal and gastrocnemius veins when visible. The superficial great saphenous vein was also interrogated. Spectral Doppler was utilized to evaluate flow at rest and with distal augmentation maneuvers in the common femoral, femoral and popliteal veins. COMPARISON:  None. FINDINGS: RIGHT LOWER EXTREMITY Common Femoral Vein: No evidence of thrombus. Normal compressibility, respiratory phasicity and response to augmentation. Saphenofemoral Junction: No evidence of thrombus. Normal compressibility and flow on color Doppler imaging. Profunda Femoral Vein: No evidence of thrombus. Normal compressibility and flow on color Doppler imaging. Femoral Vein: No evidence of thrombus. Normal compressibility, respiratory phasicity and response to augmentation. Popliteal Vein: No evidence of thrombus. Normal compressibility, respiratory phasicity and response to augmentation. Calf Veins: No evidence of thrombus. Normal compressibility and flow on color Doppler imaging. Superficial Great Saphenous Vein: No evidence of thrombus. Normal compressibility. Venous Reflux:  None. Other Findings:  None. LEFT LOWER EXTREMITY Common Femoral Vein: No evidence of thrombus. Normal compressibility,  respiratory phasicity and response to augmentation. Saphenofemoral Junction: No evidence of thrombus. Normal compressibility and flow on color Doppler imaging. Profunda Femoral Vein: No evidence of thrombus. Normal compressibility and flow on color Doppler imaging. Femoral Vein: No evidence of thrombus. Normal  compressibility, respiratory phasicity and response to augmentation. Popliteal Vein: No evidence of thrombus. Normal compressibility, respiratory phasicity and response to augmentation. Calf Veins: No evidence of thrombus. Normal compressibility and flow on color Doppler imaging. Superficial Great Saphenous Vein: No evidence of thrombus. Normal compressibility. Venous Reflux:  None. Other Findings:  None. IMPRESSION: No evidence of DVT within either lower extremity. Electronically Signed   By: Sandi Mariscal M.D.   On: 10/22/2019 14:19   DG Chest Port 1 View  Result Date: 10/20/2019 CLINICAL DATA:  59 year old male with possible sepsis. Testing for COVID-19 pending. EXAM: PORTABLE CHEST 1 VIEW COMPARISON:  Chest radiographs 03/29/2019 and earlier. FINDINGS: Portable AP upright view at 2057 hours. Lung volumes and mediastinal contours are stable. Visualized tracheal air column is within normal limits. Mild chronic interstitial markings appear stable. Otherwise allowing for portable technique the lungs are clear. No acute osseous abnormality identified. IMPRESSION: No acute cardiopulmonary abnormality. Electronically Signed   By: Genevie Ann M.D.   On: 10/20/2019 21:12     CBC Recent Labs  Lab 10/20/19 2021 10/21/19 0421  WBC 7.4 6.1  HGB 13.3 12.9*  HCT 41.1 40.0  PLT 188 167  MCV 88.4 88.1  MCH 28.6 28.4  MCHC 32.4 32.3  RDW 14.1 14.3  LYMPHSABS 0.7  --   MONOABS 0.4  --   EOSABS 0.1  --   BASOSABS 0.0  --     Chemistries  Recent Labs  Lab 10/20/19 2021 10/21/19 0421  NA 139 136  K 3.8 3.9  CL 101 101  CO2 27 25  GLUCOSE 91 83  BUN 22* 18  CREATININE 1.07 0.97  CALCIUM 8.9 8.7*  MG  --  1.7  AST 26 23  ALT 36 32  ALKPHOS 48 43  BILITOT 0.8 0.7   ------------------------------------------------------------------------------------------------------------------ No results for input(s): CHOL, HDL, LDLCALC, TRIG, CHOLHDL, LDLDIRECT in the last 72 hours.  Lab Results  Component  Value Date   HGBA1C 6.1 (H) 10/06/2013   ------------------------------------------------------------------------------------------------------------------ No results for input(s): TSH, T4TOTAL, T3FREE, THYROIDAB in the last 72 hours.  Invalid input(s): FREET3 ------------------------------------------------------------------------------------------------------------------ No results for input(s): VITAMINB12, FOLATE, FERRITIN, TIBC, IRON, RETICCTPCT in the last 72 hours.  Coagulation profile Recent Labs  Lab 10/20/19 2021 10/21/19 0421  INR 1.0 1.1    No results for input(s): DDIMER in the last 72 hours.  Cardiac Enzymes No results for input(s): CKMB, TROPONINI, MYOGLOBIN in the last 168 hours.  Invalid input(s): CK ------------------------------------------------------------------------------------------------------------------    Component Value Date/Time   BNP 76.0 10/20/2019 2021     Roxan Hockey M.D on 10/22/2019 at 7:57 PM  Go to [email or web address] - for contact info  [facility] - Office  [phone]

## 2019-10-23 LAB — TSH: TSH: 1.777 u[IU]/mL (ref 0.350–4.500)

## 2019-10-23 LAB — COMPREHENSIVE METABOLIC PANEL
ALT: 37 U/L (ref 0–44)
AST: 26 U/L (ref 15–41)
Albumin: 3.2 g/dL — ABNORMAL LOW (ref 3.5–5.0)
Alkaline Phosphatase: 43 U/L (ref 38–126)
Anion gap: 11 (ref 5–15)
BUN: 19 mg/dL (ref 6–20)
CO2: 23 mmol/L (ref 22–32)
Calcium: 8.4 mg/dL — ABNORMAL LOW (ref 8.9–10.3)
Chloride: 102 mmol/L (ref 98–111)
Creatinine, Ser: 1.05 mg/dL (ref 0.61–1.24)
GFR calc Af Amer: >60 mL/min (ref >60)
GFR calc non Af Amer: >60 mL/min (ref >60)
Glucose, Bld: 92 mg/dL (ref 70–99)
Potassium: 3.6 mmol/L (ref 3.5–5.1)
Sodium: 136 mmol/L (ref 135–145)
Total Bilirubin: 0.6 mg/dL (ref 0.3–1.2)
Total Protein: 6.7 g/dL (ref 6.5–8.1)

## 2019-10-23 LAB — LIPID PANEL
Cholesterol: 123 mg/dL (ref 0–200)
HDL: 30 mg/dL — ABNORMAL LOW (ref >40)
LDL Cholesterol: 74 mg/dL (ref 0–99)
Total CHOL/HDL Ratio: 4.1 RATIO
Triglycerides: 93 mg/dL (ref <150)
VLDL: 19 mg/dL (ref 0–40)

## 2019-10-23 LAB — CULTURE, BLOOD (ROUTINE X 2): Special Requests: ADEQUATE

## 2019-10-23 LAB — CBC
HCT: 36.5 % — ABNORMAL LOW (ref 39.0–52.0)
Hemoglobin: 12.1 g/dL — ABNORMAL LOW (ref 13.0–17.0)
MCH: 28.5 pg (ref 26.0–34.0)
MCHC: 33.2 g/dL (ref 30.0–36.0)
MCV: 85.9 fL (ref 80.0–100.0)
Platelets: 153 10*3/uL (ref 150–400)
RBC: 4.25 MIL/uL (ref 4.22–5.81)
RDW: 13.6 % (ref 11.5–15.5)
WBC: 5.7 10*3/uL (ref 4.0–10.5)
nRBC: 0 % (ref 0.0–0.2)

## 2019-10-23 LAB — PROCALCITONIN: Procalcitonin: 0.13 ng/mL

## 2019-10-23 MED ORDER — DIPHENHYDRAMINE HCL 25 MG PO CAPS
25.0000 mg | ORAL_CAPSULE | Freq: Once | ORAL | Status: AC
  Administered 2019-10-23: 25 mg via ORAL
  Filled 2019-10-23: qty 1

## 2019-10-23 NOTE — Progress Notes (Signed)
Patient Demographics:    Colin Lee, is a 59 y.o. male, DOB - [birth year], VOP:[id number]  Admit date - 10/20/2019   Admitting Physician Trenell Concannon Denton Brick, MD  Outpatient Primary MD for the patient is [name], MD  LOS - 2   Chief Complaint  Patient presents with  . Fever        Subjective:    [name] today has no emesis,  No chest pain,    -Wife at bedside, -T-max 103.2 Chills and sweats persist  Assessment  & Plan :    Principal Problem:   Bacteremia due to Gram-positive bacteria Active Problems:   Cellulitis and abscess of right leg with Bacteremia   CAD (coronary artery disease)   Morbid obesity due to excess calories (HCC)   Hypertension   Elevated troponin   Dyslipidemia   Acute febrile illness   GERD (gastroesophageal reflux disease)   COPD (chronic obstructive pulmonary disease) (HCC)   Bacteremia  Brief Summary:- 59 y.o. male with medical history significant for HTN, HLD, CAD, GERD, COPD and Morbid Obesity admitted on 10/20/2019 with SIRs-and found to have [place] bacteremia most likely secondary to right leg cellulitis -Continues to have fevers and chills and sweats   A/p  1) acute febrile illness --very high fevers with chills and sweats persist- -- blood cultures from 10/20/2019 with staph hominis/CONS--suspect contaminant,  -Repeat blood cultures from  10/22/19 NGTD -Patient with high fevers, tachycardia, tachypnea and right lower extremity cellulitis, no leukocytosis----patient meets SIRS criteria, patient has no hypoxia, no AKI, no metabolic encephalopathy, no lactic acidosis and no other evidence of endorgan dysfunction--patient does NOT meet criteria for sepsis at this time -CRP 17.4 , ESR 34,  -Procalcitonin < 0.10>> 0.13 -Patient may need echocardiogram 7/14 Ucx: NGF -Lyme titers and [place] spotted fever titers pending -We will repeat CRP and  ESR  2) right lower extremity cellulitis--patient just returned from a trip to the beach---please see pictures in epic -  lower extremity Dopplers without acute DVT -Continue IV Vanco and Zosyn per cellulitis order set protocol  3) COPD/emphysema--no acute flareup bronchodilators as ordered -CTA chest without acute findings  4)CAD--history of prior angioplasty and stent placement--no ACS type symptoms at this time, initial troponin is 24, repeat troponin is 24 -Continue aspirin, Plavix, metoprolol and Lipitor  5)RA--- longstanding history of rheumatoid arthritis, PTA patient was on chloroquine and prednisone -Continue prednisone  6)Morbid Obesity- -Low calorie diet, portion control and increase physical activity discussed with patient -Body mass index is 42.72 kg/m.  Disposition/Need for in-Hospital Stay- patient unable to be discharged at this time due to --- cellulitis with persistent fevers and possible bacteremia requiring IV antibiotics pending final culture data   status is: Inpatient  Remains inpatient appropriate because:cellulitis with persistent fevers and possible bacteremia requiring IV antibiotics pending final culture data    Disposition: The patient is from: Home              Anticipated d/c is to: Home              Anticipated d/c date is: 2 days              Patient currently is not medically stable to d/c. Barriers: Not Clinically Stable- -cellulitis  with persistent fevers and possible bacteremia requiring IV antibiotics pending final culture data   Code Status : full  Family Communication:    (patient is alert, awake and coherent Discussed with wife at bedside Consults  :  na  DVT Prophylaxis  :  Lovenox -  -    Lab Results  Component Value Date   PLT 153 10/23/2019    Inpatient Medications  Scheduled Meds: . aspirin EC  81 mg Oral Daily  . atorvastatin  20 mg Oral Daily  . clopidogrel  75 mg Oral Daily  . enoxaparin (LOVENOX) injection  60 mg  Subcutaneous QHS  . metoprolol tartrate  12.5 mg Oral BID  . pantoprazole  40 mg Oral Daily  . predniSONE  20 mg Oral Daily  . umeclidinium bromide  1 puff Inhalation Daily   Continuous Infusions: . piperacillin-tazobactam (ZOSYN)  IV 3.375 g (10/23/19 1419)  . vancomycin 1,250 mg (10/23/19 1113)   PRN Meds:.acetaminophen, albuterol, nitroGLYCERIN    Anti-infectives (From admission, onward)   Start     Dose/Rate Route Frequency Ordered Stop   10/21/19 2200  ceFEPIme (MAXIPIME) 2 g in sodium chloride 0.9 % 100 mL IVPB  Status:  Discontinued        2 g 200 mL/hr over 30 Minutes Intravenous Every 8 hours 10/21/19 1710 10/21/19 1716   10/21/19 2200  piperacillin-tazobactam (ZOSYN) IVPB 3.375 g     Discontinue     3.375 g 12.5 mL/hr over 240 Minutes Intravenous Every 8 hours 10/21/19 1719     10/21/19 1715  piperacillin-tazobactam (ZOSYN) IVPB 3.375 g  Status:  Discontinued        3.375 g 100 mL/hr over 30 Minutes Intravenous  Once 10/21/19 1705 10/21/19 1710   10/21/19 1715  vancomycin (VANCOCIN) IVPB 1000 mg/200 mL premix  Status:  Discontinued        1,000 mg 200 mL/hr over 60 Minutes Intravenous  Once 10/21/19 1705 10/21/19 1711   10/21/19 0600  ceFEPIme (MAXIPIME) 2 g in sodium chloride 0.9 % 100 mL IVPB  Status:  Discontinued        2 g 200 mL/hr over 30 Minutes Intravenous Every 8 hours 10/20/19 2104 10/21/19 1705   10/20/19 2115  vancomycin (VANCOREADY) IVPB 1250 mg/250 mL     Discontinue     1,250 mg 166.7 mL/hr over 90 Minutes Intravenous Every 12 hours 10/20/19 2101     10/20/19 2100  ceFEPIme (MAXIPIME) 2 g in sodium chloride 0.9 % 100 mL IVPB        2 g 200 mL/hr over 30 Minutes Intravenous  Once 10/20/19 2047 10/20/19 2148   10/20/19 2100  metroNIDAZOLE (FLAGYL) IVPB 500 mg        500 mg 100 mL/hr over 60 Minutes Intravenous  Once 10/20/19 2047 10/20/19 2306   10/20/19 2100  vancomycin (VANCOCIN) IVPB 1000 mg/200 mL premix  Status:  Discontinued        1,000 mg 200  mL/hr over 60 Minutes Intravenous  Once 10/20/19 2047 10/20/19 2101        Objective:   Vitals:   10/23/19 0551 10/23/19 1100 10/23/19 1323 10/23/19 1644  BP:  (!) 157/79 (!) 109/54 (!) 119/92  Pulse:  100 93   Resp:  18 18   Temp:  (!) 103.2 F (39.6 C) (!) 102.6 F (39.2 C) 99.7 F (37.6 C)  TempSrc:  Oral Oral Oral  SpO2: 98% 95% 97% 96%  Weight:      Height:  Wt Readings from Last 3 Encounters:  10/21/19 135 kg  03/29/19 133.4 kg  03/02/19 133.4 kg     Intake/Output Summary (Last 24 hours) at 10/23/2019 1900 Last data filed at 10/23/2019 1700 Gross per 24 hour  Intake 720 ml  Output --  Net 720 ml   Physical Exam Gen:- Awake Alert, morbidly obese, HEENT:- [place].AT, No sclera icterus Neck-Supple Neck,No JVD,.  Lungs-diminished breath sounds, no wheezing CV- S1, S2 normal, regular  Abd-  +ve B.Sounds, Abd Soft, No tenderness, increased truncal adiposity Extremity - No  edema, pedal pulses present  Psych-affect is appropriate, oriented x3 Neuro-no new focal deficits, no tremors Skin--- medial aspect of right leg with erythema, tenderness, warmth, streaking from the medial malleolus all the way to the knee area   Data Review:   Micro Results Recent Results (from the past 240 hour(s))  SARS Coronavirus 2 by RT PCR (hospital order, performed in [facility] hospital lab) Nasopharyngeal Nasopharyngeal Swab     Status: None   Collection Time: 10/20/19  8:21 PM   Specimen: Nasopharyngeal Swab  Result Value Ref Range Status   SARS Coronavirus 2 NEGATIVE NEGATIVE Final    Comment: (NOTE) SARS-CoV-2 target nucleic acids are NOT DETECTED.  The SARS-CoV-2 RNA is generally detectable in upper and lower respiratory specimens during the acute phase of infection. The lowest concentration of SARS-CoV-2 viral copies this assay can detect is 250 copies / mL. A negative result does not preclude SARS-CoV-2 infection and should not be used as the sole basis for treatment or  other patient management decisions.  A negative result may occur with improper specimen collection / handling, submission of specimen other than nasopharyngeal swab, presence of viral mutation(s) within the areas targeted by this assay, and inadequate number of viral copies (<250 copies / mL). A negative result must be combined with clinical observations, patient history, and epidemiological information.  Fact Sheet for Patients:   StrictlyIdeas.no  Fact Sheet for Healthcare Providers: BankingDealers.co.za  This test is not yet approved or  cleared by the [place] FDA and has been authorized for detection and/or diagnosis of SARS-CoV-2 by FDA under an Emergency Use Authorization (EUA).  This EUA will remain in effect (meaning this test can be used) for the duration of the COVID-19 declaration under Section 564(b)(1) of the Act, 21 U.S.C. section 360bbb-3(b)(1), unless the authorization is terminated or revoked sooner.  Performed at [facility], [address], [place], [place] [address]   Blood Culture (routine x 2)     Status: Abnormal   Collection Time: 10/20/19  8:21 PM   Specimen: BLOOD  Result Value Ref Range Status   Specimen Description   Final    BLOOD RIGHT ANTECUBITAL Performed at [place] [address], [place], [place] [address]    Special Requests   Final    BOTTLES DRAWN AEROBIC AND ANAEROBIC Blood Culture adequate volume Performed at [facility], [address], [place], [place] [address]    Culture  Setup Time   Final    IN BOTH AEROBIC AND ANAEROBIC BOTTLES GRAM POSITIVE COCCI Gram Stain Report Called to,Read Back By and Verified With: DANIEL MARTIN 10/21/19 _0  BY TJ Organism ID to follow CRITICAL RESULT CALLED TO, READ BACK BY AND VERIFIED WITH: Verlene Mayer RN _1  10/21/19 EB    Culture (A)  Final    STAPHYLOCOCCUS HOMINIS THE SIGNIFICANCE OF ISOLATING THIS ORGANISM FROM A SINGLE SET OF BLOOD  CULTURES WHEN MULTIPLE SETS ARE DRAWN  IS UNCERTAIN. PLEASE NOTIFY THE MICROBIOLOGY DEPARTMENT WITHIN ONE WEEK IF SPECIATION AND SENSITIVITIES ARE REQUIRED. Performed at [facility], [place] [address], [place], [place] [address]    Report Status 10/23/2019 FINAL  Final  Blood Culture ID Panel (Reflexed)     Status: Abnormal   Collection Time: 10/20/19  8:21 PM  Result Value Ref Range Status   Enterococcus species NOT DETECTED NOT DETECTED Final   Listeria monocytogenes NOT DETECTED NOT DETECTED Final   Staphylococcus species DETECTED (A) NOT DETECTED Final    Comment: Methicillin (oxacillin) susceptible coagulase negative staphylococcus. Possible blood culture contaminant (unless isolated from more than one blood culture draw or clinical case suggests pathogenicity). No antibiotic treatment is indicated for blood  culture contaminants. CRITICAL RESULT CALLED TO, READ BACK BY AND VERIFIED WITH: K,THOMAS RN _0  10/21/19 EB    Staphylococcus aureus (BCID) NOT DETECTED NOT DETECTED Final   Methicillin resistance NOT DETECTED NOT DETECTED Final   Streptococcus species NOT DETECTED NOT DETECTED Final   Streptococcus agalactiae NOT DETECTED NOT DETECTED Final   Streptococcus pneumoniae NOT DETECTED NOT DETECTED Final   Streptococcus pyogenes NOT DETECTED NOT DETECTED Final   Acinetobacter baumannii NOT DETECTED NOT DETECTED Final   Enterobacteriaceae species NOT DETECTED NOT DETECTED Final   Enterobacter cloacae complex NOT DETECTED NOT DETECTED Final   Escherichia coli NOT DETECTED NOT DETECTED Final   Klebsiella oxytoca NOT DETECTED NOT DETECTED Final   Klebsiella pneumoniae NOT DETECTED NOT DETECTED Final   Proteus species NOT DETECTED NOT DETECTED Final   Serratia marcescens NOT DETECTED NOT DETECTED Final   Haemophilus influenzae NOT DETECTED NOT DETECTED Final   Neisseria meningitidis NOT DETECTED NOT DETECTED Final   Pseudomonas aeruginosa NOT DETECTED NOT DETECTED Final   Candida  albicans NOT DETECTED NOT DETECTED Final   Candida glabrata NOT DETECTED NOT DETECTED Final   Candida krusei NOT DETECTED NOT DETECTED Final   Candida parapsilosis NOT DETECTED NOT DETECTED Final   Candida tropicalis NOT DETECTED NOT DETECTED Final    Comment: Performed at [facility], [address] [place], [place] [address]  Urine culture     Status: None   Collection Time: 10/20/19  8:46 PM   Specimen: In/Out Cath Urine  Result Value Ref Range Status   Specimen Description   Final    IN/OUT CATH URINE Performed at [facility], [address] [place], [place] [address]    Special Requests   Final    NONE Performed at [facility], [address] [place], [place] [address]    Culture   Final    NO GROWTH Performed at [facility], [place] [address], [place], [place] [address]    Report Status 10/22/2019 FINAL  Final  Blood Culture (routine x 2)     Status: None (Preliminary result)   Collection Time: 10/20/19  9:04 PM   Specimen: BLOOD RIGHT ARM  Result Value Ref Range Status   Specimen Description BLOOD RIGHT ARM  Final   Special Requests   Final    BOTTLES DRAWN AEROBIC AND ANAEROBIC Blood Culture adequate volume   Culture   Final    NO GROWTH 3 DAYS Performed at [facility], [address], [place], [place] [address]    Report Status PENDING  Incomplete  Culture, blood (Routine X 2) w Reflex to ID Panel     Status: None (Preliminary result)   Collection Time: 10/22/19  8:53 AM   Specimen: BLOOD LEFT HAND  Result Value Ref Range Status   Specimen Description BLOOD LEFT HAND  Final   Special Requests   Final    BOTTLES DRAWN AEROBIC AND ANAEROBIC Blood Culture adequate volume   Culture   Final    NO GROWTH < 24 HOURS Performed at [facility], [address] [place], [place] [address]    Report Status PENDING  Incomplete  Culture, blood (Routine X 2) w Reflex to ID Panel     Status: None (Preliminary result)   Collection Time: 10/22/19  8:53 AM    Specimen: BLOOD RIGHT HAND  Result Value Ref Range Status   Specimen Description BLOOD RIGHT HAND  Final   Special Requests   Final    BOTTLES DRAWN AEROBIC AND ANAEROBIC Blood Culture adequate volume   Culture   Final    NO GROWTH < 24 HOURS Performed at [facility], [address] [place], [place] [address]    Report Status PENDING  Incomplete    Radiology Reports CT Angio Chest PE W and/or Wo Contrast  Result Date: 10/20/2019 CLINICAL DATA:  Shortness of breath, weakness, fever EXAM: CT ANGIOGRAPHY CHEST WITH CONTRAST TECHNIQUE: Multidetector CT imaging of the chest was performed using the standard protocol during bolus administration of intravenous contrast. Multiplanar CT image reconstructions and MIPs were obtained to evaluate the vascular anatomy. CONTRAST:  136m OMNIPAQUE IOHEXOL 350 MG/ML SOLN COMPARISON:  06/02/2014, 10/20/2019 FINDINGS: Cardiovascular: This is a technically adequate evaluation of the pulmonary vasculature. No acute filling defects or pulmonary emboli. The heart and great vessels are unremarkable without pericardial effusion. Mild atherosclerosis of the aortic arch. Mediastinum/Nodes: No enlarged mediastinal, hilar, or axillary lymph nodes. Thyroid gland, trachea, and esophagus demonstrate no significant findings. Lungs/Pleura: Chronic areas of scarring and fibrosis are seen throughout the lungs, greatest in the left lower lobe. No acute airspace disease, effusion, or pneumothorax. Bullous emphysematous changes are seen at the lung apices. Upper Abdomen: Calcified gallstones are identified without cholecystitis. No other acute upper abdominal findings. Musculoskeletal: No acute or destructive bony lesions. Reconstructed images demonstrate no additional findings. Review of the MIP images confirms the above findings. IMPRESSION: 1. No evidence of pulmonary embolus. 2. Emphysema, with basilar predominant areas of scarring and fibrosis. 3. Cholelithiasis without evidence of  cholecystitis. 4. Aortic Atherosclerosis (ICD10-I70.0) and Emphysema (ICD10-J43.9). Electronically Signed   By: MRanda NgoM.D.   On: 10/20/2019 22:34   UKoreaVenous Img Lower Bilateral (DVT)  Result Date: 10/22/2019 CLINICAL DATA:  Bilateral lower extremity pain and edema. History of pulmonary embolism. Evaluate for DVT. EXAM: BILATERAL LOWER EXTREMITY VENOUS DOPPLER ULTRASOUND TECHNIQUE: Gray-scale sonography with graded compression, as well as color Doppler and duplex ultrasound were performed to evaluate the lower extremity deep venous systems from the level of the common femoral vein and including the common femoral, femoral, profunda femoral, popliteal and calf veins including the posterior tibial, peroneal and gastrocnemius veins when visible. The superficial great saphenous vein was also interrogated. Spectral Doppler was utilized to evaluate flow at rest and with distal augmentation maneuvers in the common femoral, femoral and popliteal veins. COMPARISON:  None. FINDINGS: RIGHT LOWER EXTREMITY Common Femoral Vein: No evidence of thrombus. Normal compressibility, respiratory phasicity and response to augmentation. Saphenofemoral Junction: No evidence of thrombus. Normal compressibility and flow on color Doppler imaging. Profunda Femoral Vein: No evidence of thrombus. Normal compressibility and flow on color Doppler imaging. Femoral Vein: No evidence of thrombus. Normal compressibility, respiratory phasicity and response to augmentation. Popliteal Vein: No evidence of thrombus.  Normal compressibility, respiratory phasicity and response to augmentation. Calf Veins: No evidence of thrombus. Normal compressibility and flow on color Doppler imaging. Superficial Great Saphenous Vein: No evidence of thrombus. Normal compressibility. Venous Reflux:  None. Other Findings:  None. LEFT LOWER EXTREMITY Common Femoral Vein: No evidence of thrombus. Normal compressibility, respiratory phasicity and response to  augmentation. Saphenofemoral Junction: No evidence of thrombus. Normal compressibility and flow on color Doppler imaging. Profunda Femoral Vein: No evidence of thrombus. Normal compressibility and flow on color Doppler imaging. Femoral Vein: No evidence of thrombus. Normal compressibility, respiratory phasicity and response to augmentation. Popliteal Vein: No evidence of thrombus. Normal compressibility, respiratory phasicity and response to augmentation. Calf Veins: No evidence of thrombus. Normal compressibility and flow on color Doppler imaging. Superficial Great Saphenous Vein: No evidence of thrombus. Normal compressibility. Venous Reflux:  None. Other Findings:  None. IMPRESSION: No evidence of DVT within either lower extremity. Electronically Signed   By: Sandi Mariscal M.D.   On: 10/22/2019 14:19   DG Chest Port 1 View  Result Date: 10/20/2019 CLINICAL DATA:  59 year old male with possible sepsis. Testing for COVID-19 pending. EXAM: PORTABLE CHEST 1 VIEW COMPARISON:  Chest radiographs 03/29/2019 and earlier. FINDINGS: Portable AP upright view at 2057 hours. Lung volumes and mediastinal contours are stable. Visualized tracheal air column is within normal limits. Mild chronic interstitial markings appear stable. Otherwise allowing for portable technique the lungs are clear. No acute osseous abnormality identified. IMPRESSION: No acute cardiopulmonary abnormality. Electronically Signed   By: Genevie Ann M.D.   On: 10/20/2019 21:12     CBC Recent Labs  Lab 10/20/19 2021 10/21/19 0421 10/23/19 0632  WBC 7.4 6.1 5.7  HGB 13.3 12.9* 12.1*  HCT 41.1 40.0 36.5*  PLT 188 167 153  MCV 88.4 88.1 85.9  MCH 28.6 28.4 28.5  MCHC 32.4 32.3 33.2  RDW 14.1 14.3 13.6  LYMPHSABS 0.7  --   --   MONOABS 0.4  --   --   EOSABS 0.1  --   --   BASOSABS 0.0  --   --     Chemistries  Recent Labs  Lab 10/20/19 2021 10/21/19 0421 10/23/19 0632  NA 139 136 136  K 3.8 3.9 3.6  CL 101 101 102  CO2 _0 GLUCOSE 91 83 92  BUN 22* 18 19  CREATININE 1.07 0.97 1.05  CALCIUM 8.9 8.7* 8.4*  MG  --  1.7  --   AST _1 ALT 36 32 37  ALKPHOS 48 43 43  BILITOT 0.8 0.7 0.6   ------------------------------------------------------------------------------------------------------------------ Recent Labs    10/23/19 0632  CHOL 123  HDL 30*  LDLCALC 74  TRIG 93  CHOLHDL 4.1    Lab Results  Component Value Date   HGBA1C 6.1 (H) 10/06/2013   ------------------------------------------------------------------------------------------------------------------ Recent Labs    10/23/19 0632  TSH 1.777   ------------------------------------------------------------------------------------------------------------------ No results for input(s): VITAMINB12, FOLATE, FERRITIN, TIBC, IRON, RETICCTPCT in the last 72 hours.  Coagulation profile Recent Labs  Lab 10/20/19 2021 10/21/19 0421  INR 1.0 1.1    No results for input(s): DDIMER in the last 72 hours.  Cardiac Enzymes No results for input(s): CKMB, TROPONINI, MYOGLOBIN in the last 168 hours.  Invalid input(s): CK ------------------------------------------------------------------------------------------------------------------    Component Value Date/Time   BNP 76.0 10/20/2019 2021     Roxan Hockey M.D on 10/23/2019 at 7:00 PM  Go to [email or web address] - for contact info  [facility] -  Office  [phone]

## 2019-10-23 NOTE — Progress Notes (Signed)
Pharmacy Antibiotic Note  Today is day #4 of vancomycin and day #3 of Zosyn therapy for this 59 yo male with right lower leg cellulitis. Blood cultures from 7/14 grew S. hominis (MSSA), possibly a contaminant. Repeat blood cultures from 7/16 have shown no growth so far.   WBC count is WNL but patient remains febrile, with a Tmax of 103.58F.  Renal function remains stable.   Plan: Continue  vancomycin 1250 mg IV q12h-->will need vancomycin trough if tx continues Continue Zosyn 3.375g IV q8h (4-hr inf)  Pharmacy will continue to monitor renal function, vancomycin troughs as clinically appropriate,  cultures and patient progress.  Height: 5\' 10"  (177.8 cm) Weight: 135 kg (297 lb 11.2 oz) IBW/kg (Calculated) : 73  Temp (24hrs), Avg:101.3 F (38.5 C), Min:98.7 F (37.1 C), Max:103.2 F (39.6 C)  Recent Labs  Lab 10/20/19 2021 10/21/19 0421 10/23/19 0632  WBC 7.4 6.1 5.7  CREATININE 1.07 0.97 1.05  LATICACIDVEN 1.8  --   --    Current Estimated Creatinine Clearance: 107.4 mL/min (by C-G formula based on SCr of 1.05 mg/dL).   03/29/2019 Serum creatinine 0.99 and est CrCl > 100 ml/min using AdjBW of 97 kg  No Known Allergies  Microbiology results: 7/14  BC x2: MSSA CoNS in both bottles -->S. Hominis   (possible contaminant) 7/16 BC x2: NG x24h 7/14 Ucx: NGF  Thank you for allowing pharmacy to be a part of this patient's care.  Despina Pole, Pharm. D. Clinical Pharmacist 10/23/2019 2:03 PM

## 2019-10-24 LAB — VANCOMYCIN, TROUGH: Vancomycin Tr: 12 ug/mL — ABNORMAL LOW (ref 15–20)

## 2019-10-24 LAB — C-REACTIVE PROTEIN: CRP: 13.9 mg/dL — ABNORMAL HIGH (ref <1.0)

## 2019-10-24 LAB — SEDIMENTATION RATE: Sed Rate: 85 mm/hr — ABNORMAL HIGH (ref 0–16)

## 2019-10-24 MED ORDER — SODIUM CHLORIDE 0.9 % IV SOLN
100.0000 mg | Freq: Two times a day (BID) | INTRAVENOUS | Status: DC
  Administered 2019-10-24 – 2019-10-26 (×4): 100 mg via INTRAVENOUS
  Filled 2019-10-24 (×6): qty 100

## 2019-10-24 MED ORDER — CLOPIDOGREL BISULFATE 75 MG PO TABS
75.0000 mg | ORAL_TABLET | Freq: Every day | ORAL | Status: DC
  Administered 2019-10-26: 75 mg via ORAL
  Filled 2019-10-24: qty 1

## 2019-10-24 NOTE — Progress Notes (Signed)
Pharmacy Antibiotic Note  Today is day #5 of vancomycin and day #4 of Zosyn therapy for this 59 yo male with right lower leg cellulitis. Blood cultures from 7/14 grew S. hominis (MSSA), possibly a contaminant. Repeat blood cultures from 7/16 have shown no growth so far.   WBC count is WNL but patient remains febrile, with a Tmax of 103 F.  Renal function remains stable.  7/18 1215 update: Vancomycin trough: 84mcg/mL -->within therapeutic goal range of 10-10mcg/mL  Plan: Continue  vancomycin 1250 mg IV q12h-->will need vancomycin trough if tx continues Continue Zosyn 3.375g IV q8h (4-hr inf)  Pharmacy will continue to monitor renal function, vancomycin troughs as clinically appropriate,  cultures and patient progress.  Height: 5\' 10"  (177.8 cm) Weight: 135 kg (297 lb 11.2 oz) IBW/kg (Calculated) : 73  Temp (24hrs), Avg:100.8 F (38.2 C), Min:99.2 F (37.3 C), Max:103 F (39.4 C)  Recent Labs  Lab 10/20/19 2021 10/21/19 0421 10/23/19 0632 10/24/19 0608  WBC 7.4 6.1 5.7  --   CREATININE 1.07 0.97 1.05  --   LATICACIDVEN 1.8  --   --   --   VANCOTROUGH  --   --   --  12*   Current Estimated Creatinine Clearance: 107.4 mL/min (by C-G formula based on SCr of 1.05 mg/dL).   03/29/2019 Serum creatinine 0.99 and est CrCl > 100 ml/min using AdjBW of 97 kg  No Known Allergies  Microbiology results: 7/14  BC x2: MSSA CoNS in both bottles -->S. Hominis   (possible contaminant) 7/16 BC x2: NG x2 days 7/14 Ucx: NGF  Thank you for allowing pharmacy to be a part of this patient's care.  Despina Pole, Pharm. D. Clinical Pharmacist 10/24/2019 12:14 PM

## 2019-10-24 NOTE — Progress Notes (Signed)
Patient Demographics:    Colin Lee, is a 59 y.o. male, DOB - 10-22-1960, VWP:[id number]  Admit date - 10/20/2019   Admitting Physician No admitting provider for patient encounter.  Outpatient Primary MD for the patient is Colin Schwalbe, MD  LOS - 3   Chief Complaint  Patient presents with   Fever        Subjective:    Colin Lee today has no emesis,  No chest pain,    -Wife at bedside, -T-max 103.2 Chills and sweats persist  No Nausea, Vomiting or Diarrhea   Assessment  & Plan :    Principal Problem:   Bacteremia due to Gram-positive bacteria Active Problems:   Cellulitis and abscess of right leg with Bacteremia   CAD (coronary artery disease)   Morbid obesity due to excess calories (HCC)   Hypertension   Elevated troponin   Dyslipidemia   Acute febrile illness   GERD (gastroesophageal reflux disease)   COPD (chronic obstructive pulmonary disease) ([place])   Bacteremia  Brief Summary:- 59 y.o. male with medical history significant for HTN, HLD, CAD, GERD, COPD and Morbid Obesity admitted on 10/20/2019 with SIRs-and found to have [place] bacteremia most likely secondary to right leg cellulitis -Continues to have fevers and chills and sweats  A/p  1) acute febrile illness --very high fevers with chills and sweats persist- -- blood cultures from 10/20/2019 with staph hominis/CONS--suspect contaminant,  -Repeat blood cultures from  10/22/19 NGTD -Patient with high fevers, tachycardia, tachypnea and right lower extremity cellulitis,no leukocytosis despite steroid therapy ----patient meets SIRS criteria, patient has no hypoxia, no AKI, no metabolic encephalopathy, no lactic acidosis and no other evidence of endorgan dysfunction--patient does NOT meet criteria for sepsis at this time -CRP 17.4 >>13.9, ESR 3>>85,  -Procalcitonin < 0.10>> 0.13 -Patient may need echocardiogram 7/14  Ucx: NGF -Lyme titers and [place] spotted fever titers pending Discussed with Dr Michel Bickers from infectious disease --- differential diagnosis includes possible hypersensitivity reaction--requested that Dr. Arnoldo Morale and general surgeon try to do a punch biopsy of one of the newer skin eruptions for diagnostic purposes -Discontinue Vanco and Zosyn for now and start doxycycline on 10/24/19- just in case patient has tickborne infection--- pending further lab data  2) right lower extremity cellulitis--patient just returned from a trip to the beach---please see pictures in epic -  lower extremity Dopplers without acute DVT -Antibiotics as above in #1  3) COPD/emphysema--no acute flareup bronchodilators as ordered -CTA chest without acute findings  4)CAD--history of prior angioplasty and stent placement--no ACS type symptoms at this time, initial troponin is 24, repeat troponin is 24 -Continue aspirin, Plavix, metoprolol and Lipitor  5)RA--- longstanding history of rheumatoid arthritis, PTA patient was on chloroquine and prednisone -Continue prednisone at 20 mg daily, PTA patient was on 10 mg daily  6)Morbid Obesity- -Low calorie diet, portion control and increase physical activity discussed with patient -Body mass index is 42.72 kg/m.  Disposition/Need for in-Hospital Stay- patient unable to be discharged at this time due to --- cellulitis with persistent fevers and possible bacteremia requiring IV antibiotics pending final culture data   status is: Inpatient  Remains inpatient appropriate because:cellulitis with persistent fevers and possible bacteremia requiring IV antibiotics pending final culture data  Disposition: The patient is from: Home              Anticipated d/c is to: Home              Anticipated d/c date is: 2 days              Patient currently is not medically stable to d/c. Barriers: Not Clinically Stable- -cellulitis with persistent fevers and possible bacteremia  requiring IV antibiotics pending final culture data   Code Status : full  Family Communication:    (patient is alert, awake and coherent Discussed with wife at bedside Consults  : Dr. Michel Bickers from infectious disease  DVT Prophylaxis  :  Lovenox -  -    Lab Results  Component Value Date   PLT 153 10/23/2019   Inpatient Medications  Scheduled Meds:  aspirin EC  81 mg Oral Daily   atorvastatin  20 mg Oral Daily   [START ON 10/26/2019] clopidogrel  75 mg Oral Daily   enoxaparin (LOVENOX) injection  60 mg Subcutaneous QHS   metoprolol tartrate  12.5 mg Oral BID   pantoprazole  40 mg Oral Daily   predniSONE  20 mg Oral Daily   umeclidinium bromide  1 puff Inhalation Daily   Continuous Infusions:  doxycycline (VIBRAMYCIN) IV     PRN Meds:.acetaminophen, albuterol, nitroGLYCERIN    Anti-infectives (From admission, onward)   Start     Dose/Rate Route Frequency Ordered Stop   10/24/19 1500  doxycycline (VIBRAMYCIN) 100 mg in sodium chloride 0.9 % 250 mL IVPB     Discontinue     100 mg 125 mL/hr over 120 Minutes Intravenous Every 12 hours 10/24/19 1347     10/21/19 2200  ceFEPIme (MAXIPIME) 2 g in sodium chloride 0.9 % 100 mL IVPB  Status:  Discontinued        2 g 200 mL/hr over 30 Minutes Intravenous Every 8 hours 10/21/19 1710 10/21/19 1716   10/21/19 2200  piperacillin-tazobactam (ZOSYN) IVPB 3.375 g  Status:  Discontinued        3.375 g 12.5 mL/hr over 240 Minutes Intravenous Every 8 hours 10/21/19 1719 10/24/19 1346   10/21/19 1715  piperacillin-tazobactam (ZOSYN) IVPB 3.375 g  Status:  Discontinued        3.375 g 100 mL/hr over 30 Minutes Intravenous  Once 10/21/19 1705 10/21/19 1710   10/21/19 1715  vancomycin (VANCOCIN) IVPB 1000 mg/200 mL premix  Status:  Discontinued        1,000 mg 200 mL/hr over 60 Minutes Intravenous  Once 10/21/19 1705 10/21/19 1711   10/21/19 0600  ceFEPIme (MAXIPIME) 2 g in sodium chloride 0.9 % 100 mL IVPB  Status:  Discontinued         2 g 200 mL/hr over 30 Minutes Intravenous Every 8 hours 10/20/19 2104 10/21/19 1705   10/20/19 2115  vancomycin (VANCOREADY) IVPB 1250 mg/250 mL  Status:  Discontinued        1,250 mg 166.7 mL/hr over 90 Minutes Intravenous Every 12 hours 10/20/19 2101 10/24/19 1346   10/20/19 2100  ceFEPIme (MAXIPIME) 2 g in sodium chloride 0.9 % 100 mL IVPB        2 g 200 mL/hr over 30 Minutes Intravenous  Once 10/20/19 2047 10/20/19 2148   10/20/19 2100  metroNIDAZOLE (FLAGYL) IVPB 500 mg        500 mg 100 mL/hr over 60 Minutes Intravenous  Once 10/20/19 2047 10/20/19 2306   10/20/19 2100  vancomycin (  VANCOCIN) IVPB 1000 mg/200 mL premix  Status:  Discontinued        1,000 mg 200 mL/hr over 60 Minutes Intravenous  Once 10/20/19 2047 10/20/19 2101        Objective:   Vitals:   10/24/19 1101 10/24/19 1330 10/24/19 1348 10/24/19 1433  BP: (!) 151/83   (!) 104/49  Pulse: (!) 102   96  Resp: _0 Temp: (!) 103 F (39.4 C) (!) 102.1 F (38.9 C)  99.5 F (37.5 C)  TempSrc: Oral Oral  Oral  SpO2: 95%  92% 97%  Weight:      Height:        Wt Readings from Last 3 Encounters:  10/21/19 135 kg  03/29/19 133.4 kg  03/02/19 133.4 kg     Intake/Output Summary (Last 24 hours) at 10/24/2019 1523 Last data filed at 10/24/2019 1300 Gross per 24 hour  Intake 720 ml  Output --  Net 720 ml   Physical Exam Gen:- Awake Alert, morbidly obese, HEENT:- [place].AT, No sclera icterus Neck-Supple Neck,No JVD,.  Lungs-diminished breath sounds, no wheezing CV- S1, S2 normal, regular  Abd-  +ve B.Sounds, Abd Soft, No tenderness, increased truncal adiposity Extremity - No  edema, pedal pulses present  Psych-affect is appropriate, oriented x3 Neuro-no new focal deficits, no tremors Skin--- New extensive blanchable macular rash over upper torso anteriorly and posteriorly as well as on lower extremities mostly  -Please see photos in epic    Data Review:   Micro Results Recent Results (from the  past 240 hour(s))  SARS Coronavirus 2 by RT PCR (hospital order, performed in [facility] hospital lab) Nasopharyngeal Nasopharyngeal Swab     Status: None   Collection Time: 10/20/19  8:21 PM   Specimen: Nasopharyngeal Swab  Result Value Ref Range Status   SARS Coronavirus 2 NEGATIVE NEGATIVE Final    Comment: (NOTE) SARS-CoV-2 target nucleic acids are NOT DETECTED.  The SARS-CoV-2 RNA is generally detectable in upper and lower respiratory specimens during the acute phase of infection. The lowest concentration of SARS-CoV-2 viral copies this assay can detect is 250 copies / mL. A negative result does not preclude SARS-CoV-2 infection and should not be used as the sole basis for treatment or other patient management decisions.  A negative result may occur with improper specimen collection / handling, submission of specimen other than nasopharyngeal swab, presence of viral mutation(s) within the areas targeted by this assay, and inadequate number of viral copies (<250 copies / mL). A negative result must be combined with clinical observations, patient history, and epidemiological information.  Fact Sheet for Patients:   StrictlyIdeas.no  Fact Sheet for Healthcare Providers: BankingDealers.co.za  This test is not yet approved or  cleared by the [place] FDA and has been authorized for detection and/or diagnosis of SARS-CoV-2 by FDA under an Emergency Use Authorization (EUA).  This EUA will remain in effect (meaning this test can be used) for the duration of the COVID-19 declaration under Section 564(b)(1) of the Act, 21 U.S.C. section 360bbb-3(b)(1), unless the authorization is terminated or revoked sooner.  Performed at [facility], [address] [place], [place] [address]   Blood Culture (routine x 2)     Status: Abnormal   Collection Time: 10/20/19  8:21 PM   Specimen: BLOOD  Result Value Ref Range Status   Specimen  Description   Final    BLOOD RIGHT ANTECUBITAL Performed at [place],  [place] [address]    Special Requests   Final    BOTTLES DRAWN AEROBIC AND ANAEROBIC Blood Culture adequate volume Performed at [facility], [address] [place], [place] [address]    Culture  Setup Time   Final    IN BOTH AEROBIC AND ANAEROBIC BOTTLES GRAM POSITIVE COCCI Gram Stain Report Called to,Read Back By and Verified With: DANIEL MARTIN 10/21/19 _0  BY TJ Organism ID to follow CRITICAL RESULT CALLED TO, READ BACK BY AND VERIFIED WITH: Verlene Mayer RN _1  10/21/19 EB    Culture (A)  Final    STAPHYLOCOCCUS HOMINIS THE SIGNIFICANCE OF ISOLATING THIS ORGANISM FROM A SINGLE SET OF BLOOD CULTURES WHEN MULTIPLE SETS ARE DRAWN IS UNCERTAIN. PLEASE NOTIFY THE MICROBIOLOGY DEPARTMENT WITHIN ONE WEEK IF SPECIATION AND SENSITIVITIES ARE REQUIRED. Performed at [facility], [place] [address], [place], [place] [address]    Report Status 10/23/2019 FINAL  Final  Blood Culture ID Panel (Reflexed)     Status: Abnormal   Collection Time: 10/20/19  8:21 PM  Result Value Ref Range Status   Enterococcus species NOT DETECTED NOT DETECTED Final   Listeria monocytogenes NOT DETECTED NOT DETECTED Final   Staphylococcus species DETECTED (A) NOT DETECTED Final    Comment: Methicillin (oxacillin) susceptible coagulase negative staphylococcus. Possible blood culture contaminant (unless isolated from more than one blood culture draw or clinical case suggests pathogenicity). No antibiotic treatment is indicated for blood  culture contaminants. CRITICAL RESULT CALLED TO, READ BACK BY AND VERIFIED WITH: K,THOMAS RN _2  10/21/19 EB    Staphylococcus aureus (BCID) NOT DETECTED NOT DETECTED Final   Methicillin resistance NOT DETECTED NOT DETECTED Final   Streptococcus species NOT DETECTED NOT DETECTED Final   Streptococcus agalactiae NOT DETECTED NOT DETECTED Final   Streptococcus pneumoniae NOT  DETECTED NOT DETECTED Final   Streptococcus pyogenes NOT DETECTED NOT DETECTED Final   Acinetobacter baumannii NOT DETECTED NOT DETECTED Final   Enterobacteriaceae species NOT DETECTED NOT DETECTED Final   Enterobacter cloacae complex NOT DETECTED NOT DETECTED Final   Escherichia coli NOT DETECTED NOT DETECTED Final   Klebsiella oxytoca NOT DETECTED NOT DETECTED Final   Klebsiella pneumoniae NOT DETECTED NOT DETECTED Final   Proteus species NOT DETECTED NOT DETECTED Final   Serratia marcescens NOT DETECTED NOT DETECTED Final   Haemophilus influenzae NOT DETECTED NOT DETECTED Final   Neisseria meningitidis NOT DETECTED NOT DETECTED Final   Pseudomonas aeruginosa NOT DETECTED NOT DETECTED Final   Candida albicans NOT DETECTED NOT DETECTED Final   Candida glabrata NOT DETECTED NOT DETECTED Final   Candida krusei NOT DETECTED NOT DETECTED Final   Candida parapsilosis NOT DETECTED NOT DETECTED Final   Candida tropicalis NOT DETECTED NOT DETECTED Final    Comment: Performed at [facility], [place]. [address] [place], [place] [address]  Urine culture     Status: None   Collection Time: 10/20/19  8:46 PM   Specimen: In/Out Cath Urine  Result Value Ref Range Status   Specimen Description   Final    IN/OUT CATH URINE Performed at [facility], [address] [place], [place] [address]    Special Requests   Final    NONE Performed at [facility], [address], [place], [place] [address]    Culture   Final    NO GROWTH Performed at [facility], [place] [address], [place],  [address]    Report Status 10/22/2019 FINAL  Final  Blood Culture (routine x 2)  Status: None (Preliminary result)   Collection Time: 10/20/19  9:04 PM   Specimen: BLOOD RIGHT ARM  Result Value Ref Range Status   Specimen Description BLOOD RIGHT ARM  Final   Special Requests   Final    BOTTLES DRAWN AEROBIC AND ANAEROBIC Blood Culture adequate volume   Culture   Final    NO GROWTH 4  DAYS Performed at [facility], [address] [place], [place] [address]    Report Status PENDING  Incomplete  Culture, blood (Routine X 2) w Reflex to ID Panel     Status: None (Preliminary result)   Collection Time: 10/22/19  8:53 AM   Specimen: BLOOD LEFT HAND  Result Value Ref Range Status   Specimen Description BLOOD LEFT HAND  Final   Special Requests   Final    BOTTLES DRAWN AEROBIC AND ANAEROBIC Blood Culture adequate volume   Culture   Final    NO GROWTH 2 DAYS Performed at [facility], [address], [place], [place] [address]    Report Status PENDING  Incomplete  Culture, blood (Routine X 2) w Reflex to ID Panel     Status: None (Preliminary result)   Collection Time: 10/22/19  8:53 AM   Specimen: BLOOD RIGHT HAND  Result Value Ref Range Status   Specimen Description BLOOD RIGHT HAND  Final   Special Requests   Final    BOTTLES DRAWN AEROBIC AND ANAEROBIC Blood Culture adequate volume   Culture   Final    NO GROWTH 2 DAYS Performed at [facility], [address], [place], [place] [address]    Report Status PENDING  Incomplete    Radiology Reports CT Angio Chest PE W and/or Wo Contrast  Result Date: 10/20/2019 CLINICAL DATA:  Shortness of breath, weakness, fever EXAM: CT ANGIOGRAPHY CHEST WITH CONTRAST TECHNIQUE: Multidetector CT imaging of the chest was performed using the standard protocol during bolus administration of intravenous contrast. Multiplanar CT image reconstructions and MIPs were obtained to evaluate the vascular anatomy. CONTRAST:  110m OMNIPAQUE IOHEXOL 350 MG/ML SOLN COMPARISON:  06/02/2014, 10/20/2019 FINDINGS: Cardiovascular: This is a technically adequate evaluation of the pulmonary vasculature. No acute filling defects or pulmonary emboli. The heart and great vessels are unremarkable without pericardial effusion. Mild atherosclerosis of the aortic arch. Mediastinum/Nodes: No enlarged mediastinal, hilar, or axillary lymph nodes. Thyroid gland,  trachea, and esophagus demonstrate no significant findings. Lungs/Pleura: Chronic areas of scarring and fibrosis are seen throughout the lungs, greatest in the left lower lobe. No acute airspace disease, effusion, or pneumothorax. Bullous emphysematous changes are seen at the lung apices. Upper Abdomen: Calcified gallstones are identified without cholecystitis. No other acute upper abdominal findings. Musculoskeletal: No acute or destructive bony lesions. Reconstructed images demonstrate no additional findings. Review of the MIP images confirms the above findings. IMPRESSION: 1. No evidence of pulmonary embolus. 2. Emphysema, with basilar predominant areas of scarring and fibrosis. 3. Cholelithiasis without evidence of cholecystitis. 4. Aortic Atherosclerosis (ICD10-I70.0) and Emphysema (ICD10-J43.9). Electronically Signed   By: MRanda NgoM.D.   On: 10/20/2019 22:34   UK[place]Venous Img Lower Bilateral (DVT)  Result Date: 10/22/2019 CLINICAL DATA:  Bilateral lower extremity pain and edema. History of pulmonary embolism. Evaluate for DVT. EXAM: BILATERAL LOWER EXTREMITY VENOUS DOPPLER ULTRASOUND TECHNIQUE: Gray-scale sonography with graded compression, as well as color Doppler and duplex ultrasound were performed to evaluate the lower extremity deep venous systems from the level of the common femoral vein and including the common femoral, femoral, profunda  femoral, popliteal and calf veins including the posterior tibial, peroneal and gastrocnemius veins when visible. The superficial great saphenous vein was also interrogated. Spectral Doppler was utilized to evaluate flow at rest and with distal augmentation maneuvers in the common femoral, femoral and popliteal veins. COMPARISON:  None. FINDINGS: RIGHT LOWER EXTREMITY Common Femoral Vein: No evidence of thrombus. Normal compressibility, respiratory phasicity and response to augmentation. Saphenofemoral Junction: No evidence of thrombus. Normal compressibility  and flow on color Doppler imaging. Profunda Femoral Vein: No evidence of thrombus. Normal compressibility and flow on color Doppler imaging. Femoral Vein: No evidence of thrombus. Normal compressibility, respiratory phasicity and response to augmentation. Popliteal Vein: No evidence of thrombus. Normal compressibility, respiratory phasicity and response to augmentation. Calf Veins: No evidence of thrombus. Normal compressibility and flow on color Doppler imaging. Superficial Great Saphenous Vein: No evidence of thrombus. Normal compressibility. Venous Reflux:  None. Other Findings:  None. LEFT LOWER EXTREMITY Common Femoral Vein: No evidence of thrombus. Normal compressibility, respiratory phasicity and response to augmentation. Saphenofemoral Junction: No evidence of thrombus. Normal compressibility and flow on color Doppler imaging. Profunda Femoral Vein: No evidence of thrombus. Normal compressibility and flow on color Doppler imaging. Femoral Vein: No evidence of thrombus. Normal compressibility, respiratory phasicity and response to augmentation. Popliteal Vein: No evidence of thrombus. Normal compressibility, respiratory phasicity and response to augmentation. Calf Veins: No evidence of thrombus. Normal compressibility and flow on color Doppler imaging. Superficial Great Saphenous Vein: No evidence of thrombus. Normal compressibility. Venous Reflux:  None. Other Findings:  None. IMPRESSION: No evidence of DVT within either lower extremity. Electronically Signed   By: Sandi Mariscal M.D.   On: 10/22/2019 14:19   DG Chest Port 1 View  Result Date: 10/20/2019 CLINICAL DATA:  59 year old male with possible sepsis. Testing for COVID-19 pending. EXAM: PORTABLE CHEST 1 VIEW COMPARISON:  Chest radiographs 03/29/2019 and earlier. FINDINGS: Portable AP upright view at 2057 hours. Lung volumes and mediastinal contours are stable. Visualized tracheal air column is within normal limits. Mild chronic interstitial markings  appear stable. Otherwise allowing for portable technique the lungs are clear. No acute osseous abnormality identified. IMPRESSION: No acute cardiopulmonary abnormality. Electronically Signed   By: Genevie Ann M.D.   On: 10/20/2019 21:12     CBC Recent Labs  Lab 10/20/19 2021 10/21/19 0421 10/23/19 0632  WBC 7.4 6.1 5.7  HGB 13.3 12.9* 12.1*  HCT 41.1 40.0 36.5*  PLT 188 167 153  MCV 88.4 88.1 85.9  MCH 28.6 28.4 28.5  MCHC 32.4 32.3 33.2  RDW 14.1 14.3 13.6  LYMPHSABS 0.7  --   --   MONOABS 0.4  --   --   EOSABS 0.1  --   --   BASOSABS 0.0  --   --     Chemistries  Recent Labs  Lab 10/20/19 2021 10/21/19 0421 10/23/19 0632  NA 139 136 136  K 3.8 3.9 3.6  CL 101 101 102  CO2 _0 GLUCOSE 91 83 92  BUN 22* 18 19  CREATININE 1.07 0.97 1.05  CALCIUM 8.9 8.7* 8.4*  MG  --  1.7  --   AST _1 ALT 36 32 37  ALKPHOS 48 43 43  BILITOT 0.8 0.7 0.6   ------------------------------------------------------------------------------------------------------------------ Recent Labs    10/23/19 0632  CHOL 123  HDL 30*  LDLCALC 74  TRIG 93  CHOLHDL 4.1    Lab Results  Component Value Date   HGBA1C 6.1 (H) 10/06/2013   ------------------------------------------------------------------------------------------------------------------  Recent Labs    10/23/19 0632  TSH 1.777   ------------------------------------------------------------------------------------------------------------------ No results for input(s): VITAMINB12, FOLATE, FERRITIN, TIBC, IRON, RETICCTPCT in the last 72 hours.  Coagulation profile Recent Labs  Lab 10/20/19 2021 10/21/19 0421  INR 1.0 1.1    No results for input(s): DDIMER in the last 72 hours.  Cardiac Enzymes No results for input(s): CKMB, TROPONINI, MYOGLOBIN in the last 168 hours.  Invalid input(s): CK ------------------------------------------------------------------------------------------------------------------     Component Value Date/Time   BNP 76.0 10/20/2019 2021    Roxan Hockey M.D on 10/24/2019 at 3:23 PM  Go to [email or web address] - for contact info  [facility] - Office  [phone]

## 2019-10-24 NOTE — Progress Notes (Signed)
   10/24/19 1101  Assess: MEWS Score  Temp (!) 103 F (39.4 C)  BP (!) 151/83  Pulse Rate (!) 102  Resp 18  SpO2 95 %  O2 Device Room Air  Assess: MEWS Score  MEWS Temp 2  MEWS Systolic 0  MEWS Pulse 1  MEWS RR 0  MEWS LOC 0  MEWS Score 3  MEWS Score Color Yellow  Assess: if the MEWS score is Yellow or Red  Were vital signs taken at a resting state? Yes  Focused Assessment Documented focused assessment  Early Detection of Sepsis Score *See Row Information* Low  MEWS guidelines implemented *See Row Information* No, previously yellow, continue vital signs every 4 hours  Treat  MEWS Interventions Administered scheduled meds/treatments  Take Vital Signs  Increase Vital Sign Frequency  Yellow: Q 2hr X 2 then Q 4hr X 2, if remains yellow, continue Q 4hrs (fever)  Escalate  MEWS: Escalate Yellow: discuss with charge nurse/RN and consider discussing with provider and RRT  Notify: Charge Nurse/RN  Name of Charge Nurse/RN Notified L Lamaj Metoyer   Date Charge Nurse/RN Notified 10/24/19  Time Charge Nurse/RN Notified 1101  Notify: Provider  Provider Name/Title Dr. Joesph Fillers  Date Provider Notified 10/24/19  Time Provider Notified 1101  Notification Type Page  Notification Reason Other (Comment) (Patient has fever)  Date of Provider Response 10/24/19  Time of Provider Response 1101  Document  Patient Outcome Stabilized after interventions  Progress note created (see row info) Yes

## 2019-10-25 ENCOUNTER — Encounter (HOSPITAL_COMMUNITY)
Admission: EM | Disposition: A | Discharge status: Home / Self Care | Payer: Self-pay | Source: Home / Self Care | Attending: Family Medicine

## 2019-10-25 ENCOUNTER — Other Ambulatory Visit: Payer: Self-pay

## 2019-10-25 ENCOUNTER — Encounter (HOSPITAL_COMMUNITY): Payer: Self-pay | Admitting: Family Medicine

## 2019-10-25 ENCOUNTER — Other Ambulatory Visit: Payer: Self-pay | Admitting: General Surgery

## 2019-10-25 DIAGNOSIS — R21 Rash and other nonspecific skin eruption: Secondary | ICD-10-CM

## 2019-10-25 HISTORY — PX: MASS EXCISION: SHX2000

## 2019-10-25 LAB — CULTURE, BLOOD (ROUTINE X 2)
Culture: NO GROWTH
Special Requests: ADEQUATE

## 2019-10-25 SURGERY — MINOR EXCISION OF MASS
Anesthesia: LOCAL

## 2019-10-25 MED ORDER — DIPHENHYDRAMINE HCL 50 MG/ML IJ SOLN
25.0000 mg | Freq: Three times a day (TID) | INTRAMUSCULAR | Status: DC | PRN
  Filled 2019-10-25: qty 1

## 2019-10-25 MED ORDER — LIDOCAINE-EPINEPHRINE (PF) 1 %-1:200000 IJ SOLN
INTRAMUSCULAR | Status: DC | PRN
  Administered 2019-10-25: 10 mL

## 2019-10-25 MED ORDER — LIDOCAINE HCL (PF) 1 % IJ SOLN
INTRAMUSCULAR | Status: AC
  Filled 2019-10-25: qty 30

## 2019-10-25 SURGICAL SUPPLY — 30 items
CHLORAPREP W/TINT 26 (MISCELLANEOUS) ×2 IMPLANT
CLOTH BEACON ORANGE TIMEOUT ST (SAFETY) ×2 IMPLANT
COVER LIGHT HANDLE STERIS (MISCELLANEOUS) ×4 IMPLANT
COVER WAND RF STERILE (DRAPES) ×2 IMPLANT
DERMABOND ADVANCED (GAUZE/BANDAGES/DRESSINGS) ×1
DERMABOND ADVANCED .7 DNX12 (GAUZE/BANDAGES/DRESSINGS) ×1 IMPLANT
ELECT REM PT RETURN 9FT ADLT (ELECTROSURGICAL) ×2
ELECTRODE REM PT RTRN 9FT ADLT (ELECTROSURGICAL) ×1 IMPLANT
GLOVE BIOGEL PI IND STRL 7.0 (GLOVE) ×2 IMPLANT
GLOVE BIOGEL PI INDICATOR 7.0 (GLOVE) ×2
GLOVE SURG SS PI 7.5 STRL IVOR (GLOVE) ×2 IMPLANT
GOWN STRL REUS W/TWL LRG LVL3 (GOWN DISPOSABLE) ×4 IMPLANT
KIT TURNOVER KIT A (KITS) ×2 IMPLANT
MANIFOLD NEPTUNE II (INSTRUMENTS) ×2 IMPLANT
NDL HYPO 18GX1.5 BLUNT FILL (NEEDLE) IMPLANT
NDL HYPO 25X1 1.5 SAFETY (NEEDLE) ×1 IMPLANT
NEEDLE HYPO 18GX1.5 BLUNT FILL (NEEDLE) IMPLANT
NEEDLE HYPO 25X1 1.5 SAFETY (NEEDLE) ×2 IMPLANT
NS IRRIG 1000ML POUR BTL (IV SOLUTION) ×2 IMPLANT
PACK MINOR (CUSTOM PROCEDURE TRAY) ×1 IMPLANT
PAD ARMBOARD 7.5X6 YLW CONV (MISCELLANEOUS) ×2 IMPLANT
SET BASIN LINEN APH (SET/KITS/TRAYS/PACK) ×2 IMPLANT
SOL PREP PROV IODINE SCRUB 4OZ (MISCELLANEOUS) IMPLANT
STRIP CLOSURE SKIN 1/2X4 (GAUZE/BANDAGES/DRESSINGS) ×2 IMPLANT
SUT MNCRL AB 4-0 PS2 18 (SUTURE) ×2 IMPLANT
SUT PROLENE 3 0 PS 1 (SUTURE) IMPLANT
SUT VIC AB 3-0 SH 27 (SUTURE)
SUT VIC AB 3-0 SH 27X BRD (SUTURE) IMPLANT
SYR BULB IRRIG 60ML STRL (SYRINGE) ×2 IMPLANT
SYR CONTROL 10ML LL (SYRINGE) ×2 IMPLANT

## 2019-10-25 NOTE — H&P (View-Only) (Signed)
Reason for Consult: Fever of unknown etiology, rash Referring Physician: Dr. Lodema Pilot is an 59 y.o. male.  HPI: Patient is a 59 year old white male who was referred to my care for a skin biopsy of a rash.  He has a fever of unknown etiology and the hospitalist has requested a punch biopsy of one of the lesions.  Past Medical History:  Diagnosis Date  . Allergy   . Arthritis   . Asthma   . Coronary artery disease 2016   90% RCA and 30% LM S/P PCI of RCA  . GERD (gastroesophageal reflux disease)   . Headache    stress and tension  . History of nuclear stress test    Myoview 1/19: EF 61, no ischemia, low risk  . Hyperlipidemia   . Hypertension   . Neuromuscular disorder (HCC)    restless legs and leg cramps  . Rheumatoid arthritis ([place]) 06/2019  . Shortness of breath    Starting May 2015  . Tubular adenoma of colon 03/2013    Past Surgical History:  Procedure Laterality Date  . CHEST TUBE INSERTION Left 02/01/2014   Procedure: INSERTION PLEURAL DRAINAGE CATHETER;  Surgeon: Grace Isaac, MD;  Location: [place];  Service: Thoracic;  Laterality: Left;  . COLONOSCOPY    . COLONOSCOPY W/ BIOPSIES AND POLYPECTOMY  2014   benign  . LEFT HEART CATHETERIZATION WITH CORONARY ANGIOGRAM N/A 06/07/2014   Procedure: LEFT HEART CATHETERIZATION WITH CORONARY ANGIOGRAM;  Surgeon: Peter M Martinique, MD;  Location[facility] CATH LAB;  Service: Cardiovascular;  Laterality: N/A;  . LEFT HEART CATHETERIZATION WITH CORONARY ANGIOGRAM N/A 06/08/2014   Procedure: LEFT HEART CATHETERIZATION WITH CORONARY ANGIOGRAM;  Surgeon: Sinclair Grooms, MD;  Location: [facility] CATH LAB;  Service: Cardiovascular;  Laterality: N/A;  . no prior surgery    . PLEURAL BIOPSY Left 02/01/2014   Procedure: PLEURAL BIOPSY;  Surgeon: Grace Isaac, MD;  Location: [place];  Service: Thoracic;  Laterality: Left;  . PLEURAL EFFUSION DRAINAGE Left 02/01/2014   Procedure: DRAINAGE OF PLEURAL EFFUSION;  Surgeon: Grace Isaac, MD;  Location: [place];  Service: Thoracic;  Laterality: Left;  . REMOVAL OF PLEURAL DRAINAGE CATHETER Left 02/16/2014   Procedure: REMOVAL OF PLEURAL DRAINAGE CATHETER;  Surgeon: Grace Isaac, MD;  Location: [place];  Service: Thoracic;  Laterality: Left;  . TALC PLEURODESIS Left 02/01/2014   Procedure: Pietro Cassis;  Surgeon: Grace Isaac, MD;  Location: [place];  Service: Thoracic;  Laterality: Left;  . THORACENTESIS Left 2015  . VIDEO ASSISTED THORACOSCOPY Left 02/01/2014   Procedure: VIDEO ASSISTED THORACOSCOPY;  Surgeon: Grace Isaac, MD;  Location: [place];  Service: Thoracic;  Laterality: Left;  Marland Kitchen VIDEO BRONCHOSCOPY N/A 02/01/2014   Procedure: VIDEO BRONCHOSCOPY;  Surgeon: Grace Isaac, MD;  Location[facility] OR;  Service: Thoracic;  Laterality: N/A;    Family History  Adopted: Yes  Problem Relation Age of Onset  . Colon cancer Neg Hx   . Esophageal cancer Neg Hx   . Rectal cancer Neg Hx   . Stomach cancer Neg Hx     Social History:  reports that he quit smoking about 19 years ago. His smoking use included cigarettes. He has a 30.00 pack-year smoking history. He has never used smokeless tobacco. He reports current alcohol use. He reports that he does not use drugs.  Allergies: No Known Allergies  Medications: I have reviewed the patient's current medications.  Results for orders  placed or performed during the hospital encounter of 10/20/19 (from the past 48 hour(s))  C-reactive protein     Status: Abnormal   Collection Time: 10/24/19  6:08 AM  Result Value Ref Range   CRP 13.9 (H) <1.0 mg/dL    Comment: Performed at [facility], [address], [place], [place] [address]  Sedimentation rate     Status: Abnormal   Collection Time: 10/24/19  6:08 AM  Result Value Ref Range   Sed Rate 85 (H) 0 - 16 mm/hr    Comment: Performed at [facility], [address], [place], [place] [address]  Vancomycin, trough     Status: Abnormal   Collection Time: 10/24/19   6:08 AM  Result Value Ref Range   Vancomycin Tr 12 (L) 15 - 20 ug/mL    Comment: Performed at [facility], [place] [address], [place], [place] [address]    No results found.  ROS:  Pertinent items are noted in HPI.  Blood pressure 130/73, pulse 82, temperature 98.8 F (37.1 C), temperature source Oral, resp. rate 18, height 5\' 10"  (1.778 m), weight 135 kg, SpO2 98 %. Physical Exam: Pleasant white male no acute distress Head is normocephalic, atraumatic Lungs clear auscultation with equal breath sounds bilaterally Heart examination reveals a regular rate and rhythm without S3, S4, murmurs Skin examination reveals a diffuse erythematous rash on both lower extremities and upper chest.  Assessment/Plan: Impression: Fever of unknown etiology, rash Plan: Patient will undergo skin biopsy of one of the skin lesions today.  The risks and benefits of the procedure were fully explained to the patient, who gave informed consent.  Aviva Signs 10/25/2019, 7:08 AM

## 2019-10-25 NOTE — Progress Notes (Signed)
Patient Demographics:    Colin Lee, is a 59 y.o. male, DOB - 19-Oct-1960, LGX:[id number]  Admit date - 10/20/2019   Admitting Physician Francis Yardley Denton Brick, MD  Outpatient Primary MD for the patient is [name], MD  LOS - 4  Chief Complaint  Patient presents with  . Fever        Subjective:    Colin Lee today has no emesis,  No chest pain,    -Recurrent fevers persist--- temperature above 103, -- tolerated punch biopsy well  No Nausea, Vomiting or Diarrhea   Assessment  & Plan :    Principal Problem:   Bacteremia due to Gram-positive bacteria Active Problems:   Cellulitis and abscess of right leg with Bacteremia   CAD (coronary artery disease)   Morbid obesity due to excess calories (HCC)   Hypertension   Elevated troponin   Dyslipidemia   Acute febrile illness   GERD (gastroesophageal reflux disease)   COPD (chronic obstructive pulmonary disease) (HCC)   Bacteremia   Rash and nonspecific skin eruption  Brief Summary:- 59 y.o. male with medical history significant for HTN, HLD, CAD, GERD, COPD and Morbid Obesity admitted on 10/20/2019 with SIRs-and found to have [place] bacteremia most likely secondary to right leg cellulitis -Continues to have fevers and chills and sweats  A/p  1) acute febrile illness --very high fevers with chills and sweats persist- -- blood cultures from 10/20/2019 with staph hominis/CONS--suspect contaminant,  -Repeat blood cultures from  10/22/19 NGTD -Patient with high fevers, tachycardia, tachypnea and right lower extremity cellulitis,no leukocytosis despite steroid therapy ----patient meets SIRS criteria, patient has no hypoxia, no AKI, no metabolic encephalopathy, no lactic acidosis and no other evidence of endorgan dysfunction--patient does NOT meet criteria for sepsis at this time -CRP 17.4 >>13.9, ESR 3>>85,  -Procalcitonin < 0.10>>  0.13 -Patient may need echocardiogram 7/14 Ucx: NGF -Lyme titers and [place] spotted fever titers pending Discussed with Dr Michel Bickers from infectious disease --- differential diagnosis includes possible hypersensitivity reaction--requested that Dr. Arnoldo Morale and general surgeon try to do a punch biopsy of one of the newer skin eruptions for diagnostic purposes -Punch biopsy of skin, right calf, right upper chest, left arm (punch biopsy x 3) obtained on 10/25/2019 -Discontinued Vanco and Zosyn for now and start doxycycline on 10/24/19- just in case patient has tickborne infection--- pending further lab data -Lyme and [place] spotted fever labs pending  2)Right lower extremity cellulitis--patient just returned from a trip to the beach---please see pictures in epic -  lower extremity Dopplers without acute DVT -Antibiotics as above in #1 -Punch biopsy as above in #1  3) COPD/emphysema--no acute flareup bronchodilators as ordered -CTA chest without acute findings  4)CAD--history of prior angioplasty and stent placement--no ACS type symptoms at this time, initial troponin is 24, repeat troponin is 24 -Continue aspirin, Plavix, metoprolol and Lipitor  5)RA--- longstanding history of rheumatoid arthritis, PTA patient was on chloroquine and prednisone -Continue prednisone at 20 mg daily, PTA patient was on 10 mg daily  6)Morbid Obesity- -Low calorie diet, portion control and increase physical activity discussed with patient -Body mass index is 42.72 kg/m.  Disposition/Need for in-Hospital Stay- patient unable to be discharged at this time due to --- cellulitis with  persistent fevers and possible bacteremia requiring IV antibiotics pending final culture data   status is: Inpatient  Remains inpatient appropriate because:cellulitis with persistent fevers and possible bacteremia requiring IV antibiotics pending final culture data    Disposition: The patient is from: Home               Anticipated d/c is to: Home              Anticipated d/c date is: 2 days              Patient currently is not medically stable to d/c. Barriers: Not Clinically Stable- -cellulitis with persistent fevers and possible bacteremia requiring IV antibiotics pending final culture data   Code Status : full  Procedures:- Punch biopsy of skin, right calf, right upper chest, left arm  Family Communication:    (patient is alert, awake and coherent Discussed with wife at bedside Consults  : Dr. Michel Bickers from infectious disease  DVT Prophylaxis  :  Lovenox -  -    Lab Results  Component Value Date   PLT 153 10/23/2019   Inpatient Medications  Scheduled Meds: . aspirin EC  81 mg Oral Daily  . atorvastatin  20 mg Oral Daily  . [START ON 10/26/2019] clopidogrel  75 mg Oral Daily  . enoxaparin (LOVENOX) injection  60 mg Subcutaneous QHS  . metoprolol tartrate  12.5 mg Oral BID  . pantoprazole  40 mg Oral Daily  . predniSONE  20 mg Oral Daily  . umeclidinium bromide  1 puff Inhalation Daily   Continuous Infusions: . doxycycline (VIBRAMYCIN) IV 100 mg (10/25/19 1620)   PRN Meds:.acetaminophen, albuterol, nitroGLYCERIN    Anti-infectives (From admission, onward)   Start     Dose/Rate Route Frequency Ordered Stop   10/24/19 1500  doxycycline (VIBRAMYCIN) 100 mg in sodium chloride 0.9 % 250 mL IVPB     Discontinue     100 mg 125 mL/hr over 120 Minutes Intravenous Every 12 hours 10/24/19 1347     10/21/19 2200  ceFEPIme (MAXIPIME) 2 g in sodium chloride 0.9 % 100 mL IVPB  Status:  Discontinued        2 g 200 mL/hr over 30 Minutes Intravenous Every 8 hours 10/21/19 1710 10/21/19 1716   10/21/19 2200  piperacillin-tazobactam (ZOSYN) IVPB 3.375 g  Status:  Discontinued        3.375 g 12.5 mL/hr over 240 Minutes Intravenous Every 8 hours 10/21/19 1719 10/24/19 1346   10/21/19 1715  piperacillin-tazobactam (ZOSYN) IVPB 3.375 g  Status:  Discontinued        3.375 g 100 mL/hr over 30  Minutes Intravenous  Once 10/21/19 1705 10/21/19 1710   10/21/19 1715  vancomycin (VANCOCIN) IVPB 1000 mg/200 mL premix  Status:  Discontinued        1,000 mg 200 mL/hr over 60 Minutes Intravenous  Once 10/21/19 1705 10/21/19 1711   10/21/19 0600  ceFEPIme (MAXIPIME) 2 g in sodium chloride 0.9 % 100 mL IVPB  Status:  Discontinued        2 g 200 mL/hr over 30 Minutes Intravenous Every 8 hours 10/20/19 2104 10/21/19 1705   10/20/19 2115  vancomycin (VANCOREADY) IVPB 1250 mg/250 mL  Status:  Discontinued        1,250 mg 166.7 mL/hr over 90 Minutes Intravenous Every 12 hours 10/20/19 2101 10/24/19 1346   10/20/19 2100  ceFEPIme (MAXIPIME) 2 g in sodium chloride 0.9 % 100 mL IVPB  2 g 200 mL/hr over 30 Minutes Intravenous  Once 10/20/19 2047 10/20/19 2148   10/20/19 2100  metroNIDAZOLE (FLAGYL) IVPB 500 mg        500 mg 100 mL/hr over 60 Minutes Intravenous  Once 10/20/19 2047 10/20/19 2306   10/20/19 2100  vancomycin (VANCOCIN) IVPB 1000 mg/200 mL premix  Status:  Discontinued        1,000 mg 200 mL/hr over 60 Minutes Intravenous  Once 10/20/19 2047 10/20/19 2101        Objective:   Vitals:   10/25/19 0512 10/25/19 1123 10/25/19 1202 10/25/19 1410  BP: 130/73  (!) 147/67 106/84  Pulse: 82  (!) 106 98  Resp: '18  18 18  ' Temp: 98.8 F (37.1 C)  (!) 102.5 F (39.2 C) 99.8 F (37.7 C)  TempSrc: Oral  Oral Oral  SpO2: 98% 97% 96% 98%  Weight:      Height:        Wt Readings from Last 3 Encounters:  10/21/19 135 kg  03/29/19 133.4 kg  03/02/19 133.4 kg     Intake/Output Summary (Last 24 hours) at 10/25/2019 1804 Last data filed at 10/25/2019 1500 Gross per 24 hour  Intake 1036.72 ml  Output 700 ml  Net 336.72 ml   Physical Exam Gen:- Awake Alert, morbidly obese, HEENT:- [place].AT, No sclera icterus Neck-Supple Neck,No JVD,.  Lungs-diminished breath sounds, no wheezing CV- S1, S2 normal, regular  Abd-  +ve B.Sounds, Abd Soft, No tenderness, increased truncal  adiposity Extremity - No  edema, pedal pulses present  Psych-affect is appropriate, oriented x3 Neuro-no new focal deficits, no tremors Skin--- New extensive blanchable macular rash over upper torso anteriorly and posteriorly as well as on lower extremities  -Please see photos in epic    Data Review:   Micro Results Recent Results (from the past 240 hour(s))  SARS Coronavirus 2 by RT PCR (hospital order, performed in [facility] hospital lab) Nasopharyngeal Nasopharyngeal Swab     Status: None   Collection Time: 10/20/19  8:21 PM   Specimen: Nasopharyngeal Swab  Result Value Ref Range Status   SARS Coronavirus 2 NEGATIVE NEGATIVE Final    Comment: (NOTE) SARS-CoV-2 target nucleic acids are NOT DETECTED.  The SARS-CoV-2 RNA is generally detectable in upper and lower respiratory specimens during the acute phase of infection. The lowest concentration of SARS-CoV-2 viral copies this assay can detect is 250 copies / mL. A negative result does not preclude SARS-CoV-2 infection and should not be used as the sole basis for treatment or other patient management decisions.  A negative result may occur with improper specimen collection / handling, submission of specimen other than nasopharyngeal swab, presence of viral mutation(s) within the areas targeted by this assay, and inadequate number of viral copies (<250 copies / mL). A negative result must be combined with clinical observations, patient history, and epidemiological information.  Fact Sheet for Patients:   StrictlyIdeas.no  Fact Sheet for Healthcare Providers: BankingDealers.co.za  This test is not yet approved or  cleared by the [place] FDA and has been authorized for detection and/or diagnosis of SARS-CoV-2 by FDA under an Emergency Use Authorization (EUA).  This EUA will remain in effect (meaning this test can be used) for the duration of the COVID-19 declaration under  Section 564(b)(1) of the Act, 21 U.S.C. section 360bbb-3(b)(1), unless the authorization is terminated or revoked sooner.  Performed at [facility], [address], [place], [place] [address]   Blood Culture (routine x  2)     Status: Abnormal   Collection Time: 10/20/19  8:21 PM   Specimen: BLOOD  Result Value Ref Range Status   Specimen Description   Final    BLOOD RIGHT ANTECUBITAL Performed at [facility], [place] [address], [place], [place] [address]    Special Requests   Final    BOTTLES DRAWN AEROBIC AND ANAEROBIC Blood Culture adequate volume Performed at [facility], [address], [place], [place] [address]    Culture  Setup Time   Final    IN BOTH AEROBIC AND ANAEROBIC BOTTLES GRAM POSITIVE COCCI Gram Stain Report Called to,Read Back By and Verified With: DANIEL MARTIN 10/21/19 '@1610'  BY TJ Organism ID to follow CRITICAL RESULT CALLED TO, READ BACK BY AND VERIFIED WITH: Verlene Mayer RN '@[id number]'  10/21/19 EB    Culture (A)  Final    STAPHYLOCOCCUS HOMINIS THE SIGNIFICANCE OF ISOLATING THIS ORGANISM FROM A SINGLE SET OF BLOOD CULTURES WHEN MULTIPLE SETS ARE DRAWN IS UNCERTAIN. PLEASE NOTIFY THE MICROBIOLOGY DEPARTMENT WITHIN ONE WEEK IF SPECIATION AND SENSITIVITIES ARE REQUIRED. Performed at [facility], [place] [address], [place], [place] [address]    Report Status 10/23/2019 FINAL  Final  Blood Culture ID Panel (Reflexed)     Status: Abnormal   Collection Time: 10/20/19  8:21 PM  Result Value Ref Range Status   Enterococcus species NOT DETECTED NOT DETECTED Final   Listeria monocytogenes NOT DETECTED NOT DETECTED Final   Staphylococcus species DETECTED (A) NOT DETECTED Final    Comment: Methicillin (oxacillin) susceptible coagulase negative staphylococcus. Possible blood culture contaminant (unless isolated from more than one blood culture draw or clinical case suggests pathogenicity). No antibiotic treatment is indicated for blood  culture contaminants. CRITICAL RESULT  CALLED TO, READ BACK BY AND VERIFIED WITH: K,THOMAS RN '@[id number]'  10/21/19 EB    Staphylococcus aureus (BCID) NOT DETECTED NOT DETECTED Final   Methicillin resistance NOT DETECTED NOT DETECTED Final   Streptococcus species NOT DETECTED NOT DETECTED Final   Streptococcus agalactiae NOT DETECTED NOT DETECTED Final   Streptococcus pneumoniae NOT DETECTED NOT DETECTED Final   Streptococcus pyogenes NOT DETECTED NOT DETECTED Final   Acinetobacter baumannii NOT DETECTED NOT DETECTED Final   Enterobacteriaceae species NOT DETECTED NOT DETECTED Final   Enterobacter cloacae complex NOT DETECTED NOT DETECTED Final   Escherichia coli NOT DETECTED NOT DETECTED Final   Klebsiella oxytoca NOT DETECTED NOT DETECTED Final   Klebsiella pneumoniae NOT DETECTED NOT DETECTED Final   Proteus species NOT DETECTED NOT DETECTED Final   Serratia marcescens NOT DETECTED NOT DETECTED Final   Haemophilus influenzae NOT DETECTED NOT DETECTED Final   Neisseria meningitidis NOT DETECTED NOT DETECTED Final   Pseudomonas aeruginosa NOT DETECTED NOT DETECTED Final   Candida albicans NOT DETECTED NOT DETECTED Final   Candida glabrata NOT DETECTED NOT DETECTED Final   Candida krusei NOT DETECTED NOT DETECTED Final   Candida parapsilosis NOT DETECTED NOT DETECTED Final   Candida tropicalis NOT DETECTED NOT DETECTED Final    Comment: Performed at [facility], [place] [place], [place] [address]  Urine culture     Status: None   Collection Time: 10/20/19  8:46 PM   Specimen: In/Out Cath Urine  Result Value Ref Range Status   Specimen Description   Final    IN/OUT CATH URINE Performed at [facility], [address] [place], [place] [address]    Special Requests   Final    NONE Performed at  [address] [place], [place] [address]    Culture   Final    NO GROWTH Performed at [facility], [place] [address] [place], [place] [address]    Report Status 10/22/2019 FINAL  Final  Blood  Culture (routine x 2)     Status: None   Collection Time: 10/20/19  9:04 PM   Specimen: BLOOD RIGHT ARM  Result Value Ref Range Status   Specimen Description BLOOD RIGHT ARM  Final   Special Requests   Final    BOTTLES DRAWN AEROBIC AND ANAEROBIC Blood Culture adequate volume   Culture   Final    NO GROWTH 5 DAYS Performed at [facility], [address] [place], [place] [address]    Report Status 10/25/2019 FINAL  Final  Culture, blood (Routine X 2) w Reflex to ID Panel     Status: None (Preliminary result)   Collection Time: 10/22/19  8:53 AM   Specimen: BLOOD LEFT HAND  Result Value Ref Range Status   Specimen Description BLOOD LEFT HAND  Final   Special Requests   Final    BOTTLES DRAWN AEROBIC AND ANAEROBIC Blood Culture adequate volume   Culture   Final    NO GROWTH 3 DAYS Performed at [facility], [address] [place], [place] [address]    Report Status PENDING  Incomplete  Culture, blood (Routine X 2) w Reflex to ID Panel     Status: None (Preliminary result)   Collection Time: 10/22/19  8:53 AM   Specimen: BLOOD RIGHT HAND  Result Value Ref Range Status   Specimen Description BLOOD RIGHT HAND  Final   Special Requests   Final    BOTTLES DRAWN AEROBIC AND ANAEROBIC Blood Culture adequate volume   Culture   Final    NO GROWTH 3 DAYS Performed at [facility], [address], [place], [place] [address]    Report Status PENDING  Incomplete    Radiology Reports CT Angio Chest PE W and/or Wo Contrast  Result Date: 10/20/2019 CLINICAL DATA:  Shortness of breath, weakness, fever EXAM: CT ANGIOGRAPHY CHEST WITH CONTRAST TECHNIQUE: Multidetector CT imaging of the chest was performed using the standard protocol during bolus administration of intravenous contrast. Multiplanar CT image reconstructions and MIPs were obtained to evaluate the vascular anatomy. CONTRAST:  168m OMNIPAQUE IOHEXOL 350 MG/ML SOLN COMPARISON:  06/02/2014, 10/20/2019 FINDINGS: Cardiovascular: This is a  technically adequate evaluation of the pulmonary vasculature. No acute filling defects or pulmonary emboli. The heart and great vessels are unremarkable without pericardial effusion. Mild atherosclerosis of the aortic arch. Mediastinum/Nodes: No enlarged mediastinal, hilar, or axillary lymph nodes. Thyroid gland, trachea, and esophagus demonstrate no significant findings. Lungs/Pleura: Chronic areas of scarring and fibrosis are seen throughout the lungs, greatest in the left lower lobe. No acute airspace disease, effusion, or pneumothorax. Bullous emphysematous changes are seen at the lung apices. Upper Abdomen: Calcified gallstones are identified without cholecystitis. No other acute upper abdominal findings. Musculoskeletal: No acute or destructive bony lesions. Reconstructed images demonstrate no additional findings. Review of the MIP images confirms the above findings. IMPRESSION: 1. No evidence of pulmonary embolus. 2. Emphysema, with basilar predominant areas of scarring and fibrosis. 3. Cholelithiasis without evidence of cholecystitis. 4. Aortic Atherosclerosis (ICD10-I70.0) and Emphysema (ICD10-J43.9). Electronically Signed   By: MRanda NgoM.D.   On: 10/20/2019 22:34   UKoreaVenous Img Lower Bilateral (DVT)  Result Date: 10/22/2019 CLINICAL DATA:  Bilateral lower extremity pain and edema. History  of pulmonary embolism. Evaluate for DVT. EXAM: BILATERAL LOWER EXTREMITY VENOUS DOPPLER ULTRASOUND TECHNIQUE: Gray-scale sonography with graded compression, as well as color Doppler and duplex ultrasound were performed to evaluate the lower extremity deep venous systems from the level of the common femoral vein and including the common femoral, femoral, profunda femoral, popliteal and calf veins including the posterior tibial, peroneal and gastrocnemius veins when visible. The superficial great saphenous vein was also interrogated. Spectral Doppler was utilized to evaluate flow at rest and with distal  augmentation maneuvers in the common femoral, femoral and popliteal veins. COMPARISON:  None. FINDINGS: RIGHT LOWER EXTREMITY Common Femoral Vein: No evidence of thrombus. Normal compressibility, respiratory phasicity and response to augmentation. Saphenofemoral Junction: No evidence of thrombus. Normal compressibility and flow on color Doppler imaging. Profunda Femoral Vein: No evidence of thrombus. Normal compressibility and flow on color Doppler imaging. Femoral Vein: No evidence of thrombus. Normal compressibility, respiratory phasicity and response to augmentation. Popliteal Vein: No evidence of thrombus. Normal compressibility, respiratory phasicity and response to augmentation. Calf Veins: No evidence of thrombus. Normal compressibility and flow on color Doppler imaging. Superficial Great Saphenous Vein: No evidence of thrombus. Normal compressibility. Venous Reflux:  None. Other Findings:  None. LEFT LOWER EXTREMITY Common Femoral Vein: No evidence of thrombus. Normal compressibility, respiratory phasicity and response to augmentation. Saphenofemoral Junction: No evidence of thrombus. Normal compressibility and flow on color Doppler imaging. Profunda Femoral Vein: No evidence of thrombus. Normal compressibility and flow on color Doppler imaging. Femoral Vein: No evidence of thrombus. Normal compressibility, respiratory phasicity and response to augmentation. Popliteal Vein: No evidence of thrombus. Normal compressibility, respiratory phasicity and response to augmentation. Calf Veins: No evidence of thrombus. Normal compressibility and flow on color Doppler imaging. Superficial Great Saphenous Vein: No evidence of thrombus. Normal compressibility. Venous Reflux:  None. Other Findings:  None. IMPRESSION: No evidence of DVT within either lower extremity. Electronically Signed   By: Sandi Mariscal M.D.   On: 10/22/2019 14:19   DG Chest Port 1 View  Result Date: 10/20/2019 CLINICAL DATA:  59 year old male with  possible sepsis. Testing for COVID-19 pending. EXAM: PORTABLE CHEST 1 VIEW COMPARISON:  Chest radiographs 03/29/2019 and earlier. FINDINGS: Portable AP upright view at 2057 hours. Lung volumes and mediastinal contours are stable. Visualized tracheal air column is within normal limits. Mild chronic interstitial markings appear stable. Otherwise allowing for portable technique the lungs are clear. No acute osseous abnormality identified. IMPRESSION: No acute cardiopulmonary abnormality. Electronically Signed   By: Genevie Ann M.D.   On: 10/20/2019 21:12     CBC Recent Labs  Lab 10/20/19 2021 10/21/19 0421 10/23/19 0632  WBC 7.4 6.1 5.7  HGB 13.3 12.9* 12.1*  HCT 41.1 40.0 36.5*  PLT 188 167 153  MCV 88.4 88.1 85.9  MCH 28.6 28.4 28.5  MCHC 32.4 32.3 33.2  RDW 14.1 14.3 13.6  LYMPHSABS 0.7  --   --   MONOABS 0.4  --   --   EOSABS 0.1  --   --   BASOSABS 0.0  --   --     Chemistries  Recent Labs  Lab 10/20/19 2021 10/21/19 0421 10/23/19 0632  NA 139 136 136  K 3.8 3.9 3.6  CL 101 101 102  CO2 '27 25 23  ' GLUCOSE 91 83 92  BUN 22* 18 19  CREATININE 1.07 0.97 1.05  CALCIUM 8.9 8.7* 8.4*  MG  --  1.7  --   AST '26 23 26  ' ALT 36  32 37  ALKPHOS 48 43 43  BILITOT 0.8 0.7 0.6   ------------------------------------------------------------------------------------------------------------------ Recent Labs    10/23/19 0632  CHOL 123  HDL 30*  LDLCALC 74  TRIG 93  CHOLHDL 4.1    Lab Results  Component Value Date   HGBA1C 6.1 (H) 10/06/2013   ------------------------------------------------------------------------------------------------------------------ Recent Labs    10/23/19 0632  TSH 1.777   ------------------------------------------------------------------------------------------------------------------ No results for input(s): VITAMINB12, FOLATE, FERRITIN, TIBC, IRON, RETICCTPCT in the last 72 hours.  Coagulation profile Recent Labs  Lab 10/20/19 2021  10/21/19 0421  INR 1.0 1.1    No results for input(s): DDIMER in the last 72 hours.  Cardiac Enzymes No results for input(s): CKMB, TROPONINI, MYOGLOBIN in the last 168 hours.  Invalid input(s): CK ------------------------------------------------------------------------------------------------------------------    Component Value Date/Time   BNP 76.0 10/20/2019 2021    Roxan Hockey M.D on 10/25/2019 at 6:04 PM  Go to [email or web address] - for contact info  [facility] - Office  [phone]

## 2019-10-25 NOTE — Consult Note (Signed)
Reason for Consult: Fever of unknown etiology, rash Referring Physician: Dr. Lodema Pilot is an 59 y.o. male.  HPI: Patient is a 59 year old white male who was referred to my care for a skin biopsy of a rash.  He has a fever of unknown etiology and the hospitalist has requested a punch biopsy of one of the lesions.  Past Medical History:  Diagnosis Date  . Allergy   . Arthritis   . Asthma   . Coronary artery disease 2016   90% RCA and 30% LM S/P PCI of RCA  . GERD (gastroesophageal reflux disease)   . Headache    stress and tension  . History of nuclear stress test    Myoview 1/19: EF 61, no ischemia, low risk  . Hyperlipidemia   . Hypertension   . Neuromuscular disorder (HCC)    restless legs and leg cramps  . Rheumatoid arthritis ([place]) 06/2019  . Shortness of breath    Starting May 2015  . Tubular adenoma of colon 03/2013    Past Surgical History:  Procedure Laterality Date  . CHEST TUBE INSERTION Left 02/01/2014   Procedure: INSERTION PLEURAL DRAINAGE CATHETER;  Surgeon: Grace Isaac, MD;  Location: [place];  Service: Thoracic;  Laterality: Left;  . COLONOSCOPY    . COLONOSCOPY W/ BIOPSIES AND POLYPECTOMY  2014   benign  . LEFT HEART CATHETERIZATION WITH CORONARY ANGIOGRAM N/A 06/07/2014   Procedure: LEFT HEART CATHETERIZATION WITH CORONARY ANGIOGRAM;  Surgeon: Peter M Martinique, MD;  Location: [facility] CATH LAB;  Service: Cardiovascular;  Laterality: N/A;  . LEFT HEART CATHETERIZATION WITH CORONARY ANGIOGRAM N/A 06/08/2014   Procedure: LEFT HEART CATHETERIZATION WITH CORONARY ANGIOGRAM;  Surgeon: Sinclair Grooms, MD;  Location: [facility] CATH LAB;  Service: Cardiovascular;  Laterality: N/A;  . no prior surgery    . PLEURAL BIOPSY Left 02/01/2014   Procedure: PLEURAL BIOPSY;  Surgeon: Grace Isaac, MD;  Location: [place];  Service: Thoracic;  Laterality: Left;  . PLEURAL EFFUSION DRAINAGE Left 02/01/2014   Procedure: DRAINAGE OF PLEURAL EFFUSION;  Surgeon: Grace Isaac, MD;  Location: [place];  Service: Thoracic;  Laterality: Left;  . REMOVAL OF PLEURAL DRAINAGE CATHETER Left 02/16/2014   Procedure: REMOVAL OF PLEURAL DRAINAGE CATHETER;  Surgeon: Grace Isaac, MD;  Location: [place];  Service: Thoracic;  Laterality: Left;  . TALC PLEURODESIS Left 02/01/2014   Procedure: Pietro Cassis;  Surgeon: Grace Isaac, MD;  Location: [place];  Service: Thoracic;  Laterality: Left;  . THORACENTESIS Left 2015  . VIDEO ASSISTED THORACOSCOPY Left 02/01/2014   Procedure: VIDEO ASSISTED THORACOSCOPY;  Surgeon: Grace Isaac, MD;  Location: [place];  Service: Thoracic;  Laterality: Left;  Marland Kitchen VIDEO BRONCHOSCOPY N/A 02/01/2014   Procedure: VIDEO BRONCHOSCOPY;  Surgeon: Grace Isaac, MD;  Location: [facility] OR;  Service: Thoracic;  Laterality: N/A;    Family History  Adopted: Yes  Problem Relation Age of Onset  . Colon cancer Neg Hx   . Esophageal cancer Neg Hx   . Rectal cancer Neg Hx   . Stomach cancer Neg Hx     Social History:  reports that he quit smoking about 19 years ago. His smoking use included cigarettes. He has a 30.00 pack-year smoking history. He has never used smokeless tobacco. He reports current alcohol use. He reports that he does not use drugs.  Allergies: No Known Allergies  Medications: I have reviewed the patient's current medications.  Results for orders  placed or performed during the hospital encounter of 10/20/19 (from the past 48 hour(s))  C-reactive protein     Status: Abnormal   Collection Time: 10/24/19  6:08 AM  Result Value Ref Range   CRP 13.9 (H) <1.0 mg/dL    Comment: Performed at [facility], [address], [place], [place] [address]  Sedimentation rate     Status: Abnormal   Collection Time: 10/24/19  6:08 AM  Result Value Ref Range   Sed Rate 85 (H) 0 - 16 mm/hr    Comment: Performed at [facility], [address] [place], [place] [address]  Vancomycin, trough     Status: Abnormal   Collection Time: 10/24/19   6:08 AM  Result Value Ref Range   Vancomycin Tr 12 (L) 15 - 20 ug/mL    Comment: Performed at [facility], [place] [address] [place], [place] [address]    No results found.  ROS:  Pertinent items are noted in HPI.  Blood pressure 130/73, pulse 82, temperature 98.8 F (37.1 C), temperature source Oral, resp. rate 18, height 5\' 10"  (1.778 m), weight 135 kg, SpO2 98 %. Physical Exam: Pleasant white male no acute distress Head is normocephalic, atraumatic Lungs clear auscultation with equal breath sounds bilaterally Heart examination reveals a regular rate and rhythm without S3, S4, murmurs Skin examination reveals a diffuse erythematous rash on both lower extremities and upper chest.  Assessment/Plan: Impression: Fever of unknown etiology, rash Plan: Patient will undergo skin biopsy of one of the skin lesions today.  The risks and benefits of the procedure were fully explained to the patient, who gave informed consent.  Aviva Signs 10/25/2019, 7:08 AM

## 2019-10-25 NOTE — Op Note (Signed)
Patient:  Colin Lee  DOB:  [birth year]  MRN:  [id number]   Preop Diagnosis: Fever of unknown etiology, rash  Postop Diagnosis: Same  Procedure: Punch biopsy of skin x3  Surgeon: Aviva Signs, MD  Anes: Local  Indications: Patient is a 59 year old white male who has a fever of unknown etiology and rash.  I have been asked to do skin punch biopsies of multiple areas for diagnostic purposes.  The risks and benefits of the procedure were fully explained to the patient, who gave informed consent.  Procedure note: The patient was placed in supine position.  This was done in the minor procedure room.  1% Xylocaine was used for local anesthesia.  The right calf, right upper chest, and left arm were prepped using ChloraPrep.  Surgical site confirmation was performed.  A 3 to 4 mm punch biopsy was performed to the subcutaneous tissue in the right calf, right upper chest, and left arm regions.  All 3 biopsies were sent to pathology further examination.  A bleeding was controlled using silver nitrate sticks.  Band-Aids were then applied.  The patient tolerated the procedure well.  Complications: None  EBL: Minimal  Specimen: Punch biopsy of skin, right calf, right upper chest, left arm

## 2019-10-25 NOTE — Interval H&P Note (Signed)
History and Physical Interval Note:  10/25/2019 9:38 AM  Colin Lee  has presented today for surgery, with the diagnosis of fever of unknown etiology.  The various methods of treatment have been discussed with the patient and family. After consideration of risks, benefits and other options for treatment, the patient has consented to  Procedure(s): SKIN BIOPSY (N/A) as a surgical intervention.  The patient's history has been reviewed, patient examined, no change in status, stable for surgery.  I have reviewed the patient's chart and labs.  Questions were answered to the patient's satisfaction.     Aviva Signs

## 2019-10-26 ENCOUNTER — Encounter (HOSPITAL_COMMUNITY): Payer: Self-pay | Admitting: General Surgery

## 2019-10-26 LAB — CBC
HCT: 36.7 % — ABNORMAL LOW (ref 39.0–52.0)
Hemoglobin: 12.1 g/dL — ABNORMAL LOW (ref 13.0–17.0)
MCH: 28.4 pg (ref 26.0–34.0)
MCHC: 33 g/dL (ref 30.0–36.0)
MCV: 86.2 fL (ref 80.0–100.0)
Platelets: 220 10*3/uL (ref 150–400)
RBC: 4.26 MIL/uL (ref 4.22–5.81)
RDW: 13.7 % (ref 11.5–15.5)
WBC: 6.6 10*3/uL (ref 4.0–10.5)
nRBC: 0 % (ref 0.0–0.2)

## 2019-10-26 LAB — COMPREHENSIVE METABOLIC PANEL
ALT: 53 U/L — ABNORMAL HIGH (ref 0–44)
AST: 26 U/L (ref 15–41)
Albumin: 3.2 g/dL — ABNORMAL LOW (ref 3.5–5.0)
Alkaline Phosphatase: 41 U/L (ref 38–126)
Anion gap: 9 (ref 5–15)
BUN: 12 mg/dL (ref 6–20)
CO2: 25 mmol/L (ref 22–32)
Calcium: 8.9 mg/dL (ref 8.9–10.3)
Chloride: 105 mmol/L (ref 98–111)
Creatinine, Ser: 0.89 mg/dL (ref 0.61–1.24)
GFR calc Af Amer: >60 mL/min (ref >60)
GFR calc non Af Amer: >60 mL/min (ref >60)
Glucose, Bld: 68 mg/dL — ABNORMAL LOW (ref 70–99)
Potassium: 4 mmol/L (ref 3.5–5.1)
Sodium: 139 mmol/L (ref 135–145)
Total Bilirubin: 0.5 mg/dL (ref 0.3–1.2)
Total Protein: 6.9 g/dL (ref 6.5–8.1)

## 2019-10-26 LAB — B. BURGDORFI ANTIBODIES: B burgdorferi Ab IgG+IgM: <0.91 {ISR} (ref 0.00–0.90)

## 2019-10-26 MED ORDER — ASPIRIN EC 81 MG PO TBEC: 81.0000 mg | DELAYED_RELEASE_TABLET | Freq: Every day | ORAL | 11 refills | Status: DC

## 2019-10-26 MED ORDER — DOXYCYCLINE HYCLATE 100 MG PO TABS: 100.0000 mg | ORAL_TABLET | Freq: Two times a day (BID) | ORAL | 0 refills | Status: AC

## 2019-10-26 MED ORDER — LOSARTAN POTASSIUM 50 MG PO TABS: 50.0000 mg | ORAL_TABLET | Freq: Every day | ORAL | 3 refills | Status: DC

## 2019-10-26 NOTE — Progress Notes (Signed)
  October 26, 2019  Patient: Colin Lee  Date of Birth: 1960-05-29  Date of Visit: 10/20/2019    To Whom It May Concern:  Mclain Freer was admitted to [facility] on the medical-surgical unit  10/20/2019.  may return to work on Thursday, October 28, 2019.  Sincerely,   Roxan Hockey, MD

## 2019-10-26 NOTE — Discharge Summary (Signed)
Colin Lee, is a 59 y.o. male  DOB 28-Feb-1961  MRN [id number].  Admission date:  10/20/2019  Admitting Physician  Jamisha Hoeschen [place] Brick, MD  Discharge Date:  10/26/2019   Primary MD  [name], MD  Recommendations for primary care physician for things to follow:   1) you are taking aspirin and Plavix so please Avoid ibuprofen/Advil/Aleve/Motrin/Goody Powders/Naproxen/BC powders/Meloxicam/Diclofenac/Indomethacin and other Nonsteroidal anti-inflammatory medications as these will make you more likely to bleed and can cause stomach ulcers, can also cause Kidney problems.   2) please follow-up with infectious disease specialist Dr. Talbot Grumbling--- within a week for reevaluation and to review the final blood work results---due to your Persistent Rash and Fevers with negative cultures so far  3) okay to take Tylenol as needed for fevers   4) please decrease losartan to 50 mg daily for now  Admission Diagnosis  Elevated troponin [R77.8] Fever [R50.9] Acute febrile illness [R50.9] Sepsis without acute organ dysfunction, due to unspecified organism [facility]) [A41.9] Bacteremia [R78.81]   Discharge Diagnosis  Elevated troponin [R77.8] Fever [R50.9] Acute febrile illness [R50.9] Sepsis without acute organ dysfunction, due to unspecified organism [facility]) [A41.9] Bacteremia [R78.81]    Principal Problem:   Bacteremia due to Gram-positive bacteria Active Problems:   Cellulitis and abscess of right leg with Bacteremia   CAD (coronary artery disease)   Morbid obesity due to excess calories (HCC)   Hypertension   Elevated troponin   Dyslipidemia   Acute febrile illness   GERD (gastroesophageal reflux disease)   COPD (chronic obstructive pulmonary disease) (HCC)   Bacteremia   Rash and nonspecific skin eruption      Past Medical History:  Diagnosis Date  . Allergy   . Arthritis   . Asthma   . Coronary  artery disease 2016   90% RCA and 30% LM S/P PCI of RCA  . GERD (gastroesophageal reflux disease)   . Headache    stress and tension  . History of nuclear stress test    Myoview 1/19: EF 61, no ischemia, low risk  . Hyperlipidemia   . Hypertension   . Neuromuscular disorder (HCC)    restless legs and leg cramps  . Rheumatoid arthritis ([place]) 06/2019  . Shortness of breath    Starting May 2015  . Tubular adenoma of colon 03/2013    Past Surgical History:  Procedure Laterality Date  . CHEST TUBE INSERTION Left 02/01/2014   Procedure: INSERTION PLEURAL DRAINAGE CATHETER;  Surgeon: Grace Isaac, MD;  Location: [place];  Service: Thoracic;  Laterality: Left;  . COLONOSCOPY    . COLONOSCOPY W/ BIOPSIES AND POLYPECTOMY  2014   benign  . LEFT HEART CATHETERIZATION WITH CORONARY ANGIOGRAM N/A 06/07/2014   Procedure: LEFT HEART CATHETERIZATION WITH CORONARY ANGIOGRAM;  Surgeon: Peter M Martinique, MD;  Location: [facility] CATH LAB;  Service: Cardiovascular;  Laterality: N/A;  . LEFT HEART CATHETERIZATION WITH CORONARY ANGIOGRAM N/A 06/08/2014   Procedure: LEFT HEART CATHETERIZATION WITH CORONARY ANGIOGRAM;  Surgeon: Sinclair Grooms, MD;  Location:  [place] CATH LAB;  Service: Cardiovascular;  Laterality: N/A;  . MASS EXCISION N/A 10/25/2019   Procedure: SKIN BIOPSY;  Surgeon: Aviva Signs, MD;  Location: [facility];  Service: General;  Laterality: N/A;  . no prior surgery    . PLEURAL BIOPSY Left 02/01/2014   Procedure: PLEURAL BIOPSY;  Surgeon: Grace Isaac, MD;  Location: [facility];  Service: Thoracic;  Laterality: Left;  . PLEURAL EFFUSION DRAINAGE Left 02/01/2014   Procedure: DRAINAGE OF PLEURAL EFFUSION;  Surgeon: Grace Isaac, MD;  Location: [facility];  Service: Thoracic;  Laterality: Left;  . REMOVAL OF PLEURAL DRAINAGE CATHETER Left 02/16/2014   Procedure: REMOVAL OF PLEURAL DRAINAGE CATHETER;  Surgeon: Grace Isaac, MD;  Location: [place];  Service: Thoracic;  Laterality: Left;  . TALC  PLEURODESIS Left 02/01/2014   Procedure: Pietro Cassis;  Surgeon: Grace Isaac, MD;  Location: [place];  Service: Thoracic;  Laterality: Left;  . THORACENTESIS Left 2015  . VIDEO ASSISTED THORACOSCOPY Left 02/01/2014   Procedure: VIDEO ASSISTED THORACOSCOPY;  Surgeon: Grace Isaac, MD;  Location: [place];  Service: Thoracic;  Laterality: Left;  Marland Kitchen VIDEO BRONCHOSCOPY N/A 02/01/2014   Procedure: VIDEO BRONCHOSCOPY;  Surgeon: Grace Isaac, MD;  [facility];  Service: Thoracic;  Laterality: N/A;     HPI  from the history and physical done on the day of admission:    Chief Complaint: Fever  HPI: Colin Lee is a 59 y.o. male with medical history significant for hypertension, hyperlipidemia, CAD, GERD, COPD and morbid obesity who presents to the emergency department due to 2 to 3 days of increased fatigue.  Patient states that on returning from work today, he had shaking chills, fever with a temperature of 106F and a midsternal chest pain that was described as soreness and associated with shortness of breath.  Chest pain was nonradiating and was rated to be of moderate intensity with no alleviating/aggravating factors.  Patient states that he just came back from the beach after 2 to 3 days, he noted bilateral rash (sunburn) in distal thigh area (R > L), but he also noted a newly developed linear erythematous rash in RLE (nonpruritic with no knowledge of contact with poison oak/ivy, insect bite). Patient denies nausea, vomiting, abdominal pain or any known sick contact.  ED Course:  In the emergency department, temperature on arrival was 102.80F, he was tachycardic and tachypneic. Work-up in the ED showed normal CBC and BMP, SARS coronavirus 2 was negative, troponin x24, urinalysis was negative. CT negative chest showed no evidence of pulmonary embolus but showed emphysema with basilar predominant areas of scarring and fibrosis. Chest x-ray showed no acute cardiopulmonary  abnormality. He was empirically started on IV cefepime, metronidazole and vancomycin. Hospitalist was asked to admit patient for further evaluation management.     Hospital Course:     Brief Summary:- 59 y.o.malewith medical history significant for HTN, HLD, CAD, GERD, COPD and Morbid Obesity admitted on 10/20/2019 with SIRs-of unknown etiology -Fever curve improving  A/p  1) acute febrile illness --fever curve improving  -- blood cultures from 10/20/2019 with staph hominis / CONS --suspect contaminant,  -Repeat blood cultures from  10/22/19 NGTD -Patient with  fevers, tachycardia, tachypnea and right lower extremity cellulitis,no leukocytosis despite steroid therapy ----patient meets SIRS criteria, patient has no hypoxia, no AKI, no metabolic encephalopathy, no lactic acidosis and no other evidence of endorgan dysfunction--patient does NOT meet criteria for sepsis at this time -CRP 17.4 >>13.9, ESR 3>>85,  -  Procalcitonin < 0.10>> 0.13 -Hold off on echocardiogram given negative blood cultures 7/14 Ucx: NGF -Lyme titers and [place] spotted fever titers pending Discussed with Dr Michel Bickers from infectious disease --- differential diagnosis includes possible hypersensitivity reaction--requested that Dr. Arnoldo Morale and general surgeon try to do a punch biopsy of one of the newer skin eruptions for diagnostic purposes -Punch biopsy of skin, right calf, right upper chest, left arm (punch biopsy x 3) obtained on 10/25/2019 -Discontinued Vanco and Zosyn for now and started doxycycline on 10/24/19- just in case patient has tickborne infection--- pending further lab data -Discharge home on p.o. doxycycline pending results of Lyme and [place] spotted fever labs   2)Right lower extremity cellulitis--patient just returned from a trip to the beach---please see pictures in epic -  lower extremity Dopplers without acute DVT -Antibiotics as above in #1 -Punch biopsy as above in #1  3)  COPD/emphysema--no acute flareup bronchodilators as ordered -CTA chest without acute findings  4)CAD--history of prior angioplasty and stent placement--no ACS type symptoms at this time, initial troponin is 24, repeat troponin is 24 -Continue aspirin, Plavix, metoprolol and Lipitor  5)RA--- longstanding history of rheumatoid arthritis, PTA patient was on chloroquine and prednisone -Treated with prednisone at 20 mg daily,  -Okay to discharge back on preadmission dose, PTA patient was on 10 mg daily  6)Morbid Obesity- -Low calorie diet, portion control and increase physical activity discussed with patient -Body mass index is 42.72 kg/m.  Disposition--- discharge home with outpatient follow-up with infectious disease specialist if fevers persist  -At the time of discharge- Lyme and [facility] spotted fever test was still pending, results/pathology from punch biopsies of the skin were still pending  Code Status : full  Procedures:- Punch biopsy of skin, right calf, right upper chest, left arm  Family Communication:    (patient is alert, awake and coherent Discussed with wife at bedside Consults  : Dr. Michel Bickers from infectious disease -Dr. Arnoldo Morale from general surgery for punch biopsies  Discharge Condition: stable  Follow UP   Follow-up Information    Comer, Okey Regal, MD. Schedule an appointment as soon as possible for a visit in 1 week(s).   Specialty: Infectious Diseases Why: Persistent Rash and Fevers with negative cultures Contact information: [address] [address] [phone]               Diet and Activity recommendation:  As advised  Discharge Instructions   ` Discharge Instructions    Call MD for:  difficulty breathing, headache or visual disturbances   Complete by: As directed    Call MD for:  persistant dizziness or light-headedness   Complete by: As directed    Call MD for:  persistant nausea and vomiting    Complete by: As directed    Call MD for:  severe uncontrolled pain   Complete by: As directed    Call MD for:  temperature >100.4   Complete by: As directed    Diet - low sodium heart healthy   Complete by: As directed    Discharge instructions   Complete by: As directed    1) you are taking aspirin and Plavix so please Avoid ibuprofen/Advil/Aleve/Motrin/Goody Powders/Naproxen/BC powders/Meloxicam/Diclofenac/Indomethacin and other Nonsteroidal anti-inflammatory medications as these will make you more likely to bleed and can cause stomach ulcers, can also cause Kidney problems.   2) please follow-up with infectious disease specialist Dr. Talbot Grumbling--- within a week for reevaluation and to review the  final blood work results---due to your Persistent Rash and Fevers with negative cultures so far  3) okay to take Tylenol as needed for fevers   4) please decrease losartan to 50 mg daily for now   Increase activity slowly   Complete by: As directed         Discharge Medications     Allergies as of 10/26/2019   No Known Allergies     Medication List    TAKE these medications   acetaminophen 500 MG tablet Commonly known as: TYLENOL Take 1,000 mg by mouth every 6 (six) hours as needed for moderate pain.   aspirin EC 81 MG tablet Take 1 tablet (81 mg total) by mouth daily with breakfast. What changed: when to take this   atorvastatin 40 MG tablet Commonly known as: LIPITOR One half daily What changed:   how much to take  how to take this  when to take this  additional instructions   CENTRUM ADULTS PO Take 1 tablet by mouth daily.   clopidogrel 75 MG tablet Commonly known as: PLAVIX TAKE 1 TABLET BY MOUTH ONCE DAILY. What changed: when to take this   doxycycline 100 MG tablet Commonly known as: VIBRA-TABS Take 1 tablet (100 mg total) by mouth 2 (two) times daily for 7 days.   famotidine 20 MG tablet Commonly known as: Pepcid One after supper What changed:    how much to take  how to take this  when to take this  additional instructions   Fish Oil 1000 MG Cpdr Take 4,000 mg by mouth daily. What changed:   how much to take  when to take this   losartan 50 MG tablet Commonly known as: COZAAR Take 1 tablet (50 mg total) by mouth daily. What changed: See the new instructions.   metoprolol tartrate 25 MG tablet Commonly known as: LOPRESSOR Take 0.5 tablets (12.5 mg total) by mouth 2 (two) times daily.   nitroGLYCERIN 0.4 MG SL tablet Commonly known as: NITROSTAT Place 1 tablet (0.4 mg total) under the tongue every 5 (five) minutes as needed for chest pain.   pantoprazole 40 MG tablet Commonly known as: PROTONIX TAKE 1 TABLET BY MOUTH ONCE DAILY. What changed: when to take this   predniSONE 10 MG tablet Commonly known as: DELTASONE Take 10 mg by mouth daily.       Major procedures and Radiology Reports - PLEASE review detailed and final reports for all details, in brief - CT Angio Chest PE W and/or Wo Contrast  Result Date: 10/20/2019 CLINICAL DATA:  Shortness of breath, weakness, fever EXAM: CT ANGIOGRAPHY CHEST WITH CONTRAST TECHNIQUE: Multidetector CT imaging of the chest was performed using the standard protocol during bolus administration of intravenous contrast. Multiplanar CT image reconstructions and MIPs were obtained to evaluate the vascular anatomy. CONTRAST:  152m OMNIPAQUE IOHEXOL 350 MG/ML SOLN COMPARISON:  06/02/2014, 10/20/2019 FINDINGS: Cardiovascular: This is a technically adequate evaluation of the pulmonary vasculature. No acute filling defects or pulmonary emboli. The heart and great vessels are unremarkable without pericardial effusion. Mild atherosclerosis of the aortic arch. Mediastinum/Nodes: No enlarged mediastinal, hilar, or axillary lymph nodes. Thyroid gland, trachea, and esophagus demonstrate no significant findings. Lungs/Pleura: Chronic areas of scarring and fibrosis are seen throughout the lungs,  greatest in the left lower lobe. No acute airspace disease, effusion, or pneumothorax. Bullous emphysematous changes are seen at the lung apices. Upper Abdomen: Calcified gallstones are identified without cholecystitis. No other acute upper abdominal findings. Musculoskeletal: No acute  or destructive bony lesions. Reconstructed images demonstrate no additional findings. Review of the MIP images confirms the above findings. IMPRESSION: 1. No evidence of pulmonary embolus. 2. Emphysema, with basilar predominant areas of scarring and fibrosis. 3. Cholelithiasis without evidence of cholecystitis. 4. Aortic Atherosclerosis (ICD10-I70.0) and Emphysema (ICD10-J43.9). Electronically Signed   By: Randa Ngo M.D.   On: 10/20/2019 22:34   US Venous Img Lower Bilateral (DVT)  Result Date: 10/22/2019 CLINICAL DATA:  Bilateral lower extremity pain and edema. History of pulmonary embolism. Evaluate for DVT. EXAM: BILATERAL LOWER EXTREMITY VENOUS DOPPLER ULTRASOUND TECHNIQUE: Gray-scale sonography with graded compression, as well as color Doppler and duplex ultrasound were performed to evaluate the lower extremity deep venous systems from the level of the common femoral vein and including the common femoral, femoral, profunda femoral, popliteal and calf veins including the posterior tibial, peroneal and gastrocnemius veins when visible. The superficial great saphenous vein was also interrogated. Spectral Doppler was utilized to evaluate flow at rest and with distal augmentation maneuvers in the common femoral, femoral and popliteal veins. COMPARISON:  None. FINDINGS: RIGHT LOWER EXTREMITY Common Femoral Vein: No evidence of thrombus. Normal compressibility, respiratory phasicity and response to augmentation. Saphenofemoral Junction: No evidence of thrombus. Normal compressibility and flow on color Doppler imaging. Profunda Femoral Vein: No evidence of thrombus. Normal compressibility and flow on color Doppler imaging.  Femoral Vein: No evidence of thrombus. Normal compressibility, respiratory phasicity and response to augmentation. Popliteal Vein: No evidence of thrombus. Normal compressibility, respiratory phasicity and response to augmentation. Calf Veins: No evidence of thrombus. Normal compressibility and flow on color Doppler imaging. Superficial Great Saphenous Vein: No evidence of thrombus. Normal compressibility. Venous Reflux:  None. Other Findings:  None. LEFT LOWER EXTREMITY Common Femoral Vein: No evidence of thrombus. Normal compressibility, respiratory phasicity and response to augmentation. Saphenofemoral Junction: No evidence of thrombus. Normal compressibility and flow on color Doppler imaging. Profunda Femoral Vein: No evidence of thrombus. Normal compressibility and flow on color Doppler imaging. Femoral Vein: No evidence of thrombus. Normal compressibility, respiratory phasicity and response to augmentation. Popliteal Vein: No evidence of thrombus. Normal compressibility, respiratory phasicity and response to augmentation. Calf Veins: No evidence of thrombus. Normal compressibility and flow on color Doppler imaging. Superficial Great Saphenous Vein: No evidence of thrombus. Normal compressibility. Venous Reflux:  None. Other Findings:  None. IMPRESSION: No evidence of DVT within either lower extremity. Electronically Signed   By: Sandi Mariscal M.D.   On: 10/22/2019 14:19   DG Chest Port 1 View  Result Date: 10/20/2019 CLINICAL DATA:  59 year old male with possible sepsis. Testing for COVID-19 pending. EXAM: PORTABLE CHEST 1 VIEW COMPARISON:  Chest radiographs 03/29/2019 and earlier. FINDINGS: Portable AP upright view at 2057 hours. Lung volumes and mediastinal contours are stable. Visualized tracheal air column is within normal limits. Mild chronic interstitial markings appear stable. Otherwise allowing for portable technique the lungs are clear. No acute osseous abnormality identified. IMPRESSION: No acute  cardiopulmonary abnormality. Electronically Signed   By: Genevie Ann M.D.   On: 10/20/2019 21:12    Micro Results   Recent Results (from the past 240 hour(s))  SARS Coronavirus 2 by RT PCR (hospital order, performed in [facility] hospital lab) Nasopharyngeal Nasopharyngeal Swab     Status: None   Collection Time: 10/20/19  8:21 PM   Specimen: Nasopharyngeal Swab  Result Value Ref Range Status   SARS Coronavirus 2 NEGATIVE NEGATIVE Final    Comment: (NOTE) SARS-CoV-2 target nucleic acids are NOT DETECTED.  The SARS-CoV-2 RNA is generally detectable in upper and lower respiratory specimens during the acute phase of infection. The lowest concentration of SARS-CoV-2 viral copies this assay can detect is 250 copies / mL. A negative result does not preclude SARS-CoV-2 infection and should not be used as the sole basis for treatment or other patient management decisions.  A negative result may occur with improper specimen collection / handling, submission of specimen other than nasopharyngeal swab, presence of viral mutation(s) within the areas targeted by this assay, and inadequate number of viral copies (<250 copies / mL). A negative result must be combined with clinical observations, patient history, and epidemiological information.  Fact Sheet for Patients:   StrictlyIdeas.no  Fact Sheet for Healthcare Providers: BankingDealers.co.za  This test is not yet approved or  cleared by the [place] FDA and has been authorized for detection and/or diagnosis of SARS-CoV-2 by FDA under an Emergency Use Authorization (EUA).  This EUA will remain in effect (meaning this test can be used) for the duration of the COVID-19 declaration under Section 564(b)(1) of the Act, 21 U.S.C. section 360bbb-3(b)(1), unless the authorization is terminated or revoked sooner.  Performed at [facility], [address] [place], [place] [address]   Blood Culture  (routine x 2)     Status: Abnormal   Collection Time: 10/20/19  8:21 PM   Specimen: BLOOD  Result Value Ref Range Status   Specimen Description   Final    BLOOD RIGHT ANTECUBITAL Performed at [facility] [address], [place], [place] [address]    Special Requests   Final    BOTTLES DRAWN AEROBIC AND ANAEROBIC Blood Culture adequate volume Performed at [facility], [address], [place], [place] [address]    Culture  Setup Time   Final    IN BOTH AEROBIC AND ANAEROBIC BOTTLES GRAM POSITIVE COCCI Gram Stain Report Called to,Read Back By and Verified With: DANIEL MARTIN 10/21/19 '@[id number]'  BY TJ Organism ID to follow CRITICAL RESULT CALLED TO, READ BACK BY AND VERIFIED WITH: Verlene Mayer RN '@[id number]'  10/21/19 EB    Culture (A)  Final    STAPHYLOCOCCUS HOMINIS THE SIGNIFICANCE OF ISOLATING THIS ORGANISM FROM A SINGLE SET OF BLOOD CULTURES WHEN MULTIPLE SETS ARE DRAWN IS UNCERTAIN. PLEASE NOTIFY THE MICROBIOLOGY DEPARTMENT WITHIN ONE WEEK IF SPECIATION AND SENSITIVITIES ARE REQUIRED. Performed at [facility], [place] [address] [place], [place] [address]    Report Status 10/23/2019 FINAL  Final  Blood Culture ID Panel (Reflexed)     Status: Abnormal   Collection Time: 10/20/19  8:21 PM  Result Value Ref Range Status   Enterococcus species NOT DETECTED NOT DETECTED Final   Listeria monocytogenes NOT DETECTED NOT DETECTED Final   Staphylococcus species DETECTED (A) NOT DETECTED Final    Comment: Methicillin (oxacillin) susceptible coagulase negative staphylococcus. Possible blood culture contaminant (unless isolated from more than one blood culture draw or clinical case suggests pathogenicity). No antibiotic treatment is indicated for blood  culture contaminants. CRITICAL RESULT CALLED TO, READ BACK BY AND VERIFIED WITH: K,THOMAS RN '@[id number]'  10/21/19 EB    Staphylococcus aureus (BCID) NOT DETECTED NOT DETECTED Final   Methicillin resistance NOT DETECTED NOT DETECTED Final   Streptococcus  species NOT DETECTED NOT DETECTED Final   Streptococcus agalactiae NOT DETECTED NOT DETECTED Final   Streptococcus pneumoniae NOT DETECTED NOT DETECTED Final   Streptococcus pyogenes NOT DETECTED NOT DETECTED Final   Acinetobacter baumannii NOT DETECTED NOT DETECTED Final   Enterobacteriaceae  species NOT DETECTED NOT DETECTED Final   Enterobacter cloacae complex NOT DETECTED NOT DETECTED Final   Escherichia coli NOT DETECTED NOT DETECTED Final   Klebsiella oxytoca NOT DETECTED NOT DETECTED Final   Klebsiella pneumoniae NOT DETECTED NOT DETECTED Final   Proteus species NOT DETECTED NOT DETECTED Final   Serratia marcescens NOT DETECTED NOT DETECTED Final   Haemophilus influenzae NOT DETECTED NOT DETECTED Final   Neisseria meningitidis NOT DETECTED NOT DETECTED Final   Pseudomonas aeruginosa NOT DETECTED NOT DETECTED Final   Candida albicans NOT DETECTED NOT DETECTED Final   Candida glabrata NOT DETECTED NOT DETECTED Final   Candida krusei NOT DETECTED NOT DETECTED Final   Candida parapsilosis NOT DETECTED NOT DETECTED Final   Candida tropicalis NOT DETECTED NOT DETECTED Final    Comment: Performed at [facility], [place] [address], [place], [place] [address]  Urine culture     Status: None   Collection Time: 10/20/19  8:46 PM   Specimen: In/Out Cath Urine  Result Value Ref Range Status   Specimen Description   Final    IN/OUT CATH URINE Performed at [facility], [address] [place], [place] [address]    Special Requests   Final    NONE Performed at [facility], [address], [place], [place] [address]    Culture   Final    NO GROWTH Performed at [facility], [place] [address] [place], [place] [address]    Report Status 10/22/2019 FINAL  Final  Blood Culture (routine x 2)     Status: None   Collection Time: 10/20/19  9:04 PM   Specimen: BLOOD RIGHT ARM  Result Value Ref Range Status   Specimen Description BLOOD RIGHT ARM  Final   Special Requests   Final     BOTTLES DRAWN AEROBIC AND ANAEROBIC Blood Culture adequate volume   Culture   Final    NO GROWTH 5 DAYS Performed at [facility], [address] [place], [place] [address]    Report Status 10/25/2019 FINAL  Final  Culture, blood (Routine X 2) w Reflex to ID Panel     Status: None (Preliminary result)   Collection Time: 10/22/19  8:53 AM   Specimen: BLOOD LEFT HAND  Result Value Ref Range Status   Specimen Description BLOOD LEFT HAND  Final   Special Requests   Final    BOTTLES DRAWN AEROBIC AND ANAEROBIC Blood Culture adequate volume   Culture   Final    NO GROWTH 4 DAYS Performed at [facility], [address], [place], [place] [address]    Report Status PENDING  Incomplete  Culture, blood (Routine X 2) w Reflex to ID Panel     Status: None (Preliminary result)   Collection Time: 10/22/19  8:53 AM   Specimen: BLOOD RIGHT HAND  Result Value Ref Range Status   Specimen Description BLOOD RIGHT HAND  Final   Special Requests   Final    BOTTLES DRAWN AEROBIC AND ANAEROBIC Blood Culture adequate volume   Culture   Final    NO GROWTH 4 DAYS Performed at [facility], [address], [place],  [address]    Report Status PENDING  Incomplete       Today   Subjective    Colin Lee today has no new concerns  No Nausea, Vomiting or Diarrhea         Patient has been seen and examined prior to discharge   Objective  Blood pressure (!) 116/92, pulse 77, temperature 98.7 F (37.1 C), temperature source Oral, resp. rate 18, height '5\' 10"'  (1.778 m), weight 135 kg, SpO2 98 %.   Intake/Output Summary (Last 24 hours) at 10/26/2019 1519 Last data filed at 10/26/2019 0359 Gross per 24 hour  Intake 350 ml  Output --  Net 350 ml    Exam Gen:- Awake Alert, no acute distress , obese HEENT:- [place].AT, No sclera icterus Neck-Supple Neck,No JVD,.  Lungs-  CTAB , good air movement bilaterally  CV- S1, S2 normal, regular Abd-  +ve B.Sounds, Abd Soft, No tenderness,  increased truncal adiposity Extremity:- No  edema,   good pulses Psych-affect is appropriate, oriented x3 Neuro-no new focal deficits, no tremors  -New extensive blanchable macular rash over upper torso anteriorly and posteriorly as well as on lower extremities  -Please see photos in epic   Data Review   CBC w Diff:  Lab Results  Component Value Date   WBC 6.6 10/26/2019   HGB 12.1 (L) 10/26/2019   HCT 36.7 (L) 10/26/2019   PLT 220 10/26/2019   LYMPHOPCT 10 10/20/2019   MONOPCT 6 10/20/2019   EOSPCT 1 10/20/2019   BASOPCT 0 10/20/2019    CMP:  Lab Results  Component Value Date   NA 139 10/26/2019   NA 143 04/28/2017   K 4.0 10/26/2019   CL 105 10/26/2019   CO2 25 10/26/2019   BUN 12 10/26/2019   BUN 15 04/28/2017   CREATININE 0.89 10/26/2019   PROT 6.9 10/26/2019   ALBUMIN 3.2 (L) 10/26/2019   BILITOT 0.5 10/26/2019   ALKPHOS 41 10/26/2019   AST 26 10/26/2019   ALT 53 (H) 10/26/2019  .   Total Discharge time is about 33 minutes  Roxan Hockey M.D on 10/26/2019 at 3:19 PM  Go to [email or web address] -  for contact info  [facility] - Office  [phone]

## 2019-10-26 NOTE — Discharge Instructions (Signed)
1) you are taking aspirin and Plavix so please Avoid ibuprofen/Advil/Aleve/Motrin/Goody Powders/Naproxen/BC powders/Meloxicam/Diclofenac/Indomethacin and other Nonsteroidal anti-inflammatory medications as these will make you more likely to bleed and can cause stomach ulcers, can also cause Kidney problems.   2) please follow-up with infectious disease specialist Dr. Talbot Grumbling--- within a week for reevaluation and to review the final blood work results---due to your Persistent Rash and Fevers with negative cultures so far  3) okay to take Tylenol as needed for fevers   4) please decrease losartan to 50 mg daily for now

## 2019-10-26 NOTE — Progress Notes (Signed)
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 Notified Dr. Denton Brick, stated nursing could place work note and print for patient. Given at discharge.

## 2019-10-27 ENCOUNTER — Other Ambulatory Visit: Payer: Self-pay

## 2019-10-27 LAB — CULTURE, BLOOD (ROUTINE X 2)
Culture: NO GROWTH
Culture: NO GROWTH
Special Requests: ADEQUATE
Special Requests: ADEQUATE

## 2019-10-27 LAB — ROCKY MTN SPOTTED FVR ABS PNL(IGG+IGM)
RMSF IgG: UNDETERMINED
RMSF IgM: 0.44 index (ref 0.00–0.89)

## 2019-10-27 LAB — RMSF, IGG, IFA: RMSF, IGG, IFA: <1:64 {titer}

## 2019-10-28 LAB — SURGICAL PATHOLOGY

## 2019-10-29 LAB — LYME DISEASE, WESTERN BLOT
IgG P18 Ab.: ABSENT
IgG P23 Ab.: ABSENT
IgG P28 Ab.: ABSENT
IgG P30 Ab.: ABSENT
IgG P39 Ab.: ABSENT
IgG P41 Ab.: ABSENT
IgG P45 Ab.: ABSENT
IgG P58 Ab.: ABSENT
IgG P66 Ab.: ABSENT
IgG P93 Ab.: ABSENT
IgM P23 Ab.: ABSENT
IgM P39 Ab.: ABSENT
IgM P41 Ab.: ABSENT
Lyme IgG Wb: NEGATIVE
Lyme IgM Wb: NEGATIVE

## 2019-11-10 ENCOUNTER — Ambulatory Visit: Payer: BC Managed Care – PPO | Admitting: Internal Medicine

## 2019-11-10 ENCOUNTER — Other Ambulatory Visit: Payer: Self-pay

## 2019-11-10 ENCOUNTER — Encounter: Payer: Self-pay | Admitting: Internal Medicine

## 2019-11-10 VITALS — BP 149/85 | HR 72 | Wt 298.0 lb

## 2019-11-10 DIAGNOSIS — L512 Toxic epidermal necrolysis [Lyell]: Secondary | ICD-10-CM

## 2019-11-10 DIAGNOSIS — R21 Rash and other nonspecific skin eruption: Secondary | ICD-10-CM | POA: Diagnosis not present

## 2019-11-10 HISTORY — DX: Toxic epidermal necrolysis (lyell): L51.2

## 2019-11-10 MED ORDER — DOXYCYCLINE HYCLATE 100 MG PO TABS: 100.0000 mg | ORAL_TABLET | Freq: Two times a day (BID) | ORAL | 0 refills | Status: DC

## 2019-11-10 NOTE — Progress Notes (Signed)
[place] for Infectious Disease      Reason for Consult: fever and rash    Referring Physician: Dr. Denton Brick    Patient ID: Colin Lee, male    DOB: 1960-10-25, 59 y.o.   MRN: [id number]  HPI:   He is here after a recent hospitalization to follow up on etiology of recent febrile illness with rash.  [facility] was [facility] to [facility] on [facility]/14 with 2-3 days of fatigue then acute onset fever up to 106, chills and sob.  This was also associated with a rash that began on his lower extremities and then was noted and spreaad to his back, shoulders and torso.  He has a history of seronegative rheumatoid arthritis and has been on hydroxychloroquine for this that was sstarted about the month prior.  He really noted the rash after being in the sun [place] the beach.  He also has been on prednisone 20 mg daily and is on a slow taper by Dr. Posey Pronto of rheumatology.  During his hospitalization, he had a fever and SIRS like presentation but no obvious source of infection.  His blood culture did grow staph hominis and was considered a contaminate.  He had 3 punch biopsies done of his lesions and the histopathology was most c/w a drug reaction, though is not definitive.  He continues to have some rash and did note some worsening of his rash on both his hands after some sun exposure.  No further fever or chills.  Previous record reviewed in [place] and partially summarized above.    Past Medical History:  Diagnosis Date  . Allergy   . Arthritis   . Asthma   . Coronary artery disease 2016   90% RCA and 30% LM S/P PCI of RCA  . GERD (gastroesophageal reflux disease)   . Headache    stress and tension  . History of nuclear stress test    Myoview 1/19: EF 61, no ischemia, low risk  . Hyperlipidemia   . Hypertension   . Neuromuscular disorder (HCC)    restless legs and leg cramps  . Rheumatoid arthritis ([place]) 06/2019  . Shortness of breath    Starting May 2015  . Tubular adenoma of  colon 03/2013    Prior to Admission medications   Medication Sig Start Date End Date Taking? Authorizing Provider  acetaminophen (TYLENOL) 500 MG tablet Take 1,000 mg by mouth every 6 (six) hours as needed for moderate pain.   Yes [provider]  aspirin EC 81 MG tablet Take 1 tablet (81 mg total) by mouth daily with breakfast. 10/26/19  Yes Emokpae, Courage, MD  atorvastatin (LIPITOR) 40 MG tablet One half daily Patient taking differently: Take 20 mg by mouth in the morning.  03/29/19  Yes Tanda Rockers, MD  clopidogrel (PLAVIX) 75 MG tablet TAKE 1 TABLET BY MOUTH ONCE DAILY. Patient taking differently: Take 75 mg by mouth in the morning.  04/01/19  Yes Turner, Eber Hong, MD  famotidine (PEPCID) 20 MG tablet One after supper Patient taking differently: Take 29 mg by mouth at bedtime.  03/29/19  Yes Tanda Rockers, MD  hydroxychloroquine (PLAQUENIL) 200 MG tablet Take 200 mg by mouth 2 (two) times daily. 11/04/19  Yes [provider]  losartan (COZAAR) 50 MG tablet Take 1 tablet (50 mg total) by mouth daily. 10/26/19  Yes Emokpae, Courage, MD  metoprolol tartrate (LOPRESSOR) 25 MG tablet Take 0.5 tablets (12.5 mg total) by mouth  2 (two) times daily. 06/09/14  Yes Hosie Poisson, MD  Multiple Vitamins-Minerals (CENTRUM ADULTS PO) Take 1 tablet by mouth daily.   Yes [provider]  Omega-3 Fatty Acids (FISH OIL) 1000 MG CPDR Take 4,000 mg by mouth daily. Patient taking differently: Take 2,000 mg by mouth in the morning and at bedtime.  02/10/15  Yes Turner, Traci R, MD  pantoprazole (PROTONIX) 40 MG tablet TAKE 1 TABLET BY MOUTH ONCE DAILY. Patient taking differently: Take 40 mg by mouth in the morning.  06/21/19  Yes Turner, Eber Hong, MD  predniSONE (DELTASONE) 10 MG tablet Take 10 mg by mouth daily. 10/13/19  Yes [provider]  doxycycline (VIBRA-TABS) 100 MG tablet Take 1 tablet (100 mg total) by mouth 2 (two) times daily. 11/10/19   Tri Chittick, Okey Regal, MD    nitroGLYCERIN (NITROSTAT) 0.4 MG SL tablet Place 1 tablet (0.4 mg total) under the tongue every 5 (five) minutes as needed for chest pain. 08/17/18 10/20/19  Sueanne Margarita, MD    No Known Allergies  Social History   Tobacco Use  . Smoking status: Former Smoker    Packs/day: 1.00    Years: 30.00    Pack years: 30.00    Types: Cigarettes    Quit date: 04/08/2000    Years since quitting: 19.6  . Smokeless tobacco: Never Used  Vaping Use  . Vaping Use: Never used  Substance Use Topics  . Alcohol use: Yes    Comment: rare  . Drug use: No    Family History  Adopted: Yes  Problem Relation Age of Onset  . Colon cancer Neg Hx   . Esophageal cancer Neg Hx   . Rectal cancer Neg Hx   . Stomach cancer Neg Hx     Review of Systems  Constitutional: negative for fevers, chills and malaise Gastrointestinal: negative for nausea, vomiting and diarrhea Neurological: negative for headaches All other systems reviewed and are negative    Constitutional: in no apparent distress  Vitals:   11/10/19 0952  BP: (!) 149/85  Pulse: 72  SpO2: 94%   EYES: anicteric Cardiovascular: Cor RRR Respiratory: clear Musculoskeletal: no pedal edema noted Skin: negatives: peeling skin over both hands, rashes in varying degrees of resolution.  Punch biopsy site on his leg with some mild purulent drainage.    Labs: Lab Results  Component Value Date   WBC 6.6 10/26/2019   HGB 12.1 (L) 10/26/2019   HCT 36.7 (L) 10/26/2019   MCV 86.2 10/26/2019   PLT 220 10/26/2019    Lab Results  Component Value Date   CREATININE 0.89 10/26/2019   BUN 12 10/26/2019   NA 139 10/26/2019   K 4.0 10/26/2019   CL 105 10/26/2019   CO2 25 10/26/2019    Lab Results  Component Value Date   ALT 53 (H) 10/26/2019   AST 26 10/26/2019   ALKPHOS 41 10/26/2019   BILITOT 0.5 10/26/2019   INR 1.1 10/21/2019     Assessment:  He has a rash, seems to be worsened with sun exposure, associated with a fever with no signs of  infection and recently started on hydroxychloroquine.  I feel the most likely etiology is the medication, whether it is a drug reaction/erythroderma or something like toxic epidermal necrolysis is unclear but I have recommended he stop the medication now.  The biopsy results necrotic keratinocytes and also some eosinophils which can support either diagnosis.    Plan: 1) stop hydroxychloroquine and contact your rheumatologist.  2) if no improvement in the rash, see dermatology Call with new fever or new concerns.

## 2019-11-18 ENCOUNTER — Ambulatory Visit: Payer: BC Managed Care – PPO | Admitting: Cardiology

## 2019-11-24 ENCOUNTER — Other Ambulatory Visit: Payer: Self-pay

## 2019-11-24 ENCOUNTER — Encounter: Payer: Self-pay | Admitting: Dermatology

## 2019-11-24 ENCOUNTER — Ambulatory Visit: Payer: BC Managed Care – PPO | Admitting: Dermatology

## 2019-11-24 DIAGNOSIS — R21 Rash and other nonspecific skin eruption: Secondary | ICD-10-CM

## 2019-11-24 DIAGNOSIS — C4359 Malignant melanoma of other part of trunk: Secondary | ICD-10-CM | POA: Diagnosis not present

## 2019-11-24 DIAGNOSIS — S81801A Unspecified open wound, right lower leg, initial encounter: Secondary | ICD-10-CM | POA: Diagnosis not present

## 2019-11-24 DIAGNOSIS — T148XXA Other injury of unspecified body region, initial encounter: Secondary | ICD-10-CM

## 2019-11-24 DIAGNOSIS — C439 Malignant melanoma of skin, unspecified: Secondary | ICD-10-CM

## 2019-11-24 DIAGNOSIS — D489 Neoplasm of uncertain behavior, unspecified: Secondary | ICD-10-CM

## 2019-11-24 HISTORY — DX: Malignant melanoma of skin, unspecified: C43.9

## 2019-11-24 MED ORDER — MUPIROCIN 2 % EX OINT: 1.0000 "application " | TOPICAL_OINTMENT | Freq: Two times a day (BID) | CUTANEOUS | 0 refills | Status: DC

## 2019-11-24 MED ORDER — TRIAMCINOLONE ACETONIDE 0.1 % EX OINT: 1.0000 "application " | TOPICAL_OINTMENT | Freq: Every day | CUTANEOUS | 0 refills | Status: DC | PRN

## 2019-11-24 NOTE — Patient Instructions (Addendum)
Recommend daily broad spectrum sunscreen SPF 30+ to sun-exposed areas, reapply every 2 hours as needed. Call for new or changing lesions.  Wound Care Instructions  1. Cleanse wound gently with soap and water once a day then pat dry with clean gauze. Apply a thing coat of Petrolatum (petroleum jelly, "Vaseline") over the wound (unless you have an allergy to this). We recommend that you use a new, sterile tube of Vaseline. Do not pick or remove scabs. Do not remove the yellow or white "healing tissue" from the base of the wound.  2. Cover the wound with fresh, clean, nonstick gauze and secure with paper tape. You may use Band-Aids in place of gauze and tape if the would is small enough, but would recommend trimming much of the tape off as there is often too much. Sometimes Band-Aids can irritate the skin.  3. You should call the office for your biopsy report after 1 week if you have not already been contacted.  4. If you experience any problems, such as abnormal amounts of bleeding, swelling, significant bruising, significant pain, or evidence of infection, please call the office immediately.  5. FOR ADULT SURGERY PATIENTS: If you need something for pain relief you may take 1 extra strength Tylenol (acetaminophen) AND 2 Ibuprofen (200mg  each) together every 4 hours as needed for pain. (do not take these if you are allergic to them or if you have a reason you should not take them.) Typically, you may only need pain medication for 1 to 3 days.   Melanoma ABCDEs  Melanoma is the most dangerous type of skin cancer, and is the leading cause of death from skin disease.  You are more likely to develop melanoma if you:  Have light-colored skin, light-colored eyes, or red or blond hair  Spend a lot of time in the sun  Tan regularly, either outdoors or in a tanning bed  Have had blistering sunburns, especially during childhood  Have a close family member who has had a melanoma  Have atypical moles or  large birthmarks  Early detection of melanoma is key since treatment is typically straightforward and cure rates are extremely high if we catch it early.   The first sign of melanoma is often a change in a mole or a new dark spot.  The ABCDE system is a way of remembering the signs of melanoma.  A for asymmetry:  The two halves do not match. B for border:  The edges of the growth are irregular. C for color:  A mixture of colors are present instead of an even brown color. D for diameter:  Melanomas are usually (but not always) greater than 51mm - the size of a pencil eraser. E for evolution:  The spot keeps changing in size, shape, and color.  Please check your skin once per month between visits. You can use a small mirror in front and a large mirror behind you to keep an eye on the back side or your body.   If you see any new or changing lesions before your next follow-up, please call to schedule a visit.  Please continue daily skin protection including broad spectrum sunscreen SPF 30+ to sun-exposed areas, reapplying every 2 hours as needed when you're outdoors.

## 2019-11-24 NOTE — Progress Notes (Signed)
New Patient Visit  Subjective  Colin Lee is a 59 y.o. male who presents for the following: New Patient (Initial Visit) here for rash.  Patient presents today to establish care with this clinic for persistent rash. He was [facility] to [facility] on 7/14 with 2-3 days of fatigue then acute onset fever up to 106, chills and shortness of breath.  This was also associated with a rash that began on his lower extremities and then was noted and spreaad to his back, shoulders and torso Patient started Sulfasalazine and hydroxychlorquine about 1 month prior to rash and fever. Patient really noted the rash after being in the sun [place] the beach. Patient was also on prednisone with some improvement and is on a slow taper by Dr. Posey Pronto his rheumatologist. Both medications were stopped. Patient has also had 3 punch biopsy for rash, which showed interface dermatitis most consistent with a drug reaction but not definitive.   Patient notes the dark spot on his back has been there for many years. He is not aware of any changes.  The following portions of the chart were reviewed this encounter and updated as appropriate:  Tobacco  Allergies  Meds  Problems  Med Hx  Surg Hx  Fam Hx      Review of Systems:  No other skin or systemic complaints except as noted in HPI or Assessment and Plan.  Objective  Well appearing patient in no apparent distress; mood and affect are within normal limits.  An examination was performed including head, eyes, ears, nose, neck, chest, axillae, abdomen, back, bilateral upper extremities, bilateral lower extremities, hands, feet, fingers. All findings within normal limits unless otherwise noted below.  Objective  Right Upper Back: 1.1 cm irregular brown pink plaque  Objective  Right Lower Leg - Anterior: 0.8cm open wound  +2 Pedal pulse R. 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  Epidermal / dermal shaving  Lesion diameter (cm):  1.1 Informed consent: discussed and consent obtained   Timeout: patient name, date of birth, surgical site, and procedure verified   Anesthesia: the lesion was anesthetized in a standard fashion   Anesthetic:  1% lidocaine w/ epinephrine 1-100,000 buffered w/ 8.4% NaHCO3 Instrument used: flexible razor blade   Outcome: patient tolerated procedure well   Post-procedure details: sterile dressing applied and wound care instructions given   Dressing type: petrolatum and pressure dressing    Specimen 1 - Surgical pathology Differential Diagnosis: R/O MM Check Margins: No 1.1 cm irregular brown pink plaque  Shave removal today  Open wound Right Lower Leg - Anterior  Prior punch biopsy site. 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 Currently with rash much improved since discontinuation of hydroxychloroquine and Sulfasalazine  Given the possibility of drug-induced hypersensitivity syndrome, will repeat labs today and also include a TSH since delayed hypothyroidism can be seen after DIHS.  Order CBC with diff  Urinalysis CMP TSH  Start TMC 0.1% ointment apply twice daily as needed to affected areas for two weeks. Avoid applying to face, groin, and axilla. Use as directed. Risk of skin atrophy with long-term use reviewed.    Reassured patient that peeling is normal as the inflammation resolves and that the areas currently peeling should stop peeling with another 1 to 2 weeks  Topical steroids (such as triamcinolone, fluocinolone, fluocinonide, mometasone, clobetasol, halobetasol, betamethasone, hydrocortisone) can cause thinning and lightening of the skin if they are used for too long in the same area. Your physician has selected the right strength medicine for your problem and area affected on the body. Please use your medication only as directed by your physician to prevent side effects.    CBC with Differential/Platelets - B/L Lower extremities, B/L dorsal hands, arms, and upper back  TSH - B/L Lower extremities, B/L dorsal hands, arms, and upper back  Urinalysis with Reflex Microscopic - B/L Lower extremities, B/L dorsal hands, arms, and upper back  triamcinolone ointment (KENALOG) 0.1 % - B/L Lower extremities, B/L dorsal hands, arms, and upper back  CMP - B/L Lower extremities, B/L dorsal hands, arms, and upper back  Return in about 1 week (around 12/01/2019) for Rash follow up and bx results.  IDonzetta Kohut, CMA, am acting as scribe for Forest Gleason, MD .  Documentation: I have reviewed the above documentation for accuracy and completeness, and I agree with the above.  Forest Gleason, MD

## 2019-11-29 ENCOUNTER — Ambulatory Visit: Payer: BC Managed Care – PPO | Admitting: Cardiology

## 2019-12-01 ENCOUNTER — Ambulatory Visit: Payer: BC Managed Care – PPO | Admitting: Dermatology

## 2019-12-02 ENCOUNTER — Ambulatory Visit: Payer: BC Managed Care – PPO | Admitting: Dermatology

## 2019-12-02 ENCOUNTER — Encounter: Payer: Self-pay | Admitting: Dermatology

## 2019-12-02 NOTE — Progress Notes (Signed)
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 HISTOLOGIC TYPE: SUPERFICIAL SPREADING BRESLOW'S DEPTH/MAXIMUM TUMOR THICKNESS: 0.5 MM CLARK/ANATOMIC LEVEL: II. MARGINS PERIPHERAL MARGINS: NOT INVOLVED. DEEP MARGIN: NOT INVOLVED. ULCERATION: ABSENT. SATELLITOSIS: ABSENT. MITOTIC INDEX: 0 LYMPHO-VASCULAR INVASION: ABSENT. NEUROTROPISM: ABSENT. TUMOR-INFILTRATING LYMPHOCYTES: BRISK. TUMOR REGRESSION: PRESENT. LYMPH NODES (IF APPLICABLE): N/A PATHOLOGIC STAGE: PT1A  Called patient to review melanoma diagnosis. Patient was unable to come to clinic for discussion due to work schedule.  Discussed prognosis, treatment, and recommended every 3 month full body skin exams.   Discussed treatment plan including [facility] testing followed by excision (if low risk [facility] testing) or excision with possible SLN biopsy by general surgeon (if high risk [facility] testing).   Dr. Bartolo Darter Cc'd on this progress note.   Estill Bamberg, can you please schedule for excision 3-6 weeks from now and refer for [facility] testing? Thank you!

## 2019-12-27 ENCOUNTER — Telehealth: Payer: Self-pay

## 2019-12-27 NOTE — Telephone Encounter (Signed)
Patient's [place] [facility] test results are back. You will find this under Chart Review then under the Media tab.

## 2019-12-29 ENCOUNTER — Telehealth: Payer: Self-pay

## 2019-12-29 ENCOUNTER — Ambulatory Visit: Payer: BC Managed Care – PPO | Admitting: Dermatology

## 2019-12-29 NOTE — Telephone Encounter (Signed)
Reviewed [facility] melanoma results showing Class 1A and 4% chance of positive sentinel lymph node. Will continue with plan for excision in clinic. Discussed surgical procedure. All questions answered. He also has some spots under his arms he would like to have checked at follow-up - We will take a quick look at surgery and possibly address at suture removal visit vs another follow-up.

## 2019-12-29 NOTE — Telephone Encounter (Signed)
Spoke with Patient, he is going to call his work and check if he can get Next Wednesday 9/29 off instead of Wednesday 10/13. Patient asked about [place] [facility] testing, informed him that Dr. Laurence Ferrari would call and inform him when she received the results

## 2020-01-05 ENCOUNTER — Other Ambulatory Visit: Payer: Self-pay

## 2020-01-05 ENCOUNTER — Encounter: Payer: Self-pay | Admitting: Dermatology

## 2020-01-05 ENCOUNTER — Ambulatory Visit: Payer: BC Managed Care – PPO | Admitting: Dermatology

## 2020-01-05 DIAGNOSIS — C439 Malignant melanoma of skin, unspecified: Secondary | ICD-10-CM

## 2020-01-05 DIAGNOSIS — C4359 Malignant melanoma of other part of trunk: Secondary | ICD-10-CM | POA: Diagnosis not present

## 2020-01-05 MED ORDER — DOXYCYCLINE MONOHYDRATE 100 MG PO TABS: 100.0000 mg | ORAL_TABLET | Freq: Two times a day (BID) | ORAL | 0 refills | Status: AC

## 2020-01-05 MED ORDER — OXYCODONE HCL 5 MG PO CAPS: ORAL_CAPSULE | ORAL | 0 refills | Status: DC

## 2020-01-05 NOTE — Progress Notes (Signed)
Follow-Up Visit   Subjective  Colin Lee is a 59 y.o. male who presents for the following: Procedure.  Patient here today for excision of biopsy proven MM at the right upper back.   The following portions of the chart were reviewed this encounter and updated as appropriate:  Tobacco  Allergies  Meds  Problems  Med Hx  Surg Hx  Fam Hx      Review of Systems:  No other skin or systemic complaints except as noted in HPI or Assessment and Plan.  Objective  Well appearing patient in no apparent distress; mood and affect are within normal limits.  A focused examination was performed including back. Relevant physical exam findings are noted in the Assessment and Plan.  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  Skin excision  Lesion length (cm):  1.4 Lesion width (cm):  1.4 Margin per side (cm):  1 Total excision diameter (cm):  3.4 Informed consent: discussed and consent obtained   Timeout: patient name, date of birth, surgical site, and procedure verified   Procedure prep:  Patient was prepped and draped in usual sterile fashion Prep type:  Chlorhexidine Anesthesia: the lesion was anesthetized in a standard fashion   Anesthetic:  1% lidocaine w/ epinephrine 1-100,000 buffered w/ 8.4% NaHCO3 (total 26 ml) Instrument used: #10 blade   Hemostasis achieved with: suture, pressure and electrodesiccation   Outcome: patient tolerated procedure well with no complications   Post-procedure details: wound care instructions given   Additional details:  Excised down to fascia Specimen tagged at lateral-inferior tip Mupirocin and a pressure dressing applied  Skin repair Complexity:  Complex Final length (cm):  8.9 Informed consent: discussed and consent obtained   Timeout: patient name, date of birth, surgical site, and procedure verified   Procedure prep:  Patient was prepped and draped in usual  sterile fashion Prep type:  Chlorhexidine Anesthesia: the lesion was anesthetized in a standard fashion   Anesthesia comment:  25.5cc Anesthetic:  1% lidocaine w/ epinephrine 1-100,000 local infiltration Reason for type of repair: reduce tension to allow closure, reduce the risk of dehiscence, infection, and necrosis, reduce subcutaneous dead space and avoid a hematoma, allow closure of the large defect, allow side-to-side closure without requiring a flap or graft and enhance both functionality and cosmetic results   Undermining: area extensively undermined   Subcutaneous layers (deep stitches):  Suture size:  2-0 and 3-0 Suture type: Vicryl (polyglactin 910)   Stitches:  Buried vertical mattress (retention sutures placed; 2 layers of deeps to obliterate dead space) Fine/surface layer approximation (top stitches):  Suture size:  4-0 Suture type: Prolene (polypropylene)   Hemostasis achieved with: pressure and electrodesiccation Outcome: patient tolerated procedure well with no complications   Post-procedure details: wound care instructions given   Additional details:  Extensive undermining greater than the maximum width of the defect along at least one entire edge of the defect was performed Maximum width of defect perpendicular to the line of the closure 3.2 cm Width of undermining done 4.0 cm  Mupirocin and a pressure bandage applied   Specimen 1 - Surgical pathology Differential Diagnosis: Biopsy proven MM Check Margins: No [id number] Specimen tagged at lateral inferior edge  Patient unable to take ibuprofen.  Will prescribe oxycodone for short-term use to take as needed for pain over the next 4 days.  Do not drive while taking or operate machinery while taking.  Caution sedation.  Ordered Medications: doxycycline (ADOXA) 100 MG tablet  Return in about 1 week (around 01/12/2020) for Suture Removal.    Graciella Belton, RMA, am acting as scribe for Forest Gleason, MD  .  Documentation: I have reviewed the above documentation for accuracy and completeness, and I agree with the above.  Forest Gleason, MD

## 2020-01-05 NOTE — Patient Instructions (Signed)

## 2020-01-06 ENCOUNTER — Telehealth: Payer: Self-pay

## 2020-01-06 NOTE — Telephone Encounter (Signed)
Post op phone call to check on patient following yesterday's surgery, no answer, left msg, JS

## 2020-01-10 ENCOUNTER — Other Ambulatory Visit: Payer: Self-pay | Admitting: Dermatology

## 2020-01-10 DIAGNOSIS — T148XXA Other injury of unspecified body region, initial encounter: Secondary | ICD-10-CM

## 2020-01-11 NOTE — Progress Notes (Signed)
Skin (M), right upper back NO RESIDUAL MELANOMA, MARGINS FREE  No additional treatment needed at this time.  We will repeat full body skin exam in 3 months and continue monitoring.

## 2020-01-12 ENCOUNTER — Other Ambulatory Visit: Payer: Self-pay

## 2020-01-12 ENCOUNTER — Ambulatory Visit (INDEPENDENT_AMBULATORY_CARE_PROVIDER_SITE_OTHER): Payer: BC Managed Care – PPO | Admitting: Dermatology

## 2020-01-12 DIAGNOSIS — Z4802 Encounter for removal of sutures: Secondary | ICD-10-CM

## 2020-01-12 NOTE — Progress Notes (Addendum)
   Follow-Up Visit   Subjective  Colin Lee is a 59 y.o. male who presents for the following: Follow-up (Patient here today for suture removal at right posterior shoulder for excision of invasive melanoma).  The following portions of the chart were reviewed this encounter and updated as appropriate:  Tobacco  Allergies  Meds  Problems  Med Hx  Surg Hx  Fam Hx      Review of Systems:  No other skin or systemic complaints except as noted in HPI or Assessment and Plan.  Objective  Well appearing patient in no apparent distress; mood and affect are within normal limits.  A focused examination was performed including back, shoulders. Relevant physical exam findings are noted in the Assessment and Plan.    Assessment & Plan    Encounter for Removal of Sutures after excision of invasive melanoma - Incision site at the right post shoulder is clean, dry and intact - Wound cleansed, sutures removed, wound cleansed and steri strips applied.  - Discussed pathology results showing margins free  - Patient advised to keep steri-strips dry until they fall off. - Scars remodel for a full year. - Once steri-strips fall off, patient can apply over-the-counter silicone scar cream each night to help with scar remodeling if desired. - Patient advised to call with any concerns or if they notice any new or changing lesions.   Return in about 3 months (around 04/13/2020) for TBSE.  Graciella Belton, RMA, am acting as scribe for Forest Gleason, MD .  Documentation: I have reviewed the above documentation for accuracy and completeness, and I agree with the above.  Forest Gleason, MD

## 2020-01-19 ENCOUNTER — Encounter: Payer: BC Managed Care – PPO | Admitting: Dermatology

## 2020-01-31 ENCOUNTER — Encounter: Payer: Self-pay | Admitting: Dermatology

## 2020-02-18 ENCOUNTER — Other Ambulatory Visit: Payer: Self-pay

## 2020-02-18 ENCOUNTER — Ambulatory Visit (INDEPENDENT_AMBULATORY_CARE_PROVIDER_SITE_OTHER): Payer: BC Managed Care – PPO | Admitting: Cardiology

## 2020-02-18 ENCOUNTER — Encounter: Payer: Self-pay | Admitting: Cardiology

## 2020-02-18 VITALS — BP 146/70 | HR 93 | Ht 70.0 in | Wt 316.6 lb

## 2020-02-18 DIAGNOSIS — I2510 Atherosclerotic heart disease of native coronary artery without angina pectoris: Secondary | ICD-10-CM | POA: Diagnosis not present

## 2020-02-18 DIAGNOSIS — I10 Essential (primary) hypertension: Secondary | ICD-10-CM | POA: Diagnosis not present

## 2020-02-18 DIAGNOSIS — E785 Hyperlipidemia, unspecified: Secondary | ICD-10-CM | POA: Diagnosis not present

## 2020-02-18 DIAGNOSIS — R0602 Shortness of breath: Secondary | ICD-10-CM | POA: Diagnosis not present

## 2020-02-18 NOTE — Addendum Note (Signed)
Addended by: Antonieta Iba on: 02/18/2020 03:33 PM   Modules accepted: Orders

## 2020-02-18 NOTE — Patient Instructions (Signed)
Medication Instructions:  Your physician recommends that you continue on your current medications as directed. Please refer to the Current Medication list given to you today.  *If you need a refill on your cardiac medications before your next appointment, please call your pharmacy*   Lab Work: Fasting lipids and ALT If you have labs (blood work) drawn today and your tests are completely normal, you will receive your results only by: Marland Kitchen MyChart Message (if you have MyChart) OR . A paper copy in the mail If you have any lab test that is abnormal or we need to change your treatment, we will call you to review the results.  Follow-Up: At [facility], you and your health needs are our priority.  As part of our continuing mission to provide you with exceptional heart care, we have created designated Provider Care Teams.  These Care Teams include your primary Cardiologist (physician) and Advanced Practice Providers (APPs -  Physician Assistants and Nurse Practitioners) who all work together to provide you with the care you need, when you need it.  Your next appointment:   1 year(s)  The format for your next appointment:   In Person  Provider:   You may see Fransico Him, MD or one of the following Advanced Practice Providers on your designated Care Team:    Melina Copa, PA-C  Ermalinda Barrios, PA-C  Other Instructions You have been referred to the [facility]

## 2020-02-18 NOTE — Progress Notes (Signed)
Cardiology Office Note:    Date:  02/18/2020   ID:  KOA ZOELLER, DOB [birth year], MRN [id number]  PCP:  [name], MD  Cardiologist:  Fransico Him, MD    Referring MD: [name], MD   Chief Complaint  Patient presents with  . Coronary Artery Disease  . Hypertension  . Hyperlipidemia  . Shortness of Breath    History of Present Illness:    Colin Lee is a 59 y.o. male with a hx of recurrent left pleural effusion s/p drainage and talc pleurodesis with Dr. [name]. He has a history of ASCAD with cath showing high grade proximal RCA stenosis that was treated with DES x 2. He had recurrent CP post PCI. Re-look cath demonstrated patent stents in the RCA. He has a history of moderate pleural effusion and community acquired pneumonia. He underwent thoracocentesis and was found to have bloody effusion with fluid analysis showing lymphocytes. ID and pulmonary consulted and suggested this could be rheumatologic in origin and rheumatology work up ordered and was negative. No malignancy noted.  When I saw him last he was complaining of SOB and was Tx for PNA but continued to have SOB and mild chest pain.  Lexiscan myoview showed no ischemia and 2D echo was normal.    He is here today for followup and is doing well from a cardiac standpoint but unfortunately was dx with RA and it has affected his shoulders and arm. He has been on Humira and  steroids and has gained at least 20lbs all in his abdomen. He has chronic DOE due to the obesity.  He denies any chest pain or pressure,  PND, orthopnea, LE edema,  palpitations or syncope. He occasionally has some dizzy spells but no syncope.  He is compliant with his meds and is tolerating meds with no SE.    Past Medical History:  Diagnosis Date  . Acute febrile illness 10/20/2019  . Allergy   . Arthralgia 06/03/2014  . Arthritis   . Asthma   . Bacteremia 10/21/2019  . CAD (coronary artery disease) 06/20/2014  . Cellulitis  and abscess of right leg with Bacteremia 10/21/2019  . Chest pain 05/31/2014  . Cholelithiases 10/26/2013  . Claudication ([place]) 04/15/2016  . COPD (chronic obstructive pulmonary disease) ([place]) 10/21/2019  . Coronary artery disease 2016   90% RCA and 30% LM S/P PCI of RCA  . DOE (dyspnea on exertion)    Quit smoking 2002 - Spirometry 03/29/2019  FEV1 2.25 (55%)  Ratio 0.74 with no resp to saba p ? Prior    Onset June 2020 with cards w/u neg 02/2019 and assoc with atypical cp on prn ppi - 03/29/2019   Walked RA x two laps =  approx 552ft @ fast pace - stopped due to end of study with sats of 92% at the end of the study and mild sob  - 03/29/2019 max rx for GERD     . Elevated troponin   . GERD (gastroesophageal reflux disease)   . Headache    stress and tension  . History of nuclear stress test    Myoview 1/19: EF 61, no ischemia, low risk  . Hyperlipidemia   . Hypertension   . Melanoma ([place]) 11/24/2019   Right upper back. Superficial spreading. Breslow's 0.61mm. Clark's II  . Morbid obesity due to excess calories ([place]) 04/01/2019   Baseline wt around 250 when quit smoking 2002   . Neuromuscular disorder ([facility])    restless legs and  leg cramps  . Normocytic anemia 06/03/2014  . Rash and nonspecific skin eruption   . Restless leg syndrome 12/28/2013  . Rheumatoid arthritis ([place]) 06/2019  . Shortness of breath    Starting May 2015  . Stented coronary artery   . 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 Tubular adenoma of colon 03/2013    Past Surgical History:  Procedure Laterality Date  . CHEST TUBE INSERTION Left 02/01/2014   Procedure: INSERTION PLEURAL DRAINAGE CATHETER;  Surgeon: Grace Isaac, MD;  Location: [place];  Service: Thoracic;  Laterality: Left;  . COLONOSCOPY    . COLONOSCOPY W/ BIOPSIES AND POLYPECTOMY  2014   benign  . LEFT HEART CATHETERIZATION WITH CORONARY ANGIOGRAM N/A 06/07/2014   Procedure: LEFT HEART CATHETERIZATION WITH CORONARY ANGIOGRAM;  Surgeon:  Peter M Martinique, MD;  [facility];  Service: Cardiovascular;  Laterality: N/A;  . LEFT HEART CATHETERIZATION WITH CORONARY ANGIOGRAM N/A 06/08/2014   Procedure: LEFT HEART CATHETERIZATION WITH CORONARY ANGIOGRAM;  Surgeon: Sinclair Grooms, MD;  Location: [facility] CATH LAB;  Service: Cardiovascular;  Laterality: N/A;  . MASS EXCISION N/A 10/25/2019   Procedure: SKIN BIOPSY;  Surgeon: Aviva Signs, MD;  Location: [facility];  Service: General;  Laterality: N/A;  . no prior surgery    . PLEURAL BIOPSY Left 02/01/2014   Procedure: PLEURAL BIOPSY;  Surgeon: Grace Isaac, MD;  Location: [place];  Service: Thoracic;  Laterality: Left;  . PLEURAL EFFUSION DRAINAGE Left 02/01/2014   Procedure: DRAINAGE OF PLEURAL EFFUSION;  Surgeon: Grace Isaac, MD;  Location: [place];  Service: Thoracic;  Laterality: Left;  . REMOVAL OF PLEURAL DRAINAGE CATHETER Left 02/16/2014   Procedure: REMOVAL OF PLEURAL DRAINAGE CATHETER;  Surgeon: Grace Isaac, MD;  Location: [place];  Service: Thoracic;  Laterality: Left;  . TALC PLEURODESIS Left 02/01/2014   Procedure: Pietro Cassis;  Surgeon: Grace Isaac, MD;  Location: [place];  Service: Thoracic;  Laterality: Left;  . THORACENTESIS Left 2015  . VIDEO ASSISTED THORACOSCOPY Left 02/01/2014   Procedure: VIDEO ASSISTED THORACOSCOPY;  Surgeon: Grace Isaac, MD;  Location: [place];  Service: Thoracic;  Laterality: Left;  Marland Kitchen VIDEO BRONCHOSCOPY N/A 02/01/2014   Procedure: VIDEO BRONCHOSCOPY;  Surgeon: Grace Isaac, MD;  Location: [facility] OR;  Service: Thoracic;  Laterality: N/A;    Current Medications: Current Meds  Medication Sig  . acetaminophen (TYLENOL) 500 MG tablet Take 1,000 mg by mouth every 6 (six) hours as needed for moderate pain.  . Adalimumab (HUMIRA) 40 MG/0.4ML PSKT Inject into the skin.  Marland Kitchen aspirin EC 81 MG tablet Take 1 tablet (81 mg total) by mouth daily with breakfast.  . atorvastatin (LIPITOR) 40 MG tablet One half daily (Patient taking  differently: Take 20 mg by mouth in the morning. )  . clopidogrel (PLAVIX) 75 MG tablet TAKE 1 TABLET BY MOUTH ONCE DAILY. (Patient taking differently: Take 75 mg by mouth in the morning. )  . doxycycline (VIBRA-TABS) 100 MG tablet Take 1 tablet (100 mg total) by mouth 2 (two) times daily.  . famotidine (PEPCID) 20 MG tablet One after supper (Patient taking differently: Take 29 mg by mouth at bedtime. )  . losartan (COZAAR) 50 MG tablet Take 1 tablet (50 mg total) by mouth daily.  . metoprolol tartrate (LOPRESSOR) 25 MG tablet Take 0.5 tablets (12.5 mg total) by mouth 2 (two) times daily.  . Multiple Vitamins-Minerals (CENTRUM ADULTS PO) Take 1 tablet by mouth daily.  Marland Kitchen  mupirocin ointment (BACTROBAN) 2 % APPLY TO AFFECTED AREA TWICE DAILY  . nitroGLYCERIN (NITROSTAT) 0.4 MG SL tablet Place 1 tablet (0.4 mg total) under the tongue every 5 (five) minutes as needed for chest pain.  . Omega-3 Fatty Acids (FISH OIL) 1000 MG CPDR Take 4,000 mg by mouth daily. (Patient taking differently: Take 2,000 mg by mouth in the morning and at bedtime. )  . oxycodone (OXY-IR) 5 MG capsule Take one capsule every 4-6 hours as needed for pain. Do not drive or operate machinery while taking this medication.  . pantoprazole (PROTONIX) 40 MG tablet TAKE 1 TABLET BY MOUTH ONCE DAILY. (Patient taking differently: Take 40 mg by mouth in the morning. )  . predniSONE (DELTASONE) 10 MG tablet Take 10 mg by mouth daily.  Marland Kitchen triamcinolone ointment (KENALOG) 0.1 % Apply 1 application topically daily as needed.     Allergies:   Hydroxychloroquine   Social History   Socioeconomic History  . Marital status: Married    Spouse name: Not on file  . Number of children: Not on file  . Years of education: Not on file  . Highest education level: Not on file  Occupational History  . Not on file  Tobacco Use  . Smoking status: Former Smoker    Packs/day: 1.00    Years: 30.00    Pack years: 30.00    Types: Cigarettes    Quit  date: 04/08/2000    Years since quitting: 19.8  . Smokeless tobacco: Never Used  Vaping Use  . Vaping Use: Never used  Substance and Sexual Activity  . Alcohol use: Yes    Comment: rare  . Drug use: No  . Sexual activity: Not on file  Other Topics Concern  . Not on file  Social History Narrative  . Not on file   Social Determinants of Health   Financial Resource Strain:   . Difficulty of Paying Living Expenses: Not on file  Food Insecurity:   . Worried About Charity fundraiser in the Last Year: Not on file  . Ran Out of Food in the Last Year: Not on file  Transportation Needs:   . Lack of Transportation (Medical): Not on file  . Lack of Transportation (Non-Medical): Not on file  Physical Activity:   . Days of Exercise per Week: Not on file  . Minutes of Exercise per Session: Not on file  Stress:   . Feeling of Stress : Not on file  Social Connections:   . Frequency of Communication with Friends and Family: Not on file  . Frequency of Social Gatherings with Friends and Family: Not on file  . Attends Religious Services: Not on file  . Active Member of Clubs or Organizations: Not on file  . Attends Archivist Meetings: Not on file  . Marital Status: Not on file     Family History: The patient's family history is negative for Colon cancer, Esophageal cancer, Rectal cancer, and Stomach cancer. He was adopted.  ROS:   Please see the history of present illness.    ROS  All other systems reviewed and negative.   EKGs/Labs/Other Studies Reviewed:    The following studies were reviewed today: none none EKG:  EKG is  ordered today and showed   Recent Labs: 03/29/2019: Pro B Natriuretic peptide (BNP) 81.0 10/20/2019: B Natriuretic Peptide 76.0 10/21/2019: Magnesium 1.7 10/23/2019: TSH 1.777 10/26/2019: ALT 53; BUN 12; Creatinine, Ser 0.89; Hemoglobin 12.1; Platelets 220; Potassium 4.0; Sodium 139  Recent Lipid Panel    Component Value Date/Time   CHOL 123  10/23/2019 0632   TRIG 93 10/23/2019 0632   HDL 30 (L) 10/23/2019 0632   CHOLHDL 4.1 10/23/2019 0632   VLDL 19 10/23/2019 0632   LDLCALC 74 10/23/2019 0632   LDLCALC 46 09/05/2017 0824    Physical Exam:    VS:  BP (!) 146/70   Pulse 93   Ht 5\' 10"  (1.778 m)   Wt (!) 316 lb 9.6 oz (143.6 kg)   SpO2 96%   BMI 45.43 kg/m     Wt Readings from Last 3 Encounters:  02/18/20 (!) 316 lb 9.6 oz (143.6 kg)  11/10/19 298 lb (135.2 kg)  10/21/19 297 lb 11.2 oz (135 kg)    GEN: Well nourished, well developed in no acute distress morbidly obese HEENT: Normal NECK: No JVD; No carotid bruits LYMPHATICS: No lymphadenopathy CARDIAC:RRR, no murmurs, rubs, gallops RESPIRATORY:  Clear to auscultation without rales, wheezing or rhonchi  ABDOMEN: Soft, non-tender, non-distended MUSCULOSKELETAL:  No edema; No deformity  SKIN: Warm and dry NEUROLOGIC:  Alert and oriented x 3 PSYCHIATRIC:  Normal affect    ASSESSMENT:    1. Coronary artery disease involving native coronary artery of native heart without angina pectoris   2. Primary hypertension   3. Dyslipidemia   4. SOB (shortness of breath)    PLAN:    In order of problems listed above:  1.  ASCAD -cath 2016 showed high grade proximal RCA stenosis that was treated with DES x 2. He had recurrent CP post PCI. Re-look cath demonstrated patent stents in the RCA. -nuclear stress test at last OV for CP and SOB showed no ischemia 02/2019. -he has not had any further CP -continue ASA 81mg  daily, Plavix 75mg  daily, BB and statin  2.  HTN -BP borderline controlled on exam today -continue Losartan 100mg  daily and lopressor 12.5mg  BID -creatinine 0.89 in July 2021  3.  HLD -LDL goal < 70 -LDL 74 in July -repeat FLP and ALT due to weight gain -continue atorvastatin 40mg  daily  4.  SOB -felt to be pulmonary in origin last year -now remains SOB due to significant weight gain from steroids that he has been on for 8 months -2D echo and  lexi myoview were normal last year  5. Morbid Obesity -I have recommended that we refer him to [facility] to help him with weight loss -he has a home sleeps study ordered by his lung MD   Medication Adjustments/Labs and Tests Ordered: Current medicines are reviewed at length with the patient today.  Concerns regarding medicines are outlined above.  No orders of the defined types were placed in this encounter.  No orders of the defined types were placed in this encounter.   Signed, Fransico Him, MD  02/18/2020 3:29 PM    [place]

## 2020-02-22 ENCOUNTER — Other Ambulatory Visit: Payer: Self-pay | Admitting: Cardiology

## 2020-02-22 MED ORDER — NITROGLYCERIN 0.4 MG SL SUBL: SUBLINGUAL_TABLET | SUBLINGUAL | 6 refills | Status: DC

## 2020-02-22 NOTE — Addendum Note (Signed)
Addended by: Carter Kitten D on: 02/22/2020 03:22 PM   Modules accepted: Orders

## 2020-03-28 ENCOUNTER — Institutional Professional Consult (permissible substitution): Payer: BC Managed Care – PPO | Admitting: Internal Medicine

## 2020-04-20 ENCOUNTER — Ambulatory Visit: Payer: BC Managed Care – PPO | Admitting: Dermatology

## 2020-05-15 ENCOUNTER — Observation Stay (HOSPITAL_COMMUNITY)
Admission: EM | Admit: 2020-05-15 | Discharge: 2020-05-18 | Disposition: A | Discharge status: Home / Self Care | Payer: BC Managed Care – PPO | Attending: Internal Medicine | Admitting: Internal Medicine

## 2020-05-15 ENCOUNTER — Other Ambulatory Visit: Payer: Self-pay

## 2020-05-15 ENCOUNTER — Emergency Department (HOSPITAL_BASED_OUTPATIENT_CLINIC_OR_DEPARTMENT_OTHER): Payer: BC Managed Care – PPO

## 2020-05-15 ENCOUNTER — Emergency Department (HOSPITAL_COMMUNITY): Payer: BC Managed Care – PPO

## 2020-05-15 ENCOUNTER — Encounter (HOSPITAL_COMMUNITY): Payer: Self-pay

## 2020-05-15 ENCOUNTER — Telehealth: Payer: Self-pay | Admitting: Cardiology

## 2020-05-15 DIAGNOSIS — J449 Chronic obstructive pulmonary disease, unspecified: Secondary | ICD-10-CM | POA: Diagnosis present

## 2020-05-15 DIAGNOSIS — I10 Essential (primary) hypertension: Secondary | ICD-10-CM | POA: Diagnosis not present

## 2020-05-15 DIAGNOSIS — R0602 Shortness of breath: Secondary | ICD-10-CM | POA: Diagnosis not present

## 2020-05-15 DIAGNOSIS — U071 COVID-19: Secondary | ICD-10-CM | POA: Insufficient documentation

## 2020-05-15 DIAGNOSIS — R0902 Hypoxemia: Secondary | ICD-10-CM | POA: Diagnosis not present

## 2020-05-15 DIAGNOSIS — I2510 Atherosclerotic heart disease of native coronary artery without angina pectoris: Secondary | ICD-10-CM | POA: Diagnosis not present

## 2020-05-15 DIAGNOSIS — E785 Hyperlipidemia, unspecified: Secondary | ICD-10-CM | POA: Diagnosis not present

## 2020-05-15 DIAGNOSIS — J849 Interstitial pulmonary disease, unspecified: Secondary | ICD-10-CM | POA: Diagnosis not present

## 2020-05-15 DIAGNOSIS — C439 Malignant melanoma of skin, unspecified: Secondary | ICD-10-CM | POA: Insufficient documentation

## 2020-05-15 DIAGNOSIS — R079 Chest pain, unspecified: Secondary | ICD-10-CM | POA: Diagnosis not present

## 2020-05-15 DIAGNOSIS — Z6841 Body Mass Index (BMI) 40.0 and over, adult: Secondary | ICD-10-CM | POA: Insufficient documentation

## 2020-05-15 DIAGNOSIS — I200 Unstable angina: Secondary | ICD-10-CM

## 2020-05-15 DIAGNOSIS — Z79899 Other long term (current) drug therapy: Secondary | ICD-10-CM | POA: Diagnosis not present

## 2020-05-15 DIAGNOSIS — Z87891 Personal history of nicotine dependence: Secondary | ICD-10-CM | POA: Diagnosis not present

## 2020-05-15 LAB — BASIC METABOLIC PANEL
Anion gap: 7 (ref 5–15)
BUN: 20 mg/dL (ref 6–20)
CO2: 25 mmol/L (ref 22–32)
Calcium: 9 mg/dL (ref 8.9–10.3)
Chloride: 105 mmol/L (ref 98–111)
Creatinine, Ser: 0.89 mg/dL (ref 0.61–1.24)
GFR, Estimated: >60 mL/min (ref >60)
Glucose, Bld: 168 mg/dL — ABNORMAL HIGH (ref 70–99)
Potassium: 4 mmol/L (ref 3.5–5.1)
Sodium: 137 mmol/L (ref 135–145)

## 2020-05-15 LAB — CBC
HCT: 37.7 % — ABNORMAL LOW (ref 39.0–52.0)
Hemoglobin: 11.8 g/dL — ABNORMAL LOW (ref 13.0–17.0)
MCH: 27.6 pg (ref 26.0–34.0)
MCHC: 31.3 g/dL (ref 30.0–36.0)
MCV: 88.1 fL (ref 80.0–100.0)
Platelets: 253 10*3/uL (ref 150–400)
RBC: 4.28 MIL/uL (ref 4.22–5.81)
RDW: 14.2 % (ref 11.5–15.5)
WBC: 7.5 10*3/uL (ref 4.0–10.5)
nRBC: 0 % (ref 0.0–0.2)

## 2020-05-15 LAB — ECHOCARDIOGRAM COMPLETE
AR max vel: 2.3 cm2
AV Area VTI: 3.03 cm2
AV Area mean vel: 2.41 cm2
AV Mean grad: 6.3 mmHg
AV Peak grad: 12 mmHg
Ao pk vel: 1.73 m/s
Area-P 1/2: 2.1 cm2
Height: 70 in
S' Lateral: 2.4 cm
Weight: 5088 oz

## 2020-05-15 LAB — TROPONIN I (HIGH SENSITIVITY)
Troponin I (High Sensitivity): 13 ng/L (ref <18)
Troponin I (High Sensitivity): 11 ng/L (ref <18)

## 2020-05-15 LAB — BRAIN NATRIURETIC PEPTIDE: B Natriuretic Peptide: 87 pg/mL (ref 0.0–100.0)

## 2020-05-15 LAB — SARS CORONAVIRUS 2 BY RT PCR (HOSPITAL ORDER, PERFORMED IN ~~LOC~~ HOSPITAL LAB): SARS Coronavirus 2: POSITIVE — AB

## 2020-05-15 MED ORDER — NITROGLYCERIN 0.4 MG SL SUBL: 0.4000 mg | SUBLINGUAL_TABLET | SUBLINGUAL | Status: DC | PRN

## 2020-05-15 MED ORDER — ASPIRIN 81 MG PO CHEW
324.0000 mg | CHEWABLE_TABLET | ORAL | Status: AC
  Filled 2020-05-15: qty 4

## 2020-05-15 MED ORDER — NITROGLYCERIN IN D5W 200-5 MCG/ML-% IV SOLN
5.0000 ug/min | INTRAVENOUS | Status: DC
  Administered 2020-05-15: 5 ug/min via INTRAVENOUS
  Filled 2020-05-15: qty 250

## 2020-05-15 MED ORDER — METOPROLOL TARTRATE 12.5 MG HALF TABLET
12.5000 mg | ORAL_TABLET | Freq: Two times a day (BID) | ORAL | Status: DC
  Administered 2020-05-15 – 2020-05-18 (×6): 12.5 mg via ORAL
  Filled 2020-05-15 (×6): qty 1

## 2020-05-15 MED ORDER — CLOPIDOGREL BISULFATE 75 MG PO TABS
75.0000 mg | ORAL_TABLET | Freq: Every day | ORAL | Status: DC
  Administered 2020-05-16 – 2020-05-18 (×3): 75 mg via ORAL
  Filled 2020-05-15 (×3): qty 1

## 2020-05-15 MED ORDER — LOSARTAN POTASSIUM 50 MG PO TABS
50.0000 mg | ORAL_TABLET | Freq: Every day | ORAL | Status: DC
  Administered 2020-05-16 – 2020-05-18 (×3): 50 mg via ORAL
  Filled 2020-05-15: qty 2
  Filled 2020-05-15 (×2): qty 1

## 2020-05-15 MED ORDER — PREDNISONE 10 MG PO TABS
10.0000 mg | ORAL_TABLET | Freq: Two times a day (BID) | ORAL | Status: DC
  Administered 2020-05-15 – 2020-05-18 (×5): 10 mg via ORAL
  Filled 2020-05-15 (×5): qty 1

## 2020-05-15 MED ORDER — HEPARIN BOLUS VIA INFUSION
4000.0000 [IU] | Freq: Once | INTRAVENOUS | Status: AC
  Administered 2020-05-15: 4000 [IU] via INTRAVENOUS

## 2020-05-15 MED ORDER — ACETAMINOPHEN 325 MG PO TABS
650.0000 mg | ORAL_TABLET | ORAL | Status: DC | PRN
  Administered 2020-05-16: 650 mg via ORAL
  Filled 2020-05-15: qty 2

## 2020-05-15 MED ORDER — ALBUTEROL SULFATE (2.5 MG/3ML) 0.083% IN NEBU: 3.0000 mL | INHALATION_SOLUTION | Freq: Four times a day (QID) | RESPIRATORY_TRACT | Status: DC | PRN

## 2020-05-15 MED ORDER — ASPIRIN 300 MG RE SUPP: 300.0000 mg | RECTAL | Status: DC

## 2020-05-15 MED ORDER — ATORVASTATIN CALCIUM 10 MG PO TABS
20.0000 mg | ORAL_TABLET | Freq: Every morning | ORAL | Status: DC
  Administered 2020-05-16 – 2020-05-18 (×3): 20 mg via ORAL
  Filled 2020-05-15 (×3): qty 2

## 2020-05-15 MED ORDER — FAMOTIDINE 20 MG PO TABS
30.0000 mg | ORAL_TABLET | Freq: Every day | ORAL | Status: DC
  Administered 2020-05-15 – 2020-05-17 (×3): 30 mg via ORAL
  Filled 2020-05-15 (×3): qty 2

## 2020-05-15 MED ORDER — HEPARIN (PORCINE) 25000 UT/250ML-% IV SOLN
1500.0000 [IU]/h | INTRAVENOUS | Status: DC
  Administered 2020-05-15: 1300 [IU]/h via INTRAVENOUS
  Administered 2020-05-16: 1500 [IU]/h via INTRAVENOUS
  Filled 2020-05-15 (×3): qty 250

## 2020-05-15 MED ORDER — ONDANSETRON HCL 4 MG/2ML IJ SOLN: 4.0000 mg | Freq: Four times a day (QID) | INTRAMUSCULAR | Status: DC | PRN

## 2020-05-15 MED ORDER — ASPIRIN EC 81 MG PO TBEC
81.0000 mg | DELAYED_RELEASE_TABLET | Freq: Every day | ORAL | Status: DC
Start: 2020-05-16 — End: 2020-05-18
  Administered 2020-05-17 – 2020-05-18 (×2): 81 mg via ORAL
  Filled 2020-05-15 (×3): qty 1

## 2020-05-15 NOTE — ED Provider Notes (Addendum)
[facility] EMERGENCY DEPARTMENT Provider Note   CSN: [id number] Arrival date & time: 05/15/20  1143     History Chief Complaint  Patient presents with  . Chest Pain    Colin Lee is a 60 y.o. male.  HPI Patient presents with chest pain shortness of breath.  Has had over the last couple weeks.  States that he has been able to do less physical activity because when he tries he gets shortness of breath and chest pain.  Does have history of coronary artery disease and also previous history of chronic lung disease.  States he also gets chest pain when he tries to lift things heavy with his arms.  States he has put on some weight due to steroids he was on from his rheumatologist.  No coughing.    Past Medical History:  Diagnosis Date  . Acute febrile illness 10/20/2019  . Allergy   . Arthralgia 06/03/2014  . Arthritis   . Asthma   . Bacteremia 10/21/2019  . CAD (coronary artery disease) 06/20/2014  . Cellulitis and abscess of right leg with Bacteremia 10/21/2019  . Chest pain 05/31/2014  . Cholelithiases 10/26/2013  . Claudication ([place]) 04/15/2016  . COPD (chronic obstructive pulmonary disease) ([place]) 10/21/2019  . Coronary artery disease 2016   90% RCA and 30% LM S/P PCI of RCA  . DOE (dyspnea on exertion)    Quit smoking 2002 - Spirometry 03/29/2019  FEV1 2.25 (55%)  Ratio 0.74 with no resp to saba p ? Prior    Onset June 2020 with cards w/u neg 02/2019 and assoc with atypical cp on prn ppi - 03/29/2019   Walked RA x two laps =  approx 555ft @ fast pace - stopped due to end of study with sats of 92% at the end of the study and mild sob  - 03/29/2019 max rx for GERD     . Elevated troponin   . GERD (gastroesophageal reflux disease)   . Headache    stress and tension  . History of nuclear stress test    Myoview 1/19: EF 61, no ischemia, low risk  . Hyperlipidemia   . Hypertension   . Melanoma ([place]) 11/24/2019   Right upper back. Superficial spreading. Breslow's 0.22mm. Clark's  II  . Morbid obesity due to excess calories ([place]) 04/01/2019   Baseline wt around 250 when quit smoking 2002   . Neuromuscular disorder ([facility])    restless legs and leg cramps  . Normocytic anemia 06/03/2014  . Rash and nonspecific skin eruption   . Restless leg syndrome 12/28/2013  . Rheumatoid arthritis ([place]) 06/2019  . Shortness of breath    Starting May 2015  . Stented coronary artery   . 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 Tubular adenoma of colon 03/2013    Patient Active Problem List   Diagnosis Date Noted  . 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 Rash and nonspecific skin eruption   . GERD (gastroesophageal reflux disease) 10/21/2019  . COPD (chronic obstructive pulmonary disease) ([place]) 10/21/2019  . Bacteremia due to Gram-positive bacteria 10/21/2019  . Cellulitis and abscess of right leg with Bacteremia 10/21/2019  . Bacteremia 10/21/2019  . Acute febrile illness 10/20/2019  . Morbid obesity due to excess calories ([place]) 04/01/2019  . Claudication ([place]) 04/15/2016  . CAD (coronary artery disease) 06/20/2014  . Dyslipidemia 06/20/2014  . Elevated troponin   . Stented coronary artery   . Myalgia 06/03/2014  . Arthralgia 06/03/2014  .  Normocytic anemia 06/03/2014  . Chest pain 05/31/2014  . DOE (dyspnea on exertion)   . Restless leg syndrome 12/28/2013  . Cholelithiases 10/26/2013  . Hypertension 10/06/2013    Past Surgical History:  Procedure Laterality Date  . CHEST TUBE INSERTION Left 02/01/2014   Procedure: INSERTION PLEURAL DRAINAGE CATHETER;  Surgeon: Delight Ovens, MD;  Location: [facility] OR;  Service: Thoracic;  Laterality: Left;  . COLONOSCOPY    . COLONOSCOPY W/ BIOPSIES AND POLYPECTOMY  2014   benign  . LEFT HEART CATHETERIZATION WITH CORONARY ANGIOGRAM N/A 06/07/2014   Procedure: LEFT HEART CATHETERIZATION WITH CORONARY ANGIOGRAM;  Surgeon: Peter M Swaziland, MD;  Location: [facility] CATH LAB;  Service: Cardiovascular;  Laterality: N/A;   . LEFT HEART CATHETERIZATION WITH CORONARY ANGIOGRAM N/A 06/08/2014   Procedure: LEFT HEART CATHETERIZATION WITH CORONARY ANGIOGRAM;  Surgeon: Lesleigh Noe, MD;  Location: [facility] CATH LAB;  Service: Cardiovascular;  Laterality: N/A;  . MASS EXCISION N/A 10/25/2019   Procedure: SKIN BIOPSY;  Surgeon: Franky Macho, MD;  Location: [facility] ORS;  Service: General;  Laterality: N/A;  . no prior surgery    . PLEURAL BIOPSY Left 02/01/2014   Procedure: PLEURAL BIOPSY;  Surgeon: Delight Ovens, MD;  Location: [facility] OR;  Service: Thoracic;  Laterality: Left;  . PLEURAL EFFUSION DRAINAGE Left 02/01/2014   Procedure: DRAINAGE OF PLEURAL EFFUSION;  Surgeon: Delight Ovens, MD;  Location: [facility] OR;  Service: Thoracic;  Laterality: Left;  . REMOVAL OF PLEURAL DRAINAGE CATHETER Left 02/16/2014   Procedure: REMOVAL OF PLEURAL DRAINAGE CATHETER;  Surgeon: Delight Ovens, MD;  Location: [facility] OR;  Service: Thoracic;  Laterality: Left;  . TALC PLEURODESIS Left 02/01/2014   Procedure: Lurlean Nanny;  Surgeon: Delight Ovens, MD;  Location: [facility] OR;  Service: Thoracic;  Laterality: Left;  . THORACENTESIS Left 2015  . VIDEO ASSISTED THORACOSCOPY Left 02/01/2014   Procedure: VIDEO ASSISTED THORACOSCOPY;  Surgeon: Delight Ovens, MD;  Location: [facility] OR;  Service: Thoracic;  Laterality: Left;  Marland Kitchen VIDEO BRONCHOSCOPY N/A 02/01/2014   Procedure: VIDEO BRONCHOSCOPY;  Surgeon: Delight Ovens, MD;  Location: [facility] OR;  Service: Thoracic;  Laterality: N/A;       Family History  Adopted: Yes  Problem Relation Age of Onset  . Colon cancer Neg Hx   . Esophageal cancer Neg Hx   . Rectal cancer Neg Hx   . Stomach cancer Neg Hx     Social History   Tobacco Use  . Smoking status: Former Smoker    Packs/day: 1.00    Years: 30.00    Pack years: 30.00    Types: Cigarettes    Quit date: 04/08/2000    Years since quitting: 20.1  . Smokeless tobacco: Never Used  Vaping Use  . Vaping Use: Never used  Substance Use Topics   . Alcohol use: Yes  . Drug use: No    Home Medications Prior to Admission medications   Medication Sig Start Date End Date Taking? Authorizing Provider  acetaminophen (TYLENOL) 500 MG tablet Take 1,000 mg by mouth every 6 (six) hours as needed for moderate pain.   Yes [provider]  Adalimumab (HUMIRA) 40 MG/0.4ML PSKT Inject into the skin.   Yes [provider]  albuterol (VENTOLIN HFA) 108 (90 Base) MCG/ACT inhaler Inhale 1 puff into the lungs every 6 (six) hours as needed for wheezing or shortness of breath.   Yes [provider]  aspirin EC 81 MG tablet Take 1 tablet (81  mg total) by mouth daily with breakfast. 10/26/19  Yes Emokpae, Courage, MD  atorvastatin (LIPITOR) 40 MG tablet One half daily Patient taking differently: Take 20 mg by mouth in the morning. 03/29/19  Yes Tanda Rockers, MD  clopidogrel (PLAVIX) 75 MG tablet TAKE 1 TABLET BY MOUTH ONCE DAILY. Patient taking differently: Take 75 mg by mouth in the morning. 04/01/19  Yes Turner, Eber Hong, MD  doxycycline (VIBRA-TABS) 100 MG tablet Take 1 tablet (100 mg total) by mouth 2 (two) times daily. 11/10/19  Yes Comer, Okey Regal, MD  famotidine (PEPCID) 20 MG tablet One after supper Patient taking differently: Take 29 mg by mouth at bedtime. 03/29/19  Yes Tanda Rockers, MD  losartan (COZAAR) 50 MG tablet Take 1 tablet (50 mg total) by mouth daily. 10/26/19  Yes Emokpae, Courage, MD  metoprolol tartrate (LOPRESSOR) 25 MG tablet Take 0.5 tablets (12.5 mg total) by mouth 2 (two) times daily. 06/09/14  Yes Hosie Poisson, MD  Multiple Vitamins-Minerals (CENTRUM ADULTS PO) Take 1 tablet by mouth daily.   Yes [provider]  nitroGLYCERIN (NITROSTAT) 0.4 MG SL tablet Place 1 tablet (0.4 mg total) under the tongue every 5 (five) minutesas needed for chest pain. 02/22/20  Yes Turner, Eber Hong, MD  Omega-3 Fatty Acids (FISH OIL) 1000 MG CPDR Take 4,000 mg by mouth daily. Patient taking differently: Take 2,000  mg by mouth in the morning and at bedtime. 02/10/15  Yes Turner, Eber Hong, MD  oxycodone (OXY-IR) 5 MG capsule Take one capsule every 4-6 hours as needed for pain. Do not drive or operate machinery while taking this medication. Patient taking differently: Take 5 mg by mouth every 6 (six) hours as needed for pain. 01/05/20  Yes Moye, [place], MD  pantoprazole (PROTONIX) 40 MG tablet TAKE 1 TABLET BY MOUTH ONCE DAILY. Patient taking differently: Take 40 mg by mouth in the morning. 06/21/19  Yes Turner, Eber Hong, MD  predniSONE (DELTASONE) 5 MG tablet Take 10 mg by mouth in the morning and at bedtime. Patient stated he takes two of the 5 mg tablets in the morning and evening.   Yes [provider]    Allergies    Hydroxychloroquine  Review of Systems   Review of Systems  Constitutional: Negative for appetite change and fever.  HENT: Negative for congestion.   Respiratory: Positive for shortness of breath.   Cardiovascular: Positive for chest pain.  Gastrointestinal: Negative for abdominal pain.  Genitourinary: Negative for flank pain.  Musculoskeletal: Negative for back pain.  Skin: Negative for rash.  Neurological: Negative for weakness.  Psychiatric/Behavioral: Negative for confusion.    Physical Exam Updated Vital Signs BP (!) 157/82   Pulse 76   Temp 98.3 F (36.8 C) (Oral)   Resp 16   Ht 5\' 10"  (1.778 m)   Wt (!) 144.2 kg   SpO2 96%   BMI 45.63 kg/m   Physical Exam Vitals and nursing note reviewed.  HENT:     Head: Atraumatic.  Neck:     Vascular: No JVD.  Cardiovascular:     Rate and Rhythm: Normal rate and regular rhythm.  Pulmonary:     Breath sounds: No wheezing, rhonchi or rales.  Chest:     Chest wall: No edema.  Musculoskeletal:     Right lower leg: Edema present.     Left lower leg: Edema present.     Comments: Pitting edema bilateral lower extremities.  Skin:    General: Skin is warm.  Capillary Refill: Capillary refill takes less than 2  seconds.  Neurological:     Mental Status: He is alert and oriented to person, place, and time.     ED Results / Procedures / Treatments   Labs (all labs ordered are listed, but only abnormal results are displayed) Labs Reviewed  SARS CORONAVIRUS 2 BY RT PCR (HOSPITAL ORDER, [place] [facility]) - Abnormal; Notable for the following components:      Result Value   SARS Coronavirus 2 POSITIVE (*)    All other components within normal limits  BASIC METABOLIC PANEL - Abnormal; Notable for the following components:   Glucose, Bld 168 (*)    All other components within normal limits  CBC - Abnormal; Notable for the following components:   Hemoglobin 11.8 (*)    HCT 37.7 (*)    All other components within normal limits  BRAIN NATRIURETIC PEPTIDE  HEPARIN LEVEL (UNFRACTIONATED)  TROPONIN I (HIGH SENSITIVITY)  TROPONIN I (HIGH SENSITIVITY)    EKG EKG Interpretation  Date/Time:  Monday May 15 2020 12:03:16 EST Ventricular Rate:  83 PR Interval:    QRS Duration: 93 QT Interval:  350 QTC Calculation: 412 R Axis:   11 Text Interpretation: Sinus rhythm Baseline wander in lead(s) II III aVF Confirmed by Davonna Belling [phone]) on 05/15/2020 12:28:27 PM   Radiology DG Chest 2 View  Result Date: 05/15/2020 CLINICAL DATA:  Chest pain EXAM: CHEST - 2 VIEW COMPARISON:  10/20/2019 chest radiograph. FINDINGS: Stable cardiomediastinal silhouette with normal heart size. No pneumothorax. No pleural effusion. Mildly hyperinflated lungs. Chronic patchy reticular opacities throughout the peripheral lungs bilaterally, left greater than right, not appreciably changed. No superimposed acute consolidative airspace disease or pulmonary edema. IMPRESSION: 1. No acute cardiopulmonary disease. 2. Chronic patchy reticular opacities throughout the peripheral lungs bilaterally, left greater than right, not appreciably changed, compatible with nonspecific pulmonary fibrosis as seen on prior  chest CT. Electronically Signed   By: Ilona Sorrel M.D.   On: 05/15/2020 12:57    Procedures Procedures   Medications Ordered in ED Medications  nitroGLYCERIN 50 mg in dextrose 5 % 250 mL (0.2 mg/mL) infusion (5 mcg/min Intravenous New Bag/Given 05/15/20 1248)  heparin bolus via infusion 4,000 Units (4,000 Units Intravenous Bolus from Bag 05/15/20 1254)    Followed by  heparin ADULT infusion 100 units/mL (25000 units/238mL) (1,300 Units/hr Intravenous New Bag/Given 05/15/20 1252)  aspirin chewable tablet 324 mg (has no administration in time range)    Or  aspirin suppository 300 mg (has no administration in time range)  aspirin EC tablet 81 mg (has no administration in time range)  nitroGLYCERIN (NITROSTAT) SL tablet 0.4 mg (has no administration in time range)  acetaminophen (TYLENOL) tablet 650 mg (has no administration in time range)  ondansetron (ZOFRAN) injection 4 mg (has no administration in time range)  albuterol (PROVENTIL) (2.5 MG/3ML) 0.083% nebulizer solution 3 mL (has no administration in time range)  atorvastatin (LIPITOR) tablet 20 mg (has no administration in time range)  clopidogrel (PLAVIX) tablet 75 mg (has no administration in time range)  famotidine (PEPCID) tablet 30 mg (has no administration in time range)  metoprolol tartrate (LOPRESSOR) tablet 12.5 mg (has no administration in time range)  losartan (COZAAR) tablet 50 mg (has no administration in time range)  predniSONE (DELTASONE) tablet 10 mg (has no administration in time range)    ED Course  I have reviewed the triage vital signs and the nursing notes.  Pertinent labs &  imaging results that were available during my care of the patient were reviewed by me and considered in my medical decision making (see chart for details).    MDM Rules/Calculators/A&P                         Patient with chest pain. Shortness of breath. Worse with exertion. States he cannot do the same activities he would otherwise do. He is  carrying more weight but chest x-ray does not show pneumonia/pulmonary edema. Doubt pneumothorax or pulmonary bruising. However with exertional chest pain and known cardiac history is worrisome for an unstable angina. Heparin and nitro started. Seen by Dr. Harl Bowie of cardiology who will admit patient.  Patient was positive for Covid on January 3.  Positive test today is likely residual from that time and still should not be infectious  CRITICAL CARE Performed by: Davonna Belling Total critical care time: 30 minutes Critical care time was exclusive of separately billable procedures and treating other patients. Critical care was necessary to treat or prevent imminent or life-threatening deterioration. Critical care was time spent personally by me on the following activities: development of treatment plan with patient and/or surrogate as well as nursing, discussions with consultants, evaluation of patient's response to treatment, examination of patient, obtaining history from patient or surrogate, ordering and performing treatments and interventions, ordering and review of laboratory studies, ordering and review of radiographic studies, pulse oximetry and re-evaluation of patient's condition. Final Clinical Impression(s) / ED Diagnoses Final diagnoses:  Chest pain, unspecified type    Rx / DC Orders ED Discharge Orders    None       Davonna Belling, MD 05/15/20 [place], MD 05/15/20 1535

## 2020-05-15 NOTE — H&P (Addendum)
Cardiology H&P:   Patient ID: Colin Lee MRN: [id number]; DOB: [birth year]  Admit date: 05/15/2020 Date of Consult: 05/15/2020  Primary Care Provider: [name], MD [facility] Cardiologist: Colin Him, MD  [place] Electrophysiologist:  None    Patient Profile:   Colin Lee is a 59 y.o. male with a hx of CAD who is being seen today for the evaluation of chest painat the request of Colin Lee.  History of Present Illness:   Colin Lee 60 yo  Male history of CAD with prior DES x 2 to RCA, pleural effsuion s/p talc pleurodesis, rheumatoid arthritis, COPD, presents with chest pain and DOE  Progressing symptosm over the last month. Sharp 8-9/10 pain mid to left chest that comes on with exertion, resolves with rest. Associated with SOB/DOE, occasionally palpitations. Symptoms last 5-10 minutes, resolve with rest. Both the severity of pain and DOE have been increasing over the last few weeks.    In ER started on hep gtt, nitro gtt.  ER vitals: p 82 bp 175/117 93% RA  WBC 7.5 Hgb 11.8 Plt 253 K 4 Cr 0.89 BUN 20 BNP 87 Trop 13--> EKG NSR, no ischemic changes CXR: chronic patchy reticular opacities stable  02/2019 echo LVEF 60-65%, no WMAs,  02/2019 nuclear stress: no ischemia  Past Medical History:  Diagnosis Date  . Acute febrile illness 10/20/2019  . Allergy   . Arthralgia 06/03/2014  . Arthritis   . Asthma   . Bacteremia 10/21/2019  . CAD (coronary artery disease) 06/20/2014  . Cellulitis and abscess of right leg with Bacteremia 10/21/2019  . Chest pain 05/31/2014  . Cholelithiases 10/26/2013  . Claudication ([place]) 04/15/2016  . COPD (chronic obstructive pulmonary disease) ([place]) 10/21/2019  . Coronary artery disease 2016   90% RCA and 30% LM S/P PCI of RCA  . DOE (dyspnea on exertion)    Quit smoking 2002 - Spirometry 03/29/2019  FEV1 2.25 (55%)  Ratio 0.74 with no resp to saba p ? Prior    Onset June 2020 with cards w/u neg 02/2019 and assoc with  atypical cp on prn ppi - 03/29/2019   Walked RA x two laps =  approx 535ft @ fast pace - stopped due to end of study with sats of 92% at the end of the study and mild sob  - 03/29/2019 max rx for GERD     . Elevated troponin   . GERD (gastroesophageal reflux disease)   . Headache    stress and tension  . History of nuclear stress test    Myoview 1/19: EF 61, no ischemia, low risk  . Hyperlipidemia   . Hypertension   . Melanoma ([place]) 11/24/2019   Right upper back. Superficial spreading. Breslow's 0.35mm. Clark's II  . Morbid obesity due to excess calories ([place]) 04/01/2019   Baseline wt around 250 when quit smoking 2002   . Neuromuscular disorder ([facility])    restless legs and leg cramps  . Normocytic anemia 06/03/2014  . Rash and nonspecific skin eruption   . Restless leg syndrome 12/28/2013  . Rheumatoid arthritis ([place]) 06/2019  . Shortness of breath    Starting May 2015  . Stented coronary artery   . 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 Tubular adenoma of colon 03/2013    Past Surgical History:  Procedure Laterality Date  . CHEST TUBE INSERTION Left 02/01/2014   Procedure: INSERTION PLEURAL DRAINAGE CATHETER;  Surgeon: Colin Isaac, MD;  Location: [facility];  Service:  Thoracic;  Laterality: Left;  . COLONOSCOPY    . COLONOSCOPY W/ BIOPSIES AND POLYPECTOMY  2014   benign  . LEFT HEART CATHETERIZATION WITH CORONARY ANGIOGRAM N/A 06/07/2014   Procedure: LEFT HEART CATHETERIZATION WITH CORONARY ANGIOGRAM;  Surgeon: Colin M Martinique, MD;  Location: [facility] CATH LAB;  Service: Cardiovascular;  Laterality: N/A;  . LEFT HEART CATHETERIZATION WITH CORONARY ANGIOGRAM N/A 06/08/2014   Procedure: LEFT HEART CATHETERIZATION WITH CORONARY ANGIOGRAM;  Surgeon: Colin Grooms, MD;  Location: [facility] CATH LAB;  Service: Cardiovascular;  Laterality: N/A;  . MASS EXCISION N/A 10/25/2019   Procedure: SKIN BIOPSY;  Surgeon: Colin Signs, MD;  Location: [facility];  Service: General;  Laterality: N/A;  . no  prior surgery    . PLEURAL BIOPSY Left 02/01/2014   Procedure: PLEURAL BIOPSY;  Surgeon: Colin Isaac, MD;  Location: [facility];  Service: Thoracic;  Laterality: Left;  . PLEURAL EFFUSION DRAINAGE Left 02/01/2014   Procedure: DRAINAGE OF PLEURAL EFFUSION;  Surgeon: Colin Isaac, MD;  Location: [place];  Service: Thoracic;  Laterality: Left;  . REMOVAL OF PLEURAL DRAINAGE CATHETER Left 02/16/2014   Procedure: REMOVAL OF PLEURAL DRAINAGE CATHETER;  Surgeon: Colin Isaac, MD;  Location: [place];  Service: Thoracic;  Laterality: Left;  . TALC PLEURODESIS Left 02/01/2014   Procedure: Colin Lee;  Surgeon: Colin Isaac, MD;  Location: [facility];  Service: Thoracic;  Laterality: Left;  . THORACENTESIS Left 2015  . VIDEO ASSISTED THORACOSCOPY Left 02/01/2014   Procedure: VIDEO ASSISTED THORACOSCOPY;  Surgeon: Colin Isaac, MD;  Location: [facility];  Service: Thoracic;  Laterality: Left;  Marland Kitchen VIDEO BRONCHOSCOPY N/A 02/01/2014   Procedure: VIDEO BRONCHOSCOPY;  Surgeon: Colin Isaac, MD;  Location: [facility];  Service: Thoracic;  Laterality: N/A;     Inpatient Medications: Scheduled Meds:  Continuous Infusions: . heparin 1,300 Units/hr (05/15/20 1252)  . nitroGLYCERIN 5 mcg/min (05/15/20 1248)   PRN Meds:   Allergies:    Allergies  Allergen Reactions  . Hydroxychloroquine Dermatitis    Skin Rash    Social History:   Social History   Socioeconomic History  . Marital status: Married    Spouse name: Not on file  . Number of children: Not on file  . Years of education: Not on file  . Highest education level: Not on file  Occupational History  . Not on file  Tobacco Use  . Smoking status: Former Smoker    Packs/day: 1.00    Years: 30.00    Pack years: 30.00    Types: Cigarettes    Quit date: 04/08/2000    Years since quitting: 20.1  . Smokeless tobacco: Never Used  Vaping Use  . Vaping Use: Never used  Substance and Sexual Activity  . Alcohol use: Yes  . Drug use:  No  . Sexual activity: Not on file  Other Topics Concern  . Not on file  Social History Narrative  . Not on file   Social Determinants of Health   Financial Resource Strain: Not on file  Food Insecurity: Not on file  Transportation Needs: Not on file  Physical Activity: Not on file  Stress: Not on file  Social Connections: Not on file  Intimate Partner Violence: Not on file    Family History:    Family History  Adopted: Yes  Problem Relation Age of Onset  . Colon cancer Neg Hx   . Esophageal cancer Neg Hx   . Rectal cancer Neg Hx   .  Stomach cancer Neg Hx      ROS:  Please see the history of present illness.  All other ROS reviewed and negative.     Physical Exam/Data:   Vitals:   05/15/20 1245 05/15/20 1302 05/15/20 1400 05/15/20 1420  BP:  (!) 142/85 119/77 (!) 162/93  Pulse: 86 83 77 85  Resp: 20 17 18 20   Temp:      TempSrc:      SpO2: 96% 98% 92% 98%  Weight:      Height:       No intake or output data in the 24 hours ending 05/15/20 1445 Last 3 Weights 05/15/2020 02/18/2020 11/10/2019  Weight (lbs) 318 lb 316 lb 9.6 oz 298 lb  Weight (kg) 144.244 kg 143.609 kg 135.172 kg     Body mass index is 45.63 kg/m.  General:  Well nourished, well developed, in no acute distress HEENT: normal Lymph: no adenopathy Neck: no JVD Endocrine:  No thryomegaly Vascular: No carotid bruits; FA pulses 2+ bilaterally without bruits  Cardiac:  normal S1, S2; RRR; no murmur  Lungs: mild coarse bilateral breath sounds Abd: soft, nontender, no hepatomegaly  Ext: no edema Musculoskeletal:  No deformities, BUE and BLE strength normal and equal Skin: warm and dry  Neuro:  CNs 2-12 intact, no focal abnormalities noted Psych:  Normal affect    Laboratory Data:  High Sensitivity Troponin:   Recent Labs  Lab 05/15/20 1215  TROPONINIHS 13     Chemistry Recent Labs  Lab 05/15/20 1215  NA 137  K 4.0  CL 105  CO2 25  GLUCOSE 168*  BUN 20  CREATININE 0.89  CALCIUM  9.0  GFRNONAA >60  ANIONGAP 7    No results for input(s): PROT, ALBUMIN, AST, ALT, ALKPHOS, BILITOT in the last 168 hours. Hematology Recent Labs  Lab 05/15/20 1215  WBC 7.5  RBC 4.28  HGB 11.8*  HCT 37.7*  MCV 88.1  MCH 27.6  MCHC 31.3  RDW 14.2  PLT 253   BNP Recent Labs  Lab 05/15/20 1215  BNP 87.0    DDimer No results for input(s): DDIMER in the last 168 hours.   Radiology/Studies:  DG Chest 2 View  Result Date: 05/15/2020 CLINICAL DATA:  Chest pain EXAM: CHEST - 2 VIEW COMPARISON:  10/20/2019 chest radiograph. FINDINGS: Stable cardiomediastinal silhouette with normal heart size. No pneumothorax. No pleural effusion. Mildly hyperinflated lungs. Chronic patchy reticular opacities throughout the peripheral lungs bilaterally, left greater than right, not appreciably changed. No superimposed acute consolidative airspace disease or pulmonary edema. IMPRESSION: 1. No acute cardiopulmonary disease. 2. Chronic patchy reticular opacities throughout the peripheral lungs bilaterally, left greater than right, not appreciably changed, compatible with nonspecific pulmonary fibrosis as seen on prior chest CT. Electronically Signed   By: Ilona Sorrel M.D.   On: 05/15/2020 12:57     Assessment and Plan:   1. CAD/Chest pain - thus far no objective evidence of ischemia by enzymes or EKG - subjectively he describes a story very suggestive of progressing unstable angina - will follow trops and EKG, f/u echo - at 317 lbs and BMI 45 noninvasive ischemic testing would be of limited value, particularly given his high pretest probablity given his known prior history of disease and typical symptoms - would plan for [facility] tomorrow for definitive evaluation. RHC given the degree of chronic dyspnea he has had without a clear primary etiology and risks for pulm HTN given his rheumatoid arthritis, smoking history, and obesity.   -  medical therapy with ASA, atorva, plavix (has been on at home),  losartan, metoprolol, nitro gtt, hep gtt   Addendum After initial consultation patient's COVID test came back + by pcp. He reports that he was positive Jan 3rd, at that time was symptomatic with loss of tasted and smell, fatigue. Symptoms resolved, he returned to work per standard protocols.   I discussed with internal medicine, PCR would be positive from infection last month. In this setting if asymptomatic no additional management or workup is indicated and also typically precautions are lifted after 10 days when asymptomatic and thus technically does not require hospital precautions. Given this would still plan for transfer and cath tomorrow.     46}      For questions or updates, please contact [place] Please consult [email or web address] for contact info under    Signed, Carlyle Dolly, MD  05/15/2020 2:45 PM

## 2020-05-15 NOTE — Telephone Encounter (Signed)
Pt c/o Shortness Of Breath: STAT if SOB developed within the last 24 hours or pt is noticeably SOB on the phone  1. Are you currently SOB (can you hear that pt is SOB on the phone)? Yes   2. How long have you been experiencing SOB? A month   3. Are you SOB when sitting or when up moving around? Moving around  4. Are you currently experiencing any other symptoms? Fast heart rate, chest pains, dizziness

## 2020-05-15 NOTE — Progress Notes (Signed)
ANTICOAGULATION CONSULT NOTE - Initial Consult  Pharmacy Consult for Heparin Indication: chest pain/ACS  Allergies  Allergen Reactions  . Hydroxychloroquine Dermatitis    Skin Rash    Patient Measurements: Height: 5\' 10"  (177.8 cm) Weight: (!) 144.2 kg (318 lb) IBW/kg (Calculated) : 73 HEPARIN DW (KG): 107.1  Vital Signs: Temp: 98.3 F (36.8 C) (02/07 1152) Temp Source: Oral (02/07 1152) BP: 156/88 (02/07 1230) Pulse Rate: 88 (02/07 1230)  Labs: Recent Labs    05/15/20 1215  HGB 11.8*  HCT 37.7*  PLT 253    CrCl cannot be calculated (Patient's most recent lab result is older than the maximum 21 days allowed.).   Medical History: Past Medical History:  Diagnosis Date  . Acute febrile illness 10/20/2019  . Allergy   . Arthralgia 06/03/2014  . Arthritis   . Asthma   . Bacteremia 10/21/2019  . CAD (coronary artery disease) 06/20/2014  . Cellulitis and abscess of right leg with Bacteremia 10/21/2019  . Chest pain 05/31/2014  . Cholelithiases 10/26/2013  . Claudication ([place]) 04/15/2016  . COPD (chronic obstructive pulmonary disease) ([place]) 10/21/2019  . Coronary artery disease 2016   90% RCA and 30% LM S/P PCI of RCA  . DOE (dyspnea on exertion)    Quit smoking 2002 - Spirometry 03/29/2019  FEV1 2.25 (55%)  Ratio 0.74 with no resp to saba p ? Prior    Onset June 2020 with cards w/u neg 02/2019 and assoc with atypical cp on prn ppi - 03/29/2019   Walked RA x two laps =  approx 547ft @ fast pace - stopped due to end of study with sats of 92% at the end of the study and mild sob  - 03/29/2019 max rx for GERD     . Elevated troponin   . GERD (gastroesophageal reflux disease)   . Headache    stress and tension  . History of nuclear stress test    Myoview 1/19: EF 61, no ischemia, low risk  . Hyperlipidemia   . Hypertension   . Melanoma ([place]) 11/24/2019   Right upper back. Superficial spreading. Breslow's 0.72mm. Clark's II  . Morbid obesity due to excess calories ([place])  04/01/2019   Baseline wt around 250 when quit smoking 2002   . Neuromuscular disorder (H[facility])    restless legs and leg cramps  . Normocytic anemia 06/03/2014  . Rash and nonspecific skin eruption   . Restless leg syndrome 12/28/2013  . Rheumatoid arthritis ([place]) 06/2019  . Shortness of breath    Starting May 2015  . Stented coronary artery   . 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 Tubular adenoma of colon 03/2013    Medications:  See med rec  Assessment: Patient presented to ED with chest pain for the past week. Now he is getting more SOB. Patient is not on any oral anticoagulants. Pharmacy asked to start heparin.  Goal of Therapy:  Heparin level 0.3-0.7 units/ml Monitor platelets by anticoagulation protocol: Yes   Plan:  Give 4000 units bolus x 1 Start heparin infusion at 1300 units/hr Check anti-Xa level in 6-8 hours and daily while on heparin Continue to monitor H&H and platelets  Isac Sarna, BS Vena [place], BCPS Clinical Pharmacist Pager [phone] 05/15/2020,12:33 PM

## 2020-05-15 NOTE — Progress Notes (Signed)
*  PRELIMINARY RESULTS* Echocardiogram 2D Echocardiogram has been performed with [facility].  Samuel Germany 05/15/2020, 3:50 PM

## 2020-05-15 NOTE — ED Notes (Signed)
Nurse spoke with EDP about patient tested positive for covid on Jan 3rd at [facility] testing place in [place], . [facility] verbalized by patient.

## 2020-05-15 NOTE — ED Notes (Signed)
Pt provided with juice and ice water per request. Pt informed that he is able to eat/drink until midnight and then he's cut off. Pt verbalized understanding. Pt denied any other needs at this time and appears to be in NAD. Call bell within reach, bed in low position. Will continue to monitor.

## 2020-05-15 NOTE — Telephone Encounter (Signed)
Spoke with the patient who reports that his SOB has been getting worse. He states that he gets SOB doing minimal activity such as putting his shoes on. He states that he has progressively been able to do less and less. He states that he has had some episodes of sharp chest pain and racing heart along with SOB. He has not taken any nitro. He reports that the chest pain gradually goes away when he rests. He states that yesterday his O2 dropped to 85% and heart rate up to 130bpm. He states that he also experienced some dizziness yesterday with SOB/chest pain. Current blood pressure is 207/107. Advised patient that I think he needs to go to the ER to be checked out Patient verbalized understanding.

## 2020-05-15 NOTE — ED Notes (Signed)
Pt able to eat and drink until midnight tonight.

## 2020-05-15 NOTE — ED Notes (Signed)
CRITICAL VALUE ALERT  Critical Value:  Positive for COVID  Date & Time Notied: 05/15/20 & 1530hrs  Provider Notified: EDP  Orders Received/Actions taken: Notified

## 2020-05-15 NOTE — ED Triage Notes (Signed)
Pt to er, pt states that he has had chest pain for the past week, states that it has been getting worse, states that now when he is walking he gets more short of breath

## 2020-05-16 ENCOUNTER — Encounter (HOSPITAL_COMMUNITY)
Admission: EM | Disposition: A | Discharge status: Home / Self Care | Payer: Self-pay | Source: Home / Self Care | Attending: Emergency Medicine

## 2020-05-16 ENCOUNTER — Encounter (HOSPITAL_COMMUNITY): Payer: Self-pay | Admitting: Cardiovascular Disease

## 2020-05-16 DIAGNOSIS — Z87891 Personal history of nicotine dependence: Secondary | ICD-10-CM | POA: Diagnosis not present

## 2020-05-16 DIAGNOSIS — R079 Chest pain, unspecified: Secondary | ICD-10-CM

## 2020-05-16 DIAGNOSIS — U071 COVID-19: Secondary | ICD-10-CM | POA: Diagnosis not present

## 2020-05-16 DIAGNOSIS — J449 Chronic obstructive pulmonary disease, unspecified: Secondary | ICD-10-CM | POA: Diagnosis not present

## 2020-05-16 DIAGNOSIS — J849 Interstitial pulmonary disease, unspecified: Secondary | ICD-10-CM | POA: Diagnosis not present

## 2020-05-16 DIAGNOSIS — C439 Malignant melanoma of skin, unspecified: Secondary | ICD-10-CM | POA: Diagnosis not present

## 2020-05-16 DIAGNOSIS — R0602 Shortness of breath: Secondary | ICD-10-CM | POA: Diagnosis not present

## 2020-05-16 DIAGNOSIS — Z79899 Other long term (current) drug therapy: Secondary | ICD-10-CM | POA: Diagnosis not present

## 2020-05-16 DIAGNOSIS — R0789 Other chest pain: Secondary | ICD-10-CM

## 2020-05-16 DIAGNOSIS — E785 Hyperlipidemia, unspecified: Secondary | ICD-10-CM | POA: Diagnosis not present

## 2020-05-16 DIAGNOSIS — I10 Essential (primary) hypertension: Secondary | ICD-10-CM | POA: Diagnosis not present

## 2020-05-16 DIAGNOSIS — I2510 Atherosclerotic heart disease of native coronary artery without angina pectoris: Secondary | ICD-10-CM | POA: Diagnosis not present

## 2020-05-16 DIAGNOSIS — R0902 Hypoxemia: Secondary | ICD-10-CM | POA: Diagnosis not present

## 2020-05-16 DIAGNOSIS — Z6841 Body Mass Index (BMI) 40.0 and over, adult: Secondary | ICD-10-CM | POA: Diagnosis not present

## 2020-05-16 HISTORY — PX: RIGHT/LEFT HEART CATH AND CORONARY ANGIOGRAPHY: CATH118266

## 2020-05-16 LAB — POCT I-STAT EG7
Acid-Base Excess: 4 mmol/L — ABNORMAL HIGH (ref 0.0–2.0)
Acid-Base Excess: 5 mmol/L — ABNORMAL HIGH (ref 0.0–2.0)
Bicarbonate: 30.5 mmol/L — ABNORMAL HIGH (ref 20.0–28.0)
Bicarbonate: 31.3 mmol/L — ABNORMAL HIGH (ref 20.0–28.0)
Calcium, Ion: 1.23 mmol/L (ref 1.15–1.40)
Calcium, Ion: 1.26 mmol/L (ref 1.15–1.40)
HCT: 37 % — ABNORMAL LOW (ref 39.0–52.0)
HCT: 38 % — ABNORMAL LOW (ref 39.0–52.0)
Hemoglobin: 12.6 g/dL — ABNORMAL LOW (ref 13.0–17.0)
Hemoglobin: 12.9 g/dL — ABNORMAL LOW (ref 13.0–17.0)
O2 Saturation: 74 %
O2 Saturation: 76 %
Potassium: 4.1 mmol/L (ref 3.5–5.1)
Potassium: 4.2 mmol/L (ref 3.5–5.1)
Sodium: 139 mmol/L (ref 135–145)
Sodium: 140 mmol/L (ref 135–145)
TCO2: 32 mmol/L (ref 22–32)
TCO2: 33 mmol/L — ABNORMAL HIGH (ref 22–32)
pCO2, Ven: 52.8 mmHg (ref 44.0–60.0)
pCO2, Ven: 54.1 mmHg (ref 44.0–60.0)
pH, Ven: 7.369 (ref 7.250–7.430)
pH, Ven: 7.37 (ref 7.250–7.430)
pO2, Ven: 41 mmHg (ref 32.0–45.0)
pO2, Ven: 43 mmHg (ref 32.0–45.0)

## 2020-05-16 LAB — POCT I-STAT 7, (LYTES, BLD GAS, ICA,H+H)
Acid-Base Excess: 4 mmol/L — ABNORMAL HIGH (ref 0.0–2.0)
Bicarbonate: 29.5 mmol/L — ABNORMAL HIGH (ref 20.0–28.0)
Calcium, Ion: 1.24 mmol/L (ref 1.15–1.40)
HCT: 34 % — ABNORMAL LOW (ref 39.0–52.0)
Hemoglobin: 11.6 g/dL — ABNORMAL LOW (ref 13.0–17.0)
O2 Saturation: 99 %
Potassium: 4 mmol/L (ref 3.5–5.1)
Sodium: 137 mmol/L (ref 135–145)
TCO2: 31 mmol/L (ref 22–32)
pCO2 arterial: 50.1 mmHg — ABNORMAL HIGH (ref 32.0–48.0)
pH, Arterial: 7.379 (ref 7.350–7.450)
pO2, Arterial: 135 mmHg — ABNORMAL HIGH (ref 83.0–108.0)

## 2020-05-16 LAB — CBC
HCT: 38.3 % — ABNORMAL LOW (ref 39.0–52.0)
Hemoglobin: 11.9 g/dL — ABNORMAL LOW (ref 13.0–17.0)
MCH: 27.4 pg (ref 26.0–34.0)
MCHC: 31.1 g/dL (ref 30.0–36.0)
MCV: 88.2 fL (ref 80.0–100.0)
Platelets: 262 10*3/uL (ref 150–400)
RBC: 4.34 MIL/uL (ref 4.22–5.81)
RDW: 14.2 % (ref 11.5–15.5)
WBC: 9.4 10*3/uL (ref 4.0–10.5)
nRBC: 0 % (ref 0.0–0.2)

## 2020-05-16 LAB — HEPARIN LEVEL (UNFRACTIONATED)
Heparin Unfractionated: 0.28 IU/mL — ABNORMAL LOW (ref 0.30–0.70)
Heparin Unfractionated: 0.5 IU/mL (ref 0.30–0.70)
Heparin Unfractionated: 0.49 IU/mL (ref 0.30–0.70)

## 2020-05-16 SURGERY — RIGHT/LEFT HEART CATH AND CORONARY ANGIOGRAPHY
Anesthesia: LOCAL

## 2020-05-16 MED ORDER — FENTANYL CITRATE (PF) 100 MCG/2ML IJ SOLN
INTRAMUSCULAR | Status: DC | PRN
  Administered 2020-05-16: 50 ug via INTRAVENOUS

## 2020-05-16 MED ORDER — LIDOCAINE HCL (PF) 1 % IJ SOLN
INTRAMUSCULAR | Status: DC | PRN
  Administered 2020-05-16 (×2): 2 mL

## 2020-05-16 MED ORDER — ONDANSETRON HCL 4 MG/2ML IJ SOLN: 4.0000 mg | Freq: Four times a day (QID) | INTRAMUSCULAR | Status: DC | PRN

## 2020-05-16 MED ORDER — SODIUM CHLORIDE 0.9 % IV SOLN
250.0000 mL | INTRAVENOUS | Status: DC | PRN
Start: 2020-05-16 — End: 2020-05-18

## 2020-05-16 MED ORDER — FENTANYL CITRATE (PF) 100 MCG/2ML IJ SOLN
INTRAMUSCULAR | Status: AC
  Filled 2020-05-16: qty 2

## 2020-05-16 MED ORDER — MIDAZOLAM HCL 2 MG/2ML IJ SOLN
INTRAMUSCULAR | Status: DC | PRN
  Administered 2020-05-16: 2 mg via INTRAVENOUS

## 2020-05-16 MED ORDER — IOHEXOL 350 MG/ML SOLN
INTRAVENOUS | Status: DC | PRN
  Administered 2020-05-16: 70 mL

## 2020-05-16 MED ORDER — MIDAZOLAM HCL 2 MG/2ML IJ SOLN
INTRAMUSCULAR | Status: AC
  Filled 2020-05-16: qty 2

## 2020-05-16 MED ORDER — VERAPAMIL HCL 2.5 MG/ML IV SOLN
INTRAVENOUS | Status: DC | PRN
  Administered 2020-05-16: 10 mL via INTRA_ARTERIAL

## 2020-05-16 MED ORDER — LIDOCAINE HCL (PF) 1 % IJ SOLN
INTRAMUSCULAR | Status: AC
  Filled 2020-05-16: qty 30

## 2020-05-16 MED ORDER — HEPARIN (PORCINE) IN NACL 1000-0.9 UT/500ML-% IV SOLN
INTRAVENOUS | Status: AC
  Filled 2020-05-16: qty 1000

## 2020-05-16 MED ORDER — HEPARIN (PORCINE) IN NACL 1000-0.9 UT/500ML-% IV SOLN
INTRAVENOUS | Status: DC | PRN
  Administered 2020-05-16 (×3): 500 mL

## 2020-05-16 MED ORDER — SODIUM CHLORIDE 0.9 % IV SOLN: 250.0000 mL | INTRAVENOUS | Status: DC | PRN

## 2020-05-16 MED ORDER — SODIUM CHLORIDE 0.9% FLUSH: 3.0000 mL | INTRAVENOUS | Status: DC | PRN

## 2020-05-16 MED ORDER — LABETALOL HCL 5 MG/ML IV SOLN: 10.0000 mg | INTRAVENOUS | Status: DC | PRN

## 2020-05-16 MED ORDER — SODIUM CHLORIDE 0.9 % IV SOLN: INTRAVENOUS | Status: DC

## 2020-05-16 MED ORDER — ASPIRIN 81 MG PO CHEW: 81.0000 mg | CHEWABLE_TABLET | ORAL | Status: DC

## 2020-05-16 MED ORDER — SODIUM CHLORIDE 0.9% FLUSH
3.0000 mL | Freq: Two times a day (BID) | INTRAVENOUS | Status: DC
  Administered 2020-05-16 (×2): 3 mL via INTRAVENOUS

## 2020-05-16 MED ORDER — HYDRALAZINE HCL 20 MG/ML IJ SOLN: 10.0000 mg | INTRAMUSCULAR | Status: DC | PRN

## 2020-05-16 MED ORDER — HEPARIN SODIUM (PORCINE) 1000 UNIT/ML IJ SOLN
INTRAMUSCULAR | Status: AC
  Filled 2020-05-16: qty 1

## 2020-05-16 MED ORDER — VERAPAMIL HCL 2.5 MG/ML IV SOLN
INTRAVENOUS | Status: AC
  Filled 2020-05-16: qty 2

## 2020-05-16 MED ORDER — SODIUM CHLORIDE 0.9% FLUSH
3.0000 mL | Freq: Two times a day (BID) | INTRAVENOUS | Status: DC
  Administered 2020-05-16 – 2020-05-18 (×4): 3 mL via INTRAVENOUS

## 2020-05-16 MED ORDER — ASPIRIN 81 MG PO CHEW
81.0000 mg | CHEWABLE_TABLET | ORAL | Status: AC
  Administered 2020-05-16: 81 mg via ORAL
  Filled 2020-05-16: qty 1

## 2020-05-16 MED ORDER — ACETAMINOPHEN 325 MG PO TABS
650.0000 mg | ORAL_TABLET | ORAL | Status: DC | PRN
  Administered 2020-05-16 – 2020-05-17 (×3): 650 mg via ORAL
  Filled 2020-05-16 (×3): qty 2

## 2020-05-16 MED ORDER — ATORVASTATIN CALCIUM 10 MG PO TABS: 20.0000 mg | ORAL_TABLET | Freq: Every day | ORAL | Status: DC

## 2020-05-16 MED ORDER — HEPARIN SODIUM (PORCINE) 1000 UNIT/ML IJ SOLN
INTRAMUSCULAR | Status: DC | PRN
  Administered 2020-05-16: 7000 [IU] via INTRAVENOUS

## 2020-05-16 MED ORDER — DIAZEPAM 2 MG PO TABS: 5.0000 mg | ORAL_TABLET | Freq: Four times a day (QID) | ORAL | Status: DC | PRN

## 2020-05-16 SURGICAL SUPPLY — 15 items
BAG SNAP BAND KOVER 36X36 (MISCELLANEOUS) ×2 IMPLANT
CATH BALLN WEDGE 5F 110CM (CATHETERS) ×2 IMPLANT
CATH OPTITORQUE TIG 4.0 5F (CATHETERS) ×2 IMPLANT
COVER DOME SNAP 22 D (MISCELLANEOUS) ×2 IMPLANT
DEVICE RAD COMP TR BAND LRG (VASCULAR PRODUCTS) ×2 IMPLANT
GLIDESHEATH SLEND SS 6F .021 (SHEATH) ×2 IMPLANT
GUIDEWIRE .025 260CM (WIRE) ×2 IMPLANT
GUIDEWIRE INQWIRE 1.5J.035X260 (WIRE) ×1 IMPLANT
INQWIRE 1.5J .035X260CM (WIRE) ×2
KIT HEART LEFT (KITS) ×2 IMPLANT
MAT PREVALON FULL STRYKER (MISCELLANEOUS) ×2 IMPLANT
PACK CARDIAC CATHETERIZATION (CUSTOM PROCEDURE TRAY) ×2 IMPLANT
SHEATH GLIDE SLENDER 4/5FR (SHEATH) ×2 IMPLANT
TRANSDUCER W/STOPCOCK (MISCELLANEOUS) ×2 IMPLANT
TUBING CIL FLEX 10 FLL-RA (TUBING) ×2 IMPLANT

## 2020-05-16 NOTE — H&P (View-Only) (Signed)
Progress Note  Patient Name: Colin Lee Date of Encounter: 05/16/2020  [facility] Cardiologist: Fransico Him, MD   Subjective   No chest pain, no SOB  Inpatient Medications    Scheduled Meds: . aspirin  324 mg Oral NOW  . [START ON 05/17/2020] aspirin  81 mg Oral Pre-Cath  . aspirin EC  81 mg Oral Daily  . atorvastatin  20 mg Oral q morning  . clopidogrel  75 mg Oral Daily  . famotidine  30 mg Oral QHS  . losartan  50 mg Oral Daily  . metoprolol tartrate  12.5 mg Oral BID  . predniSONE  10 mg Oral BID WC  . sodium chloride flush  3 mL Intravenous Q12H   Continuous Infusions: . sodium chloride    . [START ON 05/17/2020] sodium chloride    . heparin 1,500 Units/hr (05/16/20 0424)  . nitroGLYCERIN 5 mcg/min (05/15/20 2054)   PRN Meds: sodium chloride, acetaminophen, albuterol, nitroGLYCERIN, ondansetron (ZOFRAN) IV, sodium chloride flush   Vital Signs    Vitals:   05/16/20 0400 05/16/20 0430 05/16/20 0500 05/16/20 0530  BP: (!) 159/105 (!) 170/71 (!) 144/83 (!) 146/73  Pulse: 66 92 72 71  Resp: 11 (!) 26 11 11   Temp:      TempSrc:      SpO2: 94% 93% 95% 93%  Weight:      Height:        Intake/Output Summary (Last 24 hours) at 05/16/2020 0748 Last data filed at 05/15/2020 2054 Gross per 24 hour  Intake 153.35 ml  Output --  Net 153.35 ml   Last 3 Weights 05/15/2020 02/18/2020 11/10/2019  Weight (lbs) 318 lb 316 lb 9.6 oz 298 lb  Weight (kg) 144.244 kg 143.609 kg 135.172 kg      Telemetry    SR - Personally Reviewed  ECG    n/a - Personally Reviewed  Physical Exam   GEN: No acute distress.   Neck: No JVD Cardiac: RRR, no murmurs, rubs, or gallops.  Respiratory: Clear to auscultation bilaterally. GI: Soft, nontender, non-distended  MS: No edema; No deformity. Neuro:  Nonfocal  Psych: Normal affect   Labs    High Sensitivity Troponin:   Recent Labs  Lab 05/15/20 1215 05/15/20 1433  TROPONINIHS 13 11      Chemistry Recent Labs  Lab  05/15/20 1215  NA 137  K 4.0  CL 105  CO2 25  GLUCOSE 168*  BUN 20  CREATININE 0.89  CALCIUM 9.0  GFRNONAA >60  ANIONGAP 7     Hematology Recent Labs  Lab 05/15/20 1215 05/16/20 0038  WBC 7.5 9.4  RBC 4.28 4.34  HGB 11.8* 11.9*  HCT 37.7* 38.3*  MCV 88.1 88.2  MCH 27.6 27.4  MCHC 31.3 31.1  RDW 14.2 14.2  PLT 253 262    BNP Recent Labs  Lab 05/15/20 1215  BNP 87.0     DDimer No results for input(s): DDIMER in the last 168 hours.   Radiology    DG Chest 2 View  Result Date: 05/15/2020 CLINICAL DATA:  Chest pain EXAM: CHEST - 2 VIEW COMPARISON:  10/20/2019 chest radiograph. FINDINGS: Stable cardiomediastinal silhouette with normal heart size. No pneumothorax. No pleural effusion. Mildly hyperinflated lungs. Chronic patchy reticular opacities throughout the peripheral lungs bilaterally, left greater than right, not appreciably changed. No superimposed acute consolidative airspace disease or pulmonary edema. IMPRESSION: 1. No acute cardiopulmonary disease. 2. Chronic patchy reticular opacities throughout the peripheral lungs bilaterally, left  greater than right, not appreciably changed, compatible with nonspecific pulmonary fibrosis as seen on prior chest CT. Electronically Signed   By: Ilona Sorrel M.D.   On: 05/15/2020 12:57   ECHOCARDIOGRAM COMPLETE  Result Date: 05/15/2020    ECHOCARDIOGRAM REPORT   Patient Name:   Colin Lee Date of Exam: 05/15/2020 Medical Rec #:  [id number]           Height:       70.0 in Accession #:    [id number]          Weight:       318.0 lb Date of Birth:  1960-04-18           BSA:          2.542 m Patient Age:    60 years            BP:           142/85 mmHg Patient Gender: M                   HR:           83 bpm. Exam Location:  [facility] Procedure: 2D Echo, Cardiac Doppler and Color Doppler Indications:    Chest Pain R07.9  History:        Patient has prior history of Echocardiogram examinations, most                 recent 03/03/2019.  CAD; Risk Factors:Hypertension and                 Dyslipidemia. Morbid obesity due to excess calories, GERD,                 Covid-19.  Sonographer:    Alvino Chapel RCS Referring Phys: [id number] [place]  1. Left ventricular ejection fraction, by estimation, is 60 to 65%. The left ventricle has normal function. The left ventricle has no regional wall motion abnormalities. There is mild left ventricular hypertrophy. Left ventricular diastolic parameters are consistent with Grade I diastolic dysfunction (impaired relaxation).  2. Right ventricular systolic function is normal. The right ventricular size is normal.  3. Left atrial size was mild to moderately dilated.  4. The mitral valve is normal in structure. No evidence of mitral valve regurgitation. No evidence of mitral stenosis.  5. The aortic valve is tricuspid. There is mild calcification of the aortic valve. There is mild thickening of the aortic valve. Aortic valve regurgitation is not visualized. No aortic stenosis is present. FINDINGS  Left Ventricle: Left ventricular ejection fraction, by estimation, is 60 to 65%. The left ventricle has normal function. The left ventricle has no regional wall motion abnormalities. The left ventricular internal cavity size was normal in size. There is  mild left ventricular hypertrophy. Left ventricular diastolic parameters are consistent with Grade I diastolic dysfunction (impaired relaxation). Normal left ventricular filling pressure. Right Ventricle: The right ventricular size is normal. No increase in right ventricular wall thickness. Right ventricular systolic function is normal. Left Atrium: Left atrial size was mild to moderately dilated. Right Atrium: Right atrial size was normal in size. Pericardium: There is no evidence of pericardial effusion. Mitral Valve: The mitral valve is normal in structure. No evidence of mitral valve regurgitation. No evidence of mitral valve stenosis. Tricuspid Valve:  The tricuspid valve is normal in structure. Tricuspid valve regurgitation is not demonstrated. No evidence of tricuspid stenosis. Aortic Valve: The aortic valve is tricuspid. There is  mild calcification of the aortic valve. There is mild thickening of the aortic valve. There is mild aortic valve annular calcification. Aortic valve regurgitation is not visualized. No aortic stenosis  is present. Aortic valve mean gradient measures 6.3 mmHg. Aortic valve peak gradient measures 12.0 mmHg. Aortic valve area, by VTI measures 3.03 cm. Pulmonic Valve: The pulmonic valve was not well visualized. Pulmonic valve regurgitation is not visualized. No evidence of pulmonic stenosis. Aorta: The aortic root is normal in size and structure. IAS/Shunts: The interatrial septum was not well visualized.  LEFT VENTRICLE PLAX 2D LVIDd:         5.50 cm  Diastology LVIDs:         2.40 cm  LV e' medial:    6.64 cm/s LV PW:         1.20 cm  LV E/e' medial:  10.5 LV IVS:        1.00 cm  LV e' lateral:   10.00 cm/s LVOT diam:     2.10 cm  LV E/e' lateral: 7.0 LV SV:         89 LV SV Index:   35 LVOT Area:     3.46 cm  RIGHT VENTRICLE RV S prime:     18.30 cm/s TAPSE (M-mode): 2.2 cm LEFT ATRIUM              Index       RIGHT ATRIUM           Index LA diam:        4.00 cm  1.57 cm/m  RA Area:     11.80 cm LA Vol (A2C):   86.9 ml  34.19 ml/m RA Volume:   23.00 ml  9.05 ml/m LA Vol (A4C):   101.0 ml 39.73 ml/m LA Biplane Vol: 95.5 ml  37.57 ml/m  AORTIC VALVE AV Area (Vmax):    2.30 cm AV Area (Vmean):   2.41 cm AV Area (VTI):     3.03 cm AV Vmax:           173.20 cm/s AV Vmean:          117.602 cm/s AV VTI:            0.294 m AV Peak Grad:      12.0 mmHg AV Mean Grad:      6.3 mmHg LVOT Vmax:         115.00 cm/s LVOT Vmean:        81.700 cm/s LVOT VTI:          0.257 m LVOT/AV VTI ratio: 0.87  AORTA Ao Root diam: 3.80 cm MITRAL VALVE MV Area (PHT): 2.10 cm    SHUNTS MV Decel Time: 361 msec    Systemic VTI:  0.26 m MV E velocity: 69.90  cm/s  Systemic Diam: 2.10 cm MV A velocity: 84.70 cm/s MV E/A ratio:  0.83 Carlyle Dolly MD Electronically signed by Carlyle Dolly MD Signature Date/Time: 05/15/2020/4:35:00 PM    Final     Cardiac Studies     Patient Profile     CHI GARLOW is a 60 y.o. male with a hx of CAD who is being seen today for the evaluation of chest painat the request of Dr Alvino Chapel.  Assessment & Plan    1. CAD/Chest pain - thus far no objective evidence of ischemia by enzymes or EKG, echo with normal LVEF - subjectively he describes a story very suggestive of progressing unstable angina - at 317  lbs and BMI 45 noninvasive ischemic testing would be of limited value, particularly given his high pretest probablity given his known prior history of disease and typical symptoms - would plan for [facility] tomorrow for definitive evaluation. RHC given the degree of chronic dyspnea he has had without a clear primary etiology and risks for pulm HTN given his rheumatoid arthritis, smoking history, and obesity.   - medical therapy with ASA, atorva, plavix (has been on at home), losartan, metoprolol, nitro gtt, hep gtt   2. COVID + test After initial consultation patient's COVID test came back + by pcp. He reports that he was positive Jan 3rd, at that time was symptomatic with loss of taste and smell, fatigue. Symptoms resolved after about 1 week, he subsequently returned to work per standard protocols.   - I discussed with internal medicine, PCR would still be positive from infection last month. In this setting if asymptomatic no additional management or workup is indicated and also typically hospital precautions are lifted after 10 days when asymptomatic and thus technically does not require hospital precautions. Given this would still plan for transfer and cath tomorrow.    3. Rheumatoid arthritis - continue prednisone - given long history of persistent DOE and inability to measure PASP by echo will ask  for RHC during his cath as well given risks for pulmonary HTN    For questions or updates, please contact [place] Please consult [email or web address] for contact info under        Signed, Carlyle Dolly, MD  05/16/2020, 7:48 AM

## 2020-05-16 NOTE — ED Notes (Signed)
Pt's sleeping

## 2020-05-16 NOTE — Plan of Care (Signed)
?  Problem: Clinical Measurements: ?Goal: Ability to maintain clinical measurements within normal limits will improve ?Outcome: Progressing ?Goal: Will remain free from infection ?Outcome: Progressing ?Goal: Diagnostic test results will improve ?Outcome: Progressing ?  ?

## 2020-05-16 NOTE — Progress Notes (Signed)
Progress Note  Patient Name: Colin Lee Date of Encounter: 05/16/2020  [facility] Cardiologist: Fransico Him, MD   Subjective   No chest pain, no SOB  Inpatient Medications    Scheduled Meds: . aspirin  324 mg Oral NOW  . [START ON 05/17/2020] aspirin  81 mg Oral Pre-Cath  . aspirin EC  81 mg Oral Daily  . atorvastatin  20 mg Oral q morning  . clopidogrel  75 mg Oral Daily  . famotidine  30 mg Oral QHS  . losartan  50 mg Oral Daily  . metoprolol tartrate  12.5 mg Oral BID  . predniSONE  10 mg Oral BID WC  . sodium chloride flush  3 mL Intravenous Q12H   Continuous Infusions: . sodium chloride    . [START ON 05/17/2020] sodium chloride    . heparin 1,500 Units/hr (05/16/20 0424)  . nitroGLYCERIN 5 mcg/min (05/15/20 2054)   PRN Meds: sodium chloride, acetaminophen, albuterol, nitroGLYCERIN, ondansetron (ZOFRAN) IV, sodium chloride flush   Vital Signs    Vitals:   05/16/20 0400 05/16/20 0430 05/16/20 0500 05/16/20 0530  BP: (!) 159/105 (!) 170/71 (!) 144/83 (!) 146/73  Pulse: 66 92 72 71  Resp: 11 (!) 26 11 11   Temp:      TempSrc:      SpO2: 94% 93% 95% 93%  Weight:      Height:        Intake/Output Summary (Last 24 hours) at 05/16/2020 0748 Last data filed at 05/15/2020 2054 Gross per 24 hour  Intake 153.35 ml  Output --  Net 153.35 ml   Last 3 Weights 05/15/2020 02/18/2020 11/10/2019  Weight (lbs) 318 lb 316 lb 9.6 oz 298 lb  Weight (kg) 144.244 kg 143.609 kg 135.172 kg      Telemetry    SR - Personally Reviewed  ECG    n/a - Personally Reviewed  Physical Exam   GEN: No acute distress.   Neck: No JVD Cardiac: RRR, no murmurs, rubs, or gallops.  Respiratory: Clear to auscultation bilaterally. GI: Soft, nontender, non-distended  MS: No edema; No deformity. Neuro:  Nonfocal  Psych: Normal affect   Labs    High Sensitivity Troponin:   Recent Labs  Lab 05/15/20 1215 05/15/20 1433  TROPONINIHS 13 11      Chemistry Recent Labs  Lab  05/15/20 1215  NA 137  K 4.0  CL 105  CO2 25  GLUCOSE 168*  BUN 20  CREATININE 0.89  CALCIUM 9.0  GFRNONAA >60  ANIONGAP 7     Hematology Recent Labs  Lab 05/15/20 1215 05/16/20 0038  WBC 7.5 9.4  RBC 4.28 4.34  HGB 11.8* 11.9*  HCT 37.7* 38.3*  MCV 88.1 88.2  MCH 27.6 27.4  MCHC 31.3 31.1  RDW 14.2 14.2  PLT 253 262    BNP Recent Labs  Lab 05/15/20 1215  BNP 87.0     DDimer No results for input(s): DDIMER in the last 168 hours.   Radiology    DG Chest 2 View  Result Date: 05/15/2020 CLINICAL DATA:  Chest pain EXAM: CHEST - 2 VIEW COMPARISON:  10/20/2019 chest radiograph. FINDINGS: Stable cardiomediastinal silhouette with normal heart size. No pneumothorax. No pleural effusion. Mildly hyperinflated lungs. Chronic patchy reticular opacities throughout the peripheral lungs bilaterally, left greater than right, not appreciably changed. No superimposed acute consolidative airspace disease or pulmonary edema. IMPRESSION: 1. No acute cardiopulmonary disease. 2. Chronic patchy reticular opacities throughout the peripheral lungs bilaterally, left  greater than right, not appreciably changed, compatible with nonspecific pulmonary fibrosis as seen on prior chest CT. Electronically Signed   By: Ilona Sorrel M.D.   On: 05/15/2020 12:57   ECHOCARDIOGRAM COMPLETE  Result Date: 05/15/2020    ECHOCARDIOGRAM REPORT   Patient Name:   Colin Lee Date of Exam: 05/15/2020 Medical Rec #:  [id number]           Height:       70.0 in Accession #:    [id number]          Weight:       318.0 lb Date of Birth:  04-Feb-1961           BSA:          2.542 m Patient Age:    60 years            BP:           142/85 mmHg Patient Gender: M                   HR:           83 bpm. Exam Location:  [facility] Procedure: 2D Echo, Cardiac Doppler and Color Doppler Indications:    Chest Pain R07.9  History:        Patient has prior history of Echocardiogram examinations, most                 recent 03/03/2019.  CAD; Risk Factors:Hypertension and                 Dyslipidemia. Morbid obesity due to excess calories, GERD,                 Covid-19.  Sonographer:    Alvino Chapel RCS Referring Phys: [id number] [place]  1. Left ventricular ejection fraction, by estimation, is 60 to 65%. The left ventricle has normal function. The left ventricle has no regional wall motion abnormalities. There is mild left ventricular hypertrophy. Left ventricular diastolic parameters are consistent with Grade I diastolic dysfunction (impaired relaxation).  2. Right ventricular systolic function is normal. The right ventricular size is normal.  3. Left atrial size was mild to moderately dilated.  4. The mitral valve is normal in structure. No evidence of mitral valve regurgitation. No evidence of mitral stenosis.  5. The aortic valve is tricuspid. There is mild calcification of the aortic valve. There is mild thickening of the aortic valve. Aortic valve regurgitation is not visualized. No aortic stenosis is present. FINDINGS  Left Ventricle: Left ventricular ejection fraction, by estimation, is 60 to 65%. The left ventricle has normal function. The left ventricle has no regional wall motion abnormalities. The left ventricular internal cavity size was normal in size. There is  mild left ventricular hypertrophy. Left ventricular diastolic parameters are consistent with Grade I diastolic dysfunction (impaired relaxation). Normal left ventricular filling pressure. Right Ventricle: The right ventricular size is normal. No increase in right ventricular wall thickness. Right ventricular systolic function is normal. Left Atrium: Left atrial size was mild to moderately dilated. Right Atrium: Right atrial size was normal in size. Pericardium: There is no evidence of pericardial effusion. Mitral Valve: The mitral valve is normal in structure. No evidence of mitral valve regurgitation. No evidence of mitral valve stenosis. Tricuspid Valve:  The tricuspid valve is normal in structure. Tricuspid valve regurgitation is not demonstrated. No evidence of tricuspid stenosis. Aortic Valve: The aortic valve is tricuspid. There is  mild calcification of the aortic valve. There is mild thickening of the aortic valve. There is mild aortic valve annular calcification. Aortic valve regurgitation is not visualized. No aortic stenosis  is present. Aortic valve mean gradient measures 6.3 mmHg. Aortic valve peak gradient measures 12.0 mmHg. Aortic valve area, by VTI measures 3.03 cm. Pulmonic Valve: The pulmonic valve was not well visualized. Pulmonic valve regurgitation is not visualized. No evidence of pulmonic stenosis. Aorta: The aortic root is normal in size and structure. IAS/Shunts: The interatrial septum was not well visualized.  LEFT VENTRICLE PLAX 2D LVIDd:         5.50 cm  Diastology LVIDs:         2.40 cm  LV e' medial:    6.64 cm/s LV PW:         1.20 cm  LV E/e' medial:  10.5 LV IVS:        1.00 cm  LV e' lateral:   10.00 cm/s LVOT diam:     2.10 cm  LV E/e' lateral: 7.0 LV SV:         89 LV SV Index:   35 LVOT Area:     3.46 cm  RIGHT VENTRICLE RV S prime:     18.30 cm/s TAPSE (M-mode): 2.2 cm LEFT ATRIUM              Index       RIGHT ATRIUM           Index LA diam:        4.00 cm  1.57 cm/m  RA Area:     11.80 cm LA Vol (A2C):   86.9 ml  34.19 ml/m RA Volume:   23.00 ml  9.05 ml/m LA Vol (A4C):   101.0 ml 39.73 ml/m LA Biplane Vol: 95.5 ml  37.57 ml/m  AORTIC VALVE AV Area (Vmax):    2.30 cm AV Area (Vmean):   2.41 cm AV Area (VTI):     3.03 cm AV Vmax:           173.20 cm/s AV Vmean:          117.602 cm/s AV VTI:            0.294 m AV Peak Grad:      12.0 mmHg AV Mean Grad:      6.3 mmHg LVOT Vmax:         115.00 cm/s LVOT Vmean:        81.700 cm/s LVOT VTI:          0.257 m LVOT/AV VTI ratio: 0.87  AORTA Ao Root diam: 3.80 cm MITRAL VALVE MV Area (PHT): 2.10 cm    SHUNTS MV Decel Time: 361 msec    Systemic VTI:  0.26 m MV E velocity: 69.90  cm/s  Systemic Diam: 2.10 cm MV A velocity: 84.70 cm/s MV E/A ratio:  0.83 Carlyle Dolly MD Electronically signed by Carlyle Dolly MD Signature Date/Time: 05/15/2020/4:35:00 PM    Final     Cardiac Studies     Patient Profile     RHONDA VANGIESON is a 60 y.o. male with a hx of CAD who is being seen today for the evaluation of chest painat the request of Dr Alvino Chapel.  Assessment & Plan    1. CAD/Chest pain - thus far no objective evidence of ischemia by enzymes or EKG, echo with normal LVEF - subjectively he describes a story very suggestive of progressing unstable angina - at 317  lbs and BMI 45 noninvasive ischemic testing would be of limited value, particularly given his high pretest probablity given his known prior history of disease and typical symptoms - would plan for [facility] tomorrow for definitive evaluation. RHC given the degree of chronic dyspnea he has had without a clear primary etiology and risks for pulm HTN given his rheumatoid arthritis, smoking history, and obesity.   - medical therapy with ASA, atorva, plavix (has been on at home), losartan, metoprolol, nitro gtt, hep gtt   2. COVID + test After initial consultation patient's COVID test came back + by pcp. He reports that he was positive Jan 3rd, at that time was symptomatic with loss of taste and smell, fatigue. Symptoms resolved after about 1 week, he subsequently returned to work per standard protocols.   - I discussed with internal medicine, PCR would still be positive from infection last month. In this setting if asymptomatic no additional management or workup is indicated and also typically hospital precautions are lifted after 10 days when asymptomatic and thus technically does not require hospital precautions. Given this would still plan for transfer and cath tomorrow.    3. Rheumatoid arthritis - continue prednisone - given long history of persistent DOE and inability to measure PASP by echo will ask  for RHC during his cath as well given risks for pulmonary HTN    For questions or updates, please contact [place] Please consult [email or web address] for contact info under        Signed, Carlyle Dolly, MD  05/16/2020, 7:48 AM

## 2020-05-16 NOTE — Progress Notes (Signed)
ANTICOAGULATION CONSULT NOTE -  Pharmacy Consult for Heparin Indication: chest pain/ACS  Allergies  Allergen Reactions  . Hydroxychloroquine Dermatitis    Skin Rash    Patient Measurements: Height: 5\' 10"  (177.8 cm) Weight: (!) 144.2 kg (318 lb) IBW/kg (Calculated) : 73 HEPARIN DW (KG): 107.1  Vital Signs: BP: 149/83 (02/08 1045) Pulse Rate: 82 (02/08 1300)  Labs: Recent Labs    05/15/20 1215 05/15/20 1433 05/16/20 0038 05/16/20 0813 05/16/20 1401  HGB 11.8*  --  11.9*  --   --   HCT 37.7*  --  38.3*  --   --   PLT 253  --  262  --   --   HEPARINUNFRC  --   --  0.28* 0.50 0.49  CREATININE 0.89  --   --   --   --   TROPONINIHS 13 11  --   --   --     Estimated Creatinine Clearance: 131.5 mL/min (by C-G formula based on SCr of 0.89 mg/dL).   Medical History: Past Medical History:  Diagnosis Date  . Acute febrile illness 10/20/2019  . Allergy   . Arthralgia 06/03/2014  . Arthritis   . Asthma   . Bacteremia 10/21/2019  . CAD (coronary artery disease) 06/20/2014  . Cellulitis and abscess of right leg with Bacteremia 10/21/2019  . Chest pain 05/31/2014  . Cholelithiases 10/26/2013  . Claudication ([place]) 04/15/2016  . COPD (chronic obstructive pulmonary disease) ([place]) 10/21/2019  . Coronary artery disease 2016   90% RCA and 30% LM S/P PCI of RCA  . DOE (dyspnea on exertion)    Quit smoking 2002 - Spirometry 03/29/2019  FEV1 2.25 (55%)  Ratio 0.74 with no resp to saba p ? Prior    Onset June 2020 with cards w/u neg 02/2019 and assoc with atypical cp on prn ppi - 03/29/2019   Walked RA x two laps =  approx 567ft @ fast pace - stopped due to end of study with sats of 92% at the end of the study and mild sob  - 03/29/2019 max rx for GERD     . Elevated troponin   . GERD (gastroesophageal reflux disease)   . Headache    stress and tension  . History of nuclear stress test    Myoview 1/19: EF 61, no ischemia, low risk  . Hyperlipidemia   . Hypertension   . Melanoma ([place])  11/24/2019   Right upper back. Superficial spreading. 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 Morbid obesity due to excess calories ([place]) 04/01/2019   Baseline wt around 250 when quit smoking 2002   . Neuromuscular disorder ([facility])    restless legs and leg cramps  . Normocytic anemia 06/03/2014  . Rash and nonspecific skin eruption   . Restless leg syndrome 12/28/2013  . Rheumatoid arthritis ([place]) 06/2019  . Shortness of breath    Starting May 2015  . Stented coronary artery   . 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 Tubular adenoma of colon 03/2013    Medications:  See med rec  Assessment: Patient presented to ED with chest pain for the past week. Now he is getting more SOB. Patient is not on any oral anticoagulants. Pharmacy asked to start heparin.  HL 0.49, therapeutic   Goal of Therapy:  Heparin level 0.3-0.7 units/ml Monitor platelets by anticoagulation protocol: Yes   Plan:  Continue  heparin infusion at 1500 units/hr Check anti-Xa level daily while on heparin Continue to monitor H&H and  platelets  Isac Sarna, BS [place], BCPS Clinical Pharmacist Pager [phone] 05/16/2020,3:16 PM

## 2020-05-16 NOTE — Interval H&P Note (Signed)
Cath Lab Visit (complete for each Cath Lab visit)  Clinical Evaluation Leading to the Procedure:   ACS: No.  Non-ACS:    Anginal Classification: CCS III  Anti-ischemic medical therapy: Maximal Therapy (2 or more classes of medications)  Non-Invasive Test Results: No non-invasive testing performed  Prior CABG: No previous CABG      History and Physical Interval Note:  05/16/2020 5:11 PM  Colin Lee  has presented today for surgery, with the diagnosis of chest pain.  The various methods of treatment have been discussed with the patient and family. After consideration of risks, benefits and other options for treatment, the patient has consented to  Procedure(s): RIGHT/LEFT HEART CATH AND CORONARY ANGIOGRAPHY (N/A) as a surgical intervention.  The patient's history has been reviewed, patient examined, no change in status, stable for surgery.  I have reviewed the patient's chart and labs.  Questions were answered to the patient's satisfaction.     Shelva Majestic

## 2020-05-16 NOTE — Progress Notes (Signed)
ANTICOAGULATION CONSULT NOTE -  Pharmacy Consult for Heparin Indication: chest pain/ACS  Allergies  Allergen Reactions  . Hydroxychloroquine Dermatitis    Skin Rash    Patient Measurements: Height: 5\' 10"  (177.8 cm) Weight: (!) 144.2 kg (318 lb) IBW/kg (Calculated) : 73 HEPARIN DW (KG): 107.1  Vital Signs: BP: 153/89 (02/08 0730) Pulse Rate: 70 (02/08 0730)  Labs: Recent Labs    05/15/20 1215 05/15/20 1433 05/16/20 0038 05/16/20 0813  HGB 11.8*  --  11.9*  --   HCT 37.7*  --  38.3*  --   PLT 253  --  262  --   HEPARINUNFRC  --   --  0.28* 0.50  CREATININE 0.89  --   --   --   TROPONINIHS 13 11  --   --     Estimated Creatinine Clearance: 131.5 mL/min (by C-G formula based on SCr of 0.89 mg/dL).   Medical History: Past Medical History:  Diagnosis Date  . Acute febrile illness 10/20/2019  . Allergy   . Arthralgia 06/03/2014  . Arthritis   . Asthma   . Bacteremia 10/21/2019  . CAD (coronary artery disease) 06/20/2014  . Cellulitis and abscess of right leg with Bacteremia 10/21/2019  . Chest pain 05/31/2014  . Cholelithiases 10/26/2013  . Claudication ([place]) 04/15/2016  . COPD (chronic obstructive pulmonary disease) ([place]) 10/21/2019  . Coronary artery disease 2016   90% RCA and 30% LM S/P PCI of RCA  . DOE (dyspnea on exertion)    Quit smoking 2002 - Spirometry 03/29/2019  FEV1 2.25 (55%)  Ratio 0.74 with no resp to saba p ? Prior    Onset June 2020 with cards w/u neg 02/2019 and assoc with atypical cp on prn ppi - 03/29/2019   Walked RA x two laps =  approx 568ft @ fast pace - stopped due to end of study with sats of 92% at the end of the study and mild sob  - 03/29/2019 max rx for GERD     . Elevated troponin   . GERD (gastroesophageal reflux disease)   . Headache    stress and tension  . History of nuclear stress test    Myoview 1/19: EF 61, no ischemia, low risk  . Hyperlipidemia   . Hypertension   . Melanoma ([place]) 11/24/2019   Right upper back. Superficial  spreading. Breslow's 0.83mm. Clark's II  . Morbid obesity due to excess calories ([place]) 04/01/2019   Baseline wt around 250 when quit smoking 2002   . Neuromuscular disorder ([facility])    restless legs and leg cramps  . Normocytic anemia 06/03/2014  . Rash and nonspecific skin eruption   . Restless leg syndrome 12/28/2013  . Rheumatoid arthritis ([place]) 06/2019  . Shortness of breath    Starting May 2015  . Stented coronary artery   . 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 Tubular adenoma of colon 03/2013    Medications:  See med rec  Assessment: Patient presented to ED with chest pain for the past week. Now he is getting more SOB. Patient is not on any oral anticoagulants. Pharmacy asked to start heparin.  HL 0.5, therapeutic now  Goal of Therapy:  Heparin level 0.3-0.7 units/ml Monitor platelets by anticoagulation protocol: Yes   Plan:  Continue  heparin infusion at 1500 units/hr Check anti-Xa level in 6-8 hours and daily while on heparin Continue to monitor H&H and platelets  Isac Sarna, BS Vena Austria, BCPS Clinical Pharmacist Pager [phone] 05/16/2020,8:56 AM

## 2020-05-16 NOTE — Progress Notes (Signed)
[facility] for Heparin Indication: chest pain/ACS  Labs: Recent Labs    05/15/20 1215 05/15/20 1433 05/16/20 0038  HGB 11.8*  --   --   HCT 37.7*  --   --   PLT 253  --   --   HEPARINUNFRC  --   --  0.28*  CREATININE 0.89  --   --   TROPONINIHS 13 11  --     Assessment: Patient presented to ED with chest pain for the past week. Now he is getting more SOB. Patient is not on any oral anticoagulants. Pharmacy asked to start heparin. Initial heparin level 0.28 units/hr  Goal of Therapy:  Heparin level 0.3-0.7 units/ml Monitor platelets by anticoagulation protocol: Yes   Plan:  Increase heparin to 1400 units/hr Check heparin level in 6-8 hours  Thanks for allowing pharmacy to be a part of this patient's care.  Excell Seltzer, PharmD Clinical Pharmacist 05/16/2020,1:19 AM

## 2020-05-16 NOTE — Progress Notes (Signed)
Arrived from [facility] by [facility] to holding area on Heparin infusion at 1500U and NTG infusion at 74mcg w/0.9NS at 10cc/hr to rt ac.

## 2020-05-17 ENCOUNTER — Observation Stay (HOSPITAL_COMMUNITY): Payer: BC Managed Care – PPO

## 2020-05-17 DIAGNOSIS — J432 Centrilobular emphysema: Secondary | ICD-10-CM

## 2020-05-17 DIAGNOSIS — R000 Tachycardia, unspecified: Secondary | ICD-10-CM

## 2020-05-17 DIAGNOSIS — I10 Essential (primary) hypertension: Secondary | ICD-10-CM | POA: Diagnosis not present

## 2020-05-17 DIAGNOSIS — U071 COVID-19: Secondary | ICD-10-CM | POA: Diagnosis not present

## 2020-05-17 DIAGNOSIS — R0602 Shortness of breath: Secondary | ICD-10-CM | POA: Diagnosis not present

## 2020-05-17 DIAGNOSIS — R079 Chest pain, unspecified: Secondary | ICD-10-CM | POA: Diagnosis not present

## 2020-05-17 DIAGNOSIS — J849 Interstitial pulmonary disease, unspecified: Secondary | ICD-10-CM | POA: Diagnosis not present

## 2020-05-17 LAB — BASIC METABOLIC PANEL
Anion gap: 11 (ref 5–15)
BUN: 19 mg/dL (ref 6–20)
CO2: 27 mmol/L (ref 22–32)
Calcium: 8.9 mg/dL (ref 8.9–10.3)
Chloride: 100 mmol/L (ref 98–111)
Creatinine, Ser: 1.16 mg/dL (ref 0.61–1.24)
GFR, Estimated: >60 mL/min (ref >60)
Glucose, Bld: 171 mg/dL — ABNORMAL HIGH (ref 70–99)
Potassium: 3.6 mmol/L (ref 3.5–5.1)
Sodium: 138 mmol/L (ref 135–145)

## 2020-05-17 LAB — CBC
HCT: 38.8 % — ABNORMAL LOW (ref 39.0–52.0)
Hemoglobin: 12.7 g/dL — ABNORMAL LOW (ref 13.0–17.0)
MCH: 28.3 pg (ref 26.0–34.0)
MCHC: 32.7 g/dL (ref 30.0–36.0)
MCV: 86.4 fL (ref 80.0–100.0)
Platelets: 262 10*3/uL (ref 150–400)
RBC: 4.49 MIL/uL (ref 4.22–5.81)
RDW: 14.1 % (ref 11.5–15.5)
WBC: 7 10*3/uL (ref 4.0–10.5)
nRBC: 0 % (ref 0.0–0.2)

## 2020-05-17 LAB — TSH: TSH: 1.707 u[IU]/mL (ref 0.350–4.500)

## 2020-05-17 LAB — HEPARIN LEVEL (UNFRACTIONATED): Heparin Unfractionated: <0.1 IU/mL — ABNORMAL LOW (ref 0.30–0.70)

## 2020-05-17 LAB — D-DIMER, QUANTITATIVE: D-Dimer, Quant: 0.6 ug/mL-FEU — ABNORMAL HIGH (ref 0.00–0.50)

## 2020-05-17 MED ORDER — FUROSEMIDE 40 MG PO TABS
40.0000 mg | ORAL_TABLET | Freq: Once | ORAL | Status: AC
  Administered 2020-05-17: 40 mg via ORAL
  Filled 2020-05-17: qty 1

## 2020-05-17 NOTE — Progress Notes (Addendum)
Progress Note  Patient Name: Colin Lee Date of Encounter: 05/17/2020  [facility] Cardiologist: Fransico Him, MD   Subjective   No chest pain, still with DOE and he tells me he has increased HR with this.   When he has worn pulse ox at home his 02 is stable but HR up.  Inpatient Medications    Scheduled Meds: . aspirin EC  81 mg Oral Daily  . atorvastatin  20 mg Oral q morning  . clopidogrel  75 mg Oral Daily  . famotidine  30 mg Oral QHS  . losartan  50 mg Oral Daily  . metoprolol tartrate  12.5 mg Oral BID  . predniSONE  10 mg Oral BID WC  . sodium chloride flush  3 mL Intravenous Q12H   Continuous Infusions: . sodium chloride Stopped (05/17/20 0500)  . sodium chloride    . nitroGLYCERIN 5 mcg/min (05/15/20 2054)   PRN Meds: sodium chloride, acetaminophen, albuterol, diazepam, nitroGLYCERIN, ondansetron (ZOFRAN) IV, sodium chloride flush   Vital Signs    Vitals:   05/16/20 1838 05/16/20 1901 05/16/20 2117 05/17/20 0446  BP: 108/80 (!) 141/92 130/81 (!) 159/94  Pulse: 87 85  78  Resp: 18   18  Temp: 97.6 F (36.4 C)  98.7 F (37.1 C) 99.5 F (37.5 C)  TempSrc: Oral   Oral  SpO2: 95% 95%  96%  Weight:    (!) 143.9 kg  Height:        Intake/Output Summary (Last 24 hours) at 05/17/2020 0802 Last data filed at 05/17/2020 0027 Gross per 24 hour  Intake --  Output 600 ml  Net -600 ml   Last 3 Weights 05/17/2020 05/15/2020 02/18/2020  Weight (lbs) 317 lb 3.2 oz 318 lb 316 lb 9.6 oz  Weight (kg) 143.881 kg 144.244 kg 143.609 kg      Telemetry    SR to ST at 124 occ PVCs. - Personally Reviewed  ECG    ST at 103 and no acute ST changes - Personally Reviewed  Physical Exam   GEN: No acute distress.  Frustrated that he does not have an answer to his symptoms Neck: No JVD Cardiac: RRR, no murmurs, rubs, or gallops.  Respiratory: Clear to auscultation bilaterally. GI: Soft, nontender, non-distended  MS: No edema; No deformity. Neuro:  Nonfocal   Psych: Normal affect   Labs    High Sensitivity Troponin:   Recent Labs  Lab 05/15/20 1215 05/15/20 1433  TROPONINIHS 13 11      Chemistry Recent Labs  Lab 05/15/20 1215 05/16/20 1738 05/16/20 1752  NA 137 140  139 137  K 4.0 4.1  4.2 4.0  CL 105  --   --   CO2 25  --   --   GLUCOSE 168*  --   --   BUN 20  --   --   CREATININE 0.89  --   --   CALCIUM 9.0  --   --   GFRNONAA >60  --   --   ANIONGAP 7  --   --      Hematology Recent Labs  Lab 05/15/20 1215 05/16/20 0038 05/16/20 1738 05/16/20 1752 05/17/20 0152  WBC 7.5 9.4  --   --  7.0  RBC 4.28 4.34  --   --  4.49  HGB 11.8* 11.9* 12.6*  12.9* 11.6* 12.7*  HCT 37.7* 38.3* 37.0*  38.0* 34.0* 38.8*  MCV 88.1 88.2  --   --  86.4  MCH 27.6 27.4  --   --  28.3  MCHC 31.3 31.1  --   --  32.7  RDW 14.2 14.2  --   --  14.1  PLT 253 262  --   --  262    BNP Recent Labs  Lab 05/15/20 1215  BNP 87.0     DDimer No results for input(s): DDIMER in the last 168 hours.   Radiology    DG Chest 2 View  Result Date: 05/15/2020 CLINICAL DATA:  Chest pain EXAM: CHEST - 2 VIEW COMPARISON:  10/20/2019 chest radiograph. FINDINGS: Stable cardiomediastinal silhouette with normal heart size. No pneumothorax. No pleural effusion. Mildly hyperinflated lungs. Chronic patchy reticular opacities throughout the peripheral lungs bilaterally, left greater than right, not appreciably changed. No superimposed acute consolidative airspace disease or pulmonary edema. IMPRESSION: 1. No acute cardiopulmonary disease. 2. Chronic patchy reticular opacities throughout the peripheral lungs bilaterally, left greater than right, not appreciably changed, compatible with nonspecific pulmonary fibrosis as seen on prior chest CT. Electronically Signed   By: Ilona Sorrel M.D.   On: 05/15/2020 12:57   CARDIAC CATHETERIZATION  Result Date: 05/16/2020  Previously placed Ost RCA to Prox RCA stent (unknown type) is widely patent.  Mild right heart  pressure elevation with mild pulmonary hypertension with PA mean 5 mm. No significant CAD with widely patent stent in the proximal RCA; normal LAD and left circumflex vessels. RECOMMENDATION: Medical therapy for diastolic dysfunction with optimal blood pressure control and weight loss.  With mild pulmonary hypertension, consider evaluation for obstructive sleep apnea.    ECHOCARDIOGRAM COMPLETE  Result Date: 05/15/2020    ECHOCARDIOGRAM REPORT   Patient Name:   Colin Lee Date of Exam: 05/15/2020 Medical Rec #:  [id number]           Height:       70.0 in Accession #:    [id number]          Weight:       318.0 lb Date of Birth:  1960-12-01           BSA:          2.542 m Patient Age:    60 years            BP:           142/85 mmHg Patient Gender: M                   HR:           83 bpm. Exam Location:  [facility] Procedure: 2D Echo, Cardiac Doppler and Color Doppler Indications:    Chest Pain R07.9  History:        Patient has prior history of Echocardiogram examinations, most                 recent 03/03/2019. CAD; Risk Factors:Hypertension and                 Dyslipidemia. Morbid obesity due to excess calories, GERD,                 Covid-19.  Sonographer:    Alvino Chapel RCS Referring Phys: [id number] Wis[place]  1. Left ventricular ejection fraction, by estimation, is 60 to 65%. The left ventricle has normal function. The left ventricle has no regional wall motion abnormalities. There is mild left ventricular hypertrophy. Left ventricular diastolic parameters are consistent with Grade I diastolic dysfunction (impaired relaxation).  2. Right  ventricular systolic function is normal. The right ventricular size is normal.  3. Left atrial size was mild to moderately dilated.  4. The mitral valve is normal in structure. No evidence of mitral valve regurgitation. No evidence of mitral stenosis.  5. The aortic valve is tricuspid. There is mild calcification of the aortic valve. There is mild  thickening of the aortic valve. Aortic valve regurgitation is not visualized. No aortic stenosis is present. FINDINGS  Left Ventricle: Left ventricular ejection fraction, by estimation, is 60 to 65%. The left ventricle has normal function. The left ventricle has no regional wall motion abnormalities. The left ventricular internal cavity size was normal in size. There is  mild left ventricular hypertrophy. Left ventricular diastolic parameters are consistent with Grade I diastolic dysfunction (impaired relaxation). Normal left ventricular filling pressure. Right Ventricle: The right ventricular size is normal. No increase in right ventricular wall thickness. Right ventricular systolic function is normal. Left Atrium: Left atrial size was mild to moderately dilated. Right Atrium: Right atrial size was normal in size. Pericardium: There is no evidence of pericardial effusion. Mitral Valve: The mitral valve is normal in structure. No evidence of mitral valve regurgitation. No evidence of mitral valve stenosis. Tricuspid Valve: The tricuspid valve is normal in structure. Tricuspid valve regurgitation is not demonstrated. No evidence of tricuspid stenosis. Aortic Valve: The aortic valve is tricuspid. There is mild calcification of the aortic valve. There is mild thickening of the aortic valve. There is mild aortic valve annular calcification. Aortic valve regurgitation is not visualized. No aortic stenosis  is present. Aortic valve mean gradient measures 6.3 mmHg. Aortic valve peak gradient measures 12.0 mmHg. Aortic valve area, by VTI measures 3.03 cm. Pulmonic Valve: The pulmonic valve was not well visualized. Pulmonic valve regurgitation is not visualized. No evidence of pulmonic stenosis. Aorta: The aortic root is normal in size and structure. IAS/Shunts: The interatrial septum was not well visualized.  LEFT VENTRICLE PLAX 2D LVIDd:         5.50 cm  Diastology LVIDs:         2.40 cm  LV e' medial:    6.64 cm/s LV PW:          1.20 cm  LV E/e' medial:  10.5 LV IVS:        1.00 cm  LV e' lateral:   10.00 cm/s LVOT diam:     2.10 cm  LV E/e' lateral: 7.0 LV SV:         89 LV SV Index:   35 LVOT Area:     3.46 cm  RIGHT VENTRICLE RV S prime:     18.30 cm/s TAPSE (M-mode): 2.2 cm LEFT ATRIUM              Index       RIGHT ATRIUM           Index LA diam:        4.00 cm  1.57 cm/m  RA Area:     11.80 cm LA Vol (A2C):   86.9 ml  34.19 ml/m RA Volume:   23.00 ml  9.05 ml/m LA Vol (A4C):   101.0 ml 39.73 ml/m LA Biplane Vol: 95.5 ml  37.57 ml/m  AORTIC VALVE AV Area (Vmax):    2.30 cm AV Area (Vmean):   2.41 cm AV Area (VTI):     3.03 cm AV Vmax:           173.20 cm/s AV Vmean:  117.602 cm/s AV VTI:            0.294 m AV Peak Grad:      12.0 mmHg AV Mean Grad:      6.3 mmHg LVOT Vmax:         115.00 cm/s LVOT Vmean:        81.700 cm/s LVOT VTI:          0.257 m LVOT/AV VTI ratio: 0.87  AORTA Ao Root diam: 3.80 cm MITRAL VALVE MV Area (PHT): 2.10 cm    SHUNTS MV Decel Time: 361 msec    Systemic VTI:  0.26 m MV E velocity: 69.90 cm/s  Systemic Diam: 2.10 cm MV A velocity: 84.70 cm/s MV E/A ratio:  0.83 Carlyle Dolly MD Electronically signed by Carlyle Dolly MD Signature Date/Time: 05/15/2020/4:35:00 PM    Final     Cardiac Studies   05/15/20 cardiac cath  Previously placed Ost RCA to Prox RCA stent (unknown type) is widely patent.   Mild right heart pressure elevation with mild pulmonary hypertension with PA mean 5 mm.  No significant CAD with widely patent stent in the proximal RCA; normal LAD and left circumflex vessels.  RECOMMENDATION: Medical therapy for diastolic dysfunction with optimal blood pressure control and weight loss.  With mild pulmonary hypertension, consider evaluation for obstructive sleep apnea.     RHC Right Heart Pressures RA: A-wave 16, V wave 15; mean 14 RV: 41/15 PA: 33/13, mean 25 PW: A-wave 18, V wave 17; mean 16 Initial AO: 142/91  Pullback: LV: 133/17 AO:  1/81  Oxygen saturation in the pulmonary artery 75% and in the central aorta 99%. By the Fick method, cardiac output 8.7 L/min and cardiac index 3.4 L/min/m.    Diagnostic Dominance: Right    Intervention    Echo 05/15/20 IMPRESSIONS    1. Left ventricular ejection fraction, by estimation, is 60 to 65%. The  left ventricle has normal function. The left ventricle has no regional  wall motion abnormalities. There is mild left ventricular hypertrophy.  Left ventricular diastolic parameters  are consistent with Grade I diastolic dysfunction (impaired relaxation).  2. Right ventricular systolic function is normal. The right ventricular  size is normal.  3. Left atrial size was mild to moderately dilated.  4. The mitral valve is normal in structure. No evidence of mitral valve  regurgitation. No evidence of mitral stenosis.  5. The aortic valve is tricuspid. There is mild calcification of the  aortic valve. There is mild thickening of the aortic valve. Aortic valve  regurgitation is not visualized. No aortic stenosis is present.   FINDINGS  Left Ventricle: Left ventricular ejection fraction, by estimation, is 60  to 65%. The left ventricle has normal function. The left ventricle has no  regional wall motion abnormalities. The left ventricular internal cavity  size was normal in size. There is  mild left ventricular hypertrophy. Left ventricular diastolic parameters  are consistent with Grade I diastolic dysfunction (impaired relaxation).  Normal left ventricular filling pressure.   Right Ventricle: The right ventricular size is normal. No increase in  right ventricular wall thickness. Right ventricular systolic function is  normal.   Left Atrium: Left atrial size was mild to moderately dilated.   Right Atrium: Right atrial size was normal in size.   Pericardium: There is no evidence of pericardial effusion.   Mitral Valve: The mitral valve is normal in structure.  No evidence of  mitral valve regurgitation. No evidence of mitral valve  stenosis.   Tricuspid Valve: The tricuspid valve is normal in structure. Tricuspid  valve regurgitation is not demonstrated. No evidence of tricuspid  stenosis.   Aortic Valve: The aortic valve is tricuspid. There is mild calcification  of the aortic valve. There is mild thickening of the aortic valve. There  is mild aortic valve annular calcification. Aortic valve regurgitation is  not visualized. No aortic stenosis  is present. Aortic valve mean gradient measures 6.3 mmHg. Aortic valve  peak gradient measures 12.0 mmHg. Aortic valve area, by VTI measures 3.03  cm.   Pulmonic Valve: The pulmonic valve was not well visualized. Pulmonic valve  regurgitation is not visualized. No evidence of pulmonic stenosis.   Aorta: The aortic root is normal in size and structure.   IAS/Shunts: The interatrial septum was not well visualized.      Patient Profile     60 y.o. male with a hx of CAD, prior DES X 2 to RCA, pl. Effusion s/p talc pleurodesis, RA, COPD presented to [facility] 05/15/20 with chest pain.   Assessment & Plan    Chest pain and known CAD with troponin 13 and EKG with SR no ischemic changes. With story of progressive angina pt transferred to [facility] for cardiac cath and fortunately stents open and no other CAD to account for chest pain. --continue ASA, lipitor, plavix losartan and metoprolol --ambulate and probable discharge home pt frustrated no answer yet for dyspnea  DOE with RHC done with mild pulmonary hypertension  abd girth is adding to dyspnea - gained 26 lbs on steroids.  When he bends over is very dyspneic.   --relates that HR increases before SOB.  Her he is tachycardic.   Covid positive though was positive 04/10/20 with loss of taste and smell and fatigue, symptoms resolved.  PCR can be positive for 1 month post infection.  ? If DOE from [place] stable echo vs COPD alone. ? Pulmonary consult as  outpt --CXR--Chronic patchy reticular opacities throughout the peripheral lungs bilaterally, left greater than right, not appreciably changed, compatible with nonspecific pulmonary fibrosis as seen on prior chest CT.  RA stable, with hx of pl effusion, though rheumatology work up neg for pulmonary rheumatoid and no malignancy. Followed by Wallene Huh MD at [facility] for pulmonary    HLD continue statin LDL goal <70  Lipids in July LDL 74   HTN elevated this AM    OSA has not yet been tested, every time arranged he has hospitalization.   For questions or updates, please contact [place] Please consult [email or web address] for contact info under        Signed, Cecilie Kicks, NP  05/17/2020, 8:02 AM    History and all data above reviewed.  Patient examined.  I agree with the findings as above.  He is very concerned about his HR being high.  He does not have an automatic arrhythmia.  He has sinus tach on tele.  He does have SOB with ambulation.  No PND or orthopnea.  The patient exam reveals COR:RRR  ,  Lungs: Crackles dry right greater than left lung  ,  Abd: Obese, Ext Mild edema  .  All available labs, radiology testing, previous records reviewed. Agree with documented assessment and plan.   Tachycardia:  I suspect he has some at least mild decrease in sats driving sinus tach.  He has an abnormal CXR and lung exam.  I will ask pulmonary to see him to see if further in patient work  up is needed.  I will check ambulatory sats.  I will order orthostatics.  I will check a D Dimer.    Jeneen Rinks Gwendlyn Hanback  12:42 PM  05/17/2020

## 2020-05-17 NOTE — Consult Note (Signed)
NAME:  Colin Lee, MRN:  [id number], DOB:  08-21-1960, LOS: 0 ADMISSION DATE:  05/15/2020, CONSULTATION DATE:  05/17/2020 REFERRING MD:  Nelva Bush CHIEF COMPLAINT:  SOB/DOE   Brief History:  60 year old male who presented to [facility] ED 2/7 for CP, SOB and DOE. Admitted to [facility] 2/7 for cardiac workup in the setting of CAD; LHC demonstrated patent RCA stent and unremarkable Echo. PCCM consulted for pulmonary workup, given negative cardiac workup and ongoing SOB.  History of Present Illness:  Colin Lee is a 60 year old male with initially presented to [facility] ED 2/7 for CP, SOB and DOE. PMHx significant for obesity, RA (on prednisone/Humira, dx 06/2019), CAD (s/p PCI with DES x 2 to RCA 2015), recurrent pleural effusion (requiring multiple CT placements with eventual left-sided talc pleurodesis 2015), former smoker with COPD/emphysema.  Patient presented to [facility] 2/7 reporting a one month-long history of worsening SOB/DOE and CP. Patient states that SOB/DOE has been present since 2015 when he was noted to have a recurrent pleural effusion that eventually required pleurodesis (Dr. Pia Mau). During patient's workup for pleural effusion, a LHC was completed demonstrating 90% occlusion of RCA and DES x 2 were placed. Recently was diagnosed with COVID 04/2020, but denies any significant change in symptoms from pre- to post-COVID. He notes decreased exercise tolerance (can only ambulate to the bathroom before he becomes SOB/tachycardic, cannot climb stairs).  Regarding patient's pulmonary history, he is a former smoker (smoked 1.5-2 ppd, stopped 2002) and has known COPD/emphysema (last PFTs 2015, in-office spirometry 03/2019 with FEV1 2.25 55% and ratio 0.74). He previously worked at a [employer] until 2011, now works in Theatre manager for [employer] (does not routinely work with harsh chemicals).   Past Medical History:   Past Medical History:  Diagnosis Date  . Acute febrile illness  10/20/2019  . Allergy   . Arthralgia 06/03/2014  . Arthritis   . Asthma   . Bacteremia 10/21/2019  . CAD (coronary artery disease) 06/20/2014  . Cellulitis and abscess of right leg with Bacteremia 10/21/2019  . Chest pain 05/31/2014  . Cholelithiases 10/26/2013  . Claudication ([place]) 04/15/2016  . COPD (chronic obstructive pulmonary disease) ([place]) 10/21/2019  . Coronary artery disease 2016   90% RCA and 30% LM S/P PCI of RCA  . DOE (dyspnea on exertion)    Quit smoking 2002 - Spirometry 03/29/2019  FEV1 2.25 (55%)  Ratio 0.74 with no resp to saba p ? Prior    Onset June 2020 with cards w/u neg 02/2019 and assoc with atypical cp on prn ppi - 03/29/2019   Walked RA x two laps =  approx 515ft @ fast pace - stopped due to end of study with sats of 92% at the end of the study and mild sob  - 03/29/2019 max rx for GERD     . Elevated troponin   . GERD (gastroesophageal reflux disease)   . Headache    stress and tension  . History of nuclear stress test    Myoview 1/19: EF 61, no ischemia, low risk  . Hyperlipidemia   . Hypertension   . Melanoma ([place]) 11/24/2019   Right upper back. Superficial spreading. Breslow's 0.53mm. Clark's II  . Morbid obesity due to excess calories ([place]) 04/01/2019   Baseline wt around 250 when quit smoking 2002   . Neuromuscular disorder (HCC)    restless legs and leg cramps  . Normocytic anemia 06/03/2014  . Rash and nonspecific skin eruption   .  Restless leg syndrome 12/28/2013  . Rheumatoid arthritis ([place]) 06/2019  . Shortness of breath    Starting May 2015  . Stented coronary artery   . 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 Tubular adenoma of colon 03/2013   Significant Hospital Events:  2/7 Presented to [facility] for SOB/DOE, CP; admitted to [facility] under Cardiology 2/8 LHC completed, RCA stent widely patent 2/9 PCCM consulted for pulmonary workup  Consults:  PCCM  Procedures:  N/A  Significant Diagnostic Tests:   10/20/2019 CTA Chest >> Negative tfor  PE, emphysema with basilar predominant areas of scarring and fibrosis; aortic atherosclerosis  2/7 CXR >> no acute cardiopulmonary disease; chronic patchy reticular opacities throughout the peripheral lungs bilaterally (L > R) without appreciable change, compatible with nonspecific pulmonary fibrosis as seen on prior CT  2/8 LHC >> mild R heart pressure elevation with mild pulmonary HTN (PA mean 43mm), no significant CAD with widely patent stent in the proximal RCA, normal LAD and L circumflex vessels  Micro Data:  N/A  Antimicrobials:  N/A   Interim History / Subjective:    Objective   Blood pressure (!) 103/57, pulse 94, temperature 97.8 F (36.6 C), temperature source Oral, resp. rate 14, height 5\' 10"  (1.778 m), weight (!) 143.9 kg, SpO2 93 %.        Intake/Output Summary (Last 24 hours) at 05/17/2020 1459 Last data filed at 05/17/2020 1025 Gross per 24 hour  Intake 3 ml  Output 600 ml  Net -597 ml   Filed Weights   05/15/20 1153 05/17/20 0446  Weight: (!) 144.2 kg (!) 143.9 kg   Examination: General: WDWN adult male, sitting in chair at bedside, mildly SOB, in NAD. HEENT: [place]/AT, anicteric sclera, moist mucous membranes. Neuro: A&Ox4, speaking in full sentences (though limited by SOB), answering questions appropriately, moves all extremities. No focal neurologic deficits. CV: Tachycardic to 100s on monitor, regular rhythm, no m/g/r. PULM: Breathing even and mildly labored on RA, lung fields CTAB in upper fields, mild bibasilar crackles noted (R > L). GI: Obese, soft, nontender, nondistended. Extremities: Bilateral symmetric 1+ pitting edema to BLE. No clubbing noted. Skin: Warm/dry, scarring on posterior back/shoulder from prior melanoma.  Resolved Hospital Problem list     Assessment & Plan:  Dyspnea on exertion/shortness of breath due to suspected worsening pulmonary fibrosis/ILD COPD with emphysema Mild volume overload - Obtain CT Chest High Resolution to better  characterize changes c/w pulmonary fibrosis - Lasix 40mg  PO x 1 - Repeat PFTs outpatient - F/u outpatient - Rest per Dr. Elsworth Soho  Coronary artery disease s/p DES x 2 to RCA S/p Echo 2/8 (LVEF 60-65%) and repeat LHC 2/8 (widely patent RCA stent).  Rheumatoid arthritis Recently diagnosed 06/2019, followed by Dr. Posey Pronto. Current regimen includes steroids (started 06/2018) and Humira (initiated ~01/2020).  Labs   CBC: Recent Labs  Lab 05/15/20 1215 05/16/20 0038 05/16/20 1738 05/16/20 1752 05/17/20 0152  WBC 7.5 9.4  --   --  7.0  HGB 11.8* 11.9* 12.6*  12.9* 11.6* 12.7*  HCT 37.7* 38.3* 37.0*  38.0* 34.0* 38.8*  MCV 88.1 88.2  --   --  86.4  PLT 253 262  --   --  947    Basic Metabolic Panel: Recent Labs  Lab 05/15/20 1215 05/16/20 1738 05/16/20 1752 05/17/20 1342  NA 137 140  139 137 138  K 4.0 4.1  4.2 4.0 3.6  CL 105  --   --  100  CO2 25  --   --  27  GLUCOSE 168*  --   --  171*  BUN 20  --   --  19  CREATININE 0.89  --   --  1.16  CALCIUM 9.0  --   --  8.9   GFR: Estimated Creatinine Clearance: 100.8 mL/min (by C-G formula based on SCr of 1.16 mg/dL). Recent Labs  Lab 05/15/20 1215 05/16/20 0038 05/17/20 0152  WBC 7.5 9.4 7.0    Liver Function Tests: No results for input(s): AST, ALT, ALKPHOS, BILITOT, PROT, ALBUMIN in the last 168 hours. No results for input(s): LIPASE, AMYLASE in the last 168 hours. No results for input(s): AMMONIA in the last 168 hours.  ABG    Component Value Date/Time   PHART 7.379 05/16/2020 1752   PCO2ART 50.1 (H) 05/16/2020 1752   PO2ART 135 (H) 05/16/2020 1752   HCO3 29.5 (H) 05/16/2020 1752   TCO2 31 05/16/2020 1752   O2SAT 99.0 05/16/2020 1752     Coagulation Profile: No results for input(s): INR, PROTIME in the last 168 hours.  Cardiac Enzymes: No results for input(s): CKTOTAL, CKMB, CKMBINDEX, TROPONINI in the last 168 hours.  HbA1C: Hgb A1c MFr Bld  Date/Time Value Ref Range Status  10/06/2013 10:52 PM 6.1  (H) <5.7 % Final    Comment:    (NOTE)                                                                       According to the ADA Clinical Practice Recommendations for 2011, when HbA1c is used as a screening test:  >=6.5%   Diagnostic of Diabetes Mellitus           (if abnormal result is confirmed) 5.7-6.4%   Increased risk of developing Diabetes Mellitus References:Diagnosis and Classification of Diabetes Mellitus,Diabetes RJJO,8416,60(YTKZS 1):S62-S69 and Standards of Medical Care in         Diabetes - 2011,Diabetes WFUX,3235,57 (Suppl 1):S11-S61.   CBG: No results for input(s): GLUCAP in the last 168 hours.  Review of Systems:   Systems reviewed and negative unless otherwise noted in above HPI.  Past Medical History:  He,  has a past medical history of Acute febrile illness (10/20/2019), Allergy, Arthralgia (06/03/2014), Arthritis, Asthma, Bacteremia (10/21/2019), CAD (coronary artery disease) (06/20/2014), Cellulitis and abscess of right leg with Bacteremia (10/21/2019), Chest pain (05/31/2014), Cholelithiases (10/26/2013), Claudication ([place]) (04/15/2016), COPD (chronic obstructive pulmonary disease) ([place]) (10/21/2019), Coronary artery disease (2016), DOE (dyspnea on exertion), Elevated troponin, GERD (gastroesophageal reflux disease), Headache, History of nuclear stress test, Hyperlipidemia, Hypertension, Melanoma ([place]) (11/24/2019), Morbid obesity due to excess calories ([place]) (04/01/2019), Neuromuscular disorder ([place]), Normocytic anemia (06/03/2014), Rash and nonspecific skin eruption, Restless leg syndrome (12/28/2013), Rheumatoid arthritis ([place]) (06/2019), Shortness of breath, Stented coronary artery, TEN (toxic epidermal necrolysis) ([place]) (11/10/2019), and Tubular adenoma of colon (03/2013).   Surgical History:   Past Surgical History:  Procedure Laterality Date  . CHEST TUBE INSERTION Left 02/01/2014   Procedure: INSERTION PLEURAL DRAINAGE CATHETER;  Surgeon: Grace Isaac, MD;  Location:  [place];  Service: Thoracic;  Laterality: Left;  . COLONOSCOPY    . COLONOSCOPY W/ BIOPSIES AND POLYPECTOMY  2014   benign  . LEFT HEART CATHETERIZATION WITH CORONARY ANGIOGRAM N/A 06/07/2014   Procedure: LEFT HEART  CATHETERIZATION WITH CORONARY ANGIOGRAM;  Surgeon: Peter M Martinique, MD;  Location: [facility] CATH LAB;  Service: Cardiovascular;  Laterality: N/A;  . LEFT HEART CATHETERIZATION WITH CORONARY ANGIOGRAM N/A 06/08/2014   Procedure: LEFT HEART CATHETERIZATION WITH CORONARY ANGIOGRAM;  Surgeon: Sinclair Grooms, MD;  Location: [facility] C[facility] LAB;  Service: Cardiovascular;  Laterality: N/A;  . MASS EXCISION N/A 10/25/2019   Procedure: SKIN BIOPSY;  Surgeon: Aviva Signs, MD;  Location: [facility];  Service: General;  Laterality: N/A;  . no prior surgery    . PLEURAL BIOPSY Left 02/01/2014   Procedure: PLEURAL BIOPSY;  Surgeon: Grace Isaac, MD;  Location: [place];  Service: Thoracic;  Laterality: Left;  . PLEURAL EFFUSION DRAINAGE Left 02/01/2014   Procedure: DRAINAGE OF PLEURAL EFFUSION;  Surgeon: Grace Isaac, MD;  Location: [place];  Service: Thoracic;  Laterality: Left;  . REMOVAL OF PLEURAL DRAINAGE CATHETER Left 02/16/2014   Procedure: REMOVAL OF PLEURAL DRAINAGE CATHETER;  Surgeon: Grace Isaac, MD;  Location: [place];  Service: Thoracic;  Laterality: Left;  . RIGHT/LEFT HEART CATH AND CORONARY ANGIOGRAPHY N/A 05/16/2020   Procedure: RIGHT/LEFT HEART CATH AND CORONARY ANGIOGRAPHY;  Surgeon: Troy Sine, MD;  Location: [place] CV LAB;  Service: Cardiovascular;  Laterality: N/A;  . TALC PLEURODESIS Left 02/01/2014   Procedure: Pietro Cassis;  Surgeon: Grace Isaac, MD;  Location: [place];  Service: Thoracic;  Laterality: Left;  . THORACENTESIS Left 2015  . VIDEO ASSISTED THORACOSCOPY Left 02/01/2014   Procedure: VIDEO ASSISTED THORACOSCOPY;  Surgeon: Grace Isaac, MD;  Location: [place];  Service: Thoracic;  Laterality: Left;  Marland Kitchen VIDEO BRONCHOSCOPY N/A 02/01/2014   Procedure: VIDEO  BRONCHOSCOPY;  Surgeon: Grace Isaac, MD;  Location: [place];  Service: Thoracic;  Laterality: N/A;    Social History:   reports that he quit smoking about 20 years ago. His smoking use included cigarettes. He has a 30.00 pack-year smoking history. He has never used smokeless tobacco. He reports current alcohol use. He reports that he does not use drugs.   Family History:  His family history is negative for Colon cancer, Esophageal cancer, Rectal cancer, and Stomach cancer. He was adopted.   Allergies Allergies  Allergen Reactions  . Hydroxychloroquine Dermatitis    Skin Rash     Home Medications  Prior to Admission medications   Medication Sig Start Date End Date Taking? Authorizing Provider  acetaminophen (TYLENOL) 500 MG tablet Take 1,000 mg by mouth every 6 (six) hours as needed for moderate pain.   Yes [provider]  Adalimumab (HUMIRA) 40 MG/0.4ML PSKT Inject into the skin.   Yes [provider]  albuterol (VENTOLIN HFA) 108 (90 Base) MCG/ACT inhaler Inhale 1 puff into the lungs every 6 (six) hours as needed for wheezing or shortness of breath.   Yes [provider]  aspirin EC 81 MG tablet Take 1 tablet (81 mg total) by mouth daily with breakfast. 10/26/19  Yes Emokpae, Courage, MD  atorvastatin (LIPITOR) 40 MG tablet One half daily Patient taking differently: Take 20 mg by mouth in the morning. 03/29/19  Yes Tanda Rockers, MD  clopidogrel (PLAVIX) 75 MG tablet TAKE 1 TABLET BY MOUTH ONCE DAILY. Patient taking differently: Take 75 mg by mouth in the morning. 04/01/19  Yes Turner, Eber Hong, MD  doxycycline (VIBRA-TABS) 100 MG tablet Take 1 tablet (100 mg total) by mouth 2 (two) times daily. 11/10/19  Yes Comer, Okey Regal, MD  famotidine (PEPCID) 20  MG tablet One after supper Patient taking differently: Take 29 mg by mouth at bedtime. 03/29/19  Yes Tanda Rockers, MD  losartan (COZAAR) 50 MG tablet Take 1 tablet (50 mg total) by mouth daily. 10/26/19   Yes Emokpae, Courage, MD  metoprolol tartrate (LOPRESSOR) 25 MG tablet Take 0.5 tablets (12.5 mg total) by mouth 2 (two) times daily. 06/09/14  Yes Hosie Poisson, MD  Multiple Vitamins-Minerals (CENTRUM ADULTS PO) Take 1 tablet by mouth daily.   Yes [provider]  nitroGLYCERIN (NITROSTAT) 0.4 MG SL tablet Place 1 tablet (0.4 mg total) under the tongue every 5 (five) minutesas needed for chest pain. 02/22/20  Yes Turner, Eber Hong, MD  Omega-3 Fatty Acids (FISH OIL) 1000 MG CPDR Take 4,000 mg by mouth daily. Patient taking differently: Take 2,000 mg by mouth in the morning and at bedtime. 02/10/15  Yes Turner, Eber Hong, MD  oxycodone (OXY-IR) 5 MG capsule Take one capsule every 4-6 hours as needed for pain. Do not drive or operate machinery while taking this medication. Patient taking differently: Take 5 mg by mouth every 6 (six) hours as needed for pain. 01/05/20  Yes Moye, [place], MD  pantoprazole (PROTONIX) 40 MG tablet TAKE 1 TABLET BY MOUTH ONCE DAILY. Patient taking differently: Take 40 mg by mouth in the morning. 06/21/19  Yes Turner, Eber Hong, MD  predniSONE (DELTASONE) 5 MG tablet Take 10 mg by mouth in the morning and at bedtime. Patient stated he takes two of the 5 mg tablets in the morning and evening.   Yes [provider]     Lestine Mount, PA-C [facility] 05/17/20 3:16 PM  Please see [email or web address] for pager details.

## 2020-05-18 ENCOUNTER — Other Ambulatory Visit: Payer: Self-pay | Admitting: Physician Assistant

## 2020-05-18 DIAGNOSIS — J432 Centrilobular emphysema: Secondary | ICD-10-CM | POA: Diagnosis not present

## 2020-05-18 DIAGNOSIS — R000 Tachycardia, unspecified: Secondary | ICD-10-CM | POA: Diagnosis not present

## 2020-05-18 DIAGNOSIS — J849 Interstitial pulmonary disease, unspecified: Secondary | ICD-10-CM | POA: Diagnosis not present

## 2020-05-18 DIAGNOSIS — R0602 Shortness of breath: Secondary | ICD-10-CM | POA: Diagnosis not present

## 2020-05-18 NOTE — Discharge Instructions (Signed)

## 2020-05-18 NOTE — Progress Notes (Signed)
SATURATION QUALIFICATIONS: (This note is used to comply with regulatory documentation for home oxygen)  Patient Saturations on Room Air at Rest = 89%  Patient Saturations on Room Air while Ambulating = 82%  Patient Saturations on 3 Liters of oxygen while Ambulating = 94%  Please briefly explain why patient needs home oxygen:

## 2020-05-18 NOTE — Progress Notes (Signed)
Pt SpO2 955 and HR 90 at rest SpO2 84 and HR 110 on ambulation with complain of SOB

## 2020-05-18 NOTE — Discharge Summary (Addendum)
Discharge Summary    Patient ID: MICKAEL MCNUTT MRN: [id number]; DOB: [birth year]  Admit date: 05/15/2020 Discharge date: 05/18/2020  Primary Care Provider: [name], MD  Primary Cardiologist: Fransico Him, MD  Primary Electrophysiologist:  None   Discharge Diagnoses    Principal Problem:   Shortness of breath Active Problems:   Hypertension   Chest pain   CAD (coronary artery disease)   Dyslipidemia   Morbid obesity due to excess calories (HCC)   COPD (chronic obstructive pulmonary disease) ([place])   ILD (interstitial lung disease) ([place])    Diagnostic Studies/Procedures    Right and left heart cath 05/16/20: Previously placed Ost RCA to Prox RCA stent (unknown type) is widely patent.   Mild right heart pressure elevation with mild pulmonary hypertension with PA mean 5 mm.   No significant CAD with widely patent stent in the proximal RCA; normal LAD and left circumflex vessels.   RECOMMENDATION: Medical therapy for diastolic dysfunction with optimal blood pressure control and weight loss.  With mild pulmonary hypertension, consider evaluation for obstructive sleep apnea.       Echo 05/15/20: 1. Left ventricular ejection fraction, by estimation, is 60 to 65%. The  left ventricle has normal function. The left ventricle has no regional  wall motion abnormalities. There is mild left ventricular hypertrophy.  Left ventricular diastolic parameters  are consistent with Grade I diastolic dysfunction (impaired relaxation).   2. Right ventricular systolic function is normal. The right ventricular  size is normal.   3. Left atrial size was mild to moderately dilated.   4. The mitral valve is normal in structure. No evidence of mitral valve  regurgitation. No evidence of mitral stenosis.   5. The aortic valve is tricuspid. There is mild calcification of the  aortic valve. There is mild thickening of the aortic valve. Aortic valve  regurgitation is not visualized. No aortic  stenosis is present.  _____________   History of Present Illness     RANELL SKIBINSKI is a 60 y.o. male with with a hx of CAD, prior DES X 2 to RCA, hx of pleural effusions s/p talc pleurodesis, RA, and COPD who presented to [facility] 05/15/20 with chest pain. Cardiac workup has been unimpressive. Pt continued to have DOE. Consulted PCCM for pulmonary workup.   Mr. Basaldua 60 yo  Male history of CAD with prior DES x 2 to RCA, pleural effsuion s/p talc pleurodesis, rheumatoid arthritis, COPD, presents with chest pain and DOE   Progressing symptosm over the last month. Sharp 8-9/10 pain mid to left chest that comes on with exertion, resolves with rest. Associated with SOB/DOE, occasionally palpitations. Symptoms last 5-10 minutes, resolve with rest. Both the severity of pain and DOE have been increasing over the last few weeks.     In ER started on hep gtt, nitro gtt.  ER vitals: p 82 bp 175/117 93% RA   WBC 7.5 Hgb 11.8 Plt 253 K 4 Cr 0.89 BUN 20 BNP 87 Trop 13--> EKG NSR, no ischemic changes CXR: chronic patchy reticular opacities stable   02/2019 echo LVEF 60-65%, no WMAs,  02/2019 nuclear stress: no ischemia  Hospital Course     Consultants: PCCM  Chest pain CAD He was transferred from [facility] to [facility] for further workup. Given his history, right and left heart cath was performed on 05/16/20 and revealed patent stent and no obstructive disease. He had mild pulmonary hypertension. Recommendations for medical therapy and outpatient evaluation for obstructive sleep apnea.  Dyspnea on exertion Emphysema ILD Pt continued to have DOE and PCCM was consulted. PCCM suspected this was related to his RA and recommends increasing RA treatment regimen. His O2 sat dropped to 84% with ambulation in the hall. I have ordered home O2 to set up for home. He is already established with pulmonologist and rheumatologist. I have arranged for a follow up appt on Monday with Dr. Raul Del.     Hypoxia with ambulation O2 dropped to 84% --> I have ordered home O2.   COVID-19 infection First positive test on 04/10/20 with symptoms --> symptoms resolved and he returned to work COVID-19 PCR positive on 05/15/20 --> given negative symptomology, presumed positive test was residual from prior infection.   Hypertension Continue BB, ARB   Hyperlipidemia with LDL goal < 70 10/23/2019: Cholesterol 123; HDL 30; LDL Cholesterol 74; Triglycerides 93; VLDL 19 Continue 20 mg lipitor.   Mild pulmonary hypertension Needs OP sleep study.   Pt would like to transfer cardiology care to [facility]. He will see Dr. Radford Pax for [facility], please make follow up in [place] (no availability for [facility])    Did the patient have an acute coronary syndrome (MI, NSTEMI, STEMI, etc) this admission?:  No                               Did the patient have a percutaneous coronary intervention (stent / angioplasty)?:  No.       _____________  Discharge Vitals Blood pressure 122/73, pulse 97, temperature 98.4 F (36.9 C), temperature source Oral, resp. rate 18, height 5\' 10"  (1.778 m), weight (!) 143.3 kg, SpO2 96 %.  Filed Weights   05/15/20 1153 05/17/20 0446 05/18/20 0630  Weight: (!) 144.2 kg (!) 143.9 kg (!) 143.3 kg    Labs & Radiologic Studies    CBC Recent Labs    05/16/20 0038 05/16/20 1738 05/16/20 1752 05/17/20 0152  WBC 9.4  --   --  7.0  HGB 11.9*   < > 11.6* 12.7*  HCT 38.3*   < > 34.0* 38.8*  MCV 88.2  --   --  86.4  PLT 262  --   --  262   < > = values in this interval not displayed.   Basic Metabolic Panel Recent Labs    05/15/20 1215 05/16/20 1738 05/16/20 1752 05/17/20 1342  NA 137   < > 137 138  K 4.0   < > 4.0 3.6  CL 105  --   --  100  CO2 25  --   --  27  GLUCOSE 168*  --   --  171*  BUN 20  --   --  19  CREATININE 0.89  --   --  1.16  CALCIUM 9.0  --   --  8.9   < > = values in this interval not displayed.   Liver Function Tests No results for input(s):  AST, ALT, ALKPHOS, BILITOT, PROT, ALBUMIN in the last 72 hours. No results for input(s): LIPASE, AMYLASE in the last 72 hours. High Sensitivity Troponin:   Recent Labs  Lab 05/15/20 1215 05/15/20 1433  TROPONINIHS 13 11    BNP Invalid input(s): POCBNP D-Dimer Recent Labs    05/17/20 1342  DDIMER 0.60*   Hemoglobin A1C No results for input(s): HGBA1C in the last 72 hours. Fasting Lipid Panel No results for input(s): CHOL, HDL, LDLCALC, TRIG, CHOLHDL, LDLDIRECT in the last 72  hours. Thyroid Function Tests Recent Labs    05/17/20 1342  TSH 1.707   _____________  DG Chest 2 View  Result Date: 05/15/2020 CLINICAL DATA:  Chest pain EXAM: CHEST - 2 VIEW COMPARISON:  10/20/2019 chest radiograph. FINDINGS: Stable cardiomediastinal silhouette with normal heart size. No pneumothorax. No pleural effusion. Mildly hyperinflated lungs. Chronic patchy reticular opacities throughout the peripheral lungs bilaterally, left greater than right, not appreciably changed. No superimposed acute consolidative airspace disease or pulmonary edema. IMPRESSION: 1. No acute cardiopulmonary disease. 2. Chronic patchy reticular opacities throughout the peripheral lungs bilaterally, left greater than right, not appreciably changed, compatible with nonspecific pulmonary fibrosis as seen on prior chest CT. Electronically Signed   By: Ilona Sorrel M.D.   On: 05/15/2020 12:57   CARDIAC CATHETERIZATION  Result Date: 05/16/2020  Previously placed Ost RCA to Prox RCA stent (unknown type) is widely patent.  Mild right heart pressure elevation with mild pulmonary hypertension with PA mean 5 mm. No significant CAD with widely patent stent in the proximal RCA; normal LAD and left circumflex vessels. RECOMMENDATION: Medical therapy for diastolic dysfunction with optimal blood pressure control and weight loss.  With mild pulmonary hypertension, consider evaluation for obstructive sleep apnea.    CT Chest High  Resolution  Result Date: 05/17/2020 CLINICAL DATA:  Inpatient.  Evaluate interstitial lung disease. EXAM: CT CHEST WITHOUT CONTRAST TECHNIQUE: Multidetector CT imaging of the chest was performed following the standard protocol without intravenous contrast. High resolution imaging of the lungs, as well as inspiratory and expiratory imaging, was performed. COMPARISON:  05/15/2020 chest radiograph. 10/20/2019 chest CT angiogram. FINDINGS: Cardiovascular: Top-normal heart size. No significant pericardial effusion/thickening. Three-vessel coronary atherosclerosis. Atherosclerotic nonaneurysmal thoracic aorta. Top-normal caliber main pulmonary artery (3.1 cm diameter), stable. Mediastinum/Nodes: No discrete thyroid nodules. Unremarkable esophagus. No pathologically enlarged axillary, mediastinal or hilar lymph nodes, noting limited sensitivity for the detection of hilar adenopathy on this noncontrast study. Lungs/Pleura: No pneumothorax. No pleural effusions. Mild smooth bilateral pleural thickening with thick calcified curvilinear posterior left lower lobe parenchymal band, slightly more pronounced in the interval. No right-sided calcified pleural plaques. Mild centrilobular and paraseptal emphysema. Moderate patchy confluent subpleural reticulation and ground-glass opacity throughout both lungs with associated mild traction bronchiectasis and architectural distortion. There is a mild basilar predominance to these findings, which appear mildly progressive in the interval with clear progression from more remote chest CT of 12/27/2013. No frank honeycombing. No significant lobular air trapping or evidence of tracheobronchomalacia on the expiration sequence. Upper abdomen: Mild diffuse hepatic steatosis. Simple 2.8 cm posterior left liver cyst. Cholelithiasis. Musculoskeletal: No aggressive appearing focal osseous lesions. Moderate thoracic spondylosis. IMPRESSION: 1. Spectrum of findings compatible with basilar  predominant fibrotic interstitial lung disease without frank honeycombing, mildly progressive in the interval with clear progression from more remote chest CT of 12/27/2013 high-resolution chest CT study. 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 [place], Iss 5, [phone], Dec 07 2016. 2. Chronic mild smooth bilateral pleural thickening with thick calcified parenchymal band at the dependent left lung base. Favor the sequela of remote nonspecific pleural insult (patient had left greater than right pleural effusions on remote 2015 chest CT study). 3. Three-vessel coronary atherosclerosis. 4. Mild diffuse hepatic steatosis. 5. Cholelithiasis. 6. Aortic Atherosclerosis (ICD10-I70.0) and Emphysema (ICD10-J43.9). Electronically Signed   By: Ilona Sorrel M.D.   On: 05/17/2020 19:44   ECHOCARDIOGRAM COMPLETE  Result Date:  05/15/2020    ECHOCARDIOGRAM REPORT   Patient Name:   VIVAN AGOSTINO Date of Exam: 05/15/2020 Medical Rec #:  [id number]           Height:       70.0 in Accession #:    [id number]          Weight:       318.0 lb Date of Birth:  [birth year]           BSA:          2.542 m Patient Age:    60 years            BP:           142/85 mmHg Patient Gender: M                   HR:           83 bpm. Exam Location:  [facility] Procedure: 2D Echo, Cardiac Doppler and Color Doppler Indications:    Chest Pain R07.9  History:        Patient has prior history of Echocardiogram examinations, most                 recent 03/03/2019. CAD; Risk Factors:Hypertension and                 Dyslipidemia. Morbid obesity due to excess calories, GERD,                 Covid-19.  Sonographer:    Alvino Chapel RCS Referring Phys: [id number] [place]  1. Left ventricular ejection fraction, by estimation, is 60 to 65%. The left ventricle has normal function. The left  ventricle has no regional wall motion abnormalities. There is mild left ventricular hypertrophy. Left ventricular diastolic parameters are consistent with Grade I diastolic dysfunction (impaired relaxation).  2. Right ventricular systolic function is normal. The right ventricular size is normal.  3. Left atrial size was mild to moderately dilated.  4. The mitral valve is normal in structure. No evidence of mitral valve regurgitation. No evidence of mitral stenosis.  5. The aortic valve is tricuspid. There is mild calcification of the aortic valve. There is mild thickening of the aortic valve. Aortic valve regurgitation is not visualized. No aortic stenosis is present. FINDINGS  Left Ventricle: Left ventricular ejection fraction, by estimation, is 60 to 65%. The left ventricle has normal function. The left ventricle has no regional wall motion abnormalities. The left ventricular internal cavity size was normal in size. There is  mild left ventricular hypertrophy. Left ventricular diastolic parameters are consistent with Grade I diastolic dysfunction (impaired relaxation). Normal left ventricular filling pressure. Right Ventricle: The right ventricular size is normal. No increase in right ventricular wall thickness. Right ventricular systolic function is normal. Left Atrium: Left atrial size was mild to moderately dilated. Right Atrium: Right atrial size was normal in size. Pericardium: There is no evidence of pericardial effusion. Mitral Valve: The mitral valve is normal in structure. No evidence of mitral valve regurgitation. No evidence of mitral valve stenosis. Tricuspid Valve: The tricuspid valve is normal in structure. Tricuspid valve regurgitation is not demonstrated. No evidence of tricuspid stenosis. Aortic Valve: The aortic valve is tricuspid. There is mild calcification of the aortic valve. There is mild thickening of the aortic valve. There is mild aortic valve annular calcification. Aortic valve  regurgitation is not visualized. No aortic stenosis  is present. Aortic valve mean gradient measures  6.3 mmHg. Aortic valve peak gradient measures 12.0 mmHg. Aortic valve area, by VTI measures 3.03 cm. Pulmonic Valve: The pulmonic valve was not well visualized. Pulmonic valve regurgitation is not visualized. No evidence of pulmonic stenosis. Aorta: The aortic root is normal in size and structure. IAS/Shunts: The interatrial septum was not well visualized.  LEFT VENTRICLE PLAX 2D LVIDd:         5.50 cm  Diastology LVIDs:         2.40 cm  LV e' medial:    6.64 cm/s LV PW:         1.20 cm  LV E/e' medial:  10.5 LV IVS:        1.00 cm  LV e' lateral:   10.00 cm/s LVOT diam:     2.10 cm  LV E/e' lateral: 7.0 LV SV:         89 LV SV Index:   35 LVOT Area:     3.46 cm  RIGHT VENTRICLE RV S prime:     18.30 cm/s TAPSE (M-mode): 2.2 cm LEFT ATRIUM              Index       RIGHT ATRIUM           Index LA diam:        4.00 cm  1.57 cm/m  RA Area:     11.80 cm LA Vol (A2C):   86.9 ml  34.19 ml/m RA Volume:   23.00 ml  9.05 ml/m LA Vol (A4C):   101.0 ml 39.73 ml/m LA Biplane Vol: 95.5 ml  37.57 ml/m  AORTIC VALVE AV Area (Vmax):    2.30 cm AV Area (Vmean):   2.41 cm AV Area (VTI):     3.03 cm AV Vmax:           173.20 cm/s AV Vmean:          117.602 cm/s AV VTI:            0.294 m AV Peak Grad:      12.0 mmHg AV Mean Grad:      6.3 mmHg LVOT Vmax:         115.00 cm/s LVOT Vmean:        81.700 cm/s LVOT VTI:          0.257 m LVOT/AV VTI ratio: 0.87  AORTA Ao Root diam: 3.80 cm MITRAL VALVE MV Area (PHT): 2.10 cm    SHUNTS MV Decel Time: 361 msec    Systemic VTI:  0.26 m MV E velocity: 69.90 cm/s  Systemic Diam: 2.10 cm MV A velocity: 84.70 cm/s MV E/A ratio:  0.83 Carlyle Dolly MD Electronically signed by Carlyle Dolly MD Signature Date/Time: 05/15/2020/4:35:00 PM    Final    Disposition   Pt is being discharged home today in good condition.  Follow-up Plans & Appointments     Follow-up Information      Erby Pian, MD Follow up on 05/22/2020.   Specialty: Specialist Why: 9:00 am Contact information: [place] [address] [address], [facility] Follow up.   Why: Oxygen-portable tank to be delivered to the room prior to transition home.  Contact information: [place] [address] [phone]         Sueanne Margarita, MD Follow up on 05/29/2020.   Specialty: Cardiology Why: 3:00 PM for [facility] - will follow in [place]  after this appt Contact information: [address] [address] [phone]                Discharge Instructions     Diet - low sodium heart healthy   Complete by: As directed    Discharge instructions   Complete by: As directed    No driving for 2 days. No lifting over 5 lbs for 1 week. No sexual activity for 1 week. You may return to work on 05/25/19 without restrictions. Keep procedure site clean & dry. If you notice increased pain, swelling, bleeding or pus, call/return!  You may shower, but no soaking baths/hot tubs/pools for 1 week.   Increase activity slowly   Complete by: As directed        Discharge Medications   Allergies as of 05/18/2020       Reactions   Hydroxychloroquine Dermatitis   Skin Rash        Medication List     TAKE these medications    acetaminophen 500 MG tablet Commonly known as: TYLENOL Take 1,000 mg by mouth every 6 (six) hours as needed for moderate pain.   albuterol 108 (90 Base) MCG/ACT inhaler Commonly known as: VENTOLIN HFA Inhale 1 puff into the lungs every 6 (six) hours as needed for wheezing or shortness of breath.   aspirin EC 81 MG tablet Take 1 tablet (81 mg total) by mouth daily with breakfast.   atorvastatin 40 MG tablet Commonly known as: LIPITOR One half daily What changed:  how much to take how to take this when to take this additional instructions   CENTRUM ADULTS PO Take 1 tablet by mouth  daily.   clopidogrel 75 MG tablet Commonly known as: PLAVIX TAKE 1 TABLET BY MOUTH ONCE DAILY. What changed: when to take this   doxycycline 100 MG tablet Commonly known as: VIBRA-TABS Take 1 tablet (100 mg total) by mouth 2 (two) times daily.   famotidine 20 MG tablet Commonly known as: Pepcid One after supper What changed:  how much to take how to take this when to take this additional instructions   Fish Oil 1000 MG Cpdr Take 4,000 mg by mouth daily. What changed:  how much to take when to take this   Humira 40 MG/0.4ML Pskt Generic drug: Adalimumab Inject into the skin.   losartan 50 MG tablet Commonly known as: COZAAR Take 1 tablet (50 mg total) by mouth daily.   metoprolol tartrate 25 MG tablet Commonly known as: LOPRESSOR Take 0.5 tablets (12.5 mg total) by mouth 2 (two) times daily.   nitroGLYCERIN 0.4 MG SL tablet Commonly known as: NITROSTAT Place 1 tablet (0.4 mg total) under the tongue every 5 (five) minutesas needed for chest pain.   oxycodone 5 MG capsule Commonly known as: OXY-IR Take one capsule every 4-6 hours as needed for pain. Do not drive or operate machinery while taking this medication. What changed:  how much to take how to take this when to take this reasons to take this additional instructions   pantoprazole 40 MG tablet Commonly known as: PROTONIX TAKE 1 TABLET BY MOUTH ONCE DAILY. What changed: when to take this   predniSONE 5 MG tablet Commonly known as: DELTASONE Take 10 mg by mouth in the morning and at bedtime. Patient stated he takes two of the 5 mg tablets in the morning and evening.               Durable Medical Equipment  (  From admission, onward)           Start     Ordered   05/18/20 1111  DME Oxygen  Once       Question Answer Comment  Length of Need Lifetime   Mode or (Route) Nasal cannula   Frequency Continuous (stationary and portable oxygen unit needed)   Oxygen delivery system Gas       05/18/20 1110   05/18/20 1043  For home use only DME oxygen  Once       Question Answer Comment  Length of Need Lifetime   Mode or (Route) Nasal cannula   Frequency Continuous (stationary and portable oxygen unit needed)   Oxygen delivery system Gas      05/18/20 1042               Outstanding Labs/Studies   OP follow up with pulmonology and rheumatology  Duration of Discharge Encounter   Greater than 30 minutes including physician time.  Signed, Tami Lin Duke, PA 05/18/2020, 11:30 AM  Patient seen and examined.  Plan as discussed in my rounding note for today and outlined above. Jeneen Rinks [facility]  05/18/2020  12:08 PM

## 2020-05-18 NOTE — Progress Notes (Addendum)
Progress Note  Patient Name: Colin Lee Date of Encounter: 05/18/2020  [facility] Cardiologist: Fransico Him, MD   Subjective   Pt eager for discharge.   Inpatient Medications    Scheduled Meds: . aspirin EC  81 mg Oral Daily  . atorvastatin  20 mg Oral q morning  . clopidogrel  75 mg Oral Daily  . famotidine  30 mg Oral QHS  . losartan  50 mg Oral Daily  . metoprolol tartrate  12.5 mg Oral BID  . predniSONE  10 mg Oral BID WC  . sodium chloride flush  3 mL Intravenous Q12H   Continuous Infusions: . sodium chloride Stopped (05/17/20 0500)  . sodium chloride    . nitroGLYCERIN 5 mcg/min (05/15/20 2054)   PRN Meds: sodium chloride, acetaminophen, albuterol, diazepam, nitroGLYCERIN, ondansetron (ZOFRAN) IV, sodium chloride flush   Vital Signs    Vitals:   05/17/20 2131 05/18/20 0630 05/18/20 0830 05/18/20 0836  BP: (!) 131/92 116/73 (!) 160/103 122/73  Pulse: 90 83 (!) 102 97  Resp: 16 16 18    Temp: 98.2 F (36.8 C) 97.7 F (36.5 C) 98.4 F (36.9 C)   TempSrc: Oral Oral Oral   SpO2: 92% 93% 95% 96%  Weight:  (!) 143.3 kg    Height:        Intake/Output Summary (Last 24 hours) at 05/18/2020 0943 Last data filed at 05/18/2020 0800 Gross per 24 hour  Intake 1314.39 ml  Output --  Net 1314.39 ml   Last 3 Weights 05/18/2020 05/17/2020 05/15/2020  Weight (lbs) 315 lb 14.4 oz 317 lb 3.2 oz 318 lb  Weight (kg) 143.291 kg 143.881 kg 144.244 kg      Telemetry    Sinus in the 90s - Personally Reviewed  ECG    No new tracings - Personally Reviewed  Physical Exam   GEN: obese male, no acute distress.   Neck: No JVD Cardiac: RRR, no murmurs, rubs, or gallops.  Respiratory: Clear to auscultation bilaterally. GI: Soft, nontender, non-distended  MS: No edema; No deformity. Neuro:  Nonfocal  Psych: Normal affect  Right radial cath site C/D/I  Labs    High Sensitivity Troponin:   Recent Labs  Lab 05/15/20 1215 05/15/20 1433  TROPONINIHS 13 11       Chemistry Recent Labs  Lab 05/15/20 1215 05/16/20 1738 05/16/20 1752 05/17/20 1342  NA 137 140  139 137 138  K 4.0 4.1  4.2 4.0 3.6  CL 105  --   --  100  CO2 25  --   --  27  GLUCOSE 168*  --   --  171*  BUN 20  --   --  19  CREATININE 0.89  --   --  1.16  CALCIUM 9.0  --   --  8.9  GFRNONAA >60  --   --  >60  ANIONGAP 7  --   --  11     Hematology Recent Labs  Lab 05/15/20 1215 05/16/20 0038 05/16/20 1738 05/16/20 1752 05/17/20 0152  WBC 7.5 9.4  --   --  7.0  RBC 4.28 4.34  --   --  4.49  HGB 11.8* 11.9* 12.6*  12.9* 11.6* 12.7*  HCT 37.7* 38.3* 37.0*  38.0* 34.0* 38.8*  MCV 88.1 88.2  --   --  86.4  MCH 27.6 27.4  --   --  28.3  MCHC 31.3 31.1  --   --  32.7  RDW 14.2 14.2  --   --  14.1  PLT 253 262  --   --  262    BNP Recent Labs  Lab 05/15/20 1215  BNP 87.0     DDimer  Recent Labs  Lab 05/17/20 1342  DDIMER 0.60*     Radiology    CARDIAC CATHETERIZATION  Result Date: 05/16/2020  Previously placed Ost RCA to Prox RCA stent (unknown type) is widely patent.  Mild right heart pressure elevation with mild pulmonary hypertension with PA mean 5 mm. No significant CAD with widely patent stent in the proximal RCA; normal LAD and left circumflex vessels. RECOMMENDATION: Medical therapy for diastolic dysfunction with optimal blood pressure control and weight loss.  With mild pulmonary hypertension, consider evaluation for obstructive sleep apnea.    CT Chest High Resolution  Result Date: 05/17/2020 CLINICAL DATA:  Inpatient.  Evaluate interstitial lung disease. EXAM: CT CHEST WITHOUT CONTRAST TECHNIQUE: Multidetector CT imaging of the chest was performed following the standard protocol without intravenous contrast. High resolution imaging of the lungs, as well as inspiratory and expiratory imaging, was performed. COMPARISON:  05/15/2020 chest radiograph. 10/20/2019 chest CT angiogram. FINDINGS: Cardiovascular: Top-normal heart size. No significant  pericardial effusion/thickening. Three-vessel coronary atherosclerosis. Atherosclerotic nonaneurysmal thoracic aorta. Top-normal caliber main pulmonary artery (3.1 cm diameter), stable. Mediastinum/Nodes: No discrete thyroid nodules. Unremarkable esophagus. No pathologically enlarged axillary, mediastinal or hilar lymph nodes, noting limited sensitivity for the detection of hilar adenopathy on this noncontrast study. Lungs/Pleura: No pneumothorax. No pleural effusions. Mild smooth bilateral pleural thickening with thick calcified curvilinear posterior left lower lobe parenchymal band, slightly more pronounced in the interval. No right-sided calcified pleural plaques. Mild centrilobular and paraseptal emphysema. Moderate patchy confluent subpleural reticulation and ground-glass opacity throughout both lungs with associated mild traction bronchiectasis and architectural distortion. There is a mild basilar predominance to these findings, which appear mildly progressive in the interval with clear progression from more remote chest CT of 12/27/2013. No frank honeycombing. No significant lobular air trapping or evidence of tracheobronchomalacia on the expiration sequence. Upper abdomen: Mild diffuse hepatic steatosis. Simple 2.8 cm posterior left liver cyst. Cholelithiasis. Musculoskeletal: No aggressive appearing focal osseous lesions. Moderate thoracic spondylosis. IMPRESSION: 1. Spectrum of findings compatible with basilar predominant fibrotic interstitial lung disease without frank honeycombing, mildly progressive in the interval with clear progression from more remote chest CT of 12/27/2013 high-resolution chest CT study. 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 [place], Iss 5, [phone], Dec 07 2016. 2. Chronic mild smooth bilateral pleural thickening with thick  calcified parenchymal band at the dependent left lung base. Favor the sequela of remote nonspecific pleural insult (patient had left greater than right pleural effusions on remote 2015 chest CT study). 3. Three-vessel coronary atherosclerosis. 4. Mild diffuse hepatic steatosis. 5. Cholelithiasis. 6. Aortic Atherosclerosis (ICD10-I70.0) and Emphysema (ICD10-J43.9). Electronically Signed   By: Ilona Sorrel M.D.   On: 05/17/2020 19:44    Cardiac Studies   Right and left heart cath 05/16/20:  Previously placed Ost RCA to Prox RCA stent (unknown type) is widely patent.   Mild right heart pressure elevation with mild pulmonary hypertension with PA mean 5 mm.  No significant CAD with widely patent stent in the proximal RCA; normal LAD and left circumflex vessels.  RECOMMENDATION: Medical therapy for diastolic dysfunction with optimal blood pressure control and weight loss.  With mild pulmonary hypertension, consider evaluation for obstructive sleep apnea.  Echo 05/15/20: 1. Left ventricular ejection fraction, by estimation, is 60 to 65%. The  left ventricle has normal function. The left ventricle has no regional  wall motion abnormalities. There is mild left ventricular hypertrophy.  Left ventricular diastolic parameters  are consistent with Grade I diastolic dysfunction (impaired relaxation).  2. Right ventricular systolic function is normal. The right ventricular  size is normal.  3. Left atrial size was mild to moderately dilated.  4. The mitral valve is normal in structure. No evidence of mitral valve  regurgitation. No evidence of mitral stenosis.  5. The aortic valve is tricuspid. There is mild calcification of the  aortic valve. There is mild thickening of the aortic valve. Aortic valve  regurgitation is not visualized. No aortic stenosis is present.   Patient Profile     60 y.o. male with a hx of CAD, prior DES X 2 to RCA, pl. Effusion s/p talc pleurodesis, RA, COPD presented  to [facility] 05/15/20 with chest pain. Cardiac workup has been unimpressive. Pt continued to have DOE. Consulted PCCM for pulmonary workup.  Assessment & Plan    Chest pain CAD - right and left heart cath with patent stent and no obstructive disease - no cardiac etiology for DOE - continue ASA, plavix, lipitor, losartan, and BB   Dyspnea on exertion Emphysema ILD - appreciate PCCM recommendations - CTA yesterday negative for PE, but did show ILD - suspect this is related to his RA    COVID-19 infection - first positive test on 04/10/20 with symptoms --> symptoms resolved and he returned to work - COVID-19 PCR positive on 05/15/20 --> given negative symptomology, presumed positive test was residual from prior infection   Hypertension - continue ARB and BB - pressure well-controlled   Hyperlipidemia with LDL goal < 70 - 10/23/2019: Cholesterol 123; HDL 30; LDL Cholesterol 74; Triglycerides 93; VLDL 19 - continue 20 mg lipitor - consider increasing lipitor to 40 mg   Sinus tachycardia - suspect dyspnea is driving this - CTA chest yesterday with ILD, no PE   Disposition - OK to discharge today after ambulation in halls. Orthostatic vitals negative for hypotension.   For questions or updates, please contact [place] Please consult [email or web address] for contact info under        Signed, Ledora Bottcher, PA  05/18/2020, 9:43 AM    History and all data above reviewed.  Patient examined.  I agree with the findings as above.  No pain.  Sob walking in the hallway and sats dropped.   The patient exam reveals COR:RRR  ,  Lungs: Basilar crackles  ,  Abd: Positive bowel sounds, no rebound no guarding, Ext Trace edema  .  All available labs, radiology testing, previous records reviewed. Agree with documented assessment and plan.   Tachycardia :  Driven by hypoxemia.  Needs follow up of his RA which is the cause of his lung disease per Dr. Elsworth Soho.  We will arrange home O2 with decreased  sats.  Otherwise OK to continue current therapy and discharge home.  He wants to move his cardiac care to our [facility] office.   Jeneen Rinks Ramiel Forti  10:45 AM  05/18/2020

## 2020-05-18 NOTE — Progress Notes (Signed)
  60 year old with recent Covid infection and history of RA/ILD with emphysema, remote smoker  History of exudative left pleural effusion, pleural biopsy 01/2014 with talc pleurodesis in the setting of positive ANA and c-ANCA  -Subsequently diagnosed with rheumatoid arthritis 2 years ago and maintained on prednisone with 26 pound weight gain. -Heavy ex-smoker up to 2 packs/day quit 2002, reviewed prior PFTs from 2015 and 2020 showing severe restriction without much evidence of airway obstruction with ratio of 74. 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  HRCT independently reviewed shows "probable UIP" Significant worsening compared to 2015.  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 Treatment for this would be to intensify his rheumatoid arthritis regimen.  Recommend -he should follow-up with his rheumatologist at [facility] clinic and with Dr. Raul Del. We will need outpatient PFTs and home sleep testing Rheumatoid arthritis regimen can be intensified and if disease progression still occurs then antifibrotic's can be added in the future.  I offered him the possibility of follow-up in our clinic in [place]  He can be discharged today, defer need for diuretics to cardiology.  PCCM available as needed  Kara Mead MD. FCCP. [facility] Pager : [phone]  If no response to pager , please call [phone] until 7 pm After 7:00 pm call [place]  [phone]   05/18/2020

## 2020-05-18 NOTE — TOC Initial Note (Signed)
Transition of Care [facility]) - Initial/Assessment Note    Patient Details  Name: Colin Lee MRN: [id number] Date of Birth: 06-Sep-1960  [facility] CM/SW Contact:    Bethena Roys, RN Phone Number: 05/18/2020, 1:53 PM  Clinical Narrative: Patient presented for chest pain- plan to transition home today with home oxygen. Case Manager spoke with patient regarding agency choice and the patient was agreeable to use [facility].  Referral was sent to [facility]- Case Manager asked RN to do an ambulatory sat and new orders be placed with the actual liter flow. Durable Medical Equipment to be delivered to the room prior to transition home. No futher needs from Case Manager at this time.               Expected Discharge Plan: Home/Self Care Barriers to Discharge: No Barriers Identified   Patient Goals and CMS Choice Patient states their goals for this hospitalization and ongoing recovery are:: to return home   Choice offered to / list presented to : NA  Expected Discharge Plan and Services Expected Discharge Plan: Home/Self Care In-house Referral: NA Discharge Planning Services: CM Consult Post Acute Care Choice: NA Living arrangements for the past 2 months: Single Family Home Expected Discharge Date: 05/18/20               DME Arranged: Oxygen DME Agency: [facility] Date DME Agency Contacted: 05/18/20 Time DME Agency Contacted: 49 Representative spoke with at DME Agency: Freda Munro HH Arranged: NA          Prior Living Arrangements/Services Living arrangements for the past 2 months: [place] Lives with:: Spouse Patient language and need for interpreter reviewed:: Yes Do you feel safe going back to the place where you live?: Yes      Need for Family Participation in Patient Care: Yes (Comment) Care giver support system in place?: Yes (comment)   Criminal Activity/Legal Involvement Pertinent to Current Situation/Hospitalization: No - Comment as  needed  Activities of Daily Living Home Assistive Devices/Equipment: None ADL Screening (condition at time of admission) Patient's cognitive ability adequate to safely complete daily activities?: Yes Is the patient deaf or have difficulty hearing?: No Does the patient have difficulty seeing, even when wearing glasses/contacts?: No Does the patient have difficulty concentrating, remembering, or making decisions?: No Patient able to express need for assistance with ADLs?: Yes Does the patient have difficulty dressing or bathing?: No Independently performs ADLs?: Yes (appropriate for developmental age) Does the patient have difficulty walking or climbing stairs?: No Weakness of Legs: Both Weakness of Arms/Hands: Both  Permission Sought/Granted Permission sought to share information with : Case Manager,Facility Contact Representative,Family Supports Permission granted to share information with : Yes, Verbal Permission Granted     Permission granted to share info w AGENCY: [facility]        Emotional Assessment Appearance:: Appears stated age Attitude/Demeanor/Rapport: Engaged Affect (typically observed): Appropriate Orientation: : Oriented to Situation,Oriented to  Time,Oriented to Place,Oriented to Self   Psych Involvement: No (comment)  Admission diagnosis:  Chest pain [R07.9] Chest pain, unspecified type [R07.9] Patient Active Problem List   Diagnosis Date Noted  . ILD (interstitial lung disease) ([place])   . 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 Rash and nonspecific skin eruption   . GERD (gastroesophageal reflux disease) 10/21/2019  . COPD (chronic obstructive pulmonary disease) ([place]) 10/21/2019  . Bacteremia due to Gram-positive bacteria 10/21/2019  . Cellulitis and abscess of right leg with Bacteremia 10/21/2019  . Bacteremia 10/21/2019  .  Acute febrile illness 10/20/2019  . Morbid obesity due to excess calories ([place]) 04/01/2019  . Claudication ([place]) 04/15/2016   . CAD (coronary artery disease) 06/20/2014  . Dyslipidemia 06/20/2014  . Elevated troponin   . Stented coronary artery   . Myalgia 06/03/2014  . Arthralgia 06/03/2014  . Normocytic anemia 06/03/2014  . Chest pain 05/31/2014  . Shortness of breath   . Restless leg syndrome 12/28/2013  . Cholelithiases 10/26/2013  . Hypertension 10/06/2013   PCP:  [name], MD Pharmacy:   [place], [place] - [address] [place] [address] Phone: [phone] Fax: [phone]      Readmission Risk Interventions No flowsheet data found.

## 2020-05-23 ENCOUNTER — Other Ambulatory Visit: Payer: Self-pay

## 2020-05-23 ENCOUNTER — Encounter (INDEPENDENT_AMBULATORY_CARE_PROVIDER_SITE_OTHER): Payer: Self-pay | Admitting: Family Medicine

## 2020-05-23 ENCOUNTER — Ambulatory Visit (INDEPENDENT_AMBULATORY_CARE_PROVIDER_SITE_OTHER): Payer: BC Managed Care – PPO | Admitting: Family Medicine

## 2020-05-23 VITALS — BP 162/84 | HR 83 | Temp 99.1°F | Resp 98 | Ht 70.0 in | Wt 319.0 lb

## 2020-05-23 DIAGNOSIS — Z9189 Other specified personal risk factors, not elsewhere classified: Secondary | ICD-10-CM

## 2020-05-23 DIAGNOSIS — R5383 Other fatigue: Secondary | ICD-10-CM | POA: Diagnosis not present

## 2020-05-23 DIAGNOSIS — I10 Essential (primary) hypertension: Secondary | ICD-10-CM

## 2020-05-23 DIAGNOSIS — Z1331 Encounter for screening for depression: Secondary | ICD-10-CM

## 2020-05-23 DIAGNOSIS — Z6841 Body Mass Index (BMI) 40.0 and over, adult: Secondary | ICD-10-CM

## 2020-05-23 DIAGNOSIS — J449 Chronic obstructive pulmonary disease, unspecified: Secondary | ICD-10-CM

## 2020-05-23 DIAGNOSIS — R739 Hyperglycemia, unspecified: Secondary | ICD-10-CM

## 2020-05-23 DIAGNOSIS — Z0289 Encounter for other administrative examinations: Secondary | ICD-10-CM

## 2020-05-23 DIAGNOSIS — E559 Vitamin D deficiency, unspecified: Secondary | ICD-10-CM

## 2020-05-23 DIAGNOSIS — J439 Emphysema, unspecified: Secondary | ICD-10-CM

## 2020-05-23 DIAGNOSIS — E785 Hyperlipidemia, unspecified: Secondary | ICD-10-CM | POA: Diagnosis not present

## 2020-05-23 DIAGNOSIS — R0602 Shortness of breath: Secondary | ICD-10-CM

## 2020-05-24 LAB — COMPREHENSIVE METABOLIC PANEL
ALT: 79 IU/L — ABNORMAL HIGH (ref 0–44)
AST: 45 IU/L — ABNORMAL HIGH (ref 0–40)
Albumin/Globulin Ratio: 1.5 (ref 1.2–2.2)
Albumin: 4.3 g/dL (ref 3.8–4.9)
Alkaline Phosphatase: 66 IU/L (ref 44–121)
BUN/Creatinine Ratio: 19 (ref 9–20)
BUN: 17 mg/dL (ref 6–24)
Bilirubin Total: 0.3 mg/dL (ref 0.0–1.2)
CO2: 22 mmol/L (ref 20–29)
Calcium: 9.5 mg/dL (ref 8.7–10.2)
Chloride: 100 mmol/L (ref 96–106)
Creatinine, Ser: 0.9 mg/dL (ref 0.76–1.27)
GFR calc Af Amer: 109 mL/min/{1.73_m2} (ref >59)
GFR calc non Af Amer: 94 mL/min/{1.73_m2} (ref >59)
Globulin, Total: 2.9 g/dL (ref 1.5–4.5)
Glucose: 110 mg/dL — ABNORMAL HIGH (ref 65–99)
Potassium: 4.7 mmol/L (ref 3.5–5.2)
Sodium: 141 mmol/L (ref 134–144)
Total Protein: 7.2 g/dL (ref 6.0–8.5)

## 2020-05-24 LAB — LIPID PANEL WITH LDL/HDL RATIO
Cholesterol, Total: 146 mg/dL (ref 100–199)
HDL: 46 mg/dL (ref >39)
LDL Chol Calc (NIH): 80 mg/dL (ref 0–99)
LDL/HDL Ratio: 1.7 ratio (ref 0.0–3.6)
Triglycerides: 110 mg/dL (ref 0–149)
VLDL Cholesterol Cal: 20 mg/dL (ref 5–40)

## 2020-05-24 LAB — T4: T4, Total: 6.8 ug/dL (ref 4.5–12.0)

## 2020-05-24 LAB — VITAMIN D 25 HYDROXY (VIT D DEFICIENCY, FRACTURES): Vit D, 25-Hydroxy: 27.7 ng/mL — ABNORMAL LOW (ref 30.0–100.0)

## 2020-05-24 LAB — T3: T3, Total: 107 ng/dL (ref 71–180)

## 2020-05-24 LAB — INSULIN, RANDOM: INSULIN: 51.4 u[IU]/mL — ABNORMAL HIGH (ref 2.6–24.9)

## 2020-05-24 LAB — FOLATE: Folate: >20 ng/mL (ref >3.0)

## 2020-05-24 LAB — HEMOGLOBIN A1C
Est. average glucose Bld gHb Est-mCnc: 140 mg/dL
Hgb A1c MFr Bld: 6.5 % — ABNORMAL HIGH (ref 4.8–5.6)

## 2020-05-24 LAB — VITAMIN B12: Vitamin B-12: 415 pg/mL (ref 232–1245)

## 2020-05-25 NOTE — Progress Notes (Signed)
Chief Complaint:   OBESITY Colin Lee (MR# [id number]) is a 60 y.o. male who presents for evaluation and treatment of obesity and related comorbidities. Current BMI is Body mass index is 45.77 kg/m. Colin Lee has been struggling with his weight for many years and has been unsuccessful in either losing weight, maintaining weight loss, or reaching his healthy weight goal.  Colin Lee has been on prednisone chronically for the last year due to COPD and RA, and he has gained >30 lbs during this time.  Colin Lee is currently in the action stage of change and ready to dedicate time achieving and maintaining a healthier weight. Colin Lee is interested in becoming our patient and working on intensive lifestyle modifications including (but not limited to) diet and exercise for weight loss.  Colin Lee's habits were reviewed today and are as follows: His family eats meals together, he thinks his family will eat healthier with him, his desired weight loss is 89 lbs, he started gaining weight in the last 5 years, his heaviest weight ever was 325 pounds, he has significant food cravings issues, he snacks frequently in the evenings, he is frequently drinking liquids with calories, he has problems with excessive hunger and he frequently eats larger portions than normal.  Depression Screen Colin Lee's Food and Mood (modified PHQ-9) score was 7.  Depression screen Colin Lee 2/9 05/23/2020  Decreased Interest 3  Down, Depressed, Hopeless 0  PHQ - 2 Score 3  Altered sleeping 1  Tired, decreased energy 3  Change in appetite 0  Feeling bad or failure about yourself  0  Trouble concentrating 0  Moving slowly or fidgety/restless 0  Suicidal thoughts 0  PHQ-9 Score 7  Difficult doing work/chores Very difficult   Subjective:   1. Other fatigue Colin Lee admits to daytime somnolence and admits to waking up still tired. Patent has a history of symptoms of daytime fatigue and morning headache. Colin Lee generally  gets 5 hours of sleep per night, and states that he has difficulty falling asleep. Snoring is present. Apneic episodes are not present. Epworth Sleepiness Score is 9.  2. Chronic obstructive pulmonary disease, unspecified COPD type (Colin Lee) Colin Lee is on oxygen intermittently with a new diagnosis of pulmonary fibrosis and RA. He is on chronic steroids which is contributing to his weight gain.  3. Primary hypertension Colin Lee didn't take his morning medications. His blood pressure is elevated, and he is ready to work on diet and weight loss.  4. Hyperlipidemia, unspecified hyperlipidemia type Colin Lee has a history of CAD, and he is on Lipitor. He is ready to work on diet and weight loss.  5. Hyperglycemia Colin Lee has a history of elevated A1c and fasting glucose readings.  6. At risk for diabetes mellitus Colin Lee is at higher than average risk for developing diabetes due to obesity.   Assessment/Plan:   1. Other fatigue Colin Lee does feel that his weight is causing his energy to be lower than it should be. Fatigue may be related to obesity, depression or many other causes. Labs will be ordered, and in the meanwhile, Colin Lee will focus on self care including making healthy food choices, increasing physical activity and focusing on stress reduction.  - Vitamin B12 - Comprehensive metabolic panel - Folate - T3 - T4 - VITAMIN D 25 Hydroxy (Vit-D Deficiency, Fractures)  2. Chronic obstructive pulmonary disease, unspecified COPD type ([place]) Colin Lee will continue to work on diet and weight loss with the goal to be able to decrease oxygen and steroid use.  3.  Primary hypertension Colin Lee will start his Category 4 plan, and will work on healthy weight loss to improve blood pressure control. We will watch for signs of hypotension as he continues his lifestyle modifications. We will check labs today.  4. Hyperlipidemia, unspecified hyperlipidemia type Cardiovascular risk and specific  lipid/LDL goals reviewed.  We discussed several lifestyle modifications today. We will check labs today. Colin Lee will continue statin, and will start his Category 4 plan and weight loss efforts. Orders and follow up as documented in patient record.   - Lipid Panel With LDL/HDL Ratio  5. Hyperglycemia Fasting labs will be obtained today, and results with be discussed with Colin Lee in 2 weeks at his follow up visit. In the meanwhile Colin Lee will start his Category 4 plan and will work on weight loss efforts.  - Hemoglobin A1c - Insulin, random  6. Screening for depression Colin Lee had a positive depression screening. Depression is commonly associated with obesity and often results in emotional eating behaviors. We will monitor this closely and work on CBT to help improve the non-hunger eating patterns. Referral to Psychology may be required if no improvement is seen as he continues in our clinic.  7. At risk for diabetes mellitus Colin Lee was given approximately 30 minutes of diabetes education and counseling today. We discussed intensive lifestyle modifications today with an emphasis on weight loss as well as increasing exercise and decreasing simple carbohydrates in his diet. We also reviewed medication options with an emphasis on risk versus benefit of those discussed.   Repetitive spaced learning was employed today to elicit superior memory formation and behavioral change.  8. Class 3 severe obesity with serious comorbidity and body mass index (BMI) of 45.0 to 49.9 in adult, unspecified obesity type Colin Lee) Colin Lee is currently in the action stage of change and his goal is to continue with weight loss efforts. I recommend Colin Lee begin the structured treatment plan as follows:  He has agreed to the Category 4 Plan.  Exercise goals: No exercise has been prescribed for now, while we work on nutritional changes.  Behavioral modification strategies: decreasing eating out and no skipping  meals.  He was informed of the importance of frequent follow-up visits to maximize his success with intensive lifestyle modifications for his multiple health conditions. He was informed we would discuss his lab results at his next visit unless there is a critical issue that needs to be addressed sooner. Colin Lee agreed to keep his next visit at the agreed upon time to discuss these results.  Objective:   Blood pressure (!) 162/84, pulse 83, temperature 99.1 F (37.3 C), resp. rate (!) 98, height 5\' 10"  (1.778 m), weight (!) 319 lb (144.7 kg). Body mass index is 45.77 kg/m.  EKG: Normal sinus rhythm, rate 80 BPM.  Indirect Calorimeter completed today shows a VO2 of 461 and a REE of 3210.  His calculated basal metabolic rate is 3662 thus his basal metabolic rate is better than expected.  General: Cooperative, alert, well developed, in no acute distress. HEENT: Conjunctivae and lids unremarkable. Cardiovascular: Regular rhythm.  Lungs: Normal work of breathing. Neurologic: No focal deficits.   Lab Results  Component Value Date   CREATININE 0.90 05/23/2020   BUN 17 05/23/2020   NA 141 05/23/2020   K 4.7 05/23/2020   CL 100 05/23/2020   CO2 22 05/23/2020   Lab Results  Component Value Date   ALT 79 (H) 05/23/2020   AST 45 (H) 05/23/2020   ALKPHOS 66 05/23/2020  BILITOT 0.3 05/23/2020   Lab Results  Component Value Date   HGBA1C 6.5 (H) 05/23/2020   HGBA1C 6.1 (H) 10/06/2013   Lab Results  Component Value Date   INSULIN 51.4 (H) 05/23/2020   Lab Results  Component Value Date   TSH 1.707 05/17/2020   Lab Results  Component Value Date   CHOL 146 05/23/2020   HDL 46 05/23/2020   LDLCALC 80 05/23/2020   TRIG 110 05/23/2020   CHOLHDL 4.1 10/23/2019   Lab Results  Component Value Date   WBC 7.0 05/17/2020   HGB 12.7 (L) 05/17/2020   HCT 38.8 (L) 05/17/2020   MCV 86.4 05/17/2020   PLT 262 05/17/2020   Lab Results  Component Value Date   IRON 38 (L) 06/16/2017    TIBC 312 10/06/2013   FERRITIN 71.0 06/16/2017   Attestation Statements:   Reviewed by clinician on day of visit: allergies, medications, problem list, medical history, surgical history, family history, social history, and previous encounter notes.   I, Trixie Dredge, am acting as transcriptionist for Dennard Nip, MD.  I have reviewed the above documentation for accuracy and completeness, and I agree with the above. - Dennard Nip, MD

## 2020-05-26 ENCOUNTER — Other Ambulatory Visit: Payer: Self-pay | Admitting: Cardiology

## 2020-05-29 ENCOUNTER — Ambulatory Visit: Payer: BC Managed Care – PPO | Admitting: Cardiology

## 2020-06-06 ENCOUNTER — Other Ambulatory Visit: Payer: Self-pay

## 2020-06-06 ENCOUNTER — Ambulatory Visit (INDEPENDENT_AMBULATORY_CARE_PROVIDER_SITE_OTHER): Payer: BC Managed Care – PPO | Admitting: Family Medicine

## 2020-06-06 ENCOUNTER — Encounter (INDEPENDENT_AMBULATORY_CARE_PROVIDER_SITE_OTHER): Payer: Self-pay | Admitting: Family Medicine

## 2020-06-06 VITALS — BP 134/82 | HR 78 | Temp 98.1°F | Ht 70.0 in | Wt 311.0 lb

## 2020-06-06 DIAGNOSIS — Z6841 Body Mass Index (BMI) 40.0 and over, adult: Secondary | ICD-10-CM

## 2020-06-06 DIAGNOSIS — E559 Vitamin D deficiency, unspecified: Secondary | ICD-10-CM

## 2020-06-06 DIAGNOSIS — E1169 Type 2 diabetes mellitus with other specified complication: Secondary | ICD-10-CM | POA: Diagnosis not present

## 2020-06-06 DIAGNOSIS — Z9189 Other specified personal risk factors, not elsewhere classified: Secondary | ICD-10-CM | POA: Diagnosis not present

## 2020-06-06 MED ORDER — VITAMIN D (ERGOCALCIFEROL) 1.25 MG (50000 UNIT) PO CAPS: 50000.0000 [IU] | ORAL_CAPSULE | ORAL | 0 refills | Status: DC

## 2020-06-06 MED ORDER — METFORMIN HCL 500 MG PO TABS
500.0000 mg | ORAL_TABLET | Freq: Every morning | ORAL | 0 refills | Status: DC
Start: 2020-06-06 — End: 2020-07-04

## 2020-06-07 NOTE — Progress Notes (Signed)
Chief Complaint:   OBESITY Colin Lee is here to discuss his progress with his obesity treatment plan along with follow-up of his obesity related diagnoses. Colin Lee is on the Category 4 Plan and states he is following his eating plan approximately 80-85% of the time. Colin Lee states he is doing 0 minutes 0 times per week.  Today's visit was #: 2 Starting weight: 319 lbs Starting date: 05/23/2020 Today's weight: 311 lbs Today's date: 06/06/2020 Total lbs lost to date: 8 Total lbs lost since last in-office visit: 8  Interim History: Colin Lee has done well with weight loss. He deviated from his plan with other meals which may have decreased his calories too low.  Subjective:   1. Vitamin D deficiency Colin Lee has a new diagnosis of Vit D deficiency. He is not on Vit D, and she notes fatigue. He also has a history of chronic steroid use and is at high risk of osteoporosis.   2. Type 2 diabetes mellitus with other specified complication, without long-term current use of insulin (HCC) Colin Lee's A1c is 6.5 and he has a new diagnosis of diabetes mellitus. His fasting BGs at home have run in the 120's. He notes polyphagia. I discussed labs with the patient today.  3. At risk for heart disease Colin Lee is at a higher than average risk for cardiovascular disease due to obesity.   Assessment/Plan:   1. Vitamin D deficiency Low Vitamin D level contributes to fatigue and are associated with obesity, breast, and colon cancer. Colin Lee agreed to start prescription Vitamin D 50,000 IU every week with no refills. He will follow-up for routine testing of Vitamin D, at least 2-3 times per year to avoid over-replacement.  - Vitamin D, Ergocalciferol, (DRISDOL) 1.25 MG (50000 UNIT) CAPS capsule; Take 1 capsule (50,000 Units total) by mouth every 7 (seven) days.  Dispense: 4 capsule; Refill: 0  2. Type 2 diabetes mellitus with other specified complication, without long-term current use of insulin  (HCC) Good blood sugar control is important to decrease the likelihood of diabetic complications such as nephropathy, neuropathy, limb loss, blindness, coronary artery disease, and death. Intensive lifestyle modification including diet, exercise and weight loss are the first line of treatment for diabetes. Colin Lee agreed to start metformin 500 mg q AM, with food, with no refills. He will continue with diet and exercise.   - metFORMIN (GLUCOPHAGE) 500 MG tablet; Take 1 tablet (500 mg total) by mouth in the morning.  Dispense: 30 tablet; Refill: 0  3. At risk for heart disease Colin Lee was given approximately 15 minutes of coronary artery disease prevention counseling today. He is 60 y.o. male and has risk factors for heart disease including obesity. We discussed intensive lifestyle modifications today with an emphasis on specific weight loss instructions and strategies.   Repetitive spaced learning was employed today to elicit superior memory formation and behavioral change.  4. Class 3 severe obesity with serious comorbidity and body mass index (BMI) of 40.0 to 44.9 in adult, unspecified obesity type [facility]) Colin Lee is currently in the action stage of change. As such, his goal is to continue with weight loss efforts. He has agreed to the Category 4 Plan or keeping a food journal and adhering to recommended goals of 1400-1700 calories and 100+ grams of protein daily.   Behavioral modification strategies: increasing lean protein intake, decreasing simple carbohydrates and meal planning and cooking strategies.  Colin Lee has agreed to follow-up with our clinic in 2 weeks. He was informed of the importance  of frequent follow-up visits to maximize his success with intensive lifestyle modifications for his multiple health conditions.   Objective:   Blood pressure 134/82, pulse 78, temperature 98.1 F (36.7 C), height 5\' 10"  (1.778 m), weight (!) 311 lb (141.1 kg), SpO2 98 %. Body mass index is 44.62  kg/m.  General: Cooperative, alert, well developed, in no acute distress. HEENT: Conjunctivae and lids unremarkable. Cardiovascular: Regular rhythm.  Lungs: Normal work of breathing. Neurologic: No focal deficits.   Lab Results  Component Value Date   CREATININE 0.90 05/23/2020   BUN 17 05/23/2020   NA 141 05/23/2020   K 4.7 05/23/2020   CL 100 05/23/2020   CO2 22 05/23/2020   Lab Results  Component Value Date   ALT 79 (H) 05/23/2020   AST 45 (H) 05/23/2020   ALKPHOS 66 05/23/2020   BILITOT 0.3 05/23/2020   Lab Results  Component Value Date   HGBA1C 6.5 (H) 05/23/2020   HGBA1C 6.1 (H) 10/06/2013   Lab Results  Component Value Date   INSULIN 51.4 (H) 05/23/2020   Lab Results  Component Value Date   TSH 1.707 05/17/2020   Lab Results  Component Value Date   CHOL 146 05/23/2020   HDL 46 05/23/2020   LDLCALC 80 05/23/2020   TRIG 110 05/23/2020   CHOLHDL 4.1 10/23/2019   Lab Results  Component Value Date   WBC 7.0 05/17/2020   HGB 12.7 (L) 05/17/2020   HCT 38.8 (L) 05/17/2020   MCV 86.4 05/17/2020   PLT 262 05/17/2020   Lab Results  Component Value Date   IRON 38 (L) 06/16/2017   TIBC 312 10/06/2013   FERRITIN 71.0 06/16/2017   Attestation Statements:   Reviewed by clinician on day of visit: allergies, medications, problem list, medical history, surgical history, family history, social history, and previous encounter notes.   I, Trixie Dredge, am acting as transcriptionist for Dennard Nip, MD.  I have reviewed the above documentation for accuracy and completeness, and I agree with the above. -  Dennard Nip, MD

## 2020-06-20 ENCOUNTER — Other Ambulatory Visit: Payer: Self-pay

## 2020-06-20 ENCOUNTER — Encounter (INDEPENDENT_AMBULATORY_CARE_PROVIDER_SITE_OTHER): Payer: Self-pay | Admitting: Physician Assistant

## 2020-06-20 ENCOUNTER — Ambulatory Visit (INDEPENDENT_AMBULATORY_CARE_PROVIDER_SITE_OTHER): Payer: BC Managed Care – PPO | Admitting: Physician Assistant

## 2020-06-20 VITALS — BP 129/80 | HR 74 | Temp 98.0°F | Ht 70.0 in | Wt 304.0 lb

## 2020-06-20 DIAGNOSIS — E1169 Type 2 diabetes mellitus with other specified complication: Secondary | ICD-10-CM

## 2020-06-20 DIAGNOSIS — Z6841 Body Mass Index (BMI) 40.0 and over, adult: Secondary | ICD-10-CM

## 2020-06-20 DIAGNOSIS — E559 Vitamin D deficiency, unspecified: Secondary | ICD-10-CM

## 2020-06-26 NOTE — Progress Notes (Signed)
Chief Complaint:   OBESITY Colin Lee is here to discuss his progress with his obesity treatment plan along with follow-up of his obesity related diagnoses. Colin Lee is on the Category 4 Plan or keeping a food journal and adhering to recommended goals of 1400-1700 calories and 100+ grams of protein daily and states he is following his eating plan approximately 90-95% of the time. Colin Lee states he is doing 0 minutes 0 times per week.  Today's visit was #: 3 Starting weight: 319 lbs Starting date: 05/23/2020 Today's weight: 304 lbs Today's date: 06/20/2020 Total lbs lost to date: 15 Total lbs lost since last in-office visit: 7  Interim History: Colin Lee did very well with weight loss. He is averaging about 1400 calories and 90 grams of protein daily. His hunger is controlled.  Subjective:   1. Type 2 diabetes mellitus with other specified complication, without long-term current use of insulin ([place]) Colin Lee is on metformin, and he denies polyphagia or hypoglycemia.  2. Vitamin D deficiency Colin Lee is on Vit D weekly, and he is tolerating it well.  Assessment/Plan:   1. Type 2 diabetes mellitus with other specified complication, without long-term current use of insulin (HCC) Good blood sugar control is important to decrease the likelihood of diabetic complications such as nephropathy, neuropathy, limb loss, blindness, coronary artery disease, and death. Intensive lifestyle modification including diet, exercise and weight loss are the first line of treatment for diabetes. Colin Lee will continue his medications and weight loss.  2. Vitamin D deficiency Low Vitamin D level contributes to fatigue and are associated with obesity, breast, and colon cancer. Colin Lee agreed to continue taking prescription Vitamin D 50,000 IU every week and will follow-up for routine testing of Vitamin D, at least 2-3 times per year to avoid over-replacement.  3. Class 3 severe obesity with serious  comorbidity and body mass index (BMI) of 40.0 to 44.9 in adult, unspecified obesity type [facility]) Colin Lee is currently in the action stage of change. As such, his goal is to continue with weight loss efforts. He has agreed to the Category 4 Plan or keeping a food journal and adhering to recommended goals of 1500-1700 calories and 115 grams of protein daily.   Exercise goals: No exercise has been prescribed at this time.  Behavioral modification strategies: increasing lean protein intake and meal planning and cooking strategies.  Colin Lee has agreed to follow-up with our clinic in 2 weeks. He was informed of the importance of frequent follow-up visits to maximize his success with intensive lifestyle modifications for his multiple health conditions.   Objective:   Blood pressure 129/80, pulse 74, temperature 98 F (36.7 C), height 5\' 10"  (1.778 m), weight (!) 304 lb (137.9 kg), SpO2 98 %. Body mass index is 43.62 kg/m.  General: Cooperative, alert, well developed, in no acute distress. HEENT: Conjunctivae and lids unremarkable. Cardiovascular: Regular rhythm.  Lungs: Normal work of breathing. Neurologic: No focal deficits.   Lab Results  Component Value Date   CREATININE 0.90 05/23/2020   BUN 17 05/23/2020   NA 141 05/23/2020   K 4.7 05/23/2020   CL 100 05/23/2020   CO2 22 05/23/2020   Lab Results  Component Value Date   ALT 79 (H) 05/23/2020   AST 45 (H) 05/23/2020   ALKPHOS 66 05/23/2020   BILITOT 0.3 05/23/2020   Lab Results  Component Value Date   HGBA1C 6.5 (H) 05/23/2020   HGBA1C 6.1 (H) 10/06/2013   Lab Results  Component Value Date  INSULIN 51.4 (H) 05/23/2020   Lab Results  Component Value Date   TSH 1.707 05/17/2020   Lab Results  Component Value Date   CHOL 146 05/23/2020   HDL 46 05/23/2020   LDLCALC 80 05/23/2020   TRIG 110 05/23/2020   CHOLHDL 4.1 10/23/2019   Lab Results  Component Value Date   WBC 7.0 05/17/2020   HGB 12.7 (L) 05/17/2020    HCT 38.8 (L) 05/17/2020   MCV 86.4 05/17/2020   PLT 262 05/17/2020   Lab Results  Component Value Date   IRON 38 (L) 06/16/2017   TIBC 312 10/06/2013   FERRITIN 71.0 06/16/2017   Attestation Statements:   Reviewed by clinician on day of visit: allergies, medications, problem list, medical history, surgical history, family history, social history, and previous encounter notes.  Time spent on visit including pre-visit chart review and post-visit care and charting was 30 minutes.    Wilhemena Durie, am acting as transcriptionist for [employer], PA-C.  I have reviewed the above documentation for accuracy and completeness, and I agree with the above. Abby Potash, PA-C

## 2020-07-04 ENCOUNTER — Encounter (INDEPENDENT_AMBULATORY_CARE_PROVIDER_SITE_OTHER): Payer: Self-pay | Admitting: Family Medicine

## 2020-07-04 ENCOUNTER — Other Ambulatory Visit (INDEPENDENT_AMBULATORY_CARE_PROVIDER_SITE_OTHER): Payer: Self-pay

## 2020-07-04 DIAGNOSIS — E559 Vitamin D deficiency, unspecified: Secondary | ICD-10-CM

## 2020-07-04 DIAGNOSIS — E1169 Type 2 diabetes mellitus with other specified complication: Secondary | ICD-10-CM

## 2020-07-04 MED ORDER — VITAMIN D (ERGOCALCIFEROL) 1.25 MG (50000 UNIT) PO CAPS: 50000.0000 [IU] | ORAL_CAPSULE | ORAL | 0 refills | Status: DC

## 2020-07-04 MED ORDER — METFORMIN HCL 500 MG PO TABS: 500.0000 mg | ORAL_TABLET | Freq: Every morning | ORAL | 0 refills | Status: DC

## 2020-07-04 NOTE — Telephone Encounter (Signed)
Ok to fill both thanks

## 2020-07-04 NOTE — Telephone Encounter (Signed)
Last seen by you with no meds refilled. Next appt is with Dr Leafy Ro on 4/4. Last refill on both was 3/1. Please advise

## 2020-07-04 NOTE — Telephone Encounter (Signed)
Last seen by Abby Potash, PA-C.

## 2020-07-10 ENCOUNTER — Other Ambulatory Visit: Payer: Self-pay

## 2020-07-10 ENCOUNTER — Encounter (INDEPENDENT_AMBULATORY_CARE_PROVIDER_SITE_OTHER): Payer: Self-pay | Admitting: Family Medicine

## 2020-07-10 ENCOUNTER — Ambulatory Visit (INDEPENDENT_AMBULATORY_CARE_PROVIDER_SITE_OTHER): Payer: BC Managed Care – PPO | Admitting: Family Medicine

## 2020-07-10 VITALS — BP 135/76 | HR 100 | Temp 97.8°F | Ht 70.0 in | Wt 299.0 lb

## 2020-07-10 DIAGNOSIS — Z6841 Body Mass Index (BMI) 40.0 and over, adult: Secondary | ICD-10-CM | POA: Diagnosis not present

## 2020-07-10 DIAGNOSIS — E1169 Type 2 diabetes mellitus with other specified complication: Secondary | ICD-10-CM

## 2020-07-19 ENCOUNTER — Encounter (INDEPENDENT_AMBULATORY_CARE_PROVIDER_SITE_OTHER): Payer: Self-pay | Admitting: Family Medicine

## 2020-07-20 ENCOUNTER — Other Ambulatory Visit (INDEPENDENT_AMBULATORY_CARE_PROVIDER_SITE_OTHER): Payer: Self-pay

## 2020-07-20 DIAGNOSIS — E559 Vitamin D deficiency, unspecified: Secondary | ICD-10-CM

## 2020-07-20 MED ORDER — VITAMIN D (ERGOCALCIFEROL) 1.25 MG (50000 UNIT) PO CAPS: 50000.0000 [IU] | ORAL_CAPSULE | ORAL | 0 refills | Status: DC

## 2020-07-20 NOTE — Telephone Encounter (Signed)
Ok x 1

## 2020-07-25 NOTE — Progress Notes (Signed)
Chief Complaint:   OBESITY Colin Lee is here to discuss his progress with his obesity treatment plan along with follow-up of his obesity related diagnoses. Colin Lee is on the Category 4 Plan or keeping a food journal and adhering to recommended goals of 1500-1700 calories and 115 grams of protein daily and states he is following his eating plan approximately 80% of the time. Colin Lee states he is doing 0 minutes 0 times per week.  Today's visit was #: 4 Starting weight: 319 lbs Starting date: 05/23/2020 Today's weight: 299 lbs Today's date: 07/10/2020 Total lbs lost to date: 20 Total lbs lost since last in-office visit: 5  Interim History: Colin Lee continues to do well with weight loss. He is journaling well but he is struggling to meet his protein goals.  Subjective:   1. Type 2 diabetes mellitus with other specified complication, without long-term current use of insulin (Colin Lee) Colin Lee is working on diet and weight loss, and he is doing well with trying to decrease simple carbohydrates. He is tolerating metformin well.  Assessment/Plan:   1. Type 2 diabetes mellitus with other specified complication, without long-term current use of insulin (HCC) Good blood sugar control is important to decrease the likelihood of diabetic complications such as nephropathy, neuropathy, limb loss, blindness, coronary artery disease, and death. Intensive lifestyle modification including diet, exercise and weight loss are the first line of treatment for diabetes. Colin Lee will continue metformin and diet, and will continue to follow up as directed.  2. Obesity with current BMI of 43.0 Colin Lee is currently in the action stage of change. As such, his goal is to continue with weight loss efforts. He has agreed to keeping a food journal and adhering to recommended goals of 1500-1700 calories and 100+ grams of protein daily.   Higher protein food and recipes were given.  Behavioral modification strategies:  increasing lean protein intake and meal planning and cooking strategies.  Colin Lee has agreed to follow-up with our clinic in 2 to 3 weeks. He was informed of the importance of frequent follow-up visits to maximize his success with intensive lifestyle modifications for his multiple health conditions.   Objective:   Blood pressure 135/76, pulse 100, temperature 97.8 F (36.6 C), height 5\' 10"  (1.778 m), weight 299 lb (135.6 kg), SpO2 96 %. Body mass index is 42.9 kg/m.  General: Cooperative, alert, well developed, in no acute distress. HEENT: Conjunctivae and lids unremarkable. Cardiovascular: Regular rhythm.  Lungs: Normal work of breathing. Neurologic: No focal deficits.   Lab Results  Component Value Date   CREATININE 0.90 05/23/2020   BUN 17 05/23/2020   NA 141 05/23/2020   K 4.7 05/23/2020   CL 100 05/23/2020   CO2 22 05/23/2020   Lab Results  Component Value Date   ALT 79 (H) 05/23/2020   AST 45 (H) 05/23/2020   ALKPHOS 66 05/23/2020   BILITOT 0.3 05/23/2020   Lab Results  Component Value Date   HGBA1C 6.5 (H) 05/23/2020   HGBA1C 6.1 (H) 10/06/2013   Lab Results  Component Value Date   INSULIN 51.4 (H) 05/23/2020   Lab Results  Component Value Date   TSH 1.707 05/17/2020   Lab Results  Component Value Date   CHOL 146 05/23/2020   HDL 46 05/23/2020   LDLCALC 80 05/23/2020   TRIG 110 05/23/2020   CHOLHDL 4.1 10/23/2019   Lab Results  Component Value Date   WBC 7.0 05/17/2020   HGB 12.7 (L) 05/17/2020   HCT 38.8 (  L) 05/17/2020   MCV 86.4 05/17/2020   PLT 262 05/17/2020   Lab Results  Component Value Date   IRON 38 (L) 06/16/2017   TIBC 312 10/06/2013   FERRITIN 71.0 06/16/2017   Attestation Statements:   Reviewed by clinician on day of visit: allergies, medications, problem list, medical history, surgical history, family history, social history, and previous encounter notes.  Time spent on visit including pre-visit chart review and post-visit  care and charting was 33 minutes.    I, Trixie Dredge, am acting as transcriptionist for Dennard Nip, MD.  I have reviewed the above documentation for accuracy and completeness, and I agree with the above. -  Dennard Nip, MD

## 2020-07-26 ENCOUNTER — Other Ambulatory Visit (INDEPENDENT_AMBULATORY_CARE_PROVIDER_SITE_OTHER): Payer: Self-pay | Admitting: Physician Assistant

## 2020-07-26 DIAGNOSIS — E1169 Type 2 diabetes mellitus with other specified complication: Secondary | ICD-10-CM

## 2020-07-26 NOTE — Telephone Encounter (Signed)
Dr.Beasley 

## 2020-07-31 ENCOUNTER — Ambulatory Visit (INDEPENDENT_AMBULATORY_CARE_PROVIDER_SITE_OTHER): Payer: BC Managed Care – PPO | Admitting: Physician Assistant

## 2020-08-02 ENCOUNTER — Other Ambulatory Visit (INDEPENDENT_AMBULATORY_CARE_PROVIDER_SITE_OTHER): Payer: Self-pay | Admitting: Physician Assistant

## 2020-08-02 DIAGNOSIS — E1169 Type 2 diabetes mellitus with other specified complication: Secondary | ICD-10-CM

## 2020-08-03 NOTE — Telephone Encounter (Signed)
Dr.Beasley 

## 2020-08-08 NOTE — Telephone Encounter (Signed)
Last seen 4/4, next visit 5/9. Okay to refill?

## 2020-08-08 NOTE — Telephone Encounter (Signed)
Ok x 1

## 2020-08-14 ENCOUNTER — Ambulatory Visit (INDEPENDENT_AMBULATORY_CARE_PROVIDER_SITE_OTHER): Payer: BC Managed Care – PPO | Admitting: Family Medicine

## 2020-08-14 ENCOUNTER — Encounter (INDEPENDENT_AMBULATORY_CARE_PROVIDER_SITE_OTHER): Payer: Self-pay | Admitting: Family Medicine

## 2020-08-14 ENCOUNTER — Other Ambulatory Visit: Payer: Self-pay

## 2020-08-14 VITALS — BP 140/75 | HR 94 | Temp 97.9°F | Ht 70.0 in | Wt 292.0 lb

## 2020-08-14 DIAGNOSIS — E559 Vitamin D deficiency, unspecified: Secondary | ICD-10-CM

## 2020-08-14 DIAGNOSIS — I10 Essential (primary) hypertension: Secondary | ICD-10-CM | POA: Diagnosis not present

## 2020-08-14 DIAGNOSIS — Z9189 Other specified personal risk factors, not elsewhere classified: Secondary | ICD-10-CM | POA: Diagnosis not present

## 2020-08-14 DIAGNOSIS — Z6841 Body Mass Index (BMI) 40.0 and over, adult: Secondary | ICD-10-CM

## 2020-08-15 MED ORDER — VITAMIN D (ERGOCALCIFEROL) 1.25 MG (50000 UNIT) PO CAPS: 50000.0000 [IU] | ORAL_CAPSULE | ORAL | 0 refills | Status: DC

## 2020-08-15 NOTE — Progress Notes (Signed)
Chief Complaint:   OBESITY Colin Lee is here to discuss his progress with his obesity treatment plan along with follow-up of his obesity related diagnoses. Colin Lee is on keeping a food journal and adhering to recommended goals of 1500-1700 calories and 100+ grams of protein daily and states he is following his eating plan approximately 80-85% of the time. Colin Lee states he is walking for 15 minutes 3-4 times per week.  Today's visit was #: 5 Starting weight: 319 lbs Starting date: 05/23/2020 Today's weight: 292 lbs Today's date: 08/14/2020 Total lbs lost to date: 27 Total lbs lost since last in-office visit: 7  Interim History: Colin Lee continues to do well with weight loss. He is working on increasing protein, but he is not always meeting his goals. He is doing well overall.  Subjective:   1. Vitamin D deficiency Colin Lee is stable on Vit D, but his level is not yet at goal.  2. Essential hypertension Colin Lee's blood pressure is mildly elevated today. He has had increased pain, which may be contributing.  3. At risk for heart disease Colin Lee is at a higher than average risk for cardiovascular disease due to obesity.   Assessment/Plan:   1. Vitamin D deficiency Low Vitamin D level contributes to fatigue and are associated with obesity, breast, and colon cancer. We will refill prescription Vitamin D for 1 month. Colin Lee will follow-up for routine testing of Vitamin D, at least 2-3 times per year to avoid over-replacement.  - Vitamin D, Ergocalciferol, (DRISDOL) 1.25 MG (50000 UNIT) CAPS capsule; Take 1 capsule (50,000 Units total) by mouth every 7 (seven) days.  Dispense: 4 capsule; Refill: 0  2. Essential hypertension Colin Lee will continue with diet, exercise, and weight loss to improve blood pressure control. We will recheck his blood pressure in 1 month, and will watch for signs of hypotension as he continues his lifestyle modifications.  3. At risk for heart  disease Colin Lee was given approximately 15 minutes of coronary artery disease prevention counseling today. He is 60 y.o. male and has risk factors for heart disease including obesity. We discussed intensive lifestyle modifications today with an emphasis on specific weight loss instructions and strategies.   Repetitive spaced learning was employed today to elicit superior memory formation and behavioral change.  4. Obesity with current BMI 42.0 Colin Lee is currently in the action stage of change. As such, his goal is to continue with weight loss efforts. He has agreed to keeping a food journal and adhering to recommended goals of 1500-1700 calories and 100+ grams of protein daily.   Exercise goals: As is.  Behavioral modification strategies: increasing water intake and better snacking choices.  Colin Lee has agreed to follow-up with our clinic in 4 weeks. He was informed of the importance of frequent follow-up visits to maximize his success with intensive lifestyle modifications for his multiple health conditions.   Objective:   Blood pressure 140/75, pulse 94, temperature 97.9 F (36.6 C), height 5\' 10"  (1.778 m), weight 292 lb (132.5 kg), SpO2 98 %. Body mass index is 41.9 kg/m.  General: Cooperative, alert, well developed, in no acute distress. HEENT: Conjunctivae and lids unremarkable. Cardiovascular: Regular rhythm.  Lungs: Normal work of breathing. Neurologic: No focal deficits.   Lab Results  Component Value Date   CREATININE 0.90 05/23/2020   BUN 17 05/23/2020   NA 141 05/23/2020   K 4.7 05/23/2020   CL 100 05/23/2020   CO2 22 05/23/2020   Lab Results  Component Value Date  ALT 79 (H) 05/23/2020   AST 45 (H) 05/23/2020   ALKPHOS 66 05/23/2020   BILITOT 0.3 05/23/2020   Lab Results  Component Value Date   HGBA1C 6.5 (H) 05/23/2020   HGBA1C 6.1 (H) 10/06/2013   Lab Results  Component Value Date   INSULIN 51.4 (H) 05/23/2020   Lab Results  Component Value  Date   TSH 1.707 05/17/2020   Lab Results  Component Value Date   CHOL 146 05/23/2020   HDL 46 05/23/2020   LDLCALC 80 05/23/2020   TRIG 110 05/23/2020   CHOLHDL 4.1 10/23/2019   Lab Results  Component Value Date   WBC 7.0 05/17/2020   HGB 12.7 (L) 05/17/2020   HCT 38.8 (L) 05/17/2020   MCV 86.4 05/17/2020   PLT 262 05/17/2020   Lab Results  Component Value Date   IRON 38 (L) 06/16/2017   TIBC 312 10/06/2013   FERRITIN 71.0 06/16/2017   Attestation Statements:   Reviewed by clinician on day of visit: allergies, medications, problem list, medical history, surgical history, family history, social history, and previous encounter notes.   I, Trixie Dredge, am acting as transcriptionist for Dennard Nip, MD.  I have reviewed the above documentation for accuracy and completeness, and I agree with the above. -  Dennard Nip, MD

## 2020-08-31 ENCOUNTER — Other Ambulatory Visit (INDEPENDENT_AMBULATORY_CARE_PROVIDER_SITE_OTHER): Payer: Self-pay | Admitting: Family Medicine

## 2020-08-31 DIAGNOSIS — E559 Vitamin D deficiency, unspecified: Secondary | ICD-10-CM

## 2020-09-01 ENCOUNTER — Other Ambulatory Visit (INDEPENDENT_AMBULATORY_CARE_PROVIDER_SITE_OTHER): Payer: Self-pay | Admitting: Family Medicine

## 2020-09-01 DIAGNOSIS — E559 Vitamin D deficiency, unspecified: Secondary | ICD-10-CM

## 2020-09-05 ENCOUNTER — Encounter (INDEPENDENT_AMBULATORY_CARE_PROVIDER_SITE_OTHER): Payer: Self-pay | Admitting: Family Medicine

## 2020-09-05 NOTE — Telephone Encounter (Signed)
Pt last seen by Dr. Beasley.  

## 2020-09-07 ENCOUNTER — Other Ambulatory Visit (INDEPENDENT_AMBULATORY_CARE_PROVIDER_SITE_OTHER): Payer: Self-pay | Admitting: Emergency Medicine

## 2020-09-07 DIAGNOSIS — E559 Vitamin D deficiency, unspecified: Secondary | ICD-10-CM

## 2020-09-07 MED ORDER — VITAMIN D (ERGOCALCIFEROL) 1.25 MG (50000 UNIT) PO CAPS: 50000.0000 [IU] | ORAL_CAPSULE | ORAL | 0 refills | Status: DC

## 2020-09-07 NOTE — Telephone Encounter (Signed)
Ok to rf x 1 again

## 2020-09-13 ENCOUNTER — Encounter (INDEPENDENT_AMBULATORY_CARE_PROVIDER_SITE_OTHER): Payer: Self-pay | Admitting: Family Medicine

## 2020-09-13 ENCOUNTER — Ambulatory Visit (INDEPENDENT_AMBULATORY_CARE_PROVIDER_SITE_OTHER): Payer: BC Managed Care – PPO | Admitting: Family Medicine

## 2020-09-13 ENCOUNTER — Other Ambulatory Visit: Payer: Self-pay

## 2020-09-13 VITALS — BP 145/79 | HR 82 | Temp 98.2°F | Ht 70.0 in | Wt 285.0 lb

## 2020-09-13 DIAGNOSIS — E559 Vitamin D deficiency, unspecified: Secondary | ICD-10-CM | POA: Diagnosis not present

## 2020-09-13 DIAGNOSIS — I10 Essential (primary) hypertension: Secondary | ICD-10-CM

## 2020-09-13 DIAGNOSIS — Z6841 Body Mass Index (BMI) 40.0 and over, adult: Secondary | ICD-10-CM | POA: Diagnosis not present

## 2020-09-13 DIAGNOSIS — Z9189 Other specified personal risk factors, not elsewhere classified: Secondary | ICD-10-CM | POA: Diagnosis not present

## 2020-09-13 MED ORDER — VITAMIN D (ERGOCALCIFEROL) 1.25 MG (50000 UNIT) PO CAPS
50000.0000 [IU] | ORAL_CAPSULE | ORAL | 0 refills | Status: DC
Start: 2020-09-13 — End: 2020-09-27

## 2020-09-21 NOTE — Progress Notes (Signed)
Chief Complaint:   OBESITY Colin Lee is here to discuss his progress with his obesity treatment plan along with follow-up of his obesity related diagnoses. Colin Lee is on keeping a food journal and adhering to recommended goals of 1500-1700 calories and 100+ grams of protein daily and states he is following his eating plan approximately 75-80% of the time. 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  Today's visit was #: 6 Starting weight: 319 lbs Starting date: 05/23/2020 Today's weight: 285 lbs Today's date: 09/13/2020 Total lbs lost to date: 34 Total lbs lost since last in-office visit: 7  Interim History: Colin Lee continues to do well with weight loss even with increasing his prednisone due to rheumatoid arthritis flare-up. He is back to work and he is much more active but he still feels better. He is meeting his protein goals most of the time.  Subjective:   1. Vitamin D deficiency Colin Lee is stable on Vit D, but his level is not yet at goal.  2. Essential hypertension Colin Lee's blood pressure is elevated today, but normally better controlled. He had his prednisone increased recently which may be contributing.  3. At risk for heart disease Colin Lee is at a higher than average risk for cardiovascular disease due to obesity.   Assessment/Plan:   1. Vitamin D deficiency Low Vitamin D level contributes to fatigue and are associated with obesity, breast, and colon cancer. We will refill prescription Vitamin D for 1 month, and we will recheck labs in 1 month. Colin Lee will follow-up for routine testing of Vitamin D, at least 2-3 times per year to avoid over-replacement.  - Vitamin D, Ergocalciferol, (DRISDOL) 1.25 MG (50000 UNIT) CAPS capsule; Take 1 capsule (50,000 Units total) by mouth every 7 (seven) days.  Dispense: 4 capsule; Refill: 0  2. Essential hypertension Colin Lee will continue his medications, diet, and exercise to improve blood pressure control. We will recheck  his blood pressure in 2 weeks.  3. At risk for heart disease Colin Lee was given approximately 15 minutes of coronary artery disease prevention counseling today. He is 60 y.o. male and has risk factors for heart disease including obesity. We discussed intensive lifestyle modifications today with an emphasis on specific weight loss instructions and strategies.   Repetitive spaced learning was employed today to elicit superior memory formation and behavioral change.  4. Obesity with current BMI 40.9 Colin Lee is currently in the action stage of change. As such, his goal is to continue with weight loss efforts. He has agreed to keeping a food journal and adhering to recommended goals of 1500-1700 calories and 100+ grams of protein daily.   Exercise goals: As is.  Behavioral modification strategies: increasing lean protein intake and meal planning and cooking strategies.  Colin Lee has agreed to follow-up with our clinic in 2 weeks. He was informed of the importance of frequent follow-up visits to maximize his success with intensive lifestyle modifications for his multiple health conditions.   Objective:   Blood pressure (!) 145/79, pulse 82, temperature 98.2 F (36.8 C), height 5\' 10"  (1.778 m), weight 285 lb (129.3 kg), SpO2 96 %. Body mass index is 40.89 kg/m.  General: Cooperative, alert, well developed, in no acute distress. HEENT: Conjunctivae and lids unremarkable. Cardiovascular: Regular rhythm.  Lungs: Normal work of breathing. Neurologic: No focal deficits.   Lab Results  Component Value Date   CREATININE 0.90 05/23/2020   BUN 17 05/23/2020   NA 141 05/23/2020   K 4.7 05/23/2020   CL 100 05/23/2020  CO2 22 05/23/2020   Lab Results  Component Value Date   ALT 79 (H) 05/23/2020   AST 45 (H) 05/23/2020   ALKPHOS 66 05/23/2020   BILITOT 0.3 05/23/2020   Lab Results  Component Value Date   HGBA1C 6.5 (H) 05/23/2020   HGBA1C 6.1 (H) 10/06/2013   Lab Results  Component  Value Date   INSULIN 51.4 (H) 05/23/2020   Lab Results  Component Value Date   TSH 1.707 05/17/2020   Lab Results  Component Value Date   CHOL 146 05/23/2020   HDL 46 05/23/2020   LDLCALC 80 05/23/2020   TRIG 110 05/23/2020   CHOLHDL 4.1 10/23/2019   Lab Results  Component Value Date   WBC 7.0 05/17/2020   HGB 12.7 (L) 05/17/2020   HCT 38.8 (L) 05/17/2020   MCV 86.4 05/17/2020   PLT 262 05/17/2020   Lab Results  Component Value Date   IRON 38 (L) 06/16/2017   TIBC 312 10/06/2013   FERRITIN 71.0 06/16/2017   Attestation Statements:   Reviewed by clinician on day of visit: allergies, medications, problem list, medical history, surgical history, family history, social history, and previous encounter notes.   I, Trixie Dredge, am acting as transcriptionist for Dennard Nip, MD.  I have reviewed the above documentation for accuracy and completeness, and I agree with the above. -  Dennard Nip, MD

## 2020-09-27 ENCOUNTER — Ambulatory Visit (INDEPENDENT_AMBULATORY_CARE_PROVIDER_SITE_OTHER): Payer: BC Managed Care – PPO | Admitting: Family Medicine

## 2020-09-27 ENCOUNTER — Other Ambulatory Visit: Payer: Self-pay

## 2020-09-27 ENCOUNTER — Encounter (INDEPENDENT_AMBULATORY_CARE_PROVIDER_SITE_OTHER): Payer: Self-pay | Admitting: Family Medicine

## 2020-09-27 VITALS — BP 133/80 | HR 86 | Temp 98.5°F | Ht 70.0 in | Wt 286.0 lb

## 2020-09-27 DIAGNOSIS — Z6841 Body Mass Index (BMI) 40.0 and over, adult: Secondary | ICD-10-CM

## 2020-09-27 DIAGNOSIS — K760 Fatty (change of) liver, not elsewhere classified: Secondary | ICD-10-CM | POA: Diagnosis not present

## 2020-09-27 DIAGNOSIS — E559 Vitamin D deficiency, unspecified: Secondary | ICD-10-CM

## 2020-09-27 DIAGNOSIS — E1169 Type 2 diabetes mellitus with other specified complication: Secondary | ICD-10-CM | POA: Diagnosis not present

## 2020-09-27 MED ORDER — VITAMIN D (ERGOCALCIFEROL) 1.25 MG (50000 UNIT) PO CAPS: 50000.0000 [IU] | ORAL_CAPSULE | ORAL | 0 refills | Status: AC

## 2020-10-03 NOTE — Progress Notes (Signed)
Chief Complaint:   OBESITY Daivd is here to discuss his progress with his obesity treatment plan along with follow-up of his obesity related diagnoses. Ziere is on keeping a food journal and adhering to recommended goals of 1500-1700 calories and 100+ grams of protein daily and states he is following his eating plan approximately 85% of the time. Kaitlin states he is walking 7,000 steps a day.   Today's visit was #: 7 Starting weight: 319 lbs Starting date: 05/23/2020 Today's weight: 286 lbs Today's date: 09/27/2020 Total lbs lost to date: 33 Total lbs lost since last in-office visit: 0  Interim History: Johnmichael is now on prednisone for musculoskeletal pain. He has retained some water weight, but not fat gain. He is very active at work and he is walking 7,000-9,000 steps per day.  Subjective:   1. Vitamin D deficiency Sanjay is on Vit D, and he denies nausea or vomiting. He requests a refill today.  2. Type 2 diabetes mellitus with other specified complication, without long-term current use of insulin (HCC) Claudio's recent A1c was at 6.5. he states he discussed metformin with his primary care physician who told him he didn't need metformin and to stop it. He has stopped metformin  3. NAFLD (nonalcoholic fatty liver disease) Eston's last AST and ALT were mildly elevated, which is commonly seen in obesity and diabetes mellitus II. He is working on diet and weight loss, and he has done well. He is no longer on metformin.  Assessment/Plan:   1. Vitamin D deficiency Low Vitamin D level contributes to fatigue and are associated with obesity, breast, and colon cancer. We will refill prescription Vitamin D for 1 month. Trevelle will follow-up for routine testing of Vitamin D, at least 2-3 times per year to avoid over-replacement.  - Vitamin D, Ergocalciferol, (DRISDOL) 1.25 MG (50000 UNIT) CAPS capsule; Take 1 capsule (50,000 Units total) by mouth every 7 (seven) days.   Dispense: 4 capsule; Refill: 0  2. Type 2 diabetes mellitus with other specified complication, without long-term current use of insulin (HCC) Howard is to hold metformin (per his primary care physician). He will continue his eating plan and weight loss efforts. Good blood sugar control is important to decrease the likelihood of diabetic complications such as nephropathy, neuropathy, limb loss, blindness, coronary artery disease, and death. Intensive lifestyle modification including diet, exercise and weight loss are the first line of treatment for diabetes.   3. NAFLD (nonalcoholic fatty liver disease) We discussed the likely diagnosis of non-alcoholic fatty liver disease today and how this condition is obesity related. Isreal was educated the importance of weight loss. Fawzi will continue diet and exercise as an essential part of his treatment plan. We will plan to recheck labs in 1 month.  4. Obesity with current BMI 41.1 Jaret is currently in the action stage of change. As such, his goal is to continue with weight loss efforts. He has agreed to keeping a food journal and adhering to recommended goals of 1500-1700 calories and 100 grams of protein daily.   Exercise goals: Continue to monitor steps, goals is 9,000 steps daily.  Behavioral modification strategies: increasing lean protein intake and meal planning and cooking strategies.  Adante has agreed to follow-up with our clinic in 3 to 4 weeks. He was informed of the importance of frequent follow-up visits to maximize his success with intensive lifestyle modifications for his multiple health conditions.   Objective:   Blood pressure 133/80, pulse 86, temperature 98.5  F (36.9 C), height 5\' 10"  (1.778 m), weight 286 lb (129.7 kg), SpO2 95 %. Body mass index is 41.04 kg/m.  General: Cooperative, alert, well developed, in no acute distress. HEENT: Conjunctivae and lids unremarkable. Cardiovascular: Regular rhythm.  Lungs:  Normal work of breathing. Neurologic: No focal deficits.   Lab Results  Component Value Date   CREATININE 0.90 05/23/2020   BUN 17 05/23/2020   NA 141 05/23/2020   K 4.7 05/23/2020   CL 100 05/23/2020   CO2 22 05/23/2020   Lab Results  Component Value Date   ALT 79 (H) 05/23/2020   AST 45 (H) 05/23/2020   ALKPHOS 66 05/23/2020   BILITOT 0.3 05/23/2020   Lab Results  Component Value Date   HGBA1C 6.5 (H) 05/23/2020   HGBA1C 6.1 (H) 10/06/2013   Lab Results  Component Value Date   INSULIN 51.4 (H) 05/23/2020   Lab Results  Component Value Date   TSH 1.707 05/17/2020   Lab Results  Component Value Date   CHOL 146 05/23/2020   HDL 46 05/23/2020   LDLCALC 80 05/23/2020   TRIG 110 05/23/2020   CHOLHDL 4.1 10/23/2019   Lab Results  Component Value Date   WBC 7.0 05/17/2020   HGB 12.7 (L) 05/17/2020   HCT 38.8 (L) 05/17/2020   MCV 86.4 05/17/2020   PLT 262 05/17/2020   Lab Results  Component Value Date   IRON 38 (L) 06/16/2017   TIBC 312 10/06/2013   FERRITIN 71.0 06/16/2017   Attestation Statements:   Reviewed by clinician on day of visit: allergies, medications, problem list, medical history, surgical history, family history, social history, and previous encounter notes.  Time spent on visit including pre-visit chart review and post-visit care and charting was 40 minutes.    I, Trixie Dredge, am acting as transcriptionist for Dennard Nip, MD.  I have reviewed the above documentation for accuracy and completeness, and I agree with the above. -  Dennard Nip, MD

## 2020-10-24 ENCOUNTER — Ambulatory Visit (INDEPENDENT_AMBULATORY_CARE_PROVIDER_SITE_OTHER): Payer: BC Managed Care – PPO | Admitting: Family Medicine

## 2020-10-26 ENCOUNTER — Telehealth: Payer: Self-pay | Admitting: *Deleted

## 2020-10-26 NOTE — Telephone Encounter (Signed)
   [facility] Pre-operative Risk Assessment    Patient Name: Colin Lee  DOB: Feb 13, 1961 MRN: [id number]  HEARTCARE STAFF:  - IMPORTANT!!!!!! Under Visit Info/Reason for Call, type in Other and utilize the format Clearance MM/DD/YY or Clearance TBD. Do not use dashes or single digits. - Please review there is not already an duplicate clearance open for this procedure. - If request is for dental extraction, please clarify the # of teeth to be extracted. - If the patient is currently at the dentist's office, call Pre-Op Callback Staff (MA/nurse) to input urgent request.  - If the patient is not currently in the dentist office, please route to the Pre-Op pool.  Request for surgical clearance:  What type of surgery is being performed? 6 TEETH BEING EXTRACTED WITH REMOVAL OF TORI  When is this surgery scheduled? TBD  What type of clearance is required (medical clearance vs. Pharmacy clearance to hold med vs. Both)? MEDICAL  Are there any medications that need to be held prior to surgery and how long? ASA AND PLAVIX  Practice name and name of physician performing surgery? [facility]; DR. Jethro Bolus, DMD  What is the office phone number? [phone]   7.   What is the office fax number? [phone]  8.   Anesthesia type (None, local, MAC, general) ? PLAN TO USE ONE OF THE FOLLOWING TYPES OF ANESTHESIA: 4% ARTICAINE 1:100,000 W/EPI; 2% LIDOCAINE 1:100,000 W/EPI; 0.50% MARCAINE HCL 1:200,000 W/EPI; 2% CARBOCAINE (W/O VASOCONSTRICTOR); 2% CARBOCAINE W/1:20,000 NEW COBEFRIN   Julaine Hua 10/26/2020, 12:22 PM  _________________________________________________________________   (provider comments below)

## 2020-10-26 NOTE — Telephone Encounter (Signed)
Our office received a clearance request for dental procedure. In the clearance it does state dental extractions. I left a message that we will need to know to how many teeth are planned to be extracted, as this can make a difference as to how long his blood thinners are held for. Left message to either call back and let the operator how many teeth to be extracted or may fax over a new clearance request with the needed information. I did state if they have been placed into a vm for me, to not leave a vm as I do not have access the vm on the phone at my desk.

## 2020-10-30 NOTE — Progress Notes (Deleted)
Cardiology Office Note:    Date:  10/30/2020   ID:  Colin Lee, DOB [birth year], MRN [id number]  PCP:  [name], MD   [facility] Providers Cardiologist:  Fransico Him, MD {   Referring MD: [name], MD    History of Present Illness:    Colin Lee is a 60 y.o. male with a hx of CAD, prior DES X 2 to RCA, hx of pleural effusions s/p talc pleurodesis, RA, and COPD who is regularly followed by Dr. Radford Pax who now presents as an urgent visit for pre-operative evaluation prior to tooth extraction.  Of note, patient was hospitalized in 05/2020 for progressive chest pain and dyspnea on exertion. LHC 05/16/20 with patent prior stents and no obstructive disease. RHC revealed mild pulmonary HTN. Was recommended for continued medical therapy and management of his underlying ILD.   Past Medical History:  Diagnosis Date   Acute febrile illness 10/20/2019   Allergy    Arthralgia 06/03/2014   Arthritis    Asthma    Bacteremia 10/21/2019   CAD (coronary artery disease) 06/20/2014   Cellulitis and abscess of right leg with Bacteremia 10/21/2019   Chest pain 05/31/2014   Chewing difficulty    Cholelithiases 10/26/2013   Claudication ([place]) 04/15/2016   Constipation    COPD (chronic obstructive pulmonary disease) ([place]) 10/21/2019   Coronary artery disease 2016   90% RCA and 30% LM S/P PCI of RCA   Depression    DOE (dyspnea on exertion)    Quit smoking 2002 - Spirometry 03/29/2019  FEV1 2.25 (55%)  Ratio 0.74 with no resp to saba p ? Prior    Onset June 2020 with cards w/u neg 02/2019 and assoc with atypical cp on prn ppi - 03/29/2019   Walked RA x two laps =  approx 542f @ fast pace - stopped due to end of study with sats of 92% at the end of the study and mild sob  - 03/29/2019 max rx for GERD      Elevated troponin    Fatty liver    GERD (gastroesophageal reflux disease)    Headache    stress and tension   History of nuclear stress test    Myoview 1/19: EF 61, no  ischemia, low risk   Hyperlipidemia    Hypertension    Joint pain    Melanoma ([place] 11/24/2019   Right upper back. Superficial spreading. 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   Tubular adenoma of colon 03/2013    Past Surgical History:  Procedure Laterality Date   CHEST TUBE INSERTION Left 02/01/2014   Procedure: INSERTION PLEURAL DRAINAGE CATHETER;  Surgeon: EdGrace IsaacMD;  [place] [facility] Service: Thoracic;  Laterality: Left;   COLONOSCOPY     COLONOSCOPY W/ BIOPSIES AND POLYPECTOMY  2014   benign   LEFT HEART CATHETERIZATION WITH CORONARY ANGIOGRAM N/A 06/07/2014   Procedure:  LEFT HEART CATHETERIZATION WITH CORONARY ANGIOGRAM;  Surgeon: Peter M Martinique, MD;  Location[facility] CATH LAB;  Service: Cardiovascular;  Laterality: N/A;   LEFT HEART CATHETERIZATION WITH CORONARY ANGIOGRAM N/A 06/08/2014   Procedure: LEFT HEART CATHETERIZATION WITH CORONARY ANGIOGRAM;  Surgeon: Sinclair Grooms, MD;  Location[facility] CATH LAB;  Service: Cardiovascular;  Laterality: N/A;   MASS EXCISION N/A 10/25/2019   Procedure: SKIN BIOPSY;  Surgeon: Aviva Signs, MD;  Location: [facility] ORS;  Service: General;  Laterality: N/A;   no prior surgery     PLEURAL BIOPSY Left 02/01/2014   Procedure: PLEURAL BIOPSY;  Surgeon: Grace Isaac, MD;  Location: [place];  Service: Thoracic;  Laterality: Left;   PLEURAL EFFUSION DRAINAGE Left 02/01/2014   Procedure: DRAINAGE OF PLEURAL EFFUSION;  Surgeon: Grace Isaac, MD;   Location: [name];  Service: Thoracic;  Laterality: Left;   REMOVAL OF PLEURAL DRAINAGE CATHETER Left 02/16/2014   Procedure: REMOVAL OF PLEURAL DRAINAGE CATHETER;  Surgeon: Grace Isaac, MD;  Location: [place];  Service: Thoracic;  Laterality: Left;   RIGHT/LEFT HEART CATH AND CORONARY ANGIOGRAPHY N/A 05/16/2020   Procedure: RIGHT/LEFT HEART CATH AND CORONARY ANGIOGRAPHY;  Surgeon: Troy Sine, MD;  Location: [place] CV LAB;  Service: Cardiovascular;  Laterality: N/A;   TALC PLEURODESIS Left 02/01/2014   Procedure: Pietro Cassis;  Surgeon: Grace Isaac, MD;  Location: [place];  Service: Thoracic;  Laterality: Left;   THORACENTESIS Left 2015   VIDEO ASSISTED THORACOSCOPY Left 02/01/2014   Procedure: VIDEO ASSISTED THORACOSCOPY;  Surgeon: Grace Isaac, MD;  Location: [place];  Service: Thoracic;  Laterality: Left;   VIDEO BRONCHOSCOPY N/A 02/01/2014   Procedure: VIDEO BRONCHOSCOPY;  Surgeon: Grace Isaac, MD;  Location[facility] OR;  Service: Thoracic;  Laterality: N/A;    Current Medications: No outpatient medications have been marked as taking for the 10/31/20 encounter (Appointment) with Freada Bergeron, MD.     Allergies:   Hydroxychloroquine   Social History   Socioeconomic History   Marital status: Married    Spouse name: Sherri   Number of children: Not on file   Years of education: Not on file   Highest education level: Not on file  Occupational History   Occupation: Facility Maintenance  Tobacco Use   Smoking status: Former    Packs/day: 1.00    Years: 30.00    Pack years: 30.00    Types: Cigarettes    Quit date: 04/08/2000    Years since quitting: 20.5   Smokeless tobacco: Never  Vaping Use   Vaping Use: Never used  Substance and Sexual Activity   Alcohol use: Yes   Drug use: No   Sexual activity: Yes    Partners: Female  Other Topics Concern   Not on file  Social History Narrative   Not on file   Social Determinants of Health   Financial  Resource Strain: Not on file  Food Insecurity: Not on file  Transportation Needs: Not on file  Physical Activity: Not on file  Stress: Not on file  Social Connections: Not on file     Family History: The patient's ***family history is negative for Colon cancer, Esophageal cancer, Rectal cancer, and Stomach cancer. He was adopted.  ROS:   Please see the history of present illness.    *** All other systems reviewed and are negative.  EKGs/Labs/Other Studies Reviewed:    The following studies were reviewed today: Right and left heart cath 05/16/20:  Previously placed Ost RCA to Prox RCA stent (unknown type) is widely patent.   Mild right heart pressure elevation with mild pulmonary hypertension with PA mean 5 mm.   No significant CAD with widely patent stent in the proximal RCA; normal LAD and left circumflex vessels.   RECOMMENDATION: Medical therapy for diastolic dysfunction with optimal blood pressure control and weight loss.  With mild pulmonary hypertension, consider evaluation for obstructive sleep apnea.       Echo 05/15/20: 1. Left ventricular ejection fraction, by estimation, is 60 to 65%. The  left ventricle has normal function. The left ventricle has no regional  wall motion abnormalities. There is mild left ventricular hypertrophy.  Left ventricular diastolic parameters  are consistent with Grade I diastolic dysfunction (impaired relaxation).   2. Right ventricular systolic function is normal. The right ventricular  size is normal.   3. Left atrial size was mild to moderately dilated.   4. The mitral valve is normal in structure. No evidence of mitral valve  regurgitation. No evidence of mitral stenosis.   5. The aortic valve is tricuspid. There is mild calcification of the  aortic valve. There is mild thickening of the aortic valve. Aortic valve  regurgitation is not visualized. No aortic stenosis is present.  EKG:  EKG is *** ordered today.  The ekg ordered today  demonstrates ***  Recent Labs: 05/15/2020: B Natriuretic Peptide 87.0 05/17/2020: Hemoglobin 12.7; Platelets 262; TSH 1.707 05/23/2020: ALT 79; BUN 17; Creatinine, Ser 0.90; Potassium 4.7; Sodium 141  Recent Lipid Panel    Component Value Date/Time   CHOL 146 05/23/2020 0945   TRIG 110 05/23/2020 0945   HDL 46 05/23/2020 0945   CHOLHDL 4.1 10/23/2019 0632   VLDL 19 10/23/2019 0632   LDLCALC 80 05/23/2020 0945   LDLCALC 46 09/05/2017 0824     Risk Assessment/Calculations:   {Does this patient have ATRIAL FIBRILLATION?:[phone]}       Physical Exam:    VS:  There were no vitals taken for this visit.    Wt Readings from Last 3 Encounters:  09/27/20 286 lb (129.7 kg)  09/13/20 285 lb (129.3 kg)  08/14/20 292 lb (132.5 kg)     GEN: *** Well nourished, well developed in no acute distress HEENT: Normal NECK: No JVD; No carotid bruits LYMPHATICS: No lymphadenopathy CARDIAC: ***RRR, no murmurs, rubs, gallops RESPIRATORY:  Clear to auscultation without rales, wheezing or rhonchi  ABDOMEN: Soft, non-tender, non-distended MUSCULOSKELETAL:  No edema; No deformity  SKIN: Warm and dry NEUROLOGIC:  Alert and oriented x 3 PSYCHIATRIC:  Normal affect   ASSESSMENT:    No diagnosis found. PLAN:    In order of problems listed above:  #Pre-operative Evaluation:  #CAD s/p RCA stent x2: Right and left heart cath with patent stents and no obstructive disease in 05/2020.  -Continue plavix '75mg'$  daily; okay to hold for procedure -Continue ASA 61mgdaily -Change metop to succinate '25mg'$  XL daily -Continue losartan '50mg'$  daily -Continue lipitor '40mg'$  daily  #ILD: Management per pulm. -Continue MTX per pulm  #HTN: -Change metop to succinate '25mg'$  XL daily -Continue losartan '50mg'$  daily  #HLD: -Continue lipitor '40mg'$  daily  {Are you ordering a CV Procedure (e.g. stress test, cath, DCCV, TEE, etc)?   Press F2        :[id number]   Medication Adjustments/Labs and Tests  Ordered: Current medicines are reviewed at length with the patient today.  Concerns regarding medicines are outlined above.  No orders of the defined  types were placed in this encounter.  No orders of the defined types were placed in this encounter.   There are no Patient Instructions on file for this visit.   Signed, Freada Bergeron, MD  10/30/2020 2:05 PM    [place]

## 2020-10-30 NOTE — Telephone Encounter (Signed)
I s/w the pt and he is agreeable to pre op appt needed for clearance. Ok per [place] to use time slot with Dr. Johney Frame. Pt's primary card is Dr. Radford Pax. Procedure is set for 11/10/20 per pt . I will send notes to MD for upcoming appt. Will send FYI to requesting office pt has appt 10/31/20.

## 2020-10-30 NOTE — Telephone Encounter (Signed)
Primary Cardiologist:Traci Turner, MD  Chart reviewed as part of pre-operative protocol coverage. Because of Colin Lee's past medical history and time since last visit, he/she will require a follow-up visit in order to better assess preoperative cardiovascular risk.  Pre-op covering staff: - Please schedule appointment and call patient to inform them. - Please contact requesting surgeon's office via preferred method (i.e, phone, fax) to inform them of need for appointment prior to surgery.  If applicable, this message will also be routed to pharmacy pool and/or primary cardiologist for input on holding anticoagulant/antiplatelet agent as requested below so that this information is available at time of patient's appointment.   Colin Pelton, NP  10/30/2020, 10:58 AM

## 2020-10-31 ENCOUNTER — Ambulatory Visit: Payer: BC Managed Care – PPO | Admitting: Cardiology

## 2020-10-31 ENCOUNTER — Other Ambulatory Visit: Payer: Self-pay

## 2020-10-31 ENCOUNTER — Encounter: Payer: Self-pay | Admitting: Cardiology

## 2020-10-31 VITALS — BP 134/70 | HR 85 | Ht 70.0 in | Wt 295.0 lb

## 2020-10-31 DIAGNOSIS — J849 Interstitial pulmonary disease, unspecified: Secondary | ICD-10-CM

## 2020-10-31 DIAGNOSIS — I10 Essential (primary) hypertension: Secondary | ICD-10-CM

## 2020-10-31 DIAGNOSIS — I2510 Atherosclerotic heart disease of native coronary artery without angina pectoris: Secondary | ICD-10-CM

## 2020-10-31 DIAGNOSIS — E785 Hyperlipidemia, unspecified: Secondary | ICD-10-CM | POA: Diagnosis not present

## 2020-10-31 DIAGNOSIS — Z01810 Encounter for preprocedural cardiovascular examination: Secondary | ICD-10-CM | POA: Diagnosis not present

## 2020-10-31 NOTE — Progress Notes (Addendum)
Cardiology Office Note:    Date:  10/31/2020   ID:  Colin Lee, DOB Jan 09, 1961, MRN [id number]  PCP:  [name], MD   [facility] Providers Cardiologist:  Fransico Him, MD {   Referring MD: [name], MD    History of Present Illness:    Colin Lee is a 60 y.o. male with a hx of CAD, prior DES X 2 to RCA, hx of pleural effusions s/p talc pleurodesis, RA, and COPD who is regularly followed by Dr. Radford Pax who now presents as an urgent visit for pre-operative evaluation prior to tooth extraction.  Of note, patient was hospitalized in 05/2020 for progressive chest pain and dyspnea on exertion. LHC 05/16/20 with patent prior stents and no obstructive disease. RHC revealed mild pulmonary HTN. Was recommended for continued medical therapy and management of his underlying ILD.   Today, he overall feels well. His shortness of breath is significantly improved from 05/2020 and he has lost a significant amount of weight. He has been participating in the weight loss clinic, and has successfully lost 30 pounds. He is able to walk a flight of stairs without feeling short-winded. 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 Occasionally he has mild LE edema by the end of the day. He denies any chest pain, palpitations, headaches, lightheadedness, or syncope. Also has no orthopnea or PND.  Has been following with Pulm for his ILD.   Of note, the patient is hoping to come off either ASA or plavix due to frequent bruising.   Past Medical History:  Diagnosis Date   Acute febrile illness 10/20/2019   Allergy    Arthralgia 06/03/2014   Arthritis    Asthma    Bacteremia 10/21/2019   CAD (coronary artery disease) 06/20/2014   Cellulitis and abscess of right leg with Bacteremia 10/21/2019   Chest pain 05/31/2014   Chewing difficulty    Cholelithiases 10/26/2013   Claudication ([place]) 04/15/2016   Constipation    COPD (chronic  obstructive pulmonary disease) ([place]) 10/21/2019   Coronary artery disease 2016   90% RCA and 30% LM S/P PCI of RCA   Depression    DOE (dyspnea on exertion)    Quit smoking 2002 - Spirometry 03/29/2019  FEV1 2.25 (55%)  Ratio 0.74 with no resp to saba p ? Prior    Onset June 2020 with cards w/u neg 02/2019 and assoc with atypical cp on prn ppi - 03/29/2019   Walked RA x two laps =  approx 54f @ fast pace - stopped due to end of study with sats of 92% at the end of the study and mild sob  - 03/29/2019 max rx for GERD      Elevated troponin    Fatty liver    GERD (gastroesophageal reflux disease)    Headache    stress and tension   History of nuclear stress test    Myoview 1/19: EF 61, no ischemia, low risk   Hyperlipidemia    Hypertension    Joint pain    Melanoma [place] 11/24/2019   Right upper back. Superficial spreading. 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   Tubular adenoma of colon 03/2013    Past Surgical History:  Procedure Laterality Date   CHEST TUBE INSERTION Left 02/01/2014   Procedure: INSERTION PLEURAL DRAINAGE CATHETER;  Surgeon: Grace Isaac, MD;  Location: [facility];  Service: Thoracic;  Laterality: Left;   COLONOSCOPY     COLONOSCOPY W/ BIOPSIES AND POLYPECTOMY  2014   benign   LEFT HEART CATHETERIZATION WITH CORONARY ANGIOGRAM N/A 06/07/2014   Procedure: LEFT HEART CATHETERIZATION WITH CORONARY ANGIOGRAM;  Surgeon: Peter M Martinique, MD;  Location: [facility] CATH LAB;  Service:  Cardiovascular;  Laterality: N/A;   LEFT HEART CATHETERIZATION WITH CORONARY ANGIOGRAM N/A 06/08/2014   Procedure: LEFT HEART CATHETERIZATION WITH CORONARY ANGIOGRAM;  Surgeon: Sinclair Grooms, MD;  Location: [facility] CATH LAB;  Service: Cardiovascular;  Laterality: N/A;   MASS EXCISION N/A 10/25/2019   Procedure: SKIN BIOPSY;  Surgeon: Aviva Signs, MD;  Location: [facility];  Service: General;  Laterality: N/A;   no prior surgery     PLEURAL BIOPSY Left 02/01/2014   Procedure: PLEURAL BIOPSY;  Surgeon: Grace Isaac, MD;  Location: [place];  Service: Thoracic;  Laterality: Left;   PLEURAL EFFUSION DRAINAGE Left 02/01/2014   Procedure: DRAINAGE OF PLEURAL EFFUSION;  Surgeon: Grace Isaac, MD;  Location: [place];  Service: Thoracic;  Laterality: Left;   REMOVAL OF PLEURAL DRAINAGE CATHETER Left 02/16/2014   Procedure: REMOVAL OF PLEURAL DRAINAGE CATHETER;  Surgeon: Grace Isaac, MD;  Location: [facility];  Service: Thoracic;  Laterality: Left;   RIGHT/LEFT HEART CATH AND CORONARY ANGIOGRAPHY N/A 05/16/2020   Procedure: RIGHT/LEFT HEART CATH AND CORONARY ANGIOGRAPHY;  Surgeon: Troy Sine, MD;  Location: [facility] CV LAB;  Service: Cardiovascular;  Laterality: N/A;   TALC PLEURODESIS Left 02/01/2014   Procedure: Pietro Cassis;  Surgeon: Grace Isaac, MD;  Location: [place];  Service: Thoracic;  Laterality: Left;   THORACENTESIS Left 2015   VIDEO ASSISTED THORACOSCOPY Left 02/01/2014   Procedure: VIDEO ASSISTED THORACOSCOPY;  Surgeon: Grace Isaac, MD;  Location: [facility];  Service: Thoracic;  Laterality: Left;   VIDEO BRONCHOSCOPY N/A 02/01/2014   Procedure: VIDEO BRONCHOSCOPY;  Surgeon: Grace Isaac, MD;  Location: [facility];  Service: Thoracic;  Laterality: N/A;    Current Medications: Current Meds  Medication Sig   acetaminophen (TYLENOL) 500 MG tablet Take 1,000 mg by mouth every 6 (six) hours as needed for moderate pain.   albuterol (VENTOLIN HFA) 108 (90 Base) MCG/ACT inhaler  Inhale 1 puff into the lungs every 6 (six) hours as needed for wheezing or shortness of breath.   aspirin EC 81 MG tablet Take 1 tablet (81 mg total) by mouth daily with breakfast.   atorvastatin (LIPITOR) 40 MG tablet One half daily   clopidogrel (PLAVIX) 75 MG tablet TAKE 1 TABLET BY MOUTH ONCE DAILY.   famotidine (PEPCID) 20 MG tablet One after supper   Fluticasone-Umeclidin-Vilant (TRELEGY ELLIPTA) 100-62.5-25 MCG/INH AEPB Inhale 100 fluid ounces into the lungs daily at 12 noon.   losartan (COZAAR) 50 MG tablet Take 1 tablet (50 mg total) by mouth daily.   metoprolol tartrate (LOPRESSOR) 25 MG tablet Take 0.5 tablets (12.5 mg total) by mouth 2 (two) times  daily.   Multiple Vitamins-Minerals (CENTRUM ADULTS PO) Take 1 tablet by mouth daily.   mycophenolate (CELLCEPT) 500 MG tablet Take 500 mg by mouth 2 (two) times daily.   nitroGLYCERIN (NITROSTAT) 0.4 MG SL tablet Place 1 tablet (0.4 mg total) under the tongue every 5 (five) minutesas needed for chest pain.   Omega-3 Fatty Acids (FISH OIL) 1000 MG CPDR Take 4,000 mg by mouth daily.   OXYGEN Inhale 3 L into the lungs as needed. O2 at 3L as needed for shortness of breath.   pantoprazole (PROTONIX) 40 MG tablet TAKE 1 TABLET BY MOUTH ONCE DAILY.   predniSONE (DELTASONE) 5 MG tablet Take 15 mg by mouth daily with breakfast. Patient stated he takes two of the 5 mg tablets in the morning   testosterone cypionate (DEPOTESTOSTERONE CYPIONATE) 200 MG/ML injection Inject 200 mg into the muscle every 30 (thirty) days.   Vitamin D, Ergocalciferol, (DRISDOL) 1.25 MG (50000 UNIT) CAPS capsule Take 1 capsule (50,000 Units total) by mouth every 7 (seven) days.   Vitamin D, Ergocalciferol, (DRISDOL) 1.25 MG (50000 UNIT) CAPS capsule Take 1 capsule (50,000 Units total) by mouth every 7 (seven) days.     Allergies:   Hydroxychloroquine   Social History   Socioeconomic History   Marital status: Married    Spouse name: Sherri   Number of children: Not on  file   Years of education: Not on file   Highest education level: Not on file  Occupational History   Occupation: Facility Maintenance  Tobacco Use   Smoking status: Former    Packs/day: 1.00    Years: 30.00    Pack years: 30.00    Types: Cigarettes    Quit date: 04/08/2000    Years since quitting: 20.5   Smokeless tobacco: Never  Vaping Use   Vaping Use: Never used  Substance and Sexual Activity   Alcohol use: Yes   Drug use: No   Sexual activity: Yes    Partners: Female  Other Topics Concern   Not on file  Social History Narrative   Not on file   Social Determinants of Health   Financial Resource Strain: Not on file  Food Insecurity: Not on file  Transportation Needs: Not on file  Physical Activity: Not on file  Stress: Not on file  Social Connections: Not on file     Family History: The patient's family history is negative for Colon cancer, Esophageal cancer, Rectal cancer, and Stomach cancer. He was adopted.  ROS:   Review of Systems  Constitutional:  Negative for fever and malaise/fatigue.  HENT:  Negative for hearing loss and nosebleeds.   Eyes:  Negative for double vision and discharge.  Respiratory:  Positive for shortness of breath. Negative for cough.   Cardiovascular:  Positive for leg swelling. Negative for chest pain, palpitations, orthopnea, claudication and PND.  Gastrointestinal:  Negative for constipation, diarrhea and vomiting.  Genitourinary:  Negative for hematuria and urgency.  Musculoskeletal:  Positive for joint pain. Negative for back pain and falls.  Neurological:  Negative for dizziness and tremors.  Endo/Heme/Allergies:  Negative for polydipsia. Bruises/bleeds easily.  Psychiatric/Behavioral:  Negative for hallucinations and suicidal ideas.     EKGs/Labs/Other Studies Reviewed:    The following studies were reviewed today:  Right and left heart cath 05/16/20: Previously placed Ost RCA to Prox RCA stent (unknown type) is widely  patent.   Mild right heart pressure elevation with mild pulmonary hypertension with PA mean 5 mm.   No  significant CAD with widely patent stent in the proximal RCA; normal LAD and left circumflex vessels.   RECOMMENDATION: Medical therapy for diastolic dysfunction with optimal blood pressure control and weight loss.  With mild pulmonary hypertension, consider evaluation for obstructive sleep apnea.       Echo 05/15/20: 1. Left ventricular ejection fraction, by estimation, is 60 to 65%. The  left ventricle has normal function. The left ventricle has no regional  wall motion abnormalities. There is mild left ventricular hypertrophy.  Left ventricular diastolic parameters  are consistent with Grade I diastolic dysfunction (impaired relaxation).   2. Right ventricular systolic function is normal. The right ventricular  size is normal.   3. Left atrial size was mild to moderately dilated.   4. The mitral valve is normal in structure. No evidence of mitral valve  regurgitation. No evidence of mitral stenosis.   5. The aortic valve is tricuspid. There is mild calcification of the  aortic valve. There is mild thickening of the aortic valve. Aortic valve  regurgitation is not visualized. No aortic stenosis is present.  EKG:   10/31/2020: EKG is not ordered today.  Recent Labs: 05/15/2020: B Natriuretic Peptide 87.0 05/17/2020: Hemoglobin 12.7; Platelets 262; TSH 1.707 05/23/2020: ALT 79; BUN 17; Creatinine, Ser 0.90; Potassium 4.7; Sodium 141  Recent Lipid Panel    Component Value Date/Time   CHOL 146 05/23/2020 0945   TRIG 110 05/23/2020 0945   HDL 46 05/23/2020 0945   CHOLHDL 4.1 10/23/2019 0632   VLDL 19 10/23/2019 0632   LDLCALC 80 05/23/2020 0945   LDLCALC 46 09/05/2017 0824     Risk Assessment/Calculations:           Physical Exam:    VS:  BP 134/70   Pulse 85   Ht '5\' 10"'$  (1.778 m)   Wt 295 lb (133.8 kg)   SpO2 95%   BMI 42.33 kg/m     Wt Readings from Last 3  Encounters:  10/31/20 295 lb (133.8 kg)  09/27/20 286 lb (129.7 kg)  09/13/20 285 lb (129.3 kg)     GEN: Well nourished, well developed in no acute distress HEENT: Normal NECK: No JVD; No carotid bruits CARDIAC: RRR, no murmurs, rubs, gallops RESPIRATORY:  Faint crackles at right lung base. Otherwise clear ABDOMEN: Soft, non-tender, non-distended MUSCULOSKELETAL:  No edema; No deformity  SKIN: Warm and dry NEUROLOGIC:  Alert and oriented x 3 PSYCHIATRIC:  Normal affect   ASSESSMENT:    1. Preoperative cardiovascular examination   2. Coronary artery disease involving native coronary artery of native heart without angina pectoris   3. Dyslipidemia   4. Essential hypertension   5. Primary hypertension   6. ILD (interstitial lung disease) ([place])   7. Morbid obesity ([place])    PLAN:    In order of problems listed above:  #Pre-operative Evaluation prior to Teeth Extraction: Patient is doing well with no anginal symptoms. Recent cath 05/2020 with no obstructive disease and patent RCA stents. Currently able to achieve >4METs as well. No further cardiac work-up needed prior to teeth extractions. Okay to hold plavix for his procedure. -No further cardiac work-up needed prior to procedure -Okay to hold ASA and plavix if needed prior to tooth extraction for 5-7 days if needed  #CAD s/p RCA stent x2: Right and left heart cath with patent stents and no obstructive disease in 05/2020. Doing well without anginal symptoms. -Continue plavix '75mg'$  daily; okay to hold for 5-7 days for procedure -Continue ASA '81mg'$   daily; if needed, okay to hold for 5-7 days for procedure -Continue metop 12.'5mg'$  BID -Continue losartan '50mg'$  daily -Continue lipitor '40mg'$  daily  #ILD: Management per pulm. -Continue MTX per pulm  #HTN: Well controlled.  -Continue metop 12.'5mg'$  BID -Continue losartan '50mg'$  daily  #HLD: -Continue lipitor '40mg'$  daily  #Morbid Obesity: BMI 42. Patient is very motivated to lose  weight and is currently enrolled in weight loss clinic.  -Will look into ozempic/wegovy for him -Continue follow-up in weight loss clinic  #Easy Bruising: -Will discuss with Dr. Radford Pax about possibly stopping ASA vs plavix     Follow-up with Dr. Radford Pax on scheduled appointment.  Medication Adjustments/Labs and Tests Ordered: Current medicines are reviewed at length with the patient today.  Concerns regarding medicines are outlined above.  No orders of the defined types were placed in this encounter.  No orders of the defined types were placed in this encounter.   Patient Instructions  Medication Instructions:   Your physician recommends that you continue on your current medications as directed. Please refer to the Current Medication list given to you today.  *If you need a refill on your cardiac medications before your next appointment, please call your pharmacy*   Follow-Up:  As scheduled with Dr. Radford Pax in November     [facility],acting as a scribe for Freada Bergeron, MD.,have documented all relevant documentation on the behalf of Freada Bergeron, MD,as directed by  Freada Bergeron, MD while in the presence of Freada Bergeron, MD.  I, Freada Bergeron, MD, have reviewed all documentation for this visit. The documentation on 10/31/20 for the exam, diagnosis, procedures, and orders are all accurate and complete.   Signed, Freada Bergeron, MD  10/31/2020 5:13 PM    [facility]

## 2020-10-31 NOTE — Telephone Encounter (Signed)
Patient cleared from CV standpoint to undergo dental extractions. Can hold plavix (and aspirin if needed) for his procedure.

## 2020-10-31 NOTE — Patient Instructions (Signed)
Medication Instructions:   Your physician recommends that you continue on your current medications as directed. Please refer to the Current Medication list given to you today.  *If you need a refill on your cardiac medications before your next appointment, please call your pharmacy*   Follow-Up:  As scheduled with Dr. Radford Pax in November

## 2020-11-01 ENCOUNTER — Telehealth: Payer: Self-pay

## 2020-11-01 ENCOUNTER — Telehealth: Payer: Self-pay | Admitting: Pharmacist

## 2020-11-01 MED ORDER — OZEMPIC (0.25 OR 0.5 MG/DOSE) 2 MG/1.5ML ~~LOC~~ SOPN
PEN_INJECTOR | SUBCUTANEOUS | 1 refills | Status: DC
Start: 2020-11-01 — End: 2021-01-09

## 2020-11-01 NOTE — Telephone Encounter (Signed)
Spoke with the patient and advised him that he could discontinue taking his aspirin. Patient verbalized understanding.

## 2020-11-01 NOTE — Telephone Encounter (Signed)
-----   Message from Sueanne Margarita, MD sent at 10/31/2020  8:24 PM EDT ----- Thank you so much for seeing [name] can you let him know that he can come off ASA - since he has 2 stents in the prox RCA would prefer to keep on Plavix  Traci ----- Message ----- From: Freada Bergeron, MD Sent: 10/31/2020   5:21 PM EDT To: Sueanne Margarita, MD  Just saw him in clinic for pre-op prior to teeth extraction. He is great and adores you.   He asked if he could come off either ASA or plavix as he is bruising a lot. I said I would check with you. Seems like his last cath looked overall good and last stent was in 2015.  Hope you are having a good day!!  -Water quality scientist

## 2020-11-01 NOTE — Telephone Encounter (Addendum)
Received referral from Dr Johney Frame to initiate [id number] therapy for weight loss. Ozempic and Wegovy both seem to be covered on his insurance. Wegovy prior auth submitted and approved through 06/04/21.  Pt interested in starting therapy. Previously gave himself Humira injections so he feels comfortable with them. States he does not want to drive into clinic for appt due to gas prices and high copay. Is agreeable to virtual visit, only available after 3:30pm, scheduled next available 8/23. He may start injections before we call him to review in more detail but is aware of fridge storage, GI side effects, and portion control.  Will review in more detail at virtual visit. Rx for Ozempic 0.'25mg'$  once weekly sent to pharmacy, confirmed copay is $25 without needing prior auth or copay card. Will plan to titrate and then change to [facility] eventually at 1.'7mg'$  dose that pharmacy is able to order.

## 2020-11-01 NOTE — Telephone Encounter (Signed)
Routed back to the requesting surgeon's office.

## 2020-11-02 NOTE — Telephone Encounter (Signed)
I have faxed over office note from Dr. Johney Frame with clearance notes and recommendation for holding Plavx and ASA.

## 2020-11-02 NOTE — Telephone Encounter (Signed)
I will send back to MD for input on how long to hold Plavix and ASA for dental work.

## 2020-11-05 ENCOUNTER — Other Ambulatory Visit (INDEPENDENT_AMBULATORY_CARE_PROVIDER_SITE_OTHER): Payer: Self-pay | Admitting: Family Medicine

## 2020-11-05 DIAGNOSIS — E559 Vitamin D deficiency, unspecified: Secondary | ICD-10-CM

## 2020-11-06 NOTE — Telephone Encounter (Signed)
Dr.Beasley 

## 2020-11-20 NOTE — Progress Notes (Signed)
Patient ID: Colin Lee                 DOB: Jun 23, 1960                    MRN: [id number]     HPI: Colin Lee is a 60 y.o. male patient referred to pharmacy clinic by Dr. Johney Frame to initiate weight loss therapy with GLP1-RA. PMH is significant for obesity complicated by chronic medical conditions including HTN, CAD, HLD. Most recent BMI 42.33 in July, previous BMI 45.77 in 05/2020. He previously followed with weight loss clinic and had lost 30 lbs per patient report in July prior to starting semaglutide. [facility] prior authorization has been approved through 06/04/21.   Today, patient reports he has been taking Ozempic 0.25 mg weekly for 3 weeks now. Denies any adverse effects. His wife initially helped him with his injection because seeing the needle made him a little nervous (compared to when he used to use Humira he could not see the needle). But after the first injection when he realized it was not painful he has been doing fine. He injects it into his thigh. He did last week's injection without his wife and thought he may not have gotten the dose since he could still see medication in the pen, so he gave another 0.25 mg dose. He realized after that the pen was multi-use and that there would be medication left in the pen, so he thinks he got a total 0.5 mg dose last week but that he didn't have any issues with it. It confused him because his old Humira pens were single use and you couldn't see any medication left in them, but he knows now that Ozempic will be used multiple times. His weight this morning was 282 lbs.   Current weight management medications: semaglutide 0.25 mg weekly  Previously tried meds: None  Current meds that may affect weight: Testosterone (weight gain), prednisone (weight gain)  Baseline weight/BMI: 133.8 kg (BMI 42.33)  Insurance payor: [place] [employer]  Diet:  -Breakfast: [place] sausage, fried egg, yogurt -Lunch: nothing -Dinner: [place]  burger -Snacks: none -Drinks: water, zero calorie drinks Ice  Exercise: walking 7,000 steps per day (walks all day at his job)  Family History: He was adopted.   Social History: Former smoker (quit 2002)  Labs: Lab Results  Component Value Date   HGBA1C 6.5 (H) 05/23/2020    Wt Readings from Last 1 Encounters:  10/31/20 295 lb (133.8 kg)    BP Readings from Last 1 Encounters:  10/31/20 134/70   Pulse Readings from Last 1 Encounters:  10/31/20 85       Component Value Date/Time   CHOL 146 05/23/2020 0945   TRIG 110 05/23/2020 0945   HDL 46 05/23/2020 0945   CHOLHDL 4.1 10/23/2019 0632   VLDL 19 10/23/2019 0632   LDLCALC 80 05/23/2020 0945   LDLCALC 46 09/05/2017 0824    Past Medical History:  Diagnosis Date   Acute febrile illness 10/20/2019   Allergy    Arthralgia 06/03/2014   Arthritis    Asthma    Bacteremia 10/21/2019   CAD (coronary artery disease) 06/20/2014   Cellulitis and abscess of right leg with Bacteremia 10/21/2019   Chest pain 05/31/2014   Chewing difficulty    Cholelithiases 10/26/2013   Claudication ([place]) 04/15/2016   Constipation    COPD (chronic obstructive pulmonary disease) ([place]) 10/21/2019   Coronary artery disease 2016   90%  RCA and 30% LM S/P PCI of RCA   Depression    DOE (dyspnea on exertion)    Quit smoking 2002 - Spirometry 03/29/2019  FEV1 2.25 (55%)  Ratio 0.74 with no resp to saba p ? Prior    Onset June 2020 with cards w/u neg 02/2019 and assoc with atypical cp on prn ppi - 03/29/2019   Walked RA x two laps =  approx 534ft @ fast pace - stopped due to end of study with sats of 92% at the end of the study and mild sob  - 03/29/2019 max rx for GERD      Elevated troponin    Fatty liver    GERD (gastroesophageal reflux disease)    Headache    stress and tension   History of nuclear stress test    Myoview 1/19: EF 61, no ischemia, low risk   Hyperlipidemia    Hypertension    Joint pain    Melanoma ([place]) 11/24/2019   Right upper  back. Superficial spreading. Breslow's 0.48mm. 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     albuterol (VENTOLIN HFA) 108 (90 Base) MCG/ACT inhaler Inhale 1 puff into the lungs every 6 (six) hours as needed for wheezing or shortness of breath.     atorvastatin (LIPITOR) 40 MG tablet One half daily     clopidogrel (PLAVIX) 75 MG tablet TAKE 1 TABLET BY MOUTH ONCE DAILY. 90 tablet 3   famotidine (PEPCID) 20 MG tablet One after supper     Fluticasone-Umeclidin-Vilant (TRELEGY ELLIPTA) 100-62.5-25 MCG/INH AEPB Inhale 100 fluid ounces into the lungs daily at 12 noon.     losartan (COZAAR) 50 MG tablet Take 1 tablet (50 mg total) by mouth daily. 180 tablet 3   metoprolol tartrate (LOPRESSOR) 25 MG tablet Take 0.5 tablets (12.5 mg total) by mouth 2 (two) times daily. 60 tablet 0   Multiple Vitamins-Minerals (CENTRUM ADULTS PO) Take 1 tablet by mouth daily.     mycophenolate (CELLCEPT) 500 MG tablet Take 500 mg by mouth 2 (two) times daily.     nitroGLYCERIN (NITROSTAT) 0.4 MG SL tablet Place 1 tablet (0.4 mg total) under the tongue every 5 (five) minutesas needed for chest pain. 25 tablet 6   Omega-3 Fatty Acids (FISH OIL) 1000 MG CPDR Take 4,000 mg by mouth daily.     OXYGEN Inhale 3 L into the  lungs as needed. O2 at 3L as needed for shortness of breath.     pantoprazole (PROTONIX) 40 MG tablet TAKE 1 TABLET BY MOUTH ONCE DAILY. 90 tablet 3   predniSONE (DELTASONE) 5 MG tablet Take 15 mg by mouth daily with breakfast. Patient stated he  takes two of the 5 mg tablets in the morning     Semaglutide,0.25 or 0.5MG/DOS, (OZEMPIC, 0.25 OR 0.5 MG/DOSE,) 2 MG/1.5ML SOPN Inject 0.39m SQ once weekly for 4 weeks, then increase to 0.565mSQ once weekly for 4 weeks 1.5 mL 1   testosterone cypionate (DEPOTESTOSTERONE CYPIONATE) 200 MG/ML injection Inject 200 mg into the muscle every 30 (thirty) days.     Vitamin D, Ergocalciferol, (DRISDOL) 1.25 MG (50000 UNIT) CAPS capsule Take 1 capsule (50,000 Units total) by mouth every 7 (seven) days. 4 capsule 0   Vitamin D, Ergocalciferol, (DRISDOL) 1.25 MG (50000 UNIT) CAPS capsule Take 1 capsule (50,000 Units total) by mouth every 7 (seven) days. 4 capsule 0   No current facility-administered medications on file prior to visit.    Allergies  Allergen Reactions   Hydroxychloroquine Dermatitis    Skin Rash     Assessment/Plan:  1. Weight loss - Patient has not met goal of at least 5% of body weight loss with comprehensive lifestyle modifications alone in the past 3-6 months. Pharmacotherapy is appropriate to pursue as augmentation. Confirmed patient has no personal or family history of medullary thyroid carcinoma (MTC) or Multiple Endocrine Neoplasia syndrome type 2 (MEN 2). Advised patient on common side effects including nausea, diarrhea, dyspepsia, decreased appetite, and fatigue. Counseled patient on reducing meal size and how to titrate medication to minimize side effects. Counseled patient to call if intolerable side effects or if experiencing dehydration, abdominal pain, or dizziness. Patient will adhere to dietary modifications and will target at least 150 minutes of moderate intensity exercise weekly.   Since first 3 starting doses of Wegovy are on  national backorder, patient was started on Ozempic for the first three months, then [facility] to [facility] month 4 dosing and after. Dosing titration schedule noted below:  Month 1: Inject Ozempic 0.2589mQ once weekly Month 2: Inject Ozempic 0.5mg36mQ once weekly Month 3: Inject Ozempic 1mg 35monce weekly Month 4: Inject Wegovy 1.7mg S28mnce weekly Month 5+: Inject Wegovy 2.4mg SQ20mce weekly   He is doing well on Ozempic 0.25 mg once weekly dose with no adverse effects and has started to lose some weight. He will give himself his fourth 0.25 mg dose this Thursday then increase to 0.5 mg weekly next week. He will call if he has any issues refilling Ozempic at his pharmacy. Reminded him of proper injection technique. Encouraged him to keep up the good work with his diet and exercise.   Follow up in phone call planned in 1 month when time for next dose titration then will have him come back for a 3 month follow up office visit.  MadisonRebbeca PaulD PGY2 Ambulatory Care Pharmacy Resident 11/21/2020 4:30 PM

## 2020-11-21 ENCOUNTER — Other Ambulatory Visit: Payer: Self-pay

## 2020-11-21 ENCOUNTER — Telehealth (INDEPENDENT_AMBULATORY_CARE_PROVIDER_SITE_OTHER): Payer: BC Managed Care – PPO | Admitting: Student-PharmD

## 2020-12-19 ENCOUNTER — Telehealth: Payer: Self-pay | Admitting: Student-PharmD

## 2020-12-19 NOTE — Telephone Encounter (Signed)
Called to see how he is doing on Ozempic. This Thursday is the patient's fourth dose of Ozempic 0.5 mg weekly. Will be due to increase to 1 mg weekly if he is tolerating 0.5 mg well. This will need to be sent in to his pharmacy [facility] in [place]).   Left voicemail requesting call back.

## 2020-12-20 NOTE — Telephone Encounter (Signed)
Called patient again regarding the below but did not reach, LVM requesting call back.

## 2020-12-21 ENCOUNTER — Other Ambulatory Visit: Payer: Self-pay | Admitting: Cardiology

## 2020-12-22 NOTE — Telephone Encounter (Signed)
Called patient on both home and mobile numbers, no answer, LVM requesting call back.

## 2020-12-26 NOTE — Telephone Encounter (Signed)
Called patient back on home phone at requested time but he was not there. The person who answered told me to try his cell phone which he did not answer. Left him a voicemail letting him know that this is now the 6th time I've tried to reach him over the last week and will not reach out again for the time being, however I am happy to talk with him and provide refills if needed if he will return the call at his convenience.  Per my records, he will run out of his current supply of Ozempic this week and if he plans to continue treatment, will need a new prescription for the next dose titration of 1 mg weekly.

## 2020-12-26 NOTE — Telephone Encounter (Signed)
Called patient but he was busy at this time. Requesting call back at 4pm on his home phone number.

## 2020-12-27 NOTE — Telephone Encounter (Signed)
Patient returned call, states that today will be his second dose of Ozempic 0.5 mg weekly and still has 2 weeks left in this pen. Will call him at that time to send in prescription for Ozempic 1 mg if he is still doing well. Reports no adverse effects at this time and starting to notice some weight loss. Will also plan at time of next phone follow up to make an appt in October with PharmD for 3 month follow up since starting Ozempic.

## 2021-01-09 ENCOUNTER — Telehealth: Payer: Self-pay | Admitting: Student-PharmD

## 2021-01-09 MED ORDER — OZEMPIC (1 MG/DOSE) 4 MG/3ML ~~LOC~~ SOPN: 1.0000 mg | PEN_INJECTOR | SUBCUTANEOUS | 0 refills | Status: DC

## 2021-01-09 NOTE — Telephone Encounter (Signed)
Called patient to follow up on Ozempic. He has tolerated 4 weeks of 0.5 mg dose well with no adverse effects reported. Will increase Ozempic to 1 mg weekly, which has been sent in to [facility]. Also scheduled for 3 month f/u office visit on 11/4 at 3:30pm with pharmacist clinic though he notes due to his drive from work it may be closer to 4pm. This visit will be after he will have run out of the 1 mg dose. Will follow up with him by phone around that time to send in [facility] 1.7 mg prescription (PA previously submitted and approved through 06/04/21). He is comfortable with injection technique due to previously using Humira.   He notes that once we switch him to [facility], he would [facility], but for now continue with [facility] for [place] due to having an easier time getting it there.

## 2021-02-01 ENCOUNTER — Telehealth: Payer: Self-pay | Admitting: Student-PharmD

## 2021-02-01 NOTE — Telephone Encounter (Signed)
Called patient to see how he is doing since increasing Ozempic to 1 mg weekly. Unable to reach, left voicemail requesting call back.   If tolerating well, need to send in prescription for Wegovy 1.7 mg weekly to his preferred pharmacy ([facility] - need to confirm). PA has previously been approved through 06/04/21.   He has an appt scheduled with the pharmacy clinic for his 3 month follow up on 11/4 (he will arrive closer to 4pm due to driving from work). We will check weight and BP at that time.

## 2021-02-06 MED ORDER — WEGOVY 1.7 MG/0.75ML ~~LOC~~ SOAJ: 1.7000 mg | SUBCUTANEOUS | 0 refills | Status: DC

## 2021-02-06 NOTE — Telephone Encounter (Signed)
Called and was able to reach the patient. He is doing well on Ozempic 1 mg weekly. He has had 4 doses now and is out. He has tolerated this well. He is agreeable to increase to 1.7 mg weekly which is available as NVBTYO. PA for [facility] previously approved through 06/04/21. He would like this prescription to be sent to [facility] but once he is on the maintenance 2.4 mg dose, to [facility]. Counseled him on the difference between [place] administration and he confirms understanding. Also counseled him that if his copay is >$25 he can sign up for a copay card online to bring the cost down to that.   Reminded him of appt on 11/4 with pharmacy clinic for 3 month follow up visit (scheduled for 3:30 but he will be closer to 4pm due to driving from work).

## 2021-02-09 ENCOUNTER — Other Ambulatory Visit: Payer: Self-pay

## 2021-02-09 ENCOUNTER — Ambulatory Visit (INDEPENDENT_AMBULATORY_CARE_PROVIDER_SITE_OTHER): Payer: BC Managed Care – PPO | Admitting: Pharmacist

## 2021-02-09 VITALS — BP 150/78 | Wt 282.0 lb

## 2021-02-09 DIAGNOSIS — I10 Essential (primary) hypertension: Secondary | ICD-10-CM

## 2021-02-09 MED ORDER — LOSARTAN POTASSIUM 100 MG PO TABS: 100.0000 mg | ORAL_TABLET | Freq: Every day | ORAL | 3 refills | Status: DC

## 2021-02-09 NOTE — Progress Notes (Signed)
Patient ID: Colin Lee                 DOB: July 16, 1960                    MRN: [id number]     HPI: Colin Lee is a 60 y.o. male patient referred to pharmacy clinic by Dr. Johney Frame to initiate weight loss therapy with GLP1-RA. PMH is significant for obesity complicated by chronic medical conditions including HTN, CAD, HLD. Most recent BMI 40.46, previous BMI 42.33 in July. He previously followed with weight loss clinic and had lost 30 lbs per patient report in July prior to starting semaglutide. [facility] prior authorization has been approved through 06/04/21.   Today, patient reports he took his first dose of Wegovy 1.7mg  yesterday. He states that the needle started to sting and he noticed that some ran down his leg. He states that he doesn't feel as hungry and sometimes makes himself eat so that his medications are not on an empty stomach. Drinks a lot of artifical sweeteners. States his BP has been up in the 130's-140's at home. No missed does, no side effects. He has lost 3.6% of his body weight. Concerned about loosing too much weight too quick.  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  Family History: He was adopted.   Social History: Former smoker (quit 2002)  Labs: Lab Results  Component Value Date   HGBA1C 6.5 (H) 05/23/2020    Wt Readings from Last 1  Encounters:  11/21/20 282 lb (127.9 kg)    BP Readings from Last 1 Encounters:  10/31/20 134/70   Pulse Readings from Last 1 Encounters:  10/31/20 85       Component Value Date/Time   CHOL 146 05/23/2020 0945   TRIG 110 05/23/2020 0945   HDL 46 05/23/2020 0945   CHOLHDL 4.1 10/23/2019 0632   VLDL 19 10/23/2019 0632   LDLCALC 80 05/23/2020 0945   LDLCALC 46 09/05/2017 0824    Past Medical History:  Diagnosis Date   Acute febrile illness 10/20/2019   Allergy    Arthralgia 06/03/2014   Arthritis    Asthma    Bacteremia 10/21/2019   CAD (coronary artery disease) 06/20/2014   Cellulitis and abscess of right leg with Bacteremia 10/21/2019   Chest pain 05/31/2014   Chewing difficulty    Cholelithiases 10/26/2013   Claudication ([place]) 04/15/2016   Constipation    COPD (chronic obstructive pulmonary disease) ([place]) 10/21/2019   Coronary artery disease 2016   90% RCA and 30% LM S/P PCI of RCA   Depression    DOE (dyspnea on exertion)    Quit smoking 2002 - Spirometry 03/29/2019  FEV1 2.25 (55%)  Ratio 0.74 with no resp to saba p ? Prior    Onset June 2020 with cards w/u neg 02/2019 and assoc with atypical cp on prn ppi - 03/29/2019  Walked RA x two laps =  approx 564ft @ fast pace - stopped due to end of study with sats of 92% at the end of the study and mild sob  - 03/29/2019 max rx for GERD      Elevated troponin    Fatty liver    GERD (gastroesophageal reflux disease)    Headache    stress and tension   History of nuclear stress test    Myoview 1/19: EF 61, no ischemia, low risk   Hyperlipidemia    Hypertension    Joint pain    Melanoma ([place]) 11/24/2019   Right upper back. Superficial spreading. Breslow's 0.7mm. 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     albuterol (VENTOLIN HFA) 108 (90 Base) MCG/ACT inhaler Inhale 1 puff into the lungs every 6 (six) hours as needed for wheezing or shortness of breath.     atorvastatin (LIPITOR) 40 MG tablet One half daily     clopidogrel (PLAVIX) 75 MG tablet TAKE 1 TABLET BY MOUTH ONCE DAILY. 90 tablet 3   famotidine (PEPCID) 20 MG tablet One after supper     Fluticasone-Umeclidin-Vilant (TRELEGY ELLIPTA) 100-62.5-25 MCG/INH AEPB Inhale 100 fluid ounces into the lungs daily at 12 noon.     losartan (COZAAR) 50 MG tablet TAKE 1 TABLET BY MOUTH ONCE DAILY. 90 tablet 0   metoprolol tartrate (LOPRESSOR) 25 MG tablet Take 0.5 tablets (12.5 mg total) by mouth 2 (two) times daily. 60 tablet 0   Multiple Vitamins-Minerals (CENTRUM ADULTS PO) Take 1 tablet by mouth daily.     mycophenolate (CELLCEPT) 500 MG tablet Take 500 mg by mouth 2 (two) times daily.     nitroGLYCERIN (NITROSTAT) 0.4 MG SL tablet Place 1 tablet (0.4 mg total) under the tongue every 5 (five) minutesas needed for chest pain. 25 tablet 6   Omega-3 Fatty Acids (FISH OIL) 1000 MG CPDR Take 4,000 mg by mouth daily.     OXYGEN Inhale 3 L into the lungs as needed. O2 at 3L as needed for shortness of breath.     pantoprazole (PROTONIX) 40 MG tablet TAKE 1 TABLET BY MOUTH ONCE DAILY. 90 tablet 3   predniSONE (DELTASONE) 5 MG tablet Take 15 mg by mouth daily with breakfast. Patient stated he takes two of the 5 mg tablets in the morning     Semaglutide-Weight Management  (WEGOVY) 1.7 MG/0.75ML SOAJ Inject 1.7 mg into the skin once a week. 3 mL 0   testosterone cypionate (DEPOTESTOSTERONE CYPIONATE) 200 MG/ML injection Inject 200 mg into the muscle every 30 (thirty) days.     Vitamin D, Ergocalciferol, (DRISDOL) 1.25 MG (50000 UNIT) CAPS capsule Take 1  capsule (50,000 Units total) by mouth every 7 (seven) days. 4 capsule 0   Vitamin D, Ergocalciferol, (DRISDOL) 1.25 MG (50000 UNIT) CAPS capsule Take 1 capsule (50,000 Units total) by mouth every 7 (seven) days. 4 capsule 0   No current facility-administered medications on file prior to visit.    Allergies  Allergen Reactions   Hydroxychloroquine Dermatitis    Skin Rash     Assessment/Plan:  1. Weight loss - Patient is doing well on Wegovy. We reviewed diet. I encouraged him to decrease his consumption of artifical sweeteners, avoid processed foods and increase vegetable intake. I recommended the book eat, drink and be healthy. Continue Wegovy 1.7 x 3 more doses then increase to 2.4mg . I encouraged him to increase the amount of time he walks for. Follow up via telephone in 3 weeks. In person in 3 months.  2. HTN- BP elevated in clinic today. Patient also reports elevated BP at home. Will increase losartan to 100mg  daily. Will get a BMP after apt with Dr. Radford Pax in 2 weeks.   Ramond Dial, Pharm.D, BCPS, CPP [place]  [address] [place], [place] [address]  Phone: [phone]; Fax: [phone]

## 2021-02-09 NOTE — Patient Instructions (Addendum)
Please increase your losartan to 100mg  daily. Please go to lab after your visit with Dr. Karen Kitchens resources:  Eat. Sleep. Move. Breath: The Beginner's Guide to Living A Healthy Lifestyle  Eat, Drink, and Be Healthy: The [employer] Guide to [employer] (2007 version)  Zoe podcast  Tips for living a healthier life     Building a Healthy and Balanced Diet Make most of your meal vegetables and fruits -  of your plate. Aim for color and variety, and remember that potatoes don't count as vegetables on the Healthy Eating Plate because of their negative impact on blood sugar.  Go for whole grains -  of your plate. Whole and intact grains--whole wheat, barley, wheat berries, quinoa, oats, brown rice, and foods made with them, such as whole wheat pasta--have a milder effect on blood sugar and insulin than white bread, white rice, and other refined grains.  Protein power -  of your plate. Fish, poultry, beans, and nuts are all healthy, versatile protein sources--they can be mixed into salads, and pair well with vegetables on a plate. Limit red meat, and avoid processed meats such as bacon and sausage.  Healthy plant oils - in moderation. Choose healthy vegetable oils like olive, canola, soy, corn, sunflower, peanut, and others, and avoid partially hydrogenated oils, which contain unhealthy trans fats. Remember that low-fat does not mean "healthy."  Drink water, coffee, or tea. Skip sugary drinks, limit milk and dairy products to one to two servings per day, and limit juice to a small glass per day.  Stay active. The red figure running across the [place] is a reminder that staying active is also important in weight control.  The main message of the Healthy Eating Plate is to focus on diet quality:  The type of carbohydrate in the diet is more important than the amount of carbohydrate in the diet, because some sources of carbohydrate--like  vegetables (other than potatoes), fruits, whole grains, and beans--are healthier than others. The Healthy Eating Plate also advises consumers to avoid sugary beverages, a major source of calories--usually with little nutritional value--in the American diet. The Healthy Eating Plate encourages consumers to use healthy oils, and it does not set a maximum on the percentage of calories people should get each day from healthy sources of fat. In this way, the Healthy Eating Plate recommends the opposite of the low-fat message promoted for decades by the USDA.  [employer]kDistributor.no  SUGAR  Sugar is a huge problem in the modern day diet. Sugar is a big contributor to heart disease, diabetes, high triglyceride levels, fatty liver disease and obesity. Sugar is hidden in almost all packaged foods/beverages. Added sugar is extra sugar that is added beyond what is naturally found and has no nutritional benefit for your body. The American Heart Association recommends limiting added sugars to no more than 25g for women and 36 grams for men per day. There are many names for sugar including maltose, sucrose (names ending in "ose"), high fructose corn syrup, molasses, cane sugar, corn sweetener, raw sugar, syrup, honey or fruit juice concentrate.   One of the best ways to limit your added sugars is to stop drinking sweetened beverages such as soda, sweet tea, and fruit juice.  There is 65g of added sugars in one 20oz bottle of Coke! That is equal to 7.5 donuts.   Pay attention and read all nutrition facts labels. Below is an examples of a nutrition facts label. The #1 is  showing you the total sugars where the # 2 is showing you the added sugars. This one serving has almost the max amount of added sugars per day!     20 oz Soda 65g Sugar = 7.5 Glazed Donuts  16oz Energy  Drink 54g Sugar = 6.5 Glazed Donuts  Large Sweet  Tea 38g Sugar = 4 Glazed Donuts  20oz  Sports  Drink 34g Sugar = 3.5 Glazed Donuts  8oz Chocolate Milk 24g Sugar =2.5 Glazed Donuts  8oz Orange  Juice 21g Sugar = 2 Glazed Donuts  1 Juice Box 14g Sugar = 1.5 Glazed Donuts  16oz Water= NO SUGAR!!  EXERCISE  Exercise is good. We've all heard that. In an ideal world, we would all have time and resources to get plenty of it. When you are active, your heart pumps more efficiently and you will feel better.  Multiple studies show that even walking regularly has benefits that include living a longer life. The American Heart Association recommends 150 minutes per week of exercise (30 minutes per day most days of the week). You can do this in any increment you wish. Nine or more 10-minute walks count. So does an hour-long exercise class. Break the time apart into what will work in your life. Some of the best things you can do include walking briskly, jogging, cycling or swimming laps. Not everyone is ready to "exercise." Sometimes we need to start with just getting active. Here are some easy ways to be more active throughout the day:  Take the stairs instead of the elevator  Go for a 10-15 minute walk during your lunch break (find a friend to make it more enjoyable)  When shopping, park at the back of the parking lot  If you take public transportation, get off one stop early and walk the extra distance  Pace around while making phone calls  Check with your doctor if you aren't sure what your limitations may be. Always remember to drink plenty of water when doing any type of exercise. Don't feel like a failure if you're not getting the 90-150 minutes per week. If you started by being a couch potato, then just a 10-minute walk each day is a huge improvement. Start with little victories and work your way up.   HEALTHY EATING TIPS  When looking to improve your eating habits, whether to lose weight, lower blood pressure or just be healthier, it helps to know what a serving size is.    Grains 1 slice of bread,  bagel,  cup pasta or rice  Vegetables 1 cup fresh or raw vegetables,  cup cooked or canned Fruits 1 piece of medium sized fruit,  cup canned,   Meats/Proteins  cup dried       1 oz meat, 1 egg,  cup cooked beans, nuts or seeds  Dairy        Fats Individual yogurt container, 1 cup (8oz)    1 teaspoon margarine/butter or vegetable  milk or milk alternative, 1 slice of cheese          oil; 1 tablespoon mayonnaise or salad dressing                  Plan ahead: make a menu of the meals for a week then create a grocery list to go with that menu. Consider meals that easily stretch into a night of leftovers, such as stews or casseroles. Or consider making two of your favorite meal and put one in  the freezer for another night. Try a night or two each week that is "meatless" or "no cook" such as salads. When you get home from the grocery store wash and prepare your vegetables and fruits. Then when you need them they are ready to go.   Tips for going to the grocery store:  [place] store or generic brands  Check the weekly ad from your store on-line or in their in-store flyer  Look at the unit price on the shelf tag to compare/contrast the costs of different items  Buy fruits/vegetables in season  Carrots, bananas and apples are low-cost, naturally healthy items  If meats or frozen vegetables are on sale, buy some extras and put in your freezer  Limit buying prepared or "ready to eat" items, even if they are pre-made salads or fruit snacks  Do not shop when you're hungry  Foods at eye level tend to be more expensive. Look on the high and low shelves for deals.  Consider shopping at the farmer's market for fresh foods in season.  Avoid the cookie and chip aisles (these are expensive, high in calories and low in nutritional value). Shop on the outside of the grocery store.  Healthy food preparations:  If you can't get lean hamburger, be sure to drain the fat when  cooking  Steam, saut (in olive oil), grill or bake foods  Experiment with different seasonings to avoid adding salt to your foods. Kosher salt, sea salt and Himalayan salt are all still salt and should be avoided. Try seasoning food with onion, garlic, thyme, rosemary, basil ect. Onion powder or garlic powder is ok. Avoid if it says salt (ie garlic salt).

## 2021-02-20 ENCOUNTER — Other Ambulatory Visit: Payer: Self-pay

## 2021-02-20 ENCOUNTER — Other Ambulatory Visit: Payer: BC Managed Care – PPO | Admitting: *Deleted

## 2021-02-20 ENCOUNTER — Ambulatory Visit: Payer: BC Managed Care – PPO | Admitting: Cardiology

## 2021-02-20 ENCOUNTER — Encounter: Payer: Self-pay | Admitting: Cardiology

## 2021-02-20 VITALS — BP 144/88 | HR 74 | Ht 70.0 in | Wt 283.0 lb

## 2021-02-20 DIAGNOSIS — I10 Essential (primary) hypertension: Secondary | ICD-10-CM

## 2021-02-20 DIAGNOSIS — R0602 Shortness of breath: Secondary | ICD-10-CM

## 2021-02-20 DIAGNOSIS — E785 Hyperlipidemia, unspecified: Secondary | ICD-10-CM | POA: Diagnosis not present

## 2021-02-20 DIAGNOSIS — I2510 Atherosclerotic heart disease of native coronary artery without angina pectoris: Secondary | ICD-10-CM | POA: Diagnosis not present

## 2021-02-20 MED ORDER — ATORVASTATIN CALCIUM 40 MG PO TABS: ORAL_TABLET | ORAL | 3 refills | Status: DC

## 2021-02-20 MED ORDER — METOPROLOL TARTRATE 25 MG PO TABS: 12.5000 mg | ORAL_TABLET | Freq: Two times a day (BID) | ORAL | 3 refills | Status: DC

## 2021-02-20 MED ORDER — LOSARTAN POTASSIUM 100 MG PO TABS: 100.0000 mg | ORAL_TABLET | Freq: Every day | ORAL | 3 refills | Status: DC

## 2021-02-20 MED ORDER — CLOPIDOGREL BISULFATE 75 MG PO TABS: 75.0000 mg | ORAL_TABLET | Freq: Every day | ORAL | 3 refills | Status: DC

## 2021-02-20 NOTE — Patient Instructions (Signed)
Medication Instructions:  Your physician recommends that you continue on your current medications as directed. Please refer to the Current Medication list given to you today.  *If you need a refill on your cardiac medications before your next appointment, please call your pharmacy*   Lab Work: Fasting lipids and CMET - take orders to your primary care.  If you have labs (blood work) drawn today and your tests are completely normal, you will receive your results only by: [place] (if you have MyChart) OR A paper copy in the mail If you have any lab test that is abnormal or we need to change your treatment, we will call you to review the results.   Follow-Up: At [facility], you and your health needs are our priority.  As part of our continuing mission to provide you with exceptional heart care, we have created designated Provider Care Teams.  These Care Teams include your primary Cardiologist (physician) and Advanced Practice Providers (APPs -  Physician Assistants and Nurse Practitioners) who all work together to provide you with the care you need, when you need it.   Your next appointment:   1 year(s)  The format for your next appointment:   In Person  Provider:   Fransico Him, MD

## 2021-02-20 NOTE — Progress Notes (Signed)
Cardiology Office Note:    Date:  02/20/2021   ID:  Colin Lee, DOB November 04, 1960, MRN [id number]  PCP:  [name], MD  Cardiologist:  Fransico Him, MD    Referring MD: [name], MD   Chief Complaint  Patient presents with   Coronary Artery Disease   Hypertension   Hyperlipidemia   Shortness of Breath     History of Present Illness:    Colin Lee is a 60 y.o. male with a hx of recurrent left pleural effusion s/p drainage and talc pleurodesis with Dr. [name].  He has a history of ASCAD with cath showing high grade proximal RCA stenosis that was treated with DES x 2.  He had recurrent CP post PCI.  Re-look cath demonstrated patent stents in the RCA.  He has a history of moderate pleural effusion and community acquired pneumonia. He underwent thoracocentesis and was found to have bloody effusion with fluid analysis showing lymphocytes. ID and pulmonary consulted and suggested this could be rheumatologic in origin and rheumatology work up ordered and was negative.  No malignancy noted.   In 2020 he complained of shortness of breath and Lexiscan myoview showed no ischemia and 2D echo was normal.    He is here today for followup and is doing well.  He denies any chest pain or pressure, SOB, DOE, PND, orthopnea, LE edema, dizziness, palpitations or syncope. He complaints that he gets easy bruising which improved after going off ASA but still present to some degree.  He is compliant with his meds and is tolerating meds with no SE.     Past Medical History:  Diagnosis Date   Acute febrile illness 10/20/2019   Allergy    Arthralgia 06/03/2014   Arthritis    Asthma    Bacteremia 10/21/2019   Cellulitis and abscess of right leg with Bacteremia 10/21/2019   Chest pain 05/31/2014   Chewing difficulty    Cholelithiases 10/26/2013   Claudication ([place]) 04/15/2016   Constipation    COPD (chronic obstructive pulmonary disease) ([place]) 10/21/2019   Coronary artery  disease 2016   90% RCA and 30% LM S/P PCI of RCA   Depression    DOE (dyspnea on exertion)    Quit smoking 2002 - Spirometry 03/29/2019  FEV1 2.25 (55%)  Ratio 0.74 with no resp to saba p ? Prior    Onset June 2020 with cards w/u neg 02/2019 and assoc with atypical cp on prn ppi - 03/29/2019   Walked RA x two laps =  approx 538ft @ fast pace - stopped due to end of study with sats of 92% at the end of the study and mild sob  - 03/29/2019 max rx for GERD      Elevated troponin    Fatty liver    GERD (gastroesophageal reflux disease)    Headache    stress and tension   History of nuclear stress test    Myoview 1/19: EF 61, no ischemia, low risk   Hyperlipidemia    Hypertension    Joint pain    Melanoma ([place]) 11/24/2019   Right upper back. Superficial spreading. Breslow's 0.32mm. 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   Tubular adenoma of colon 03/2013    Past Surgical History:  Procedure Laterality Date   CHEST TUBE INSERTION Left 02/01/2014   Procedure: INSERTION PLEURAL DRAINAGE CATHETER;  Surgeon: Grace Isaac, MD;  Location: [employer] [facility];  Service: Thoracic;  Laterality: Left;   COLONOSCOPY     COLONOSCOPY W/ BIOPSIES AND POLYPECTOMY  2014   benign   LEFT HEART CATHETERIZATION WITH CORONARY ANGIOGRAM N/A 06/07/2014   Procedure: LEFT HEART CATHETERIZATION WITH CORONARY ANGIOGRAM;  Surgeon: Peter M Martinique, MD;  Location: [facility] CATH LAB;  Service: Cardiovascular;  Laterality: N/A;   LEFT HEART CATHETERIZATION  WITH CORONARY ANGIOGRAM N/A 06/08/2014   Procedure: LEFT HEART CATHETERIZATION WITH CORONARY ANGIOGRAM;  Surgeon: Sinclair Grooms, MD;  Location: [facility] CATH LAB;  Service: Cardiovascular;  Laterality: N/A;   MASS EXCISION N/A 10/25/2019   Procedure: SKIN BIOPSY;  Surgeon: Aviva Signs, MD;  Location: [facility];  Service: General;  Laterality: N/A;   no prior surgery     PLEURAL BIOPSY Left 02/01/2014   Procedure: PLEURAL BIOPSY;  Surgeon: Grace Isaac, MD;  Location: [place];  Service: Thoracic;  Laterality: Left;   PLEURAL EFFUSION DRAINAGE Left 02/01/2014   Procedure: DRAINAGE OF PLEURAL EFFUSION;  Surgeon: Grace Isaac, MD;  Location: [place];  Service: Thoracic;  Laterality: Left;   REMOVAL OF PLEURAL DRAINAGE CATHETER Left 02/16/2014   Procedure: REMOVAL OF PLEURAL DRAINAGE CATHETER;  Surgeon: Grace Isaac, MD;  Location: [place];  Service: Thoracic;  Laterality: Left;   RIGHT/LEFT HEART CATH AND CORONARY ANGIOGRAPHY N/A 05/16/2020   Procedure: RIGHT/LEFT HEART CATH AND CORONARY ANGIOGRAPHY;  Surgeon: Troy Sine, MD;  Location: [place] CV LAB;  Service: Cardiovascular;  Laterality: N/A;   TALC PLEURODESIS Left 02/01/2014   Procedure: Pietro Cassis;  Surgeon: Grace Isaac, MD;  Location: [place];  Service: Thoracic;  Laterality: Left;   THORACENTESIS Left 2015   VIDEO ASSISTED THORACOSCOPY Left 02/01/2014   Procedure: VIDEO ASSISTED THORACOSCOPY;  Surgeon: Grace Isaac, MD;  Location: [place];  Service: Thoracic;  Laterality: Left;   VIDEO BRONCHOSCOPY N/A 02/01/2014   Procedure: VIDEO BRONCHOSCOPY;  Surgeon: Grace Isaac, MD;  Location: [facility];  Service: Thoracic;  Laterality: N/A;    Current Medications: Current Meds  Medication Sig   acetaminophen (TYLENOL) 500 MG tablet Take 1,000 mg by mouth every 6 (six) hours as needed for moderate pain.   atorvastatin (LIPITOR) 40 MG tablet One half daily   clopidogrel (PLAVIX) 75 MG tablet TAKE 1 TABLET BY MOUTH ONCE  DAILY.   famotidine (PEPCID) 20 MG tablet One after supper   Fluticasone-Umeclidin-Vilant (TRELEGY ELLIPTA) 100-62.5-25 MCG/INH AEPB Inhale 100 fluid ounces into the lungs daily at 12 noon.   losartan (COZAAR) 100 MG tablet Take 1 tablet (100 mg total) by mouth daily.   metoprolol tartrate (LOPRESSOR) 25 MG tablet Take 0.5 tablets (12.5 mg total) by mouth 2 (two) times daily.   Multiple Vitamins-Minerals (CENTRUM ADULTS PO) Take 1 tablet by mouth daily.   mycophenolate (CELLCEPT) 500 MG tablet Take 500 mg by mouth 2 (two) times daily.   nitroGLYCERIN (NITROSTAT) 0.4 MG SL tablet Place 1 tablet (0.4 mg total) under the tongue every 5 (  five) minutesas needed for chest pain.   Omega-3 Fatty Acids (FISH OIL) 1000 MG CPDR Take 4,000 mg by mouth daily.   OXYGEN Inhale 3 L into the lungs as needed. O2 at 3L as needed for shortness of breath.   pantoprazole (PROTONIX) 40 MG tablet TAKE 1 TABLET BY MOUTH ONCE DAILY.   predniSONE (DELTASONE) 5 MG tablet Take 15 mg by mouth daily with breakfast. Patient stated he takes two of the 5 mg tablets in the morning   Semaglutide-Weight Management (WEGOVY) 1.7 MG/0.75ML SOAJ Inject 1.7 mg into the skin once a week.   testosterone cypionate (DEPOTESTOSTERONE CYPIONATE) 200 MG/ML injection Inject 200 mg into the muscle every 30 (thirty) days.   Vitamin D, Ergocalciferol, (DRISDOL) 1.25 MG (50000 UNIT) CAPS capsule Take 1 capsule (50,000 Units total) by mouth every 7 (seven) days.     Allergies:   Hydroxychloroquine   Social History   Socioeconomic History   Marital status: Married    Spouse name: Sherri   Number of children: Not on file   Years of education: Not on file   Highest education level: Not on file  Occupational History   Occupation: Facility Maintenance  Tobacco Use   Smoking status: Former    Packs/day: 1.00    Years: 30.00    Pack years: 30.00    Types: Cigarettes    Quit date: 04/08/2000    Years since quitting: 20.8   Smokeless tobacco:  Never  Vaping Use   Vaping Use: Never used  Substance and Sexual Activity   Alcohol use: Yes   Drug use: No   Sexual activity: Yes    Partners: Female  Other Topics Concern   Not on file  Social History Narrative   Not on file   Social Determinants of Health   Financial Resource Strain: Not on file  Food Insecurity: Not on file  Transportation Needs: Not on file  Physical Activity: Not on file  Stress: Not on file  Social Connections: Not on file     Family History: The patient's family history is negative for Colon cancer, Esophageal cancer, Rectal cancer, and Stomach cancer. He was adopted.  ROS:   Please see the history of present illness.    ROS  All other systems reviewed and negative.   EKGs/Labs/Other Studies Reviewed:    The following studies were reviewed today: none none EKG:  EKG is  not ordered today   Recent Labs: 05/15/2020: B Natriuretic Peptide 87.0 05/17/2020: Hemoglobin 12.7; Platelets 262; TSH 1.707 05/23/2020: ALT 79; BUN 17; Creatinine, Ser 0.90; Potassium 4.7; Sodium 141   Recent Lipid Panel    Component Value Date/Time   CHOL 146 05/23/2020 0945   TRIG 110 05/23/2020 0945   HDL 46 05/23/2020 0945   CHOLHDL 4.1 10/23/2019 0632   VLDL 19 10/23/2019 0632   LDLCALC 80 05/23/2020 0945   LDLCALC 46 09/05/2017 0824    Physical Exam:    VS:  BP (!) 144/88   Pulse 74   Ht 5\' 10"  (1.778 m)   Wt 283 lb (128.4 kg)   SpO2 98%   BMI 40.61 kg/m     Wt Readings from Last 3 Encounters:  02/20/21 283 lb (128.4 kg)  02/09/21 282 lb (127.9 kg)  11/21/20 282 lb (127.9 kg)    GEN: Well nourished, well developed in no acute distress HEENT: Normal NECK: No JVD; No carotid bruits LYMPHATICS: No lymphadenopathy CARDIAC:RRR, no murmurs, rubs, gallops RESPIRATORY:  Clear to auscultation without  rales, wheezing or rhonchi  ABDOMEN: Soft, non-tender, non-distended MUSCULOSKELETAL:  No edema; No deformity  SKIN: Warm and dry NEUROLOGIC:  Alert and  oriented x 3 PSYCHIATRIC:  Normal affect   ASSESSMENT:    1. Coronary artery disease involving native coronary artery of native heart without angina pectoris   2. Primary hypertension   3. Dyslipidemia   4. SOB (shortness of breath)    PLAN:    In order of problems listed above:  1.  ASCAD -cath 2016 showed high grade proximal RCA stenosis that was treated with DES x 2.  He had recurrent CP post PCI.  Re-look cath demonstrated patent stents in the RCA.  -nuclear stress test at last OV for CP and SOB showed no ischemia 02/2019. -He denies any anginal symptoms -Continue prescription drug management with aspirin 81 mg daily, Plavix 75 mg daily, Lopressor 12.5 mg twice daily and atorvastatin 40 mg daily with as needed refills   2.  HTN -BP is borderline controlled on exam today -Prescription drug management losartan 100 mg daily and Lopressor 12.5 mg twice daily with as needed refills continue Losartan 100mg  daily and lopressor 12.5mg  BID -I have asked him to check his BP daily for a week and call with results -Check bmet  3.  HLD -LDL goal < 70 -Check FLP and ALT -Continue prescription drug management with atorvastatin 40 mg daily with as needed refills  4.  SOB -felt to be pulmonary in origin last year and obesity -2D echo and lexi myoview were normal in 2020    Medication Adjustments/Labs and Tests Ordered: Current medicines are reviewed at length with the patient today.  Concerns regarding medicines are outlined above.  No orders of the defined types were placed in this encounter.  No orders of the defined types were placed in this encounter.   Signed, Fransico Him, MD  02/20/2021 3:51 PM    [place]

## 2021-02-21 LAB — BASIC METABOLIC PANEL
BUN/Creatinine Ratio: 17 (ref 9–20)
BUN: 18 mg/dL (ref 6–24)
CO2: 23 mmol/L (ref 20–29)
Calcium: 9 mg/dL (ref 8.7–10.2)
Chloride: 103 mmol/L (ref 96–106)
Creatinine, Ser: 1.09 mg/dL (ref 0.76–1.27)
Glucose: 83 mg/dL (ref 70–99)
Potassium: 4.4 mmol/L (ref 3.5–5.2)
Sodium: 139 mmol/L (ref 134–144)
eGFR: 79 mL/min/{1.73_m2} (ref >59)

## 2021-02-22 ENCOUNTER — Telehealth: Payer: Self-pay | Admitting: Pharmacist

## 2021-02-22 MED ORDER — AMLODIPINE BESYLATE 2.5 MG PO TABS: 2.5000 mg | ORAL_TABLET | Freq: Every day | ORAL | 3 refills | Status: DC

## 2021-02-22 NOTE — Telephone Encounter (Signed)
Called pt to discuss lab results. BMP stable. Pt states his BP has been in the 160's. Will start amlodipine 2.5mg  daily. Follow up in 1 month in clinic.  Pt still has 2 -3 more injections of Wegovy 1.7 before he goes up to 2.4mg . He needs f/u schedule for this in Feb. Will do this at his apt in Dec.

## 2021-02-28 ENCOUNTER — Telehealth: Payer: Self-pay | Admitting: Cardiology

## 2021-02-28 ENCOUNTER — Other Ambulatory Visit: Payer: Self-pay | Admitting: Cardiology

## 2021-02-28 NOTE — Telephone Encounter (Signed)
Spoke with [facility] and advised on diagnostic codes for patient's labs. She also states that the patient was not fasting for the lab work.

## 2021-02-28 NOTE — Telephone Encounter (Signed)
[facility] is stating that diagnostic code is missing from the lab order and would also like to know if lipid lab is a fasting lab.. please advise

## 2021-03-01 LAB — COMPLETE METABOLIC PANEL WITH GFR
AG Ratio: 1.7 (calc) (ref 1.0–2.5)
ALT: 17 U/L (ref 9–46)
AST: 13 U/L (ref 10–35)
Albumin: 4.1 g/dL (ref 3.6–5.1)
Alkaline phosphatase (APISO): 66 U/L (ref 35–144)
BUN: 21 mg/dL (ref 7–25)
CO2: 26 mmol/L (ref 20–32)
Calcium: 9.2 mg/dL (ref 8.6–10.3)
Chloride: 106 mmol/L (ref 98–110)
Creat: 1.01 mg/dL (ref 0.70–1.30)
Globulin: 2.4 g/dL (calc) (ref 1.9–3.7)
Glucose, Bld: 101 mg/dL — ABNORMAL HIGH (ref 65–99)
Potassium: 4 mmol/L (ref 3.5–5.3)
Sodium: 140 mmol/L (ref 135–146)
Total Bilirubin: 0.4 mg/dL (ref 0.2–1.2)
Total Protein: 6.5 g/dL (ref 6.1–8.1)
eGFR: 86 mL/min/{1.73_m2} (ref >=60)

## 2021-03-01 LAB — LIPID PANEL
Cholesterol: 128 mg/dL (ref <200)
HDL: 41 mg/dL (ref >=40)
LDL Cholesterol (Calc): 70 mg/dL (calc)
Non-HDL Cholesterol (Calc): 87 mg/dL (calc) (ref <130)
Total CHOL/HDL Ratio: 3.1 (calc) (ref <5.0)
Triglycerides: 85 mg/dL (ref <150)

## 2021-03-06 ENCOUNTER — Telehealth: Payer: Self-pay | Admitting: Student-PharmD

## 2021-03-06 NOTE — Telephone Encounter (Signed)
Patient returned call. Stated that he took his 3rd 1.7 mg dose last Sunday (11/20) and started having horrible nausea, diarrhea, heartburn, and ultimately vomited on Tuesday and started to feel better. He could tell that it was his meal on Saturday that he vomited. He didn't overeat to cause the issue, just his normal amount. He is on a 1200 calorie diet per the weight management clinic. He never had any issues on Ozempic. Offered to switch him back to 1 mg of Ozempic but he declined, stating he does not want to take any of it anymore at this time. He reported always having a little bit of heartburn/nausea on the medication but this was the worst it has been. He felt so sick he thought he had the flu but knows it was due to the medicine. Encouraged him to let us know if he ever changes his mind in the future regarding resuming Ozempic but to continue the good work with his diet and exercise. He will keep his appointment with Melissa on 12/19 for HTN f/u.

## 2021-03-06 NOTE — Telephone Encounter (Signed)
Called patient to see how he is doing on Wegovy 1.7 mg weekly. If he is tolerating this well, he will be due to increase to 2.4 mg this Thursday (not yet sent in). He was not able to talk at this time and states he will call me back when he gets off work.

## 2021-03-22 ENCOUNTER — Other Ambulatory Visit: Payer: Self-pay | Admitting: Cardiology

## 2021-03-25 NOTE — Progress Notes (Unsigned)
Patient ID: Colin Lee                 DOB: 22-Aug-1960                      MRN: [id number]     HPI: Colin Lee is a 60 y.o. male referred by Dr. Johney Frame to HTN clinic. PMH is significant for obesity complicated by chronic medical conditions including CAD, HLD.   At last visit with PharmD, BP was elevated and losartan was increased to 100 mg daily. BMET was checked and stable. Patient then saw Dr. Radford Pax who requested the patient check his BP for a week and then follow-up. Home BP readings were in the 160s so PharmD started amlodipine 2.5 mg daily.  -any issues with amlodipine (new), check for LLE -home BP readings, calibrate home monitor -check BP --plan: inc amlodipine to 5-10 mg daily, if LLE chlorthalidone 12.5 mg daily, could also switch metop to carvedilol  Current HTN meds: losartan 100 mg daily, Lopressor 12.5 mg BID, amlodipine 2.5 mg daily BP goal: <130/80 mmHg  Family History: He was adopted  Social History: Former smoker (quit 2002)  Diet:  -Breakfast: 2 [place] sausage, 2 eggs, sugar free oj, coffee -Lunch: nothing or couple grapes -Dinner: [place] burger, chicken and dumplings, [place] tacos, tomatoes, black olives, lasanga -Snacks: none -Drinks: water, zero calorie drinks Ice, milos diet tea, lots of artificial sweeteners   Exercise: walking 7,000 steps per day (walks all day at his job), walks with his wife about a mile a few days a week.  Home BP readings:    Wt Readings from Last 3 Encounters:  02/20/21 283 lb (128.4 kg)  02/09/21 282 lb (127.9 kg)  11/21/20 282 lb (127.9 kg)   BP Readings from Last 3 Encounters:  02/20/21 (!) 144/88  02/09/21 (!) 150/78  10/31/20 134/70   Pulse Readings from Last 3 Encounters:  02/20/21 74  10/31/20 85  09/27/20 86    Renal function: CrCl cannot be calculated (Patient's most recent lab result is older than the maximum 21 days allowed.).  Past Medical History:  Diagnosis Date   Acute febrile  illness 10/20/2019   Allergy    Arthralgia 06/03/2014   Arthritis    Asthma    Bacteremia 10/21/2019   Cellulitis and abscess of right leg with Bacteremia 10/21/2019   Chest pain 05/31/2014   Chewing difficulty    Cholelithiases 10/26/2013   Claudication ([place]) 04/15/2016   Constipation    COPD (chronic obstructive pulmonary disease) ([place]) 10/21/2019   Coronary artery disease 2016   90% RCA and 30% LM S/P PCI of RCA   Depression    DOE (dyspnea on exertion)    Quit smoking 2002 - Spirometry 03/29/2019  FEV1 2.25 (55%)  Ratio 0.74 with no resp to saba p ? Prior    Onset June 2020 with cards w/u neg 02/2019 and assoc with atypical cp on prn ppi - 03/29/2019   Walked RA x two laps =  approx 558ft @ fast pace - stopped due to end of study with sats of 92% at the end of the study and mild sob  - 03/29/2019 max rx for GERD      Elevated troponin    Fatty liver    GERD (gastroesophageal reflux disease)    Headache    stress and tension   History of nuclear stress test    Myoview 1/19: EF 61, no ischemia, low risk  Hyperlipidemia    Hypertension    Joint pain    Melanoma ([place]) 11/24/2019   Right upper back. Superficial spreading. Breslow's 0.55mm. 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     albuterol (VENTOLIN HFA) 108 (90 Base) MCG/ACT inhaler Inhale 1 puff into the lungs every 6 (six) hours as needed for wheezing or shortness of breath. (Patient not taking: Reported on 02/20/2021)     amLODipine (NORVASC) 2.5 MG tablet Take 1 tablet (2.5 mg total) by mouth daily. 90 tablet 3   atorvastatin (LIPITOR) 40 MG tablet One half daily 45 tablet 3   clopidogrel (PLAVIX) 75 MG tablet Take 1 tablet (75 mg total) by mouth daily. 90 tablet 3   famotidine (PEPCID) 20 MG tablet One after supper     Fluticasone-Umeclidin-Vilant (TRELEGY ELLIPTA) 100-62.5-25 MCG/INH AEPB Inhale 100 fluid ounces into the lungs daily at 12 noon.     losartan (COZAAR) 100 MG tablet Take 1 tablet (100 mg total) by mouth daily. 90 tablet 3   metoprolol tartrate (LOPRESSOR) 25 MG tablet Take 0.5 tablets (12.5 mg total) by mouth 2 (two) times daily. 90 tablet 3   Multiple Vitamins-Minerals (CENTRUM ADULTS PO) Take 1 tablet by mouth daily.     mycophenolate (CELLCEPT) 500 MG tablet Take 500 mg by mouth 2 (two) times daily.     nitroGLYCERIN (NITROSTAT) 0.4 MG SL tablet Place 1 tablet (0.4 mg total) under the tongue every 5 (five) minutesas needed for chest pain. 25 tablet 6   Omega-3 Fatty Acids (FISH OIL) 1000 MG CPDR Take 4,000 mg by mouth daily.     OXYGEN Inhale 3 L into the lungs as needed. O2 at 3L as needed for shortness of breath.     pantoprazole (PROTONIX) 40 MG tablet TAKE 1 TABLET BY MOUTH ONCE DAILY. 90 tablet 3   predniSONE (DELTASONE) 5 MG tablet Take 15 mg by mouth daily with breakfast. Patient stated he takes two of the 5 mg tablets in the morning     testosterone cypionate (DEPOTESTOSTERONE CYPIONATE) 200 MG/ML injection Inject 200 mg into the muscle every 30 (thirty) days.     Vitamin D, Ergocalciferol, (DRISDOL) 1.25 MG (50000 UNIT) CAPS capsule Take 1 capsule (50,000 Units total) by mouth every 7 (seven) days. 4 capsule 0   No current facility-administered medications on file prior  to visit.    Allergies  Allergen Reactions   Hydroxychloroquine Dermatitis    Skin Rash     Assessment/Plan:  1. Hypertension -

## 2021-03-26 ENCOUNTER — Ambulatory Visit: Payer: BC Managed Care – PPO

## 2021-07-02 ENCOUNTER — Other Ambulatory Visit: Payer: Self-pay | Admitting: Cardiology

## 2021-07-25 ENCOUNTER — Other Ambulatory Visit: Payer: Self-pay | Admitting: Internal Medicine

## 2021-11-14 ENCOUNTER — Encounter (INDEPENDENT_AMBULATORY_CARE_PROVIDER_SITE_OTHER): Payer: Self-pay

## 2021-11-15 DIAGNOSIS — E291 Testicular hypofunction: Secondary | ICD-10-CM | POA: Diagnosis not present

## 2021-11-15 DIAGNOSIS — R7303 Prediabetes: Secondary | ICD-10-CM | POA: Diagnosis not present

## 2021-11-15 DIAGNOSIS — E782 Mixed hyperlipidemia: Secondary | ICD-10-CM | POA: Diagnosis not present

## 2021-11-15 DIAGNOSIS — Z955 Presence of coronary angioplasty implant and graft: Secondary | ICD-10-CM | POA: Diagnosis not present

## 2021-11-15 DIAGNOSIS — I10 Essential (primary) hypertension: Secondary | ICD-10-CM | POA: Diagnosis not present

## 2021-11-21 ENCOUNTER — Encounter: Payer: Self-pay | Admitting: Physician Assistant

## 2021-11-21 DIAGNOSIS — M0609 Rheumatoid arthritis without rheumatoid factor, multiple sites: Secondary | ICD-10-CM | POA: Diagnosis not present

## 2021-11-29 DIAGNOSIS — E291 Testicular hypofunction: Secondary | ICD-10-CM | POA: Diagnosis not present

## 2021-12-24 DIAGNOSIS — E291 Testicular hypofunction: Secondary | ICD-10-CM | POA: Diagnosis not present

## 2021-12-27 ENCOUNTER — Ambulatory Visit: Payer: BC Managed Care – PPO | Admitting: Physician Assistant

## 2022-01-16 DIAGNOSIS — M0609 Rheumatoid arthritis without rheumatoid factor, multiple sites: Secondary | ICD-10-CM | POA: Diagnosis not present

## 2022-01-25 DIAGNOSIS — Z23 Encounter for immunization: Secondary | ICD-10-CM | POA: Diagnosis not present

## 2022-01-25 DIAGNOSIS — E291 Testicular hypofunction: Secondary | ICD-10-CM | POA: Diagnosis not present

## 2022-02-01 ENCOUNTER — Encounter: Payer: Self-pay | Admitting: *Deleted

## 2022-02-04 ENCOUNTER — Encounter: Payer: Self-pay | Admitting: Physician Assistant

## 2022-02-04 ENCOUNTER — Other Ambulatory Visit (INDEPENDENT_AMBULATORY_CARE_PROVIDER_SITE_OTHER): Payer: BC Managed Care – PPO

## 2022-02-04 ENCOUNTER — Telehealth: Payer: Self-pay | Admitting: *Deleted

## 2022-02-04 ENCOUNTER — Ambulatory Visit: Payer: BC Managed Care – PPO | Admitting: Physician Assistant

## 2022-02-04 VITALS — BP 122/60 | HR 68 | Ht 70.0 in | Wt 247.4 lb

## 2022-02-04 DIAGNOSIS — Z8719 Personal history of other diseases of the digestive system: Secondary | ICD-10-CM

## 2022-02-04 DIAGNOSIS — Z7901 Long term (current) use of anticoagulants: Secondary | ICD-10-CM

## 2022-02-04 DIAGNOSIS — D649 Anemia, unspecified: Secondary | ICD-10-CM

## 2022-02-04 DIAGNOSIS — I2510 Atherosclerotic heart disease of native coronary artery without angina pectoris: Secondary | ICD-10-CM | POA: Diagnosis not present

## 2022-02-04 DIAGNOSIS — Z86010 Personal history of colonic polyps: Secondary | ICD-10-CM

## 2022-02-04 LAB — COMPREHENSIVE METABOLIC PANEL
ALT: 14 U/L (ref 0–53)
AST: 15 U/L (ref 0–37)
Albumin: 4 g/dL (ref 3.5–5.2)
Alkaline Phosphatase: 58 U/L (ref 39–117)
BUN: 15 mg/dL (ref 6–23)
CO2: 27 mEq/L (ref 19–32)
Calcium: 8.8 mg/dL (ref 8.4–10.5)
Chloride: 106 mEq/L (ref 96–112)
Creatinine, Ser: 0.96 mg/dL (ref 0.40–1.50)
GFR: 86.6 mL/min (ref >60.00)
Glucose, Bld: 98 mg/dL (ref 70–99)
Potassium: 4 mEq/L (ref 3.5–5.1)
Sodium: 141 mEq/L (ref 135–145)
Total Bilirubin: 0.4 mg/dL (ref 0.2–1.2)
Total Protein: 6.2 g/dL (ref 6.0–8.3)

## 2022-02-04 LAB — CBC WITH DIFFERENTIAL/PLATELET
Basophils Absolute: 0.1 10*3/uL (ref 0.0–0.1)
Basophils Relative: 0.7 % (ref 0.0–3.0)
Eosinophils Absolute: 0.3 10*3/uL (ref 0.0–0.7)
Eosinophils Relative: 4.3 % (ref 0.0–5.0)
HCT: 35.4 % — ABNORMAL LOW (ref 39.0–52.0)
Hemoglobin: 11.7 g/dL — ABNORMAL LOW (ref 13.0–17.0)
Lymphocytes Relative: 27.9 % (ref 12.0–46.0)
Lymphs Abs: 2.2 10*3/uL (ref 0.7–4.0)
MCHC: 33 g/dL (ref 30.0–36.0)
MCV: 82.4 fl (ref 78.0–100.0)
Monocytes Absolute: 0.7 10*3/uL (ref 0.1–1.0)
Monocytes Relative: 9.4 % (ref 3.0–12.0)
Neutro Abs: 4.5 10*3/uL (ref 1.4–7.7)
Neutrophils Relative %: 57.7 % (ref 43.0–77.0)
Platelets: 237 10*3/uL (ref 150.0–400.0)
RBC: 4.3 Mil/uL (ref 4.22–5.81)
RDW: 15.9 % — ABNORMAL HIGH (ref 11.5–15.5)
WBC: 7.7 10*3/uL (ref 4.0–10.5)

## 2022-02-04 LAB — IBC + FERRITIN
Ferritin: 6.3 ng/mL — ABNORMAL LOW (ref 22.0–322.0)
Iron: 32 ug/dL — ABNORMAL LOW (ref 42–165)
Saturation Ratios: 8.4 % — ABNORMAL LOW (ref 20.0–50.0)
TIBC: 382.2 ug/dL (ref 250.0–450.0)
Transferrin: 273 mg/dL (ref 212.0–360.0)

## 2022-02-04 LAB — PROTIME-INR
INR: 1 ratio (ref 0.8–1.0)
Prothrombin Time: 11.2 s (ref 9.6–13.1)

## 2022-02-04 NOTE — Patient Instructions (Signed)
You have been scheduled for a colonoscopy. Please follow written instructions given to you at your visit today.  Please pick up your prep supplies at the pharmacy within the next 1-3 days. If you use inhalers (even only as needed), please bring them with you on the day of your procedure.  Your provider has requested that you go to the basement level for lab work before leaving today. Press "B" on the elevator. The lab is located at the first door on the left as you exit the elevator.  _______________________________________________________  If you are age 26 or older, your body mass index should be between 23-30. Your Body mass index is 35.5 kg/m. If this is out of the aforementioned range listed, please consider follow up with your Primary Care Provider.  If you are age 45 or younger, your body mass index should be between 19-25. Your Body mass index is 35.5 kg/m. If this is out of the aformentioned range listed, please consider follow up with your Primary Care Provider.   ________________________________________________________  The [place] [facility] providers would like to encourage you to use [facility] to communicate with providers for non-urgent requests or questions.  Due to long hold times on the telephone, sending your provider a message by [facility] may be a faster and more efficient way to get a response.  Please allow 48 business hours for a response.  Please remember that this is for non-urgent requests.  _______________________________________________________ Due to recent changes in healthcare laws, you may see the results of your imaging and laboratory studies on MyChart before your provider has had a chance to review them.  We understand that in some cases there may be results that are confusing or concerning to you. Not all laboratory results come back in the same time frame and the provider may be waiting for multiple results in order to interpret others.  Please give [place] 48 hours in order  for your provider to thoroughly review all the results before contacting the office for clarification of your results.

## 2022-02-04 NOTE — Telephone Encounter (Signed)
Request for surgical clearance:     Endoscopy Procedure  What type of surgery is being performed?     endoscopy  When is this surgery scheduled?     03/12/22  What type of clearance is required ?   Pharmacy  Are there any medications that need to be held prior to surgery and how long? Plavix, 5 days  Practice name and name of physician performing surgery?      [facility]  What is your office phone and fax number?      Phone- [phone]  [phone]  Anesthesia type (None, local, MAC, general) ?       MAC

## 2022-02-04 NOTE — Progress Notes (Signed)
Chief Complaint: History of cirrhosis, low hemoglobin  HPI:    Colin Lee is a 61 year old male with a past medical history as listed below including CAD on Plavix, COPD, rheumatoid arthritis and cirrhosis, who was referred to me by [name], MD for follow-up of cirrhosis and a "low hemoglobin".    06/16/2017 patient seen in the clinic for new diagnosis of cirrhosis.  At that time had full work-up.    07/23/17 EGD with normal esophagus, multiple gastric polyps and a small hiatal hernia.  Repeat recommended in 3 years for screening.    03/31/2018 patient seen in clinic and was doing well, he needed a colonoscopy.    04/10/2018 colonoscopy with three 5-7 mm polyps in the sigmoid, transverse colon and hepatic flexure.  Mild diverticulosis in the entire colon.  Internal hemorrhoids.  Pathology showed adenomatous polyps.  Repeat recommended in 5 years.      05/28/2021 patient called his pulmonologist to request pickup of his oxygen tank as he did not need it anymore.  At that point it is recommended he have repeated PFTs prior to them taking it away.  It does not look he like he ever had this done.  He no longer uses this at home.    Today, patient presents to clinic and tells me in general he is feeling great.  He was put on Ozempic last year and has lost about 100 pounds so far, due to this has been able to discontinue his Oxygen use and his reflux is gone away.  He is able to move around better and just feels a whole lot better.  Does tell me though that he knows he is due for follow-up of his cirrhosis.  Apparently had labs about 4 months ago with his PCP but we do not have those today.  Was also told that time he had a low hemoglobin.    Denies fever, chills, weight loss, change in bowel habits or abdominal pain.  Past Medical History:  Diagnosis Date   Acute febrile illness 10/20/2019   Allergy    Arthralgia 06/03/2014   Arthritis    Asthma    Bacteremia 10/21/2019   Cellulitis and  abscess of right leg with Bacteremia 10/21/2019   Chest pain 05/31/2014   Chewing difficulty    Cholelithiases 10/26/2013   Claudication ([place]) 04/15/2016   Constipation    COPD (chronic obstructive pulmonary disease) ([place]) 10/21/2019   Coronary artery disease 2016   90% RCA and 30% LM S/P PCI of RCA   Depression    DOE (dyspnea on exertion)    Quit smoking 2002 - Spirometry 03/29/2019  FEV1 2.25 (55%)  Ratio 0.74 with no resp to saba p ? Prior    Onset June 2020 with cards w/u neg 02/2019 and assoc with atypical cp on prn ppi - 03/29/2019   Walked RA x two laps =  approx 538f @ fast pace - stopped due to end of study with sats of 92% at the end of the study and mild sob  - 03/29/2019 max rx for GERD      Elevated troponin    Fatty liver    GERD (gastroesophageal reflux disease)    Headache    stress and tension   History of nuclear stress test    Myoview 1/19: EF 61, no ischemia, low risk   Hyperlipidemia    Hypertension    Joint pain    Melanoma ([place] 11/24/2019   Right upper back. Superficial  spreading. Breslow's 0.27m. Clark's II   Morbid obesity due to excess calories ([place] 04/01/2019   Baseline wt around 250 when quit smoking 2002    Neuromuscular disorder ([place]    restless legs and leg cramps   Normocytic anemia 06/03/2014   Other fatigue    Rash and nonspecific skin eruption    Restless leg syndrome 12/28/2013   Rheumatoid arthritis ([place] 06/2019   Rheumatoid arthritis ([place]    Shortness of breath    Starting May 2015   Shortness of breath on exertion    Stented coronary artery    Swallowing difficulty    TEN (toxic epidermal necrolysis) ([place] 11/10/2019   Tubular adenoma of colon 03/2013    Past Surgical History:  Procedure Laterality Date   CHEST TUBE INSERTION Left 02/01/2014   Procedure: INSERTION PLEURAL DRAINAGE CATHETER;  Surgeon: EGrace Isaac MD;  [facility]  Service: Thoracic;  Laterality: Left;   COLONOSCOPY     COLONOSCOPY W/ BIOPSIES AND  POLYPECTOMY  2014   benign   LEFT HEART CATHETERIZATION WITH CORONARY ANGIOGRAM N/A 06/07/2014   Procedure: LEFT HEART CATHETERIZATION WITH CORONARY ANGIOGRAM;  Surgeon: Peter M JMartinique MD;  [facility];  Service: Cardiovascular;  Laterality: N/A;   LEFT HEART CATHETERIZATION WITH CORONARY ANGIOGRAM N/A 06/08/2014   Procedure: LEFT HEART CATHETERIZATION WITH CORONARY ANGIOGRAM;  Surgeon: HSinclair Grooms MD;  [facility];  Service: Cardiovascular;  Laterality: N/A;   MASS EXCISION N/A 10/25/2019   Procedure: SKIN BIOPSY;  Surgeon: JAviva Signs MD;  Location: [facility];  Service: General;  Laterality: N/A;   no prior surgery     PLEURAL BIOPSY Left 02/01/2014   Procedure: PLEURAL BIOPSY;  Surgeon: EGrace Isaac MD;  [facility]  Service: Thoracic;  Laterality: Left;   PLEURAL EFFUSION DRAINAGE Left 02/01/2014   Procedure: DRAINAGE OF PLEURAL EFFUSION;  Surgeon: EGrace Isaac MD;  [facility]  Service: Thoracic;  Laterality: Left;   REMOVAL OF PLEURAL DRAINAGE CATHETER Left 02/16/2014   Procedure: REMOVAL OF PLEURAL DRAINAGE CATHETER;  Surgeon: EGrace Isaac MD;  [facility]  Service: Thoracic;  Laterality: Left;   RIGHT/LEFT HEART CATH AND CORONARY ANGIOGRAPHY N/A 05/16/2020   Procedure: RIGHT/LEFT HEART CATH AND CORONARY ANGIOGRAPHY;  Surgeon: KTroy Sine MD;  [facility];  Service: Cardiovascular;  Laterality: N/A;   TALC PLEURODESIS Left 02/01/2014   Procedure: TPietro Cassis  Surgeon: EGrace Isaac MD;  [facility]  Service: Thoracic;  Laterality: Left;   THORACENTESIS Left 2015   VIDEO ASSISTED THORACOSCOPY Left 02/01/2014   Procedure: VIDEO ASSISTED THORACOSCOPY;  Surgeon: EGrace Isaac MD;  [place]  Service: Thoracic;  Laterality: Left;   VIDEO BRONCHOSCOPY N/A 02/01/2014   Procedure: VIDEO BRONCHOSCOPY;  Surgeon: EGrace Isaac MD;  [facility]: [facility];  Service: Thoracic;  Laterality: N/A;     Current Outpatient Medications  Medication Sig Dispense Refill   acetaminophen (TYLENOL) 500 MG tablet Take 1,000 mg by mouth every 6 (six) hours as needed for moderate pain.     albuterol (VENTOLIN HFA) 108 (90 Base) MCG/ACT inhaler Inhale 1 puff into the lungs every 6 (six) hours as needed for wheezing or shortness of breath. (Patient not taking: Reported on 02/20/2021)     amLODipine (NORVASC) 2.5 MG tablet Take 1 tablet (2.5 mg total) by mouth daily. 90 tablet 3   atorvastatin (LIPITOR) 40 MG tablet One half daily 45  tablet 3   clopidogrel (PLAVIX) 75 MG tablet Take 1 tablet (75 mg total) by mouth daily. 90 tablet 3   famotidine (PEPCID) 20 MG tablet One after supper     Fluticasone-Umeclidin-Vilant (TRELEGY ELLIPTA) 100-62.5-25 MCG/INH AEPB Inhale 100 fluid ounces into the lungs daily at 12 noon.     losartan (COZAAR) 100 MG tablet Take 1 tablet (100 mg total) by mouth daily. 90 tablet 3   metoprolol tartrate (LOPRESSOR) 25 MG tablet Take 0.5 tablets (12.5 mg total) by mouth 2 (two) times daily. 90 tablet 3   Multiple Vitamins-Minerals (CENTRUM ADULTS PO) Take 1 tablet by mouth daily.     mycophenolate (CELLCEPT) 500 MG tablet Take 500 mg by mouth 2 (two) times daily.     nitroGLYCERIN (NITROSTAT) 0.4 MG SL tablet Place 1 tablet (0.4 mg total) under the tongue every 5 (five) minutesas needed for chest pain. 25 tablet 6   Omega-3 Fatty Acids (FISH OIL) 1000 MG CPDR Take 4,000 mg by mouth daily.     OXYGEN Inhale 3 L into the lungs as needed. O2 at 3L as needed for shortness of breath.     pantoprazole (PROTONIX) 40 MG tablet TAKE 1 TABLET BY MOUTH ONCE DAILY. 90 tablet 2   predniSONE (DELTASONE) 5 MG tablet Take 15 mg by mouth daily with breakfast. Patient stated he takes two of the 5 mg tablets in the morning     testosterone cypionate (DEPOTESTOSTERONE CYPIONATE) 200 MG/ML injection Inject 200 mg into the muscle every 30 (thirty) days.     Vitamin D, Ergocalciferol, (DRISDOL) 1.25 MG  (50000 UNIT) CAPS capsule Take 1 capsule (50,000 Units total) by mouth every 7 (seven) days. 4 capsule 0   No current facility-administered medications for this visit.    Allergies as of 02/04/2022 - Review Complete 02/20/2021  Allergen Reaction Noted   Hydroxychloroquine Dermatitis 11/18/2019    Family History  Adopted: Yes  Problem Relation Age of Onset   Colon cancer Neg Hx    Esophageal cancer Neg Hx    Rectal cancer Neg Hx    Stomach cancer Neg Hx     Social History   Socioeconomic History   Marital status: Married    Spouse name: Sherri   Number of children: Not on file   Years of education: Not on file   Highest education level: Not on file  Occupational History   Occupation: Facility Maintenance  Tobacco Use   Smoking status: Former    Packs/day: 1.00    Years: 30.00    Total pack years: 30.00    Types: Cigarettes    Quit date: 04/08/2000    Years since quitting: 21.8   Smokeless tobacco: Never  Vaping Use   Vaping Use: Never used  Substance and Sexual Activity   Alcohol use: Yes   Drug use: No   Sexual activity: Yes    Partners: Female  Other Topics Concern   Not on file  Social History Narrative   Not on file   Social Determinants of Health   Financial Resource Strain: Not on file  Food Insecurity: Not on file  Transportation Needs: Not on file  Physical Activity: Not on file  Stress: Not on file  Social Connections: Not on file  Intimate Partner Violence: Not on file    Review of Systems:    Constitutional: No fever or chills Skin: No rash or itching Cardiovascular: No chest pain Respiratory: No SOB  Gastrointestinal: See HPI and otherwise negative Genitourinary:  No dysuria  Neurological: No headache, dizziness or syncope Musculoskeletal: No new muscle or joint pain Hematologic: No bleeding  Psychiatric: No history of depression or anxiety   Physical Exam:  Vital signs: BP 122/60   Pulse 68   Ht '5\' 10"'$  (1.778 m)   Wt 247 lb 6.4 oz  (112.2 kg)   BMI 35.50 kg/m    Constitutional:   Pleasant overweight Caucasian male appears to be in NAD, Well developed, Well nourished, alert and cooperative Head:  Normocephalic and atraumatic. Eyes:   PEERL, EOMI. No icterus. Conjunctiva pink. Ears:  Normal auditory acuity. Neck:  Supple Throat: Oral cavity and pharynx without inflammation, swelling or lesion.  Respiratory: Respirations even and unlabored. Lungs clear to auscultation bilaterally.   No wheezes, crackles, or rhonchi.  Cardiovascular: Normal S1, S2. No MRG. Regular rate and rhythm. No peripheral edema, cyanosis or pallor.  Gastrointestinal:  Soft, nondistended, nontender. No rebound or guarding. Normal bowel sounds. No appreciable masses or hepatomegaly. Rectal:  Not performed.  Msk:  Symmetrical without gross deformities. Without edema, no deformity or joint abnormality.  Neurologic:  Alert and  oriented x4;  grossly normal neurologically.  Skin:   Dry and intact without significant lesions or rashes. Psychiatric: Demonstrates good judgement and reason without abnormal affect or behaviors.  Requesting most recent labs  Assessment: 1.  History of nonalcoholic cirrhosis: Seems to be compensated, requesting most recent labs and will recheck some here, due for repeat variceal screening EGD 2.  History of adenomatous polyps: Due for repeat colonoscopy in 2025 3.  History of pulmonary for fibrosis and oxygen use: Discontinued oxygen earlier this year after losing a lot of weight, does not look like he has followed with pulmonology though 4.  CAD on Plavix  5.  Low hemoglobin: Reported by the patient per his PCP though we do not have labs  Plan: 1.  Patient will be scheduled for EGD for variceal screening given history of cirrhosis with Dr. Fuller Plan in the [facility].  Did provide the patient a detailed list of risks for the procedure and he agrees to proceed.  I believe patient is okay to proceed here in the [facility] given that he is not  on oxygen anymore, but apparently after reading through notes this was not approved by his pulmonologist.  He may need to follow-up with them prior to coming in for this procedure.  We will contact him in regards to pulmonary clearance and see what they have to say. 2.  Patient told to hold his Plavix for 5 days prior to time of procedure.  We will communicate with his prescribed physician to ensure this is acceptable for him. 3.  Recheck labs today including CBC, CMP, PT/INR and iron studies 4.  Patient to follow in clinic after labs and imaging above.  Ellouise Newer, PA-C [facility] 02/04/2022, 10:26 AM  Cc: [name], MD   Addendum: Received labs after patient visit.  11/29/21 hemoglobin A1c 5.3, CMP with an elevated glucose at 124 and otherwise normal, CBC with hemoglobin of 11.5, MCV 81 (minimally decreased), normal platelets and otherwise normal.  Continue as planned for now.  Ellouise Newer, PA-C

## 2022-02-04 NOTE — Telephone Encounter (Signed)
Letter faxed manually and electronically to Dr Wallene Huh with [facility] pulmonology. Will await response.

## 2022-02-06 NOTE — Telephone Encounter (Signed)
   Name: Colin Lee  DOB: 02/08/1961  MRN: [id number]  Primary Cardiologist: Fransico Him, MD  Chart reviewed as part of pre-operative protocol coverage. Because of Colin Lee's past medical history and time since last visit, he will require a follow-up in-office visit in order to better assess preoperative cardiovascular risk.  Pre-op covering staff: - Please schedule appointment and call patient to inform them. If patient already had an upcoming appointment within acceptable timeframe, please add "pre-op clearance" to the appointment notes so provider is aware. - Please contact requesting surgeon's office via preferred method (i.e, phone, fax) to inform them of need for appointment prior to surgery.  > 1 year since DES placed. According to office protocol, okay to hold Plavix 5 days prior to procedure.  Will need in office visit as last seen 1 year ago.  Finis Bud, NP  02/06/2022, 12:13 PM

## 2022-02-06 NOTE — Telephone Encounter (Signed)
Pt has been scheduled to see Ambrose Pancoast , NP 02/21/22 @ 1:55 in office appt. Pt thanked me for the help.

## 2022-02-20 NOTE — Progress Notes (Unsigned)
Office Visit    Patient Name: Colin Lee Date of Encounter: 02/20/2022  Primary Care Provider:  [name], MD Primary Cardiologist:  Fransico Him, MD Primary Electrophysiologist: None  Chief Complaint    Colin Lee is a 61 y.o. male with PMH of CAD s/p DES x2 to RCA in 2016, HTN, HLD, pleural effusion s/p talc pleurodesis, COPD who presents today for preoperative clearance for endoscopic procedure.  Past Medical History    Past Medical History:  Diagnosis Date   Acute febrile illness 10/20/2019   Allergy    Arthralgia 06/03/2014   Arthritis    Asthma    Bacteremia 10/21/2019   Cellulitis and abscess of right leg with Bacteremia 10/21/2019   Chest pain 05/31/2014   Chewing difficulty    Cholelithiases 10/26/2013   Claudication ([place]) 04/15/2016   Constipation    COPD (chronic obstructive pulmonary disease) ([place]) 10/21/2019   Coronary artery disease 2016   90% RCA and 30% LM S/P PCI of RCA   Depression    DOE (dyspnea on exertion)    Quit smoking 2002 - Spirometry 03/29/2019  FEV1 2.25 (55%)  Ratio 0.74 with no resp to saba p ? Prior    Onset June 2020 with cards w/u neg 02/2019 and assoc with atypical cp on prn ppi - 03/29/2019   Walked RA x two laps =  approx 572f @ fast pace - stopped due to end of study with sats of 92% at the end of the study and mild sob  - 03/29/2019 max rx for GERD      Elevated troponin    Fatty liver    GERD (gastroesophageal reflux disease)    Headache    stress and tension   History of nuclear stress test    Myoview 1/19: EF 61, no ischemia, low risk   Hyperlipidemia    Hypertension    Joint pain    Melanoma ([place] 11/24/2019   Right upper back. Superficial spreading. 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   Tubular adenoma of colon 03/2013   Past Surgical History:  Procedure Laterality Date   CHEST TUBE INSERTION Left 02/01/2014   Procedure: INSERTION PLEURAL DRAINAGE CATHETER;  Surgeon: EdGrace IsaacMD;  [place] [facility] Service: Thoracic;  Laterality: Left;   COLONOSCOPY     COLONOSCOPY W/ BIOPSIES AND POLYPECTOMY  2014   benign   LEFT HEART CATHETERIZATION WITH CORONARY ANGIOGRAM N/A 06/07/2014   Procedure: LEFT HEART CATHETERIZATION WITH CORONARY ANGIOGRAM;  Surgeon: Peter M JoMartiniqueMD;  [facility];  Service: Cardiovascular;  Laterality: N/A;   LEFT HEART CATHETERIZATION WITH CORONARY ANGIOGRAM N/A 06/08/2014   Procedure: LEFT HEART CATHETERIZATION WITH CORONARY ANGIOGRAM;  Surgeon: HeSinclair GroomsMD;  [facility];  Service: Cardiovascular;  Laterality: N/A;   MASS EXCISION  N/A 10/25/2019   Procedure: SKIN BIOPSY;  Surgeon: Aviva Signs, MD;  Location: [facility]S;  Service: General;  Laterality: N/A;   no prior surgery     PLEURAL BIOPSY Left 02/01/2014   Procedure: PLEURAL BIOPSY;  Surgeon: Grace Isaac, MD;  Location: [place];  Service: Thoracic;  Laterality: Left;   PLEURAL EFFUSION DRAINAGE Left 02/01/2014   Procedure: DRAINAGE OF PLEURAL EFFUSION;  Surgeon: Grace Isaac, MD;  Location: [place];  Service: Thoracic;  Laterality: Left;   REMOVAL OF PLEURAL DRAINAGE CATHETER Left 02/16/2014   Procedure: REMOVAL OF PLEURAL DRAINAGE CATHETER;  Surgeon: Grace Isaac, MD;  Location: [place];  Service: Thoracic;  Laterality: Left;   RIGHT/LEFT HEART CATH AND CORONARY ANGIOGRAPHY N/A 05/16/2020   Procedure: RIGHT/LEFT  HEART CATH AND CORONARY ANGIOGRAPHY;  Surgeon: Troy Sine, MD;  Location: [place] CV LAB;  Service: Cardiovascular;  Laterality: N/A;   TALC PLEURODESIS Left 02/01/2014   Procedure: Pietro Cassis;  Surgeon: Grace Isaac, MD;  Location: [place];  Service: Thoracic;  Laterality: Left;   THORACENTESIS Left 2015   VIDEO ASSISTED THORACOSCOPY Left 02/01/2014   Procedure: VIDEO ASSISTED THORACOSCOPY;  Surgeon: Grace Isaac, MD;  Location: [place];  Service: Thoracic;  Laterality: Left;   VIDEO BRONCHOSCOPY N/A 02/01/2014   Procedure: VIDEO BRONCHOSCOPY;  Surgeon: Grace Isaac, MD;  Location: [facility] OR;  Service: Thoracic;  Laterality: N/A;    Allergies  Allergies  Allergen Reactions   Hydroxychloroquine Dermatitis    Skin Rash    History of Present Illness    Colin Lee  is a 61 year old male with the above mention past medical history who presents today for preoperative clearance for upcoming endoscopic procedure.  He was initially seen by Dr. Radford Pax in 2016 for complaint of chest pain.  He described pain as substernal and evaluation for possible ischemia with Lexiscan Myoview that showed no ischemia.  Coronary CT scan was also completed that showed possible high-grade stenosis.  He underwent LHC that showed high-grade stenosis and RCA that was treated with DES x2.  Nuclear stress test was repeated for chest pain and shortness of breath that showed no ischemia.  Re look cath that demonstrated patent stents and moderate pleural effusion secondary to community-acquired pneumonia.  He was seen on 05/2020 for complaint of chest pain and dyspnea on exertion.  R/LHC was performed and revealed mild right heart pressures with mild pulmonary hypertension and no significant CAD.  Medical therapy was recommended for diastolic dysfunction.  He was last seen by Dr. Radford Pax on 02/2021 for follow-up.  He reportedly was doing well with no new cardiac complaints.  Colin Lee presents  today for preoperative clearance alone.  Since last being seen in the office patient reports that he is not experiencing any cardiac complaints or chest pain.  He is compliant with his current medication regimen and denies any adverse reactions or side effects.  He is currently followed by pulmonologist for chronic shortness of breath related to pulmonary fibrosis.  Blood pressure today was well controlled at 130/82 rate was 63 bpm.  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  Patient denies chest pain, palpitations, dyspnea, PND, orthopnea, nausea, vomiting, dizziness, syncope, edema, weight gain, or early satiety.   Home Medications    Current Outpatient Medications  Medication Sig Dispense Refill   acetaminophen (TYLENOL) 500 MG tablet Take 1,000  mg by mouth every 6 (six) hours as needed for moderate pain.     amLODipine (NORVASC) 2.5 MG tablet Take 1 tablet (2.5 mg total) by mouth daily. 90 tablet 3   atorvastatin (LIPITOR) 40 MG tablet One half daily 45 tablet 3   clopidogrel (PLAVIX) 75 MG tablet Take 1 tablet (75 mg total) by mouth daily. 90 tablet 3   famotidine (PEPCID) 20 MG tablet One after supper     Fluticasone-Umeclidin-Vilant (TRELEGY ELLIPTA) 100-62.5-25 MCG/INH AEPB Inhale 100 fluid ounces into the lungs daily at 12 noon.     losartan (COZAAR) 100 MG tablet Take 1 tablet (100 mg total) by mouth daily. 90 tablet 3   metoprolol tartrate (LOPRESSOR) 25 MG tablet Take 0.5 tablets (12.5 mg total) by mouth 2 (two) times daily. 90 tablet 3   Multiple Vitamins-Minerals (CENTRUM ADULTS PO) Take 1 tablet by mouth daily.     mycophenolate (CELLCEPT) 500 MG tablet Take 500 mg by mouth 2 (two) times daily.     Omega-3 Fatty Acids (FISH OIL) 1000 MG CPDR Take 4,000 mg by mouth daily.     pantoprazole (PROTONIX) 40 MG tablet TAKE 1 TABLET BY MOUTH ONCE DAILY. 90 tablet 2   testosterone cypionate (DEPOTESTOSTERONE  CYPIONATE) 200 MG/ML injection Inject 200 mg into the muscle every 30 (thirty) days.     Vitamin D, Ergocalciferol, (DRISDOL) 1.25 MG (50000 UNIT) CAPS capsule Take 1 capsule (50,000 Units total) by mouth every 7 (seven) days. 4 capsule 0   No current facility-administered medications for this visit.     Review of Systems  Please see the history of present illness.    (+) Chronic shortness of breath (+) Arthritic pain  All other systems reviewed and are otherwise negative except as noted above.  Physical Exam    Wt Readings from Last 3 Encounters:  02/04/22 247 lb 6.4 oz (112.2 kg)  02/20/21 283 lb (128.4 kg)  02/09/21 282 lb (127.9 kg)   PJ:KDTOI were no vitals filed for this visit.,There is no height or weight on file to calculate BMI.  Constitutional:      Appearance: Healthy appearance. Not in distress.  Neck:     Vascular: JVD normal.  Pulmonary:     Effort: Pulmonary effort is normal.     Breath sounds: No wheezing. No rales. Diminished in the bases Cardiovascular:     Normal rate. Regular rhythm. Normal S1. Normal S2.      Murmurs: There is no murmur.  Edema:    Peripheral edema absent.  Abdominal:     Palpations: Abdomen is soft non tender. There is no hepatomegaly.  Skin:    General: Skin is warm and dry.  Neurological:     General: No focal deficit present.     Mental Status: Alert and oriented to person, place and time.     Cranial Nerves: Cranial nerves are intact.  EKG/LABS/Other Studies Reviewed    ECG personally reviewed by me today -sinus rhythm with rate of 63 bpm and no acute changes consistent with previous EKG.   Lab Results  Component Value Date   WBC 7.7 02/04/2022   HGB 11.7 (L) 02/04/2022   HCT 35.4 (L) 02/04/2022   MCV 82.4 02/04/2022   PLT 237.0 02/04/2022   Lab Results  Component Value Date   CREATININE 0.96 02/04/2022   BUN 15 02/04/2022   NA 141 02/04/2022   K 4.0 02/04/2022   CL 106 02/04/2022   CO2 27 02/04/2022  Lab  Results  Component Value Date   ALT 14 02/04/2022   AST 15 02/04/2022   ALKPHOS 58 02/04/2022   BILITOT 0.4 02/04/2022   Lab Results  Component Value Date   CHOL 128 02/28/2021   HDL 41 02/28/2021   LDLCALC 70 02/28/2021   TRIG 85 02/28/2021   CHOLHDL 3.1 02/28/2021    Lab Results  Component Value Date   HGBA1C 6.5 (H) 05/23/2020    Assessment & Plan    1.  Preoperative clearance: -The patient affirms he has been doing well without any new cardiac symptoms. They are able to achieve 5 METS without cardiac limitations. Therefore, based on ACC/AHA guidelines, the patient would be at acceptable risk for the planned procedure without further cardiovascular testing. The patient was advised that if he develops new symptoms prior to surgery to contact our office to arrange for a follow-up visit, and he verbalized understanding.   Colin Lee perioperative risk of a major cardiac event is 6.6% according to the Revised Cardiac Risk Index (RCRI).  Therefore, he is at low risk for perioperative complications.   His functional capacity is fair at 5.38 METs according to the [facility] Activity Status Index (DASI). Recommendations: According to ACC/AHA guidelines, no further cardiovascular testing needed.  The patient may proceed to surgery at acceptable risk.   Antiplatelet and/or Anticoagulation Recommendations: Clopidogrel (Plavix) can be held for 5 days prior to his surgery and resumed as soon as possible post op.  2.  Coronary artery disease: -s/p DES x2 to proximal RCA with high-grade stenosis in 2016 -Today patient reports no chest pain or anginal equivalent -Continue GDMT with Plavix 75 mg, Lopressor 12.5 mg twice daily and atorvastatin 40 mg daily  3.  Essential hypertension: -Patient's blood pressure today was well controlled at 130/82 -Continue Lopressor 12.5 mg and losartan 100 mg daily  4.  Hyperlipidemia: -Patient's last LDL was at goal of 70 on 02/2021 -Continue atorvastatin as  noted above  5.  History of COPD: -Continue current treatment plan per pulmonology   Disposition: Follow-up with Fransico Him, MD or APP in 12 months    Medication Adjustments/Labs and Tests Ordered: Current medicines are reviewed at length with the patient today.  Concerns regarding medicines are outlined above.   Signed, Mable Fill, Marissa Nestle, NP 02/20/2022, 12:56 PM  [facility]  Note:  This document was prepared using Dragon voice recognition software and may include unintentional dictation errors.

## 2022-02-21 ENCOUNTER — Encounter: Payer: Self-pay | Admitting: Nurse Practitioner

## 2022-02-21 ENCOUNTER — Ambulatory Visit: Payer: BC Managed Care – PPO | Attending: Nurse Practitioner | Admitting: Nurse Practitioner

## 2022-02-21 VITALS — BP 130/82 | HR 63 | Ht 69.0 in | Wt 244.0 lb

## 2022-02-21 DIAGNOSIS — J449 Chronic obstructive pulmonary disease, unspecified: Secondary | ICD-10-CM

## 2022-02-21 DIAGNOSIS — I10 Essential (primary) hypertension: Secondary | ICD-10-CM | POA: Diagnosis not present

## 2022-02-21 DIAGNOSIS — I2510 Atherosclerotic heart disease of native coronary artery without angina pectoris: Secondary | ICD-10-CM

## 2022-02-21 DIAGNOSIS — Z01810 Encounter for preprocedural cardiovascular examination: Secondary | ICD-10-CM

## 2022-02-21 DIAGNOSIS — E785 Hyperlipidemia, unspecified: Secondary | ICD-10-CM

## 2022-02-21 NOTE — Patient Instructions (Signed)
Medication Instructions:  Your physician recommends that you continue on your current medications as directed. Please refer to the Current Medication list given to you today. *If you need a refill on your cardiac medications before your next appointment, please call your pharmacy*   Lab Work: None Ordered   Testing/Procedures: None ordered   Follow-Up: At [facility], you and your health needs are our priority.  As part of our continuing mission to provide you with exceptional heart care, we have created designated Provider Care Teams.  These Care Teams include your primary Cardiologist (physician) and Advanced Practice Providers (APPs -  Physician Assistants and Nurse Practitioners) who all work together to provide you with the care you need, when you need it.  We recommend signing up for the patient portal called "MyChart".  Sign up information is provided on this After Visit Summary.  MyChart is used to connect with patients for Virtual Visits (Telemedicine).  Patients are able to view lab/test results, encounter notes, upcoming appointments, etc.  Non-urgent messages can be sent to your provider as well.   To learn more about what you can do with MyChart, go to [email or web address].    Your next appointment:   12 month(s)  The format for your next appointment:   In Person  Provider:   Fransico Him, MD     Other Instructions   Important Information About Sugar

## 2022-02-22 ENCOUNTER — Telehealth: Payer: Self-pay | Admitting: *Deleted

## 2022-02-22 MED ORDER — PLENVU 140 G PO SOLR: 1.0000 | ORAL | 0 refills | Status: DC

## 2022-02-22 NOTE — Addendum Note (Signed)
Addended by: Larina Bras on: 02/22/2022 09:14 AM   Modules accepted: Orders

## 2022-02-22 NOTE — Telephone Encounter (Signed)
-----   Message from Osvaldo Angst, CRNA sent at 02/22/2022 11:16 AM EST ----- [name] Server,  This pt is cleared for anesthetic care at [facility].  Thanks,  Osvaldo Angst ----- Message ----- From: Larina Bras, CMA Sent: 02/22/2022   9:18 AM EST To: Osvaldo Angst, CRNA  Good morning- Anderson Malta wanted me to send you a note about this patient having [place] procedure on 03/12/22. He will be getting pulmonary clearance on 12/4 (REALLY close to the date of procedure!). He was apparently on oxygen following covid but has not been on it in quite sometime....  ----- Message ----- From: Larina Bras, CMA Sent: 02/22/2022  12:00 AM EST To: Larina Bras, CMA   ----- Message ----- From: Larina Bras, CMA Sent: 02/11/2022  12:00 AM EST To: Larina Bras, CMA  Did we get pulmonology clearance for pt to have ecl at [facility]? ([facility]mology dr [email or web address]).  We will need to send a note to Osvaldo Angst about doing [facility] procedure as well.  Will need to instruct patient on colonoscopy (was originally only scheduled for endo). Will need to do new amb referral to gastro for endo/colon

## 2022-02-22 NOTE — Telephone Encounter (Signed)
Per Cardiology office note dated 02/06/22:  Recommendations: According to ACC/AHA guidelines, no further cardiovascular testing needed.  The patient may proceed to surgery at acceptable risk.   Antiplatelet and/or Anticoagulation Recommendations: Clopidogrel (Plavix) can be held for 5 days prior to his surgery and resumed as soon as possible post op.       I have spoken to the patient to advise that per cardiology, he may hold plavix 5 days prior to his upcoming procedure. We do still need him to follow with pulmonology in order to be cleared for procedure from their standpoint. Patient states that he has an upcoming appointment with them on 03/11/22 for this. In addition, I have created updated instructions for patient endoscopy/colonoscopy (patient originally was only scheduled for endoscopy before being found to be anemic) and have made them available via mychart. These have also been mailed to the patient.

## 2022-03-01 ENCOUNTER — Other Ambulatory Visit: Payer: Self-pay | Admitting: Cardiology

## 2022-03-04 DIAGNOSIS — U071 COVID-19: Secondary | ICD-10-CM | POA: Diagnosis not present

## 2022-03-04 NOTE — Telephone Encounter (Signed)
Patient canceled procedure due to having Covid. Patient will call back to schedule after he meet with the cardiologist

## 2022-03-12 ENCOUNTER — Encounter: Payer: BC Managed Care – PPO | Admitting: Gastroenterology

## 2022-03-22 ENCOUNTER — Other Ambulatory Visit: Payer: Self-pay | Admitting: Cardiology

## 2022-03-22 NOTE — Telephone Encounter (Signed)
We have received a fax from [facility]) Pulmonology indicating the following: "Patient name: Colin Lee Patient Date of Birth: 07-13-1960 Todays Date: 03/22/2022  To Whom It May Concern:  This is to certify that [name] (12-07-1960) was seen by Dr Raul Del on 03/22/2022 and has been cleared pulmonary wise for endoscopy and colonoscopy procedures. Please call our office if you have any questions.  Sincerely,   Erby Pian, MD"  As of the time of this writing, patient cancelled his colonoscopy due to COVID and said he would call back to reschedule. Will keep clearance available for when he reschedules.

## 2022-04-16 ENCOUNTER — Telehealth: Payer: Self-pay | Admitting: Physician Assistant

## 2022-04-16 ENCOUNTER — Other Ambulatory Visit: Payer: Self-pay

## 2022-04-16 MED ORDER — NA SULFATE-K SULFATE-MG SULF 17.5-3.13-1.6 GM/177ML PO SOLN: 1.0000 | Freq: Once | ORAL | 0 refills | Status: AC

## 2022-04-16 NOTE — Telephone Encounter (Signed)
[place] to see what the preferred was and the stated suprep was one of the options. Suprep was sent to patients pharmacy.

## 2022-04-16 NOTE — Telephone Encounter (Signed)
Called patient and let patient know that prep instructions were on Mychart. Patient confirmed that he could see prep instructions.

## 2022-04-16 NOTE — Telephone Encounter (Signed)
Inbound call from patient stating that he is scheduled to have colonoscopy on 1/11 with Dr. Fuller Plan and stated that he went to get his prep yesterday and his insurance does not cover the prep. Patient is requesting a new prep be sent to pharmacy and requesting a call to be advised when it has been sent. Please advise.

## 2022-04-18 ENCOUNTER — Ambulatory Visit (AMBULATORY_SURGERY_CENTER): Payer: BC Managed Care – PPO | Admitting: Gastroenterology

## 2022-04-18 ENCOUNTER — Encounter: Payer: Self-pay | Admitting: Gastroenterology

## 2022-04-18 VITALS — BP 150/86 | HR 78 | Temp 98.7°F | Resp 14 | Ht 70.0 in | Wt 247.0 lb

## 2022-04-18 DIAGNOSIS — Z86010 Personal history of colonic polyps: Secondary | ICD-10-CM | POA: Diagnosis not present

## 2022-04-18 DIAGNOSIS — D123 Benign neoplasm of transverse colon: Secondary | ICD-10-CM | POA: Diagnosis not present

## 2022-04-18 DIAGNOSIS — D509 Iron deficiency anemia, unspecified: Secondary | ICD-10-CM

## 2022-04-18 DIAGNOSIS — K3189 Other diseases of stomach and duodenum: Secondary | ICD-10-CM | POA: Diagnosis not present

## 2022-04-18 DIAGNOSIS — Z09 Encounter for follow-up examination after completed treatment for conditions other than malignant neoplasm: Secondary | ICD-10-CM | POA: Diagnosis present

## 2022-04-18 DIAGNOSIS — Z8719 Personal history of other diseases of the digestive system: Secondary | ICD-10-CM

## 2022-04-18 MED ORDER — SODIUM CHLORIDE 0.9 % IV SOLN: 500.0000 mL | INTRAVENOUS | Status: DC

## 2022-04-18 NOTE — Progress Notes (Signed)
History & Physical  Primary Care Physician:  [name], MD Primary Gastroenterologist: Lucio Edward, MD  CHIEF COMPLAINT: Cirrhosis - screening for varices, Personal history of colon polyps, IDA  HPI: Colin Lee is a 62 y.o. male with a personal history of adenomatous colon polyps, IDA, cirrhosis screen for varices for colonoscopy and EGD.  Plavix held for 5 days prior to procedure.   Past Medical History:  Diagnosis Date   Acute febrile illness 10/20/2019   Allergy    Arthralgia 06/03/2014   Arthritis    Asthma    Bacteremia 10/21/2019   Cellulitis and abscess of right leg with Bacteremia 10/21/2019   Chest pain 05/31/2014   Chewing difficulty    Cholelithiases 10/26/2013   Claudication ([place]) 04/15/2016   Constipation    COPD (chronic obstructive pulmonary disease) ([place]) 10/21/2019   Coronary artery disease 2016   90% RCA and 30% LM S/P PCI of RCA   Depression    DOE (dyspnea on exertion)    Quit smoking 2002 - Spirometry 03/29/2019  FEV1 2.25 (55%)  Ratio 0.74 with no resp to saba p ? Prior    Onset June 2020 with cards w/u neg 02/2019 and assoc with atypical cp on prn ppi - 03/29/2019   Walked RA x two laps =  approx 549f @ fast pace - stopped due to end of study with sats of 92% at the end of the study and mild sob  - 03/29/2019 max rx for GERD      Elevated troponin    Fatty liver    GERD (gastroesophageal reflux disease)    Headache    stress and tension   History of nuclear stress test    Myoview 1/19: EF 61, no ischemia, low risk   Hyperlipidemia    Hypertension    Joint pain    Melanoma ([facility] 11/24/2019   Right upper back. Superficial spreading. 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   Tubular adenoma of colon 03/2013    Past Surgical History:  Procedure Laterality Date   CHEST TUBE INSERTION Left 02/01/2014   Procedure: INSERTION PLEURAL DRAINAGE CATHETER;  Surgeon: EdGrace IsaacMD;  [facility] Service: Thoracic;  Laterality: Left;   COLONOSCOPY     COLONOSCOPY W/ BIOPSIES AND POLYPECTOMY  2014   benign   LEFT HEART CATHETERIZATION WITH CORONARY ANGIOGRAM N/A 06/07/2014   Procedure: LEFT HEART CATHETERIZATION WITH CORONARY ANGIOGRAM;  Surgeon: Peter M JoMartiniqueMD;  [facility];  Service: Cardiovascular;  Laterality: N/A;   LEFT HEART CATHETERIZATION WITH CORONARY ANGIOGRAM N/A 06/08/2014   Procedure: LEFT HEART CATHETERIZATION WITH CORONARY ANGIOGRAM;  Surgeon: HeSinclair GroomsMD;  [facility];  Service: Cardiovascular;  Laterality: N/A;   MASS EXCISION N/A 10/25/2019   Procedure: SKIN BIOPSY;  Surgeon: JeAviva SignsMD;  Location: [facility];  Service: General;  Laterality: N/A;   no prior surgery     PLEURAL BIOPSY Left 02/01/2014   Procedure: PLEURAL BIOPSY;  Surgeon: Grace Isaac, MD;  Location: [place];  Service: Thoracic;  Laterality: Left;   PLEURAL EFFUSION DRAINAGE Left 02/01/2014   Procedure: DRAINAGE OF PLEURAL EFFUSION;  Surgeon: Grace Isaac, MD;  Location: [place];  Service: Thoracic;  Laterality: Left;   REMOVAL OF PLEURAL DRAINAGE CATHETER Left 02/16/2014   Procedure: REMOVAL OF PLEURAL DRAINAGE CATHETER;  Surgeon: Grace Isaac, MD;  Location: [place];  Service: Thoracic;  Laterality: Left;   RIGHT/LEFT HEART CATH AND CORONARY ANGIOGRAPHY N/A 05/16/2020   Procedure: RIGHT/LEFT HEART CATH AND CORONARY ANGIOGRAPHY;  Surgeon: Troy Sine, MD;  Location: [place] CV LAB;  Service: Cardiovascular;  Laterality: N/A;   TALC PLEURODESIS Left 02/01/2014   Procedure: Pietro Cassis;  Surgeon: Grace Isaac, MD;  Location: [place];  Service: Thoracic;  Laterality: Left;   THORACENTESIS Left 2015   VIDEO ASSISTED THORACOSCOPY Left 02/01/2014   Procedure: VIDEO ASSISTED THORACOSCOPY;  Surgeon: Grace Isaac, MD;  Location: [place];  Service: Thoracic;  Laterality: Left;   VIDEO BRONCHOSCOPY N/A 02/01/2014   Procedure: VIDEO BRONCHOSCOPY;  Surgeon: Grace Isaac, MD;  Location: [place];  Service: Thoracic;  Laterality: N/A;    Prior to Admission medications   Medication Sig Start Date End Date Taking? Authorizing Provider  amLODipine (NORVASC) 2.5 MG tablet Take 1 tablet (2.5 mg total) by mouth daily. 02/22/21  Yes Turner, Eber Hong, MD  atorvastatin (LIPITOR) 40 MG tablet TAKE (1/2) TABLET BY MOUTH ONCE DAILY. 03/22/22  Yes Sueanne Margarita, MD  famotidine (PEPCID) 20 MG tablet One after supper 03/29/19  Yes Tanda Rockers, MD  Fluticasone-Umeclidin-Vilant (TRELEGY ELLIPTA) 100-62.5-25 MCG/INH AEPB Inhale 100 fluid ounces into the lungs daily at 12 noon. 06/12/20  Yes [provider]  losartan (COZAAR) 100 MG tablet Take 1 tablet (100 mg total) by mouth daily. 02/20/21  Yes Turner, Eber Hong, MD  metoprolol tartrate (LOPRESSOR) 25 MG tablet Take 0.5 tablets (12.5 mg total) by mouth 2 (two) times daily. 02/20/21  Yes Turner, Eber Hong, MD  Multiple Vitamins-Minerals (CENTRUM ADULTS PO) Take 1 tablet by mouth daily.   Yes [provider]  mycophenolate (CELLCEPT) 500 MG tablet Take 500 mg by mouth 2 (two) times daily.   Yes [provider]  Omega-3 Fatty Acids (FISH OIL) 1000 MG CPDR Take 4,000 mg by mouth daily. 02/10/15  Yes Turner, Traci R, MD  pantoprazole (PROTONIX) 40 MG tablet TAKE 1 TABLET BY MOUTH ONCE DAILY. 03/04/22  Yes Turner, Eber Hong, MD  clopidogrel (PLAVIX) 75 MG tablet TAKE 1 TABLET BY  MOUTH ONCE DAILY. 03/04/22   Sueanne Margarita, MD  testosterone cypionate (DEPOTESTOSTERONE CYPIONATE) 200 MG/ML injection Inject 200 mg into the muscle every 30 (thirty) days. 10/24/20   [provider]  Vitamin D, Ergocalciferol, (DRISDOL) 1.25 MG (50000 UNIT) CAPS capsule Take 1 capsule (50,000 Units total) by mouth every 7 (seven) days. Patient not taking: Reported on 04/18/2022 09/27/20   Dennard Nip D, MD    Current Outpatient Medications  Medication Sig Dispense Refill   amLODipine (NORVASC) 2.5 MG tablet Take 1 tablet (2.5 mg total) by mouth daily. 90 tablet 3   atorvastatin (LIPITOR) 40 MG tablet TAKE (1/2) TABLET BY MOUTH ONCE DAILY. 45 tablet 3   famotidine (PEPCID) 20 MG tablet One after supper  Fluticasone-Umeclidin-Vilant (TRELEGY ELLIPTA) 100-62.5-25 MCG/INH AEPB Inhale 100 fluid ounces into the lungs daily at 12 noon.     losartan (COZAAR) 100 MG tablet Take 1 tablet (100 mg total) by mouth daily. 90 tablet 3   metoprolol tartrate (LOPRESSOR) 25 MG tablet Take 0.5 tablets (12.5 mg total) by mouth 2 (two) times daily. 90 tablet 3   Multiple Vitamins-Minerals (CENTRUM ADULTS PO) Take 1 tablet by mouth daily.     mycophenolate (CELLCEPT) 500 MG tablet Take 500 mg by mouth 2 (two) times daily.     Omega-3 Fatty Acids (FISH OIL) 1000 MG CPDR Take 4,000 mg by mouth daily.     pantoprazole (PROTONIX) 40 MG tablet TAKE 1 TABLET BY MOUTH ONCE DAILY. 90 tablet 3   clopidogrel (PLAVIX) 75 MG tablet TAKE 1 TABLET BY MOUTH ONCE DAILY. 90 tablet 3   testosterone cypionate (DEPOTESTOSTERONE CYPIONATE) 200 MG/ML injection Inject 200 mg into the muscle every 30 (thirty) days.     Vitamin D, Ergocalciferol, (DRISDOL) 1.25 MG (50000 UNIT) CAPS capsule Take 1 capsule (50,000 Units total) by mouth every 7 (seven) days. (Patient not taking: Reported on 04/18/2022) 4 capsule 0   Current Facility-Administered Medications  Medication Dose Route Frequency Provider Last Rate Last Admin   0.9 %   sodium chloride infusion  500 mL Intravenous Continuous Ladene Artist, MD        Allergies as of 04/18/2022 - Review Complete 04/18/2022  Allergen Reaction Noted   Hydroxychloroquine Dermatitis 11/18/2019    Family History  Adopted: Yes  Problem Relation Age of Onset   Colon cancer Neg Hx    Esophageal cancer Neg Hx    Rectal cancer Neg Hx    Stomach cancer Neg Hx     Social History   Socioeconomic History   Marital status: Married    Spouse name: Sherri   Number of children: Not on file   Years of education: Not on file   Highest education level: Not on file  Occupational History   Occupation: Facility Maintenance  Tobacco Use   Smoking status: Former    Packs/day: 1.00    Years: 30.00    Total pack years: 30.00    Types: Cigarettes    Quit date: 04/08/2000    Years since quitting: 22.0   Smokeless tobacco: Never  Vaping Use   Vaping Use: Never used  Substance and Sexual Activity   Alcohol use: Yes    Comment: rare   Drug use: No   Sexual activity: Yes    Partners: Female  Other Topics Concern   Not on file  Social History Narrative   Not on file   Social Determinants of Health   Financial Resource Strain: Not on file  Food Insecurity: Not on file  Transportation Needs: Not on file  Physical Activity: Not on file  Stress: Not on file  Social Connections: Not on file  Intimate Partner Violence: Not on file    Review of Systems:  All systems reviewed were negative except where noted in HPI.   Physical Exam: General:  Alert, well-developed, in NAD Head:  Normocephalic and atraumatic. Eyes:  Sclera clear, no icterus.   Conjunctiva pink. Ears:  Normal auditory acuity. Mouth:  No deformity or lesions.  Neck:  Supple; no masses . Lungs:  Clear throughout to auscultation.   No wheezes, crackles, or rhonchi. No acute distress. Heart:  Regular rate and rhythm; no murmurs. Abdomen:  Soft, nondistended, nontender. No masses, hepatomegaly. No obvious  masses.  Normal bowel .    Rectal:  Deferred   Msk:  Symmetrical without gross deformities.. Pulses:  Normal pulses noted. Extremities:  Without edema. Neurologic:  Alert and  oriented x4;  grossly normal neurologically. Skin:  Intact without significant lesions or rashes. Psych:  Alert and cooperative. Normal mood and affect.   Impression / Plan:   Personal history of adenomatous colon polyps, IDA, cirrhosis screen for varices for colonoscopy and EGD.  Plavix held for 5 days prior to procedure.  Pricilla Riffle. Fuller Plan  04/18/2022, 3:00 PM See Shea Evans,  GI, to contact our on call provider

## 2022-04-18 NOTE — Progress Notes (Addendum)
Called to room to assist during endoscopic procedure.  Patient ID and intended procedure confirmed with present staff. Received instructions for my participation in the procedure from the performing physician.  

## 2022-04-18 NOTE — Op Note (Addendum)
Colin Lee: Colin Lee Procedure Date: 04/18/2022 3:01 PM MRN: [id number] Endoscopist: Ladene Artist , MD, [id number] Age: 62 Referring MD:  Date of Birth: 1960/12/26 Gender: Male Account #: [id number] Procedure:                Upper GI endoscopy Indications:              Unexplained iron deficiency anemia, Cirrhosis -                            screening for varices Medicines:                Monitored Anesthesia Care Procedure:                Pre-Anesthesia Assessment:                           - Prior to the procedure, a History and Physical                            was performed, and patient medications and                            allergies were reviewed. The patient's tolerance of                            previous anesthesia was also reviewed. The risks                            and benefits of the procedure and the sedation                            options and risks were discussed with the patient.                            All questions were answered, and informed consent                            was obtained. Prior Anticoagulants: The patient has                            taken Plavix (clopidogrel), last dose was 5 days                            prior to procedure. ASA Grade Assessment: III - A                            patient with severe systemic disease. After                            reviewing the risks and benefits, the patient was                            deemed in satisfactory condition to undergo the  procedure.                           After obtaining informed consent, the endoscope was                            passed under direct vision. Throughout the                            procedure, the patient's blood pressure, pulse, and                            oxygen saturations were monitored continuously. The                            GIF [id number] was introduced through the                             mouth, and advanced to the second part of duodenum.                            The upper GI endoscopy was accomplished without                            difficulty. The patient tolerated the procedure                            well. Scope In: 3:24:52 PM Scope Out: 3:32:18 PM Total Procedure Duration: 0 hours 7 minutes 26 seconds  Findings:                 The examined esophagus was normal.                           Localized mildly erythematous mucosa without                            bleeding was found on the greater curvature of the                            stomach. Biopsies were taken with a cold forceps                            for histology.                           A small hiatal hernia was present.                           The exam of the stomach was otherwise normal.                           The duodenal bulb and second portion of the                            duodenum were  normal. Biopsies for histology were                            taken with a cold forceps for evaluation of celiac                            disease. Complications:            No immediate complications. Estimated Blood Loss:     Estimated blood loss was minimal. Impression:               - Normal esophagus.                           - Erythematous mucosa in the greater curvature.                            Biopsied.                           - Small hiatal hernia.                           - Normal duodenal bulb and second portion of the                            duodenum. Biopsied. Recommendation:           - Patient has a contact number available for                            emergencies. The signs and symptoms of potential                            delayed complications were discussed with the                            patient. Return to normal activities tomorrow.                            Written discharge instructions were provided to the                             patient.                           - Resume previous diet.                           - Continue present medications.                           - Await pathology results.                           - Repeat upper endoscopy in 3 years for  surveillance.                           - Resume Plavix (clopidogrel) at prior dose in 2                            days. Refer to managing physician for further                            adjustment of therapy.                           - FeSO4 325 mg po bid with food for 3 months. Ladene Artist, MD 04/18/2022 3:38:11 PM This report has been signed electronically.

## 2022-04-18 NOTE — Progress Notes (Signed)
Pt's states no medical or surgical changes since previsit or office visit. 

## 2022-04-18 NOTE — Patient Instructions (Signed)
Please read handouts provided. Continue present medications. Await pathology results. Repeat EGD in 3 years for screening. Resume Plavix ( clopidogrel ) at prior dose in 2 days  04/20/2022. High Fiber Diet. FeSO4 325 mg twice daily with food for 3 months.  YOU HAD AN ENDOSCOPIC PROCEDURE TODAY AT [facility]:   Refer to the procedure report that was given to you for any specific questions about what was found during the examination.  If the procedure report does not answer your questions, please call your gastroenterologist to clarify.  If you requested that your care partner not be given the details of your procedure findings, then the procedure report has been included in a sealed envelope for you to review at your convenience later.  YOU SHOULD EXPECT: Some feelings of bloating in the abdomen. Passage of more gas than usual.  Walking can help get rid of the air that was put into your GI tract during the procedure and reduce the bloating. If you had a lower endoscopy (such as a colonoscopy or flexible sigmoidoscopy) you may notice spotting of blood in your stool or on the toilet paper. If you underwent a bowel prep for your procedure, you may not have a normal bowel movement for a few days.  Please Note:  You might notice some irritation and congestion in your nose or some drainage.  This is from the oxygen used during your procedure.  There is no need for concern and it should clear up in a day or so.  SYMPTOMS TO REPORT IMMEDIATELY:  Following lower endoscopy (colonoscopy or flexible sigmoidoscopy):  Excessive amounts of blood in the stool  Significant tenderness or worsening of abdominal pains  Swelling of the abdomen that is new, acute  Fever of 100F or higher  Following upper endoscopy (EGD)  Vomiting of blood or coffee ground material  New chest pain or pain under the shoulder blades  Painful or persistently difficult swallowing  New shortness of breath  Fever of  100F or higher  Black, tarry-looking stools  For urgent or emergent issues, a gastroenterologist can be reached at any hour by calling [phone]. Do not use MyChart messaging for urgent concerns.    DIET:  We do recommend a small meal at first, but then you may proceed to your regular diet.  Drink plenty of fluids but you should avoid alcoholic beverages for 24 hours.  ACTIVITY:  You should plan to take it easy for the rest of today and you should NOT DRIVE or use heavy machinery until tomorrow (because of the sedation medicines used during the test).    FOLLOW UP: Our staff will call the number listed on your records the next business day following your procedure.  We will call around 7:15- 8:00 am to check on you and address any questions or concerns that you may have regarding the information given to you following your procedure. If we do not reach you, we will leave a message.     If any biopsies were taken you will be contacted by phone or by letter within the next 1-3 weeks.  Please call us at [phone] if you have not heard about the biopsies in 3 weeks.    SIGNATURES/CONFIDENTIALITY: You and/or your care partner have signed paperwork which will be entered into your electronic medical record.  These signatures attest to the fact that that the information above on your After Visit Summary has been reviewed and is understood.  Full responsibility  of the confidentiality of this discharge information lies with you and/or your care-partner.

## 2022-04-18 NOTE — Progress Notes (Signed)
Sedate, gd SR, tolerated procedure well, VSS, report to RN 

## 2022-04-18 NOTE — Op Note (Addendum)
[name] Procedure Date: 04/18/2022 3:01 PM MRN: [id number] Endoscopist: Ladene Artist , MD, [id number] Age: 62 Referring MD:  Date of Birth: July 02, 1960 Gender: Male Account #: [id number] Procedure:                Colonoscopy Indications:              Surveillance: Personal history of adenomatous                            polyps on last colonoscopy 3 years ago, Iron                            deficiency anemia Medicines:                Monitored Anesthesia Care Procedure:                Pre-Anesthesia Assessment:                           - Prior to the procedure, a History and Physical                            was performed, and patient medications and                            allergies were reviewed. The patient's tolerance of                            previous anesthesia was also reviewed. The risks                            and benefits of the procedure and the sedation                            options and risks were discussed with the patient.                            All questions were answered, and informed consent                            was obtained. Prior Anticoagulants: The patient has                            taken Plavix (clopidogrel), last dose was 5 days                            prior to procedure. ASA Grade Assessment: III - A                            patient with severe systemic disease. After                            reviewing the risks and benefits, the patient was  deemed in satisfactory condition to undergo the                            procedure.                           After obtaining informed consent, the colonoscope                            was passed under direct vision. Throughout the                            procedure, the patient's blood pressure, pulse, and                            oxygen saturations were monitored continuously. The                             Olympus CF-HQ190L [id number]) Colonoscope was                            introduced through the anus and advanced to the the                            cecum, identified by appendiceal orifice and                            ileocecal valve. The ileocecal valve, appendiceal                            orifice, and rectum were photographed. The quality                            of the bowel preparation was good. The colonoscopy                            was performed without difficulty. The patient                            tolerated the procedure well. Scope In: 3:04:53 PM Scope Out: 3:19:03 PM Scope Withdrawal Time: 0 hours 11 minutes 0 seconds  Total Procedure Duration: 0 hours 14 minutes 10 seconds  Findings:                 The perianal and digital rectal examinations were                            normal.                           A 7 mm polyp was found in the transverse colon. The                            polyp was sessile. The polyp was removed with a  cold snare. Resection and retrieval were complete.                           Multiple small-mouthed diverticula were found in                            the entire colon. There was no evidence of                            diverticular bleeding.                           Internal hemorrhoids were found during                            retroflexion. The hemorrhoids were small and Grade                            I (internal hemorrhoids that do not prolapse).                           The exam was otherwise without abnormality on                            direct and retroflexion views. Complications:            No immediate complications. Estimated blood loss:                            None. Estimated Blood Loss:     Estimated blood loss: none. Impression:               - One 7 mm polyp in the transverse colon, removed                            with a cold snare. Resected and retrieved.                            - Mild diverticulosis in the entire examined colon.                           - Internal hemorrhoids.                           - The examination was otherwise normal on direct                            and retroflexion views. Recommendation:           - Repeat colonoscopy after studies are complete for                            surveillance based on pathology results.                           - Patient has a contact number available for  emergencies. The signs and symptoms of potential                            delayed complications were discussed with the                            patient. Return to normal activities tomorrow.                            Written discharge instructions were provided to the                            patient.                           - High fiber diet.                           - Continue present medications.                           - Await pathology results.                           - Resume Plavix (clopidogrel) at prior dose in 2                            days. Refer to managing physician for further                            adjustment of therapy. Ladene Artist, MD 04/18/2022 3:35:27 PM This report has been signed electronically.

## 2022-04-19 ENCOUNTER — Telehealth: Payer: Self-pay

## 2022-04-19 NOTE — Telephone Encounter (Signed)
Follow up call placed, no answer, no VM.

## 2022-05-02 ENCOUNTER — Encounter: Payer: Self-pay | Admitting: Gastroenterology

## 2023-01-04 ENCOUNTER — Emergency Department (HOSPITAL_COMMUNITY)
Admission: EM | Admit: 2023-01-04 | Discharge: 2023-01-04 | Disposition: A | Discharge status: Home / Self Care | Payer: BC Managed Care – PPO | Attending: Emergency Medicine | Admitting: Emergency Medicine

## 2023-01-04 ENCOUNTER — Emergency Department (HOSPITAL_COMMUNITY): Payer: BC Managed Care – PPO

## 2023-01-04 ENCOUNTER — Other Ambulatory Visit: Payer: Self-pay

## 2023-01-04 ENCOUNTER — Encounter (HOSPITAL_COMMUNITY): Payer: Self-pay | Admitting: *Deleted

## 2023-01-04 DIAGNOSIS — R1011 Right upper quadrant pain: Secondary | ICD-10-CM | POA: Insufficient documentation

## 2023-01-04 DIAGNOSIS — R0602 Shortness of breath: Secondary | ICD-10-CM | POA: Diagnosis not present

## 2023-01-04 DIAGNOSIS — R61 Generalized hyperhidrosis: Secondary | ICD-10-CM | POA: Diagnosis not present

## 2023-01-04 DIAGNOSIS — R0789 Other chest pain: Secondary | ICD-10-CM | POA: Diagnosis not present

## 2023-01-04 DIAGNOSIS — J449 Chronic obstructive pulmonary disease, unspecified: Secondary | ICD-10-CM | POA: Diagnosis not present

## 2023-01-04 DIAGNOSIS — I2510 Atherosclerotic heart disease of native coronary artery without angina pectoris: Secondary | ICD-10-CM | POA: Diagnosis not present

## 2023-01-04 DIAGNOSIS — Z7951 Long term (current) use of inhaled steroids: Secondary | ICD-10-CM | POA: Diagnosis not present

## 2023-01-04 DIAGNOSIS — R079 Chest pain, unspecified: Secondary | ICD-10-CM | POA: Diagnosis present

## 2023-01-04 LAB — CBC
HCT: 42.6 % (ref 39.0–52.0)
Hemoglobin: 13.9 g/dL (ref 13.0–17.0)
MCH: 28.4 pg (ref 26.0–34.0)
MCHC: 32.6 g/dL (ref 30.0–36.0)
MCV: 87.1 fL (ref 80.0–100.0)
Platelets: 239 10*3/uL (ref 150–400)
RBC: 4.89 MIL/uL (ref 4.22–5.81)
RDW: 12.6 % (ref 11.5–15.5)
WBC: 8.5 10*3/uL (ref 4.0–10.5)
nRBC: 0 % (ref 0.0–0.2)

## 2023-01-04 LAB — BASIC METABOLIC PANEL
Anion gap: 9 (ref 5–15)
BUN: 16 mg/dL (ref 6–20)
CO2: 25 mmol/L (ref 22–32)
Calcium: 9 mg/dL (ref 8.9–10.3)
Chloride: 105 mmol/L (ref 98–111)
Creatinine, Ser: 1.11 mg/dL (ref 0.61–1.24)
GFR, Estimated: >60 mL/min (ref >60)
Glucose, Bld: 94 mg/dL (ref 70–99)
Potassium: 4 mmol/L (ref 3.5–5.1)
Sodium: 139 mmol/L (ref 135–145)

## 2023-01-04 LAB — TROPONIN I (HIGH SENSITIVITY)
Troponin I (High Sensitivity): 6 ng/L (ref <18)
Troponin I (High Sensitivity): 6 ng/L (ref <18)

## 2023-01-04 MED ORDER — HYDROCODONE-ACETAMINOPHEN 5-325 MG PO TABS: 1.0000 | ORAL_TABLET | ORAL | 0 refills | Status: DC | PRN

## 2023-01-04 MED ORDER — HYDROCODONE-ACETAMINOPHEN 5-325 MG PO TABS
2.0000 | ORAL_TABLET | Freq: Once | ORAL | Status: AC
  Administered 2023-01-04: 2 via ORAL
  Filled 2023-01-04: qty 2

## 2023-01-04 NOTE — ED Triage Notes (Signed)
Pt with mid CP and SOB x 3 hours, + diaphoresis   denies any N/V

## 2023-01-04 NOTE — ED Provider Notes (Signed)
[place] EMERGENCY DEPARTMENT AT [facility] Provider Note   CSN: [id number] Arrival date & time: 01/04/23  1555     History  Chief Complaint  Patient presents with   Chest Pain    Colin Lee is a 62 y.o. male.  Colin Lee is a 62 yo male with past medical history pertinent for COPD, ILD, and CAD s/p PCI of RCA who presents today for chest pain. Patient stated the chest pain started at 2pm today and has not resolved. Patient states he commonly has chest pain but it resolves. Patient states the pain is a 8/10 and constant. Patient states when he takes a deep breath, his pain is worse. Patient reports pain being RUQ, just above and below the right breast bone. Patient states he had symptoms of chest pain, SOB, and diaphoresis on onset. Patient denies any changes in BM, no dysuria, hematuria, nausea/vomiting, and abdominal pain. Patient denies SOB at this time. Patient is on Plavix and is medically compliant. Patient states he sees a pulmonologist on a yearly bases for his COPD and ILD.     Chest Pain      Home Medications Prior to Admission medications   Medication Sig Start Date End Date Taking? Authorizing Provider  HYDROcodone-acetaminophen (NORCO) 5-325 MG tablet Take 1 tablet by mouth every 4 (four) hours as needed for moderate pain. 01/04/23  Yes Cheron Schaumann K, PA-C  amLODipine (NORVASC) 2.5 MG tablet Take 1 tablet (2.5 mg total) by mouth daily. 02/22/21   Quintella Reichert, MD  atorvastatin (LIPITOR) 40 MG tablet TAKE (1/2) TABLET BY MOUTH ONCE DAILY. 03/22/22   Quintella Reichert, MD  clopidogrel (PLAVIX) 75 MG tablet TAKE 1 TABLET BY MOUTH ONCE DAILY. 03/04/22   Quintella Reichert, MD  famotidine (PEPCID) 20 MG tablet One after supper 03/29/19   Nyoka Cowden, MD  Fluticasone-Umeclidin-Vilant (TRELEGY ELLIPTA) 100-62.5-25 MCG/INH AEPB Inhale 100 fluid ounces into the lungs daily at 12 noon. 06/12/20   [provider]  losartan (COZAAR) 100 MG  tablet Take 1 tablet (100 mg total) by mouth daily. 02/20/21   Quintella Reichert, MD  metoprolol tartrate (LOPRESSOR) 25 MG tablet Take 0.5 tablets (12.5 mg total) by mouth 2 (two) times daily. 02/20/21   Quintella Reichert, MD  Multiple Vitamins-Minerals (CENTRUM ADULTS PO) Take 1 tablet by mouth daily.    [provider]  mycophenolate (CELLCEPT) 500 MG tablet Take 500 mg by mouth 2 (two) times daily.    [provider]  Omega-3 Fatty Acids (FISH OIL) 1000 MG CPDR Take 4,000 mg by mouth daily. 02/10/15   Quintella Reichert, MD  pantoprazole (PROTONIX) 40 MG tablet TAKE 1 TABLET BY MOUTH ONCE DAILY. 03/04/22   Quintella Reichert, MD  testosterone cypionate (DEPOTESTOSTERONE CYPIONATE) 200 MG/ML injection Inject 200 mg into the muscle every 30 (thirty) days. 10/24/20   [provider]  Vitamin D, Ergocalciferol, (DRISDOL) 1.25 MG (50000 UNIT) CAPS capsule Take 1 capsule (50,000 Units total) by mouth every 7 (seven) days. Patient not taking: Reported on 04/18/2022 09/27/20   Quillian Quince D, MD      Allergies    Hydroxychloroquine    Review of Systems   Review of Systems  Cardiovascular:  Positive for chest pain.  All other systems reviewed and are negative.   Physical Exam Updated Vital Signs BP (!) 147/77 (BP Location: Right Arm)   Pulse 68   Temp 98.5 F (36.9 C) (Oral)   Resp  16   Ht 5\' 10"  (1.778 m)   Wt 102.1 kg   SpO2 99%   BMI 32.28 kg/m  Physical Exam Vitals and nursing note reviewed.  Constitutional:      Appearance: He is well-developed.  HENT:     Head: Normocephalic.  Cardiovascular:     Rate and Rhythm: Normal rate and regular rhythm.     Heart sounds: Normal heart sounds.  Pulmonary:     Effort: Pulmonary effort is normal.     Breath sounds: Normal breath sounds.  Abdominal:     General: Bowel sounds are normal. There is no distension.     Palpations: Abdomen is soft.  Musculoskeletal:        General: Normal range of motion.     Cervical  back: Normal range of motion.  Skin:    General: Skin is warm.  Neurological:     General: No focal deficit present.     Mental Status: He is alert and oriented to person, place, and time.     ED Results / Procedures / Treatments   Labs (all labs ordered are listed, but only abnormal results are displayed) Labs Reviewed  BASIC METABOLIC PANEL  CBC  TROPONIN I (HIGH SENSITIVITY)  TROPONIN I (HIGH SENSITIVITY)    EKG None  Radiology DG Chest 2 View  Result Date: 01/04/2023 CLINICAL DATA:  Chest pain and shortness of breath over the last 3 hours. EXAM: CHEST - 2 VIEW COMPARISON:  05/15/2020 FINDINGS: Heart size is normal. Chronic pulmonary scarring and bronchial thickening, similar to prior exams. No sign of acute infiltrate, collapse or effusion. Chronic degenerative changes affect the spine. IMPRESSION: Chronic pulmonary scarring and bronchial thickening. No acute finding. Electronically Signed   By: Paulina Fusi M.D.   On: 01/04/2023 17:08    Procedures Procedures    Medications Ordered in ED Medications  HYDROcodone-acetaminophen (NORCO/VICODIN) 5-325 MG per tablet 2 tablet (2 tablets Oral Given 01/04/23 2014)    ED Course/ Medical Decision Making/ A&P                                 Medical Decision Making Pt complains of has pain that started tonight.  Patient denies any injuries.  Patient denies any shortness of breath.  Patient reports that pain is on the right side of his chest.  Amount and/or Complexity of Data Reviewed Labs: ordered. Decision-making details documented in ED Course.    Details: Labs ordered reviewed and interpreted troponin is negative x 2 Radiology: ordered and independent interpretation performed. Decision-making details documented in ED Course.    Details: Chest x-ray ordered reviewed and interpreted chest x-ray shows no acute abnormality. ECG/medicine tests: ordered and independent interpretation performed. Decision-making details documented  in ED Course.    Details: EKG shows normal sinus rhythm normal EKG Discussion of management or test interpretation with external provider(s): Cardiology notes reviewed.  Has had 2 previous stents.  Risk Prescription drug management. Risk Details: Patient has constant pain.  patient given 2 hydrocodone here for pain.  Patient reports improvement of symptoms.  Patient counseled on the need for follow-up.  She is advised to return to the emergency department if symptoms worsen or change           Final Clinical Impression(s) / ED Diagnoses Final diagnoses:  Atypical chest pain    Rx / DC Orders ED Discharge Orders  Ordered    HYDROcodone-acetaminophen (NORCO) 5-325 MG tablet  Every 4 hours PRN        01/04/23 2158           An After Visit Summary was printed and given to the patient.    Elson Areas, PA-C 01/04/23 2326    Royanne Foots, DO 01/07/23 [phone]

## 2023-01-06 MED FILL — Hydrocodone-Acetaminophen Tab 5-325 MG: ORAL | Qty: 6 | Status: AC

## 2023-01-23 ENCOUNTER — Other Ambulatory Visit: Payer: Self-pay | Admitting: Cardiology

## 2023-02-24 ENCOUNTER — Ambulatory Visit: Payer: BC Managed Care – PPO | Admitting: Nurse Practitioner

## 2023-02-24 LAB — HEMOGLOBIN A1C: A1c: 5.4

## 2023-02-26 NOTE — Progress Notes (Unsigned)
Cardiology Office Note    Patient Name: Colin Lee Date of Encounter: 02/26/2023  Primary Care Provider:  [facility] Primary Cardiologist:  Armanda Magic, MD Primary Electrophysiologist: None   Past Medical History    Past Medical History:  Diagnosis Date   Acute febrile illness 10/20/2019   Allergy    Arthralgia 06/03/2014   Arthritis    Asthma    Bacteremia 10/21/2019   Cellulitis and abscess of right leg with Bacteremia 10/21/2019   Chest pain 05/31/2014   Chewing difficulty    Cholelithiases 10/26/2013   Claudication (HCC) 04/15/2016   Constipation    COPD (chronic obstructive pulmonary disease) (HCC) 10/21/2019   Coronary artery disease 2016   90% RCA and 30% LM S/P PCI of RCA   Depression    DOE (dyspnea on exertion)    Quit smoking 2002 - Spirometry 03/29/2019  FEV1 2.25 (55%)  Ratio 0.74 with no resp to saba p ? Prior    Onset June 2020 with cards w/u neg 02/2019 and assoc with atypical cp on prn ppi - 03/29/2019   Walked RA x two laps =  approx 531ft @ fast pace - stopped due to end of study with sats of 92% at the end of the study and mild sob  - 03/29/2019 max rx for GERD      Elevated troponin    Fatty liver    GERD (gastroesophageal reflux disease)    Headache    stress and tension   History of nuclear stress test    Myoview 1/19: EF 61, no ischemia, low risk   Hyperlipidemia    Hypertension    Joint pain    Melanoma (HCC) 11/24/2019   Right upper back. Superficial spreading. Breslow's 0.63mm. 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 male with PMH of CAD s/p DES x2 to RCA in 2016, HTN, HLD, pleural effusion s/p talc pleurodesis, COPD who presents today for 1 year follow-up.  Colin Lee was last seen on 02/21/2022 for preoperative clearance and follow-up visit.  During visit patient was doing well with controlled blood pressures and continue to be followed by pulmonology for chronic shortness of breath related to pulmonary fibrosis.  He was last seen in the ED on 01/04/2023 with complaint of chest pain.  Visit patient reported 8 out of 10 and constant chest pain that is worse with deep breath.  Troponins were negative x 2 and chest x-ray showed no acute abnormalities.  EKG was sinus rhythm with no acute changes.  Colin Lee presents today for 1 year follow-up.  He was recently seen in the ED for complaint of chest pain.  The pain was reminiscent of a childhood episode of pleurisy.  He was diagnosed with pleurisy and prescribed pain medication, which provided only partial relief. The patient reports a recent recurrence of the pain, now located in the back. The patient has contacted his pulmonologist for a follow-up appointment.  He continues to work full-time at [employer] in [place].  He also reports difficulty eating due to the appetite suppressant effects of his medication. He reports sporadic eating habits and occasional sickness due to not eating for long periods. Despite these challenges, the patient is pleased with his weight loss progress. Patient denies chest pain, palpitations, dyspnea, PND, orthopnea, nausea, vomiting, dizziness, syncope, edema, weight gain, or early satiety.  Review of Systems  Please see the history of present illness.    All other systems reviewed  and are otherwise negative except as noted above.  Physical Exam    Wt Readings from Last 3 Encounters:  01/04/23 225 lb (102.1 kg)  04/18/22 247 lb (112 kg)  02/21/22 244 lb (110.7 kg)   ZO:XWRUE were no vitals filed for this visit.,There is no height or weight on file to calculate BMI. GEN: Well nourished, well developed in no acute distress Neck: No JVD; No carotid bruits Pulmonary: Clear to auscultation without rales, wheezing or rhonchi  Cardiovascular: Normal rate. Regular rhythm. Normal S1. Normal S2.   Murmurs: There is no murmur.  ABDOMEN: Soft, non-tender, non-distended EXTREMITIES:  No edema; No deformity   EKG/LABS/ Recent Cardiac Studies   ECG personally reviewed by me today -none completed today  Risk Assessment/Calculations:     Lab Results  Component Value Date   WBC 8.5 01/04/2023   HGB 13.9 01/04/2023   HCT 42.6 01/04/2023   MCV 87.1 01/04/2023   PLT 239 01/04/2023   Lab Results  Component Value Date   CREATININE 1.11 01/04/2023   BUN 16 01/04/2023   NA 139 01/04/2023   K 4.0 01/04/2023   CL 105 01/04/2023   CO2 25 01/04/2023   Lab Results  Component Value Date   CHOL 128 02/28/2021   HDL 41 02/28/2021   LDLCALC 70 02/28/2021   TRIG 85 02/28/2021   CHOLHDL 3.1 02/28/2021    Lab Results  Component Value Date   HGBA1C 6.5 (H) 05/23/2020   Assessment & Plan    1.Coronary artery disease: -s/p DES x2 to proximal RCA with high-grade stenosis in 2016 -Patient was seen on 12/2022 with complaint of chest pain that was found to be atypical with negative troponins x 2 -Today patient reports no chest pain but endorses some low back pain. -Continue current GDMT with Plavix 75 mg daily, metoprolol 12.5 mg twice daily Lipitor 20 mg daily  2.Essential hypertension: -Patient's blood pressure today was well-controlled at 126/80 -Continue metoprolol 12.5 mg twice daily, losartan 100 mg daily  3.Hyperlipidemia: -Patient recently had cholesterol numbers  checked by PCP -They are currently not available and will obtain copies from most recent visit -Continue Lipitor 20 mg daily  4.History of COPD: -Patient reports diagnosis of pleurisy during previous ED visit and is scheduled for a follow-up visit with pulmonologist. -Continue current treatment plan per pulmonology  Disposition: Follow-up with Armanda Magic, MD or APP in 12 months    Signed, Napoleon Form, Leodis Rains, NP 02/26/2023, 11:40 AM [facility]

## 2023-02-27 ENCOUNTER — Other Ambulatory Visit: Payer: Self-pay | Admitting: Cardiology

## 2023-02-27 ENCOUNTER — Ambulatory Visit: Payer: BC Managed Care – PPO | Attending: Nurse Practitioner | Admitting: Nurse Practitioner

## 2023-02-27 ENCOUNTER — Encounter: Payer: Self-pay | Admitting: Nurse Practitioner

## 2023-02-27 VITALS — BP 126/80 | HR 64 | Ht 70.0 in | Wt 226.0 lb

## 2023-02-27 DIAGNOSIS — J449 Chronic obstructive pulmonary disease, unspecified: Secondary | ICD-10-CM

## 2023-02-27 DIAGNOSIS — I10 Essential (primary) hypertension: Secondary | ICD-10-CM | POA: Diagnosis not present

## 2023-02-27 DIAGNOSIS — I2510 Atherosclerotic heart disease of native coronary artery without angina pectoris: Secondary | ICD-10-CM | POA: Diagnosis not present

## 2023-02-27 DIAGNOSIS — E785 Hyperlipidemia, unspecified: Secondary | ICD-10-CM

## 2023-02-27 LAB — LAB REPORT - SCANNED: EGFR: 89

## 2023-02-27 MED ORDER — CLOPIDOGREL BISULFATE 75 MG PO TABS: 75.0000 mg | ORAL_TABLET | Freq: Every day | ORAL | 6 refills | Status: DC

## 2023-02-27 MED ORDER — PANTOPRAZOLE SODIUM 40 MG PO TBEC: 40.0000 mg | DELAYED_RELEASE_TABLET | Freq: Every day | ORAL | 6 refills | Status: DC

## 2023-02-27 MED ORDER — METOPROLOL TARTRATE 25 MG PO TABS: 12.5000 mg | ORAL_TABLET | Freq: Two times a day (BID) | ORAL | 6 refills | Status: DC

## 2023-02-27 MED ORDER — LOSARTAN POTASSIUM 100 MG PO TABS: 100.0000 mg | ORAL_TABLET | Freq: Every day | ORAL | 6 refills | Status: DC

## 2023-02-27 MED ORDER — AMLODIPINE BESYLATE 2.5 MG PO TABS: 2.5000 mg | ORAL_TABLET | Freq: Every day | ORAL | 6 refills | Status: DC

## 2023-02-27 MED ORDER — ATORVASTATIN CALCIUM 20 MG PO TABS: 20.0000 mg | ORAL_TABLET | Freq: Every day | ORAL | 6 refills | Status: DC

## 2023-02-27 NOTE — Patient Instructions (Signed)
Medication Instructions:  Your physician recommends that you continue on your current medications as directed. Please refer to the Current Medication list given to you today. *If you need a refill on your cardiac medications before your next appointment, please call your pharmacy*   Lab Work: None ordered   Testing/Procedures: None ordered   Follow-Up: At [facility], you and your health needs are our priority.  As part of our continuing mission to provide you with exceptional heart care, we have created designated Provider Care Teams.  These Care Teams include your primary Cardiologist (physician) and Advanced Practice Providers (APPs -  Physician Assistants and Nurse Practitioners) who all work together to provide you with the care you need, when you need it.  We recommend signing up for the patient portal called "MyChart".  Sign up information is provided on this After Visit Summary.  MyChart is used to connect with patients for Virtual Visits (Telemedicine).  Patients are able to view lab/test results, encounter notes, upcoming appointments, etc.  Non-urgent messages can be sent to your provider as well.   To learn more about what you can do with MyChart, go to [email or web address].    Your next appointment:   12 month(s)  Provider:   Traci Turner, MD   Other Instructions   

## 2023-05-16 ENCOUNTER — Other Ambulatory Visit: Payer: Self-pay | Admitting: Specialist

## 2023-05-16 DIAGNOSIS — R0602 Shortness of breath: Secondary | ICD-10-CM

## 2023-05-16 DIAGNOSIS — J849 Interstitial pulmonary disease, unspecified: Secondary | ICD-10-CM

## 2023-05-16 DIAGNOSIS — R058 Other specified cough: Secondary | ICD-10-CM

## 2023-05-22 ENCOUNTER — Emergency Department (HOSPITAL_COMMUNITY)
Admission: EM | Admit: 2023-05-22 | Discharge: 2023-05-22 | Disposition: A | Discharge status: Home / Self Care | Payer: BC Managed Care – PPO | Attending: Student | Admitting: Student

## 2023-05-22 ENCOUNTER — Other Ambulatory Visit: Payer: Self-pay

## 2023-05-22 ENCOUNTER — Encounter (HOSPITAL_COMMUNITY): Payer: Self-pay

## 2023-05-22 DIAGNOSIS — J449 Chronic obstructive pulmonary disease, unspecified: Secondary | ICD-10-CM | POA: Diagnosis not present

## 2023-05-22 DIAGNOSIS — K529 Noninfective gastroenteritis and colitis, unspecified: Secondary | ICD-10-CM | POA: Insufficient documentation

## 2023-05-22 DIAGNOSIS — R197 Diarrhea, unspecified: Secondary | ICD-10-CM | POA: Diagnosis present

## 2023-05-22 LAB — COMPREHENSIVE METABOLIC PANEL
ALT: 37 U/L (ref 0–44)
AST: 27 U/L (ref 15–41)
Albumin: 4.1 g/dL (ref 3.5–5.0)
Alkaline Phosphatase: 62 U/L (ref 38–126)
Anion gap: 7 (ref 5–15)
BUN: 14 mg/dL (ref 6–20)
CO2: 24 mmol/L (ref 22–32)
Calcium: 8.9 mg/dL (ref 8.9–10.3)
Chloride: 106 mmol/L (ref 98–111)
Creatinine, Ser: 0.8 mg/dL (ref 0.61–1.24)
GFR, Estimated: >60 mL/min (ref >60)
Glucose, Bld: 90 mg/dL (ref 70–99)
Potassium: 4 mmol/L (ref 3.5–5.1)
Sodium: 137 mmol/L (ref 135–145)
Total Bilirubin: 0.7 mg/dL (ref 0.0–1.2)
Total Protein: 7.4 g/dL (ref 6.5–8.1)

## 2023-05-22 LAB — CBC WITH DIFFERENTIAL/PLATELET
Abs Immature Granulocytes: 0.01 10*3/uL (ref 0.00–0.07)
Basophils Absolute: 0 10*3/uL (ref 0.0–0.1)
Basophils Relative: 0 %
Eosinophils Absolute: 0.2 10*3/uL (ref 0.0–0.5)
Eosinophils Relative: 3 %
HCT: 40.8 % (ref 39.0–52.0)
Hemoglobin: 13.9 g/dL (ref 13.0–17.0)
Immature Granulocytes: 0 %
Lymphocytes Relative: 32 %
Lymphs Abs: 2.1 10*3/uL (ref 0.7–4.0)
MCH: 29 pg (ref 26.0–34.0)
MCHC: 34.1 g/dL (ref 30.0–36.0)
MCV: 85 fL (ref 80.0–100.0)
Monocytes Absolute: 0.5 10*3/uL (ref 0.1–1.0)
Monocytes Relative: 8 %
Neutro Abs: 3.5 10*3/uL (ref 1.7–7.7)
Neutrophils Relative %: 57 %
Platelets: 279 10*3/uL (ref 150–400)
RBC: 4.8 MIL/uL (ref 4.22–5.81)
RDW: 13.2 % (ref 11.5–15.5)
WBC: 6.3 10*3/uL (ref 4.0–10.5)
nRBC: 0 % (ref 0.0–0.2)

## 2023-05-22 LAB — URINALYSIS, ROUTINE W REFLEX MICROSCOPIC
Bilirubin Urine: NEGATIVE
Glucose, UA: NEGATIVE mg/dL
Hgb urine dipstick: NEGATIVE
Ketones, ur: NEGATIVE mg/dL
Leukocytes,Ua: NEGATIVE
Nitrite: NEGATIVE
Protein, ur: NEGATIVE mg/dL
Specific Gravity, Urine: 1.029 (ref 1.005–1.030)
pH: 5 (ref 5.0–8.0)

## 2023-05-22 LAB — RESP PANEL BY RT-PCR (RSV, FLU A&B, COVID)  RVPGX2
Influenza A by PCR: NEGATIVE
Influenza B by PCR: NEGATIVE
Resp Syncytial Virus by PCR: NEGATIVE
SARS Coronavirus 2 by RT PCR: NEGATIVE

## 2023-05-22 LAB — MAGNESIUM: Magnesium: 1.5 mg/dL — ABNORMAL LOW (ref 1.7–2.4)

## 2023-05-22 MED ORDER — MAGNESIUM SULFATE 2 GM/50ML IV SOLN: 2.0000 g | Freq: Once | INTRAVENOUS | Status: DC

## 2023-05-22 MED ORDER — SODIUM CHLORIDE 0.9 % IV BOLUS
1000.0000 mL | Freq: Once | INTRAVENOUS | Status: AC
  Administered 2023-05-22: 1000 mL via INTRAVENOUS

## 2023-05-22 MED ORDER — MAGNESIUM OXIDE -MG SUPPLEMENT 400 (240 MG) MG PO TABS
800.0000 mg | ORAL_TABLET | Freq: Once | ORAL | Status: AC
  Administered 2023-05-22: 800 mg via ORAL
  Filled 2023-05-22: qty 2

## 2023-05-22 MED ORDER — ONDANSETRON HCL 4 MG PO TABS
4.0000 mg | ORAL_TABLET | Freq: Four times a day (QID) | ORAL | 0 refills | Status: DC
Start: 2023-05-22 — End: 2023-10-23

## 2023-05-22 MED ORDER — ONDANSETRON 4 MG PO TBDP
4.0000 mg | ORAL_TABLET | Freq: Once | ORAL | Status: AC
  Administered 2023-05-22: 4 mg via ORAL
  Filled 2023-05-22: qty 1

## 2023-05-22 NOTE — ED Triage Notes (Signed)
Pt arrived via POV c/o diarrhea since last Saturday. Pt thinks he might have a stomach bug. Pts significant  other recently had N/V/D. Pt reports decreased appetite.

## 2023-05-22 NOTE — ED Provider Notes (Signed)
[place] EMERGENCY DEPARTMENT AT [facility] Provider Note   CSN: [id number] Arrival date & time: 05/22/23  1127     History  Chief Complaint  Patient presents with   Diarrhea    Colin Lee is a 63 y.o. male history of toxic epidermal neck lysis, COPD, rheumatoid arthritis, GERD, interstitial lung disease presented for diarrhea since Saturday.  Wife and him had dominoes afterwards wife had nausea vomiting and diarrhea that resolved however patient began having symptoms shortly after her.  Patient initially had episodes of emesis however this is since resolved.  Patient denies any fevers.  Patient tried Pepto-Bismol no relief.  Patient has not been to keep anything down for the past few days.  Patient was told to come here by his primary care provider.   Home Medications Prior to Admission medications   Medication Sig Start Date End Date Taking? Authorizing Provider  ondansetron (ZOFRAN) 4 MG tablet Take 1 tablet (4 mg total) by mouth every 6 (six) hours. 05/22/23  Yes Evlyn Kanner T, PA-C  amLODipine (NORVASC) 2.5 MG tablet Take 1 tablet (2.5 mg total) by mouth daily. 02/27/23   Gaston Islam., NP  atorvastatin (LIPITOR) 20 MG tablet Take 1 tablet (20 mg total) by mouth daily. 02/27/23   Gaston Islam., NP  clopidogrel (PLAVIX) 75 MG tablet Take 1 tablet (75 mg total) by mouth daily. 02/27/23   Gaston Islam., NP  famotidine (PEPCID) 20 MG tablet One after supper 03/29/19   Nyoka Cowden, MD  Fluticasone-Umeclidin-Vilant (TRELEGY ELLIPTA) 100-62.5-25 MCG/INH AEPB Inhale 100 fluid ounces into the lungs daily at 12 noon. 06/12/20   [provider]  HYDROcodone-acetaminophen (NORCO) 5-325 MG tablet Take 1 tablet by mouth every 4 (four) hours as needed for moderate pain. 01/04/23   Elson Areas, PA-C  losartan (COZAAR) 100 MG tablet Take 1 tablet (100 mg total) by mouth daily. 02/27/23   Gaston Islam., NP  metoprolol tartrate (LOPRESSOR) 25 MG  tablet Take 0.5 tablets (12.5 mg total) by mouth 2 (two) times daily. 02/27/23   Gaston Islam., NP  Multiple Vitamins-Minerals (CENTRUM ADULTS PO) Take 1 tablet by mouth daily.    [provider]  mycophenolate (CELLCEPT) 500 MG tablet Take 500 mg by mouth 2 (two) times daily.    [provider]  Omega-3 Fatty Acids (FISH OIL) 1000 MG CPDR Take 4,000 mg by mouth daily. 02/10/15   Quintella Reichert, MD  pantoprazole (PROTONIX) 40 MG tablet Take 1 tablet (40 mg total) by mouth daily. 02/27/23   Gaston Islam., NP  testosterone cypionate (DEPOTESTOSTERONE CYPIONATE) 200 MG/ML injection Inject 200 mg into the muscle every 30 (thirty) days. 10/24/20   [provider]  Vitamin D, Ergocalciferol, (DRISDOL) 1.25 MG (50000 UNIT) CAPS capsule Take 1 capsule (50,000 Units total) by mouth every 7 (seven) days. 09/27/20   Wilder Glade, MD      Allergies    Hydroxychloroquine    Review of Systems   Review of Systems  Gastrointestinal:  Positive for diarrhea.    Physical Exam Updated Vital Signs BP 105/71   Pulse 64   Temp 98.7 F (37.1 C) (Oral)   Resp 18   Ht 5\' 10"  (1.778 m)   Wt 103 kg   SpO2 96%   BMI 32.58 kg/m  Physical Exam Vitals reviewed.  Constitutional:      General: He is not in acute distress. HENT:  Head: Normocephalic and atraumatic.     Mouth/Throat:     Mouth: Mucous membranes are moist.  Eyes:     Extraocular Movements: Extraocular movements intact.     Conjunctiva/sclera: Conjunctivae normal.     Pupils: Pupils are equal, round, and reactive to light.  Cardiovascular:     Rate and Rhythm: Normal rate and regular rhythm.     Pulses: Normal pulses.     Heart sounds: Normal heart sounds.     Comments: 2+ bilateral radial/dorsalis pedis pulses with regular rate Pulmonary:     Effort: Pulmonary effort is normal. No respiratory distress.     Breath sounds: Normal breath sounds.  Abdominal:     Palpations: Abdomen is soft.      Tenderness: There is no abdominal tenderness. There is no guarding or rebound.  Musculoskeletal:        General: Normal range of motion.     Cervical back: Normal range of motion and neck supple.     Comments: 5 out of 5 bilateral grip/leg extension strength  Skin:    General: Skin is warm and dry.     Capillary Refill: Capillary refill takes less than 2 seconds.  Neurological:     General: No focal deficit present.     Mental Status: He is alert and oriented to person, place, and time.     Comments: Sensation intact in all 4 limbs  Psychiatric:        Mood and Affect: Mood normal.     ED Results / Procedures / Treatments   Labs (all labs ordered are listed, but only abnormal results are displayed) Labs Reviewed  MAGNESIUM - Abnormal; Notable for the following components:      Result Value   Magnesium 1.5 (*)    All other components within normal limits  RESP PANEL BY RT-PCR (RSV, FLU A&B, COVID)  [id number]  COMPREHENSIVE METABOLIC PANEL  CBC WITH DIFFERENTIAL/PLATELET  URINALYSIS, ROUTINE W REFLEX MICROSCOPIC    EKG None  Radiology No results found.  Procedures Procedures    Medications Ordered in ED Medications  ondansetron (ZOFRAN-ODT) disintegrating tablet 4 mg (4 mg Oral Given 05/22/23 1245)  sodium chloride 0.9 % bolus 1,000 mL (1,000 mLs Intravenous New Bag/Given 05/22/23 1515)  magnesium oxide (MAG-OX) tablet 800 mg (800 mg Oral Given 05/22/23 1521)    ED Course/ Medical Decision Making/ A&P                                 Medical Decision Making Amount and/or Complexity of Data Reviewed Labs: ordered.  Risk OTC drugs. Prescription drug management.   [name] 63 y.o. presented today for N/V/D. Working DDx that I considered at this time includes, but not limited to, viral illness, gastroenteritis, electrolyte abnormalities, dehydration, colitis, pancreatitis.  R/o DDx: dehydration, colitis, pancreatitis: These are considered less likely due  to history of present illness and physical exam findings  Review of prior external notes: 05/12/2023 initial consult  Unique Tests and My Independent Interpretation:  CMP: Unremarkable CBC: Unremarkable UA: Unremarkable Respiratory panel: Negative Magnesium: 1.5  Social Determinants of Health: none  Discussion with Independent Historian:  Wife  Discussion of Management of Tests: None  Risk: Medium: prescription drug management  Risk Stratification Score: None  Plan: On exam patient was no acute distress stable vitals.  Patient feels better after the Zofran and would like to be p.o. challenge was reasonable.  Patient magnesium slight low so will p.o. challenge with magnesium pills.  Patient able to keep the magnesium pills down and do feel he can be discharged after receiving IV fluids.  I spoke to the patient we had shared decision making and patient and I both feel he does not need a CT scan as symptoms began after eating [employer] and with his wife being sick with similar symptoms after eating the same meal do feel this is gastroenteritis.  Will discharge on Zofran along with Imodium OTC and have him follow-up with his primary care provider if he is successfully p.o. challenge.  Patient feels better after fluids and was able to tolerate oral fluids along with the magnesium pills.  Patient states he feels comfortable going home following up outpatient which is reasonable.  Will discharge him with the plan listed above and discussed return precautions with him.  Discussed having a brat diet.  Do feel patient is having gastroenteritis from his [employer] pizza.  Patient was given return precautions. Patient stable for discharge at this time.  Patient verbalized understanding of plan.  This chart was dictated using voice recognition software.  Despite best efforts to proofread,  errors can occur which can change the documentation meaning.         Final Clinical Impression(s) / ED  Diagnoses Final diagnoses:  Gastroenteritis  Hypomagnesemia    Rx / DC Orders ED Discharge Orders          Ordered    ondansetron (ZOFRAN) 4 MG tablet  Every 6 hours        05/22/23 1504              Remi Deter 05/22/23 1547    Glendora Score, MD 05/22/23 1816

## 2023-05-22 NOTE — Discharge Instructions (Signed)
Please follow-up with your primary care provider regards recent ER visit.  Today your labs were reassuring but do show slightly low magnesium that was replenished in the emergency department.  I prescribed you Zofran to help with your nausea but you may try Imodium over-the-counter to help with your diarrhea.  Please eat a brat diet including bananas rice applesauce and toast.  Please remain hydrated.  If symptoms change or worsen please return to the ER.

## 2023-05-22 NOTE — ED Provider Triage Note (Signed)
Emergency Medicine Provider Triage Evaluation Note  Colin Lee , a 63 y.o. male  was evaluated in triage.  Pt complains of diarrhea since Saturday.  Wife and him had dominoes afterwards wife had nausea vomiting and diarrhea that resolved however patient began having symptoms shortly after her.  Patient denies any fevers.  Patient tried Pepto-Bismol no relief.  Patient has not been to keep anything down for the past few days.  Patient was told to come here by his primary care provider.  Review of Systems  Positive:  Negative:   Physical Exam  BP (!) 147/73 (BP Location: Left Arm)   Pulse 70   Temp 98.7 F (37.1 C) (Oral)   Resp 18   Ht 5\' 10"  (1.778 m)   Wt 103 kg   SpO2 100%   BMI 32.58 kg/m  Gen:   Awake, no distress   Resp:  Normal effort  MSK:   Moves extremities without difficulty  Other:  Abdomen nontender without peritoneal signs  Medical Decision Making  Medically screening exam initiated at 12:21 PM.  Appropriate orders placed.  GARV KUECHLE was informed that the remainder of the evaluation will be completed by another provider, this initial triage assessment does not replace that evaluation, and the importance of remaining in the ED until their evaluation is complete.  Workup initiated, patient has had sick contacts after eating [employer] with similar symptoms and do feel this is viral gastroenteritis, will give Zofran as he still nauseous, patient stable at this time.   Netta Corrigan, PA-C 05/22/23 1223

## 2023-06-02 ENCOUNTER — Ambulatory Visit
Admission: RE | Admit: 2023-06-02 | Discharge: 2023-06-02 | Disposition: A | Discharge status: Home / Self Care | Payer: BC Managed Care – PPO | Source: Ambulatory Visit | Attending: Specialist | Admitting: Specialist

## 2023-06-02 DIAGNOSIS — R058 Other specified cough: Secondary | ICD-10-CM

## 2023-06-02 DIAGNOSIS — J849 Interstitial pulmonary disease, unspecified: Secondary | ICD-10-CM

## 2023-06-02 DIAGNOSIS — R0602 Shortness of breath: Secondary | ICD-10-CM

## 2023-09-19 ENCOUNTER — Telehealth: Payer: Self-pay

## 2023-09-19 NOTE — Telephone Encounter (Signed)
 S/W pt and scheduled TELE Preop appt 09/26/23. Med Rec and consent done

## 2023-09-19 NOTE — Telephone Encounter (Signed)
 Med Rec and consent done     Patient Consent for Virtual Visit        Colin Lee has provided verbal consent on 09/19/2023 for a virtual visit (video or telephone).   CONSENT FOR VIRTUAL VISIT FOR:  Colin Lee  By participating in this virtual visit I agree to the following:  I hereby voluntarily request, consent and authorize [facility] and its employed or contracted physicians, physician assistants, nurse practitioners or other licensed health care professionals (the Practitioner), to provide me with telemedicine health care services (the "Services) as deemed necessary by the treating Practitioner. I acknowledge and consent to receive the Services by the Practitioner via telemedicine. I understand that the telemedicine visit will involve communicating with the Practitioner through live audiovisual communication technology and the disclosure of certain medical information by electronic transmission. I acknowledge that I have been given the opportunity to request an in-person assessment or other available alternative prior to the telemedicine visit and am voluntarily participating in the telemedicine visit.  I understand that I have the right to withhold or withdraw my consent to the use of telemedicine in the course of my care at any time, without affecting my right to future care or treatment, and that the Practitioner or I may terminate the telemedicine visit at any time. I understand that I have the right to inspect all information obtained and/or recorded in the course of the telemedicine visit and may receive copies of available information for a reasonable fee.  I understand that some of the potential risks of receiving the Services via telemedicine include:  Delay or interruption in medical evaluation due to technological equipment failure or disruption; Information transmitted may not be sufficient (e.g. poor resolution of images) to allow for appropriate  medical decision making by the Practitioner; and/or  In rare instances, security protocols could fail, causing a breach of personal health information.  Furthermore, I acknowledge that it is my responsibility to provide information about my medical history, conditions and care that is complete and accurate to the best of my ability. I acknowledge that Practitioner's advice, recommendations, and/or decision may be based on factors not within their control, such as incomplete or inaccurate data provided by me or distortions of diagnostic images or specimens that may result from electronic transmissions. I understand that the practice of medicine is not an exact science and that Practitioner makes no warranties or guarantees regarding treatment outcomes. I acknowledge that a copy of this consent can be made available to me via my patient portal [facility] MyChart), or I can request a printed copy by calling the office of [facility].    I understand that my insurance will be billed for this visit.   I have read or had this consent read to me. I understand the contents of this consent, which adequately explains the benefits and risks of the Services being provided via telemedicine.  I have been provided ample opportunity to ask questions regarding this consent and the Services and have had my questions answered to my satisfaction. I give my informed consent for the services to be provided through the use of telemedicine in my medical care

## 2023-09-19 NOTE — Telephone Encounter (Signed)
   Pre-operative Risk Assessment    Patient Name: Colin Lee  DOB: Apr 28, 1960 MRN: [id number]   Date of last office visit: 02/27/23 Rejeana Card, NP Date of next office visit: NONE   Request for Surgical Clearance    Procedure:  LEFT SECOND METATARSAL WEIL OSTEOTOMY WITH FLEXOR TENDON TRANSFER LAPIDUS BUNIONECTOMY WITH AKIN OSTEOTOMY VERUS FIRST METATARSAL PHALANGEAL JOINT FUSION  Date of Surgery:  Clearance 10/02/23                                Surgeon:  DR Larey Plenty Surgeon's Group or Practice Name:  [facility] Phone number:  [phone] Fax number:  [phone]   Type of Clearance Requested:   - Medical  - Pharmacy:  Hold Clopidogrel  (Plavix )     Type of Anesthesia:  Not Indicated   Additional requests/questions:    SignedCollin Deal   09/19/2023, 7:46 AM

## 2023-09-24 NOTE — Progress Notes (Signed)
 Virtual Visit via Telephone Note   Because of Colin Lee co-morbid illnesses, he is at least at moderate risk for complications without adequate follow up.  This format is felt to be most appropriate for this patient at this time.  Due to technical limitations with video connection Web designer), today's appointment will be conducted as an audio only telehealth visit, and Colin Lee verbally agreed to proceed in this manner.   All issues noted in this document were discussed and addressed.  No physical exam could be performed with this format.  Evaluation Performed:  Preoperative cardiovascular risk assessment _____________   Date:  09/24/2023   Patient ID:  Colin Lee, DOB 06-05-1960, MRN [id number] Patient Location:  Home Provider location:   Office  Primary Care Provider:  [facility] Primary Cardiologist:  Colin Keas, MD  Chief Complaint / Patient Profile   63 y.o. y/o male with a h/o hypertension, coronary artery disease, hyperlipidemia who is pending  LEFT SECOND METATARSAL WEIL OSTEOTOMY WITH FLEXOR TENDON TRANSFER LAPIDUS BUNIONECTOMY WITH AKIN OSTEOTOMY VERUS FIRST METATARSAL PHALANGEAL JOINT FUSION  and presents today for telephonic preoperative cardiovascular risk assessment.  History of Present Illness    Colin Lee is a 63 y.o. male who presents via audio/video conferencing for a telehealth visit today.  Pt was last seen in cardiology clinic on 02/27/23 by Colin Seat, NP.  At that time Colin Lee was doing well .  The patient is now pending procedure as outlined above. Since his last visit, he continues to be stable from a cardiac standpoint.  Today he denies chest pain, shortness of breath, lower extremity edema, fatigue, palpitations, melena, hematuria, hemoptysis, diaphoresis, weakness, presyncope, syncope, orthopnea, and PND.   Past Medical History    Past Medical History:  Diagnosis Date    Acute febrile illness 10/20/2019   Allergy    Arthralgia 06/03/2014   Arthritis    Asthma    Bacteremia 10/21/2019   Cellulitis and abscess of right leg with Bacteremia 10/21/2019   Chest pain 05/31/2014   Chewing difficulty    Cholelithiases 10/26/2013   Claudication (HCC) 04/15/2016   Constipation    COPD (chronic obstructive pulmonary disease) (HCC) 10/21/2019   Coronary artery disease 2016   90% RCA and 30% LM S/P PCI of RCA   Depression    DOE (dyspnea on exertion)    Quit smoking 2002 - Spirometry 03/29/2019  FEV1 2.25 (55%)  Ratio 0.74 with no resp to saba p ? Prior    Onset June 2020 with cards w/u neg 02/2019 and assoc with atypical cp on prn ppi - 03/29/2019   Walked RA x two laps =  approx 52ft @ fast pace - stopped due to end of study with sats of 92% at the end of the study and mild sob  - 03/29/2019 max rx for GERD      Elevated troponin    Fatty liver    GERD (gastroesophageal reflux disease)    Headache    stress and tension   History of nuclear stress test    Myoview  1/19: EF 61, no ischemia, low risk   Hyperlipidemia    Hypertension    Joint pain    Melanoma (HCC) 11/24/2019   Right upper back. Superficial spreading. Breslow's 0.58mm. 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   Tubular adenoma of colon 03/2013   Past Surgical History:  Procedure Laterality Date   CHEST TUBE INSERTION Left 02/01/2014   Procedure: INSERTION PLEURAL  DRAINAGE CATHETER;  Surgeon: Norita Beauvais, MD;  Location: [facility] OR;  Service: Thoracic;  Laterality: Left;   COLONOSCOPY     COLONOSCOPY W/ BIOPSIES AND POLYPECTOMY  2014   benign   LEFT HEART CATHETERIZATION WITH CORONARY ANGIOGRAM N/A 06/07/2014   Procedure: LEFT HEART CATHETERIZATION WITH CORONARY ANGIOGRAM;  Surgeon: Peter M Swaziland, MD;  Location: [facility] CATH LAB;  Service: Cardiovascular;  Laterality: N/A;   LEFT HEART CATHETERIZATION WITH CORONARY ANGIOGRAM N/A 06/08/2014   Procedure: LEFT HEART CATHETERIZATION WITH CORONARY ANGIOGRAM;  Surgeon: Mickiel Albany, MD;  Location: [facility] CATH LAB;  Service: Cardiovascular;  Laterality: N/A;   MASS EXCISION N/A 10/25/2019   Procedure: SKIN BIOPSY;  Surgeon: Alanda Allegra, MD;  Location: [facility] ORS;  Service: General;  Laterality: N/A;   no prior surgery     PLEURAL BIOPSY Left 02/01/2014   Procedure: PLEURAL BIOPSY;  Surgeon: Norita Beauvais, MD;  Location: [facility] OR;  Service: Thoracic;  Laterality: Left;   PLEURAL EFFUSION DRAINAGE Left 02/01/2014   Procedure: DRAINAGE OF PLEURAL EFFUSION;  Surgeon: Norita Beauvais, MD;  Location: [facility] OR;  Service: Thoracic;  Laterality: Left;   REMOVAL OF PLEURAL DRAINAGE CATHETER Left 02/16/2014   Procedure: REMOVAL OF PLEURAL DRAINAGE CATHETER;  Surgeon: Norita Beauvais, MD;  Location: [facility] OR;  Service: Thoracic;  Laterality: Left;   RIGHT/LEFT HEART CATH AND CORONARY ANGIOGRAPHY N/A 05/16/2020   Procedure: RIGHT/LEFT HEART CATH AND CORONARY ANGIOGRAPHY;  Surgeon: Millicent Ally, MD;  Location: [facility] INVASIVE CV LAB;  Service: Cardiovascular;  Laterality: N/A;   TALC  PLEURODESIS Left 02/01/2014   Procedure: TALC  PLEURADESIS;  Surgeon: Norita Beauvais, MD;  Location: [facility] OR;  Service: Thoracic;  Laterality: Left;   THORACENTESIS Left 2015   VIDEO ASSISTED THORACOSCOPY Left 02/01/2014   Procedure: VIDEO ASSISTED THORACOSCOPY;  Surgeon: Norita Beauvais, MD;  Location: [facility] OR;  Service: Thoracic;  Laterality: Left;    VIDEO BRONCHOSCOPY N/A 02/01/2014   Procedure: VIDEO BRONCHOSCOPY;  Surgeon: Norita Beauvais, MD;  Location: [facility] OR;  Service: Thoracic;  Laterality: N/A;    Allergies  Allergies  Allergen Reactions   Hydroxychloroquine Dermatitis    Skin Rash    Home Medications    Prior to Admission medications   Medication Sig Start Date End Date Taking? Authorizing Provider  amLODipine  (NORVASC ) 2.5 MG tablet Take 1 tablet (2.5 mg total) by mouth daily. 02/27/23   Gerald Kitty., NP  atorvastatin  (LIPITOR) 20 MG tablet Take 1 tablet (20 mg total) by mouth daily. 02/27/23   Gerald Kitty., NP  clopidogrel  (PLAVIX ) 75 MG tablet Take 1 tablet (75 mg total) by mouth daily. 02/27/23   Gerald Kitty., NP  famotidine  (PEPCID ) 20 MG tablet One after supper 03/29/19   Diamond Formica, MD  Fluticasone-Umeclidin-Vilant (TRELEGY  ELLIPTA) 100-62.5-25 MCG/INH AEPB Inhale 100 fluid ounces into the lungs daily at 12 noon. 06/12/20   [provider]  HYDROcodone -acetaminophen  (NORCO) 5-325 MG tablet Take 1 tablet by mouth every 4 (four) hours as needed for moderate pain. 01/04/23   Sandi Crosby, PA-C  losartan  (COZAAR ) 100 MG tablet Take 1 tablet (100 mg total) by mouth daily. 02/27/23   Gerald Kitty., NP  metoprolol  tartrate (LOPRESSOR ) 25 MG tablet Take 0.5 tablets (12.5 mg total) by mouth 2 (two) times daily. 02/27/23   Gerald Kitty., NP  Multiple Vitamins-Minerals (CENTRUM ADULTS PO) Take 1 tablet by mouth daily.    [provider]  mycophenolate (CELLCEPT) 500 MG tablet Take 500 mg by mouth 2 (two) times daily.    [provider]  Omega-3 Fatty Acids (FISH OIL ) 1000 MG CPDR Take 4,000 mg by mouth daily. 02/10/15   Jacqueline Matsu, MD  ondansetron  (ZOFRAN ) 4 MG tablet Take 1 tablet (4 mg total) by mouth every 6 (six) hours. 05/22/23   Denese Finn, PA-C  pantoprazole  (PROTONIX ) 40 MG tablet Take 1 tablet (40 mg total) by mouth daily. 02/27/23   Gerald Kitty., NP  testosterone  cypionate (DEPOTESTOSTERONE CYPIONATE) 200 MG/ML injection Inject 200 mg into the muscle every 30 (thirty) days. 10/24/20   [provider]  Vitamin D , Ergocalciferol , (DRISDOL ) 1.25 MG (50000 UNIT) CAPS capsule Take 1 capsule (50,000 Units total) by mouth every 7 (seven) days. 09/27/20   Glenora Laos, MD    Physical Exam    Vital Signs:  Dodie Frees does not have vital signs available for review today.  Given telephonic nature of communication, physical exam is limited. AAOx3. NAD. Normal affect.  Speech and respirations are unlabored.  Accessory Clinical Findings    None  Assessment & Plan    1.  Preoperative Cardiovascular Risk Assessment:Procedure:  LEFT SECOND METATARSAL WEIL OSTEOTOMY WITH FLEXOR TENDON TRANSFER LAPIDUS BUNIONECTOMY WITH AKIN OSTEOTOMY VERUS FIRST METATARSAL PHALANGEAL JOINT FUSION   Date of Surgery:  Clearance 10/02/23                                  Surgeon:  DR Larey Plenty Surgeon's Group or Practice Name:  [facility] Phone number:  [phone] Fax number:  [phone]    Primary Cardiologist: Colin Keas, MD  Chart reviewed as part of pre-operative protocol coverage. Given past medical history and time since last visit, based on ACC/AHA guidelines, SHAN VALDES would be at acceptable risk for the planned procedure without further cardiovascular testing.   His RCRI is low risk, 0.9% risk of major cardiac event.  He is able to complete greater than 4 METS of physical activity.  Patient was advised that if he develops new symptoms prior to surgery to contact our office to arrange a follow-up appointment.  He verbalized understanding.  OK to hold plavix  for 5-7 days prior to surgery. Should be treated with ASA 81 mg daily while plavix  is held   I will route this recommendation to the requesting party via Epic fax function and remove from pre-op pool.     Time:   Today, I have spent 5  minutes with the patient with telehealth technology discussing medical history, symptoms, and management plan.  I spent 10 minutes reviewing patient's past cardiac history and cardiac medications.    Carie Charity, NP  09/24/2023, 1:25  PM

## 2023-09-26 ENCOUNTER — Ambulatory Visit: Attending: Cardiology

## 2023-09-26 DIAGNOSIS — Z01810 Encounter for preprocedural cardiovascular examination: Secondary | ICD-10-CM

## 2023-10-08 ENCOUNTER — Other Ambulatory Visit: Payer: Self-pay | Admitting: Podiatry

## 2023-10-14 ENCOUNTER — Encounter: Payer: Self-pay | Admitting: Podiatry

## 2023-10-20 ENCOUNTER — Other Ambulatory Visit: Payer: Self-pay | Admitting: Nurse Practitioner

## 2023-10-21 ENCOUNTER — Encounter: Payer: Self-pay | Admitting: Podiatry

## 2023-10-21 ENCOUNTER — Other Ambulatory Visit: Payer: Self-pay | Admitting: Podiatry

## 2023-10-21 NOTE — Discharge Instructions (Signed)
 [facility]  POST OPERATIVE INSTRUCTIONS FOR DR. ASHLEY AND DR. BAKER [facility] PODIATRY DEPARTMENT   Take your medication as prescribed.  Pain medication should be taken only as needed.  Keep the dressing clean, dry and intact.  Keep your foot elevated above the heart level for the first 48 hours.  Walking to the bathroom and brief periods of walking are acceptable, unless we have instructed you to be non-weight bearing.  Always wear your post-op shoe when walking.  Always use your crutches if you are to be non-weight bearing.  Do not take a shower. Baths are permissible as long as the foot is kept out of the water .   Every hour you are awake:  Bend your knee 15 times. Flex foot 15 times Massage calf 15 times  Call [facility] [phone]) if any of the following problems occur: You develop a temperature or fever. The bandage becomes saturated with blood. Medication does not stop your pain. Injury of the foot occurs. Any symptoms of infection including redness, odor, or red streaks running from wound.

## 2023-10-21 NOTE — Anesthesia Preprocedure Evaluation (Signed)
 Anesthesia Evaluation  Patient identified by MRN, date of birth, ID band Patient awake    Reviewed: Allergy & Precautions, H&P , NPO status , Patient's Chart, lab work & pertinent test results  Airway       Comment: Note hx rheumatoid arthritis, use caution with neck, could have atlanto-axial instability Dental   Pulmonary neg pulmonary ROS, former smoker 06-02-23 IMPRESSION: 1. 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 Am JINNY [employer] Crit Care Med Vol 198, Iss 5, [phone], Dec 07 2016. 2. Mild to moderate emphysema with mild diffuse bronchial wall thickening, suggesting COPD. 3. Three-vessel coronary atherosclerosis. 4. Cholelithiasis. 5. Colonic diverticulosis. 6. Aortic Atherosclerosis (ICD10-I70.0) and Emphysema (ICD10-J43.9).     CT Chest (05/2020)  1. Spectrum of findings compatible with basilar predominant fibrotic interstitial lung disease without frank honeycombing, mildly progressive in the interval with clear progression from more remote chest CT of 12/27/2013 high-resolution chest CT study. Findings are categorized as probable UIP per consensus guidelines: Diagnosis of Idiopathic Pulmonary Fibrosis: An Official ATS/ERS/JRS/ALAT Clinical  Practice Guideline. Am JINNY [employer] Crit Care Med Vol 198, Iss 5, [phone], Dec 07 2016.  2. Chronic mild smooth bilateral pleural thickening with thick calcified parenchymal band at the dependent left lung base. Favor the sequela of remote nonspecific  pleural insult (patient had left greater than right pleural effusions on remote 2015 chest CT study).  3. Three-vessel coronary atherosclerosis.  4. Mild diffuse hepatic steatosis.  5. Cholelithiasis.  6. Aortic Atherosclerosis (ICD10-I70.0) and Emphysema (ICD10-J43.9).           Cardiovascular hypertension, negative cardio ROS   1. Left ventricular ejection fraction, by estimation, is 60 to 65%. The  left ventricle has normal function. The left ventricle has no regional  wall motion abnormalities. There is mild left ventricular hypertrophy.  Left ventricular diastolic parameters  are consistent with Grade I diastolic dysfunction (impaired relaxation).   2. Right ventricular systolic function is normal. The right ventricular  size is normal.   3. Left atrial size was mild to moderately dilated.   4. The mitral valve is normal in structure. No evidence of mitral valve  regurgitation. No evidence of mitral stenosis.   5. The aortic valve is tricuspid. There is mild calcification of the  aortic valve. There is mild thickening of the aortic valve. Aortic valve  regurgitation is not visualized. No aortic stenosis is present.     Neuro/Psych negative neurological ROS  negative psych ROS   GI/Hepatic negative GI ROS, Neg liver ROS,,,  Endo/Other  negative endocrine ROS    Renal/GU negative Renal ROS  negative genitourinary   Musculoskeletal negative musculoskeletal ROS (+)    Abdominal   Peds negative pediatric ROS (+)  Hematology negative hematology ROS (+)   Anesthesia Other Findings GERD (gastroesophageal reflux disease)  Hypertension Shortness of breath  Headache Neuromuscular disorder (HCC)  Arthritis Asthma  Coronary artery disease Tubular adenoma of colon  History of nuclear stress test Allergy Hyperlipidemia Rheumatoid arthritis (HCC)  COPD (chronic obstructive pulmonary disease) (HCC) Cholelithiases Rash and nonspecific skin eruption TEN (toxic  epidermal necrolysis) (HCC)  Acute febrile illness Arthralgia  Bacteremia Cellulitis and abscess of right leg with Bacteremia  Chest  pain Claudication (HCC)  DOE (dyspnea on exertion) Elevated troponin  Morbid obesity due to excess calories (HCC) Normocytic anemia  Restless leg syndrome Stented coronary artery  Melanoma (HCC) Shortness of breath on exertion  Joint pain Depression  Chewing difficulty Swallowing difficulty  Constipation Fatty liver  Other fatigue Rheumatoid arthritis (HCC)  Melanoma (HCC) Mild hepatic steatosis    Reproductive/Obstetrics negative OB ROS                              Anesthesia Physical Anesthesia Plan  ASA: 3  Anesthesia Plan: General   Post-op Pain Management:    Induction:   PONV Risk Score and Plan:   Airway Management Planned:   Additional Equipment:   Intra-op Plan:   Post-operative Plan:   Informed Consent:   Plan Discussed with:   Anesthesia Plan Comments:          Anesthesia Quick Evaluation

## 2023-10-23 ENCOUNTER — Encounter: Payer: Self-pay | Admitting: Podiatry

## 2023-10-23 ENCOUNTER — Encounter
Admission: RE | Disposition: A | Discharge status: Home / Self Care | Payer: Self-pay | Source: Home / Self Care | Attending: Podiatry

## 2023-10-23 ENCOUNTER — Ambulatory Visit: Payer: Self-pay | Admitting: Anesthesiology

## 2023-10-23 ENCOUNTER — Ambulatory Visit
Admission: RE | Admit: 2023-10-23 | Discharge: 2023-10-23 | Disposition: A | Discharge status: Home / Self Care | Attending: Podiatry | Admitting: Podiatry

## 2023-10-23 ENCOUNTER — Ambulatory Visit: Payer: Self-pay

## 2023-10-23 ENCOUNTER — Other Ambulatory Visit: Payer: Self-pay

## 2023-10-23 DIAGNOSIS — M79672 Pain in left foot: Secondary | ICD-10-CM | POA: Insufficient documentation

## 2023-10-23 DIAGNOSIS — M2042 Other hammer toe(s) (acquired), left foot: Secondary | ICD-10-CM | POA: Insufficient documentation

## 2023-10-23 DIAGNOSIS — I700 Atherosclerosis of aorta: Secondary | ICD-10-CM | POA: Insufficient documentation

## 2023-10-23 DIAGNOSIS — M069 Rheumatoid arthritis, unspecified: Secondary | ICD-10-CM | POA: Insufficient documentation

## 2023-10-23 DIAGNOSIS — M19072 Primary osteoarthritis, left ankle and foot: Secondary | ICD-10-CM | POA: Insufficient documentation

## 2023-10-23 DIAGNOSIS — Z87891 Personal history of nicotine dependence: Secondary | ICD-10-CM | POA: Insufficient documentation

## 2023-10-23 DIAGNOSIS — M216X2 Other acquired deformities of left foot: Secondary | ICD-10-CM | POA: Diagnosis present

## 2023-10-23 DIAGNOSIS — M205X1 Other deformities of toe(s) (acquired), right foot: Secondary | ICD-10-CM | POA: Insufficient documentation

## 2023-10-23 DIAGNOSIS — I10 Essential (primary) hypertension: Secondary | ICD-10-CM | POA: Insufficient documentation

## 2023-10-23 DIAGNOSIS — M205X2 Other deformities of toe(s) (acquired), left foot: Secondary | ICD-10-CM | POA: Insufficient documentation

## 2023-10-23 DIAGNOSIS — M19071 Primary osteoarthritis, right ankle and foot: Secondary | ICD-10-CM | POA: Insufficient documentation

## 2023-10-23 DIAGNOSIS — J439 Emphysema, unspecified: Secondary | ICD-10-CM | POA: Diagnosis not present

## 2023-10-23 DIAGNOSIS — M2012 Hallux valgus (acquired), left foot: Secondary | ICD-10-CM | POA: Insufficient documentation

## 2023-10-23 DIAGNOSIS — M2011 Hallux valgus (acquired), right foot: Secondary | ICD-10-CM | POA: Insufficient documentation

## 2023-10-23 DIAGNOSIS — M79671 Pain in right foot: Secondary | ICD-10-CM | POA: Diagnosis not present

## 2023-10-23 HISTORY — DX: Atherosclerosis of aorta: I70.0

## 2023-10-23 HISTORY — DX: Fatty (change of) liver, not elsewhere classified: K76.0

## 2023-10-23 HISTORY — DX: Idiopathic pulmonary fibrosis: J84.112

## 2023-10-23 SURGERY — BUNIONECTOMY, LAPIDUS
Anesthesia: General | Site: Toe | Laterality: Left

## 2023-10-23 MED ORDER — OXYCODONE-ACETAMINOPHEN 7.5-325 MG PO TABS: 1.0000 | ORAL_TABLET | Freq: Four times a day (QID) | ORAL | 0 refills | Status: AC | PRN

## 2023-10-23 MED ORDER — ONDANSETRON HCL 4 MG/2ML IJ SOLN
INTRAMUSCULAR | Status: AC
  Filled 2023-10-23: qty 2

## 2023-10-23 MED ORDER — MIDAZOLAM HCL 2 MG/2ML IJ SOLN
INTRAMUSCULAR | Status: AC
  Filled 2023-10-23: qty 2

## 2023-10-23 MED ORDER — 0.9 % SODIUM CHLORIDE (POUR BTL) OPTIME
TOPICAL | Status: DC | PRN
  Administered 2023-10-23: 500 mL

## 2023-10-23 MED ORDER — LIDOCAINE HCL (PF) 2 % IJ SOLN
INTRAMUSCULAR | Status: AC
  Filled 2023-10-23: qty 5

## 2023-10-23 MED ORDER — OXYCODONE HCL 5 MG PO TABS
10.0000 mg | ORAL_TABLET | Freq: Once | ORAL | Status: AC
  Administered 2023-10-23: 10 mg via ORAL

## 2023-10-23 MED ORDER — FENTANYL CITRATE (PF) 100 MCG/2ML IJ SOLN
100.0000 ug | Freq: Once | INTRAMUSCULAR | Status: AC
  Administered 2023-10-23 (×2): 50 ug via INTRAVENOUS

## 2023-10-23 MED ORDER — LIDOCAINE HCL (CARDIAC) PF 100 MG/5ML IV SOSY
PREFILLED_SYRINGE | INTRAVENOUS | Status: DC | PRN
  Administered 2023-10-23: 100 mg via INTRAVENOUS

## 2023-10-23 MED ORDER — DEXAMETHASONE SODIUM PHOSPHATE 4 MG/ML IJ SOLN
INTRAMUSCULAR | Status: DC | PRN
  Administered 2023-10-23: 4 mg via INTRAVENOUS

## 2023-10-23 MED ORDER — OXYCODONE HCL 5 MG PO TABS
ORAL_TABLET | ORAL | Status: AC
  Filled 2023-10-23: qty 2

## 2023-10-23 MED ORDER — BUPIVACAINE HCL 0.25 % IJ SOLN
INTRAMUSCULAR | Status: AC
  Filled 2023-10-23: qty 1

## 2023-10-23 MED ORDER — METOPROLOL TARTRATE 5 MG/5ML IV SOLN
INTRAVENOUS | Status: DC | PRN
  Administered 2023-10-23 (×3): 1 mg via INTRAVENOUS

## 2023-10-23 MED ORDER — CEFAZOLIN SODIUM-DEXTROSE 2-3 GM-%(50ML) IV SOLR
INTRAVENOUS | Status: AC
  Filled 2023-10-23: qty 50

## 2023-10-23 MED ORDER — METOPROLOL TARTRATE 5 MG/5ML IV SOLN
INTRAVENOUS | Status: AC
  Filled 2023-10-23: qty 5

## 2023-10-23 MED ORDER — AMOXICILLIN-POT CLAVULANATE 875-125 MG PO TABS: 1.0000 | ORAL_TABLET | Freq: Two times a day (BID) | ORAL | 0 refills | Status: AC

## 2023-10-23 MED ORDER — MIDAZOLAM HCL 2 MG/2ML IJ SOLN
1.0000 mg | INTRAMUSCULAR | Status: DC | PRN
  Administered 2023-10-23 (×3): 1 mg via INTRAVENOUS

## 2023-10-23 MED ORDER — PROPOFOL 10 MG/ML IV BOLUS
INTRAVENOUS | Status: AC
  Filled 2023-10-23: qty 20

## 2023-10-23 MED ORDER — BUPIVACAINE HCL (PF) 0.25 % IJ SOLN
INTRAMUSCULAR | Status: DC | PRN
Start: 2023-10-23 — End: 2023-10-23
  Administered 2023-10-23 (×2): 10 mL via PERINEURAL

## 2023-10-23 MED ORDER — SEVOFLURANE IN SOLN
RESPIRATORY_TRACT | Status: AC
  Filled 2023-10-23: qty 250

## 2023-10-23 MED ORDER — ASPIRIN 81 MG PO TBEC: 81.0000 mg | DELAYED_RELEASE_TABLET | Freq: Two times a day (BID) | ORAL | 0 refills | Status: AC

## 2023-10-23 MED ORDER — HYDROMORPHONE HCL 1 MG/ML IJ SOLN
INTRAMUSCULAR | Status: AC
  Filled 2023-10-23: qty 0.5

## 2023-10-23 MED ORDER — LIDOCAINE HCL (PF) 2 % IJ SOLN
INTRAMUSCULAR | Status: AC
Start: 2023-10-23 — End: 2023-10-23
  Filled 2023-10-23: qty 2

## 2023-10-23 MED ORDER — ONDANSETRON HCL 4 MG PO TABS: 4.0000 mg | ORAL_TABLET | Freq: Three times a day (TID) | ORAL | 0 refills | Status: AC | PRN

## 2023-10-23 MED ORDER — PROPOFOL 10 MG/ML IV BOLUS
INTRAVENOUS | Status: DC | PRN
Start: 2023-10-23 — End: 2023-10-23
  Administered 2023-10-23: 200 mg via INTRAVENOUS

## 2023-10-23 MED ORDER — HYDROMORPHONE HCL 1 MG/ML IJ SOLN
0.5000 mg | INTRAMUSCULAR | Status: DC | PRN
  Administered 2023-10-23: 0.5 mg via INTRAVENOUS

## 2023-10-23 MED ORDER — DEXAMETHASONE SODIUM PHOSPHATE 4 MG/ML IJ SOLN
INTRAMUSCULAR | Status: AC
Start: 2023-10-23 — End: 2023-10-23
  Filled 2023-10-23: qty 1

## 2023-10-23 MED ORDER — LIDOCAINE HCL (PF) 2 % IJ SOLN
INTRAMUSCULAR | Status: DC | PRN
  Administered 2023-10-23 (×2): 1 mL via INTRADERMAL

## 2023-10-23 MED ORDER — BUPIVACAINE LIPOSOME 1.3 % IJ SUSP
INTRAMUSCULAR | Status: AC
  Filled 2023-10-23: qty 20

## 2023-10-23 MED ORDER — ONDANSETRON HCL 4 MG/2ML IJ SOLN
INTRAMUSCULAR | Status: DC | PRN
  Administered 2023-10-23: 4 mg via INTRAVENOUS

## 2023-10-23 MED ORDER — DEXAMETHASONE SODIUM PHOSPHATE 10 MG/ML IJ SOLN
INTRAMUSCULAR | Status: AC
  Filled 2023-10-23: qty 1

## 2023-10-23 MED ORDER — CEFAZOLIN SODIUM-DEXTROSE 2-4 GM/100ML-% IV SOLN
2.0000 g | INTRAVENOUS | Status: AC
  Administered 2023-10-23: 2 g via INTRAVENOUS

## 2023-10-23 MED ORDER — LACTATED RINGERS IV SOLN: INTRAVENOUS | Status: DC

## 2023-10-23 MED ORDER — FENTANYL CITRATE (PF) 100 MCG/2ML IJ SOLN
INTRAMUSCULAR | Status: AC
  Filled 2023-10-23: qty 2

## 2023-10-23 MED ORDER — BUPIVACAINE LIPOSOME 1.3 % IJ SUSP
INTRAMUSCULAR | Status: DC | PRN
Start: 2023-10-23 — End: 2023-10-23
  Administered 2023-10-23 (×2): 10 mL via PERINEURAL

## 2023-10-23 MED ORDER — FENTANYL CITRATE (PF) 100 MCG/2ML IJ SOLN
INTRAMUSCULAR | Status: AC
Start: 2023-10-23 — End: 2023-10-23
  Filled 2023-10-23: qty 2

## 2023-10-23 SURGICAL SUPPLY — 38 items
ANCH TENOTAC 2 LNG FEM 2.2X6 (Anchor) IMPLANT
ANCH TENOTAC 2 STD MALE 2.2X8 (Anchor) IMPLANT
BENZOIN TINCTURE PRP APPL 2/3 (GAUZE/BANDAGES/DRESSINGS) IMPLANT
BLADE OSC/SAGITTAL MD 5.5X18 (BLADE) IMPLANT
BLADE SAW LAPIPLASTY 40X11 (BLADE) IMPLANT
BLADE SURG 15 STRL LF DISP TIS (BLADE) IMPLANT
BLADE SW THK.38XMED LNG THN (BLADE) IMPLANT
BNDG ELASTIC 4X5.8 VLCR NS LF (GAUZE/BANDAGES/DRESSINGS) ×4 IMPLANT
BNDG ESMARCH 4X12 STRL LF (GAUZE/BANDAGES/DRESSINGS) ×4 IMPLANT
BNDG GAUZE DERMACEA FLUFF 4 (GAUZE/BANDAGES/DRESSINGS) ×4 IMPLANT
BNDG STRETCH 4X75 STRL LF (GAUZE/BANDAGES/DRESSINGS) IMPLANT
CANISTER SUCT 1200ML W/VALVE (MISCELLANEOUS) ×4 IMPLANT
COVER LIGHT HANDLE UNIVERSAL (MISCELLANEOUS) ×8 IMPLANT
DRAPE FLUOR MINI C-ARM 54X84 (DRAPES) ×4 IMPLANT
DURAPREP 26ML APPLICATOR (WOUND CARE) ×4 IMPLANT
ELECTRODE REM PT RTRN 9FT ADLT (ELECTROSURGICAL) ×4 IMPLANT
GAUZE SPONGE 4X4 12PLY STRL (GAUZE/BANDAGES/DRESSINGS) ×4 IMPLANT
GAUZE XEROFORM 1X8 LF (GAUZE/BANDAGES/DRESSINGS) ×4 IMPLANT
GLOVE BIOGEL PI IND STRL 7.5 (GLOVE) ×4 IMPLANT
GLOVE SURG SS PI 7.0 STRL IVOR (GLOVE) ×4 IMPLANT
GOWN STRL REUS W/ TWL LRG LVL3 (GOWN DISPOSABLE) ×8 IMPLANT
KIT TURNOVER KIT A (KITS) ×4 IMPLANT
KWIRE DBL END TROCAR 6X.062 (WIRE) IMPLANT
KWIRE SINGLE TT SMTH 1.1X100 (WIRE) IMPLANT
NS IRRIG 500ML POUR BTL (IV SOLUTION) ×4 IMPLANT
PACK EXTREMITY ARMC (MISCELLANEOUS) ×4 IMPLANT
PADDING CAST BLEND 4X4 NS (MISCELLANEOUS) ×12 IMPLANT
SCREW 2.7 HIGH PITCH LOCKING (Screw) IMPLANT
SCREW SHORT THRD MONSTR 2.0X11 (Screw) IMPLANT
SCREW SHORT THRD SNAPOF 2.0X12 (Screw) IMPLANT
SPLINT CAST 1 STEP 4X30 (MISCELLANEOUS) ×4 IMPLANT
STOCKINETTE IMPERVIOUS LG (DRAPES) ×4 IMPLANT
STRIP CLOSURE SKIN 1/4X4 (GAUZE/BANDAGES/DRESSINGS) IMPLANT
SUT VIC AB 3-0 SH 27X BRD (SUTURE) IMPLANT
SUTURE EHLN 3-0 FS-10 30 BLK (SUTURE) IMPLANT
SUTURE MNCRL 4-0 27XMF (SUTURE) IMPLANT
SYSTEM LAPIPLASTY 4A (Orthopedic Implant) IMPLANT
TENOTAC 2.0 SOFT TISSUE FIXATION SYSTEM IMPLANT

## 2023-10-23 NOTE — Op Note (Signed)
 PODIATRY / FOOT AND ANKLE SURGERY OPERATIVE REPORT    SURGEON: Prentice Lee, DPM  PRE-OPERATIVE DIAGNOSIS:  1.  Hallux valgus left 2.  Left long plantarflexed second metatarsal 3.  Left second digit hammertoe contracture  POST-OPERATIVE DIAGNOSIS: Same  PROCEDURE(S): Left Lapidus bunionectomy Left second metatarsal Weil osteotomy Left second toe flexor tendon transfer  HEMOSTASIS: Left ankle tourniquet  ANESTHESIA: general  ESTIMATED BLOOD LOSS: 40 cc  FINDING(S): 1.  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 male who presents with pain underneath the second metatarsal phalangeal joint of the left foot with associated mild contracture of toe.  On x-ray imaging patient also appears to have a fairly substantial bunion deformity with elevation of the first metatarsal and long plantarflexed second tarsal.  Patient has been treated conservatively but has failed to improve.  All treatment options were discussed with the patient both conservative and surgical attempts at correction include potential risks and complications at this time patient is elected for surgical procedure today.  No guarantees given.  Consider obtained prior to procedure..  DESCRIPTION: After obtaining full informed written consent, the patient was brought back to the operating room and placed supine upon the operating table.  The patient received IV antibiotics prior to induction.  After obtaining adequate anesthesia, the patient was prepped and draped in the standard fashion.  Preoperatively popliteal/saphenous nerve block form by anesthesia.  Initial bite bandages used to exsanguinate the left lower extremity the pneumatic ankle tourniquet was inflated.  Attention was then directed to the first metatarsal phalange joint where a linear longitudinal incision was made over this area following the contour of the bony  deformity medial to the tendon of the extensor hallucis longus.  The incision was deepened through the subcutaneous tissues utilizing sharp blunt dissection care was taken to notify retract all vital neurovascular structures no venous contributories were cauterized necessary.  At this time a capsular incision was made medial to the tendon of the EHL and involved the contour of the deformity.  The capsular and periosteal tissue was reflected medially and laterally at the operative site thereby expose the first metatarsal phalange joint at the operative site.  There appeared to be some erosive cartilage damage to the dorsal aspect of the joint but the remainder the joint appeared to be healthy and intact.  Small dorsal exostosis also present with enlarged medial eminence.  The medial eminence and dorsal exostosis at the joint were resected and passed off from the operative site.  At this time attention was directed to the first interspace via the same incision where the extensor tendon was retracted medially and the skin subcutaneous tissue was retracted laterally.  At this time a lateral capsulotomy was performed releasing the collateral and suspensory ligaments to this area as well as the conjoined tendon of the adductor hallucis.  Attention was then directed to the first tarsometatarsal joint where incision made slightly medial to the tendon of the EHL.  The incision was deepened through the subcutaneous tissues utilizing sharp and blunt dissection and care was taken to notify and retract all vital neurovascular structures no venous contributories were cauterized as necessary.  At this time a capsular periosteal incision was made into the first tarsometatarsal joint area and the first tarsometatarsal joint was opened up as the tissues were reflected medially and laterally at the operative site.  An osteotome was then placed into the joint to  free up any soft tissue adhesions as well as ligamentous attachments.   The sagittal bone saw was then placed into the joint as well to resect any bony prominence present to the area.  At this time the derotational wire for the Lapa plasty set was then placed through the first metatarsal base with the appropriate orientation.  The fulcrum was then in place and then the reduction clamp was applied after a small incision was made over the dorsal lateral aspect of the second metatarsal midshaft with blunt dissection continued down to bone.  The reduction clamp was applied while the rotating the first metatarsal head with the appropriate orientation.  The reduction clamp was then held intact and wire fixation was then obtained across the area holding the correction.  C-arm imaging was utilized to verify correct position which appeared to be excellent.  At this time the joint seeker was then applied to the first tarsometatarsal joint followed by the joint cut guide which was then fixated into place after inspection under fluoroscopic guidance.  This appeared to sit in appropriate position.  At this time wire was then placed to fixate the cut guide.  The joint seeker was then removed.  A sagittal bone saw was then used to create the osteotomies to remove the first tarsometatarsal joint cartilage.  This was performed.  The cut guide in one of the crossing pins was removed leaving the 2 other pins intact.  The distractor was then applied after the reduction clamp with wire was removed.  Once this was distracted the joint surfaces were cleaned out removing the osteotomy cuts.  There did not appear to be any further cartilage remaining to the joint.  The surgical site was flushed with copious amounts normal sterile saline.  Fenestration was then performed at the joint surfaces.  At this time the reduction compression device was then applied to compress the area.  This appeared well.  C-arm imaging was utilized to verify correction which appeared to be excellent with excellent apposition at the  first tarsometatarsal joint.  The first intermetatarsal space angle appeared to be well reduced as well as reduction of the hallux abductus angle and sesamoids appeared to be rotated under the first metatarsal.  Temporary fixation is obtained with a crossing threaded olive wire.  The medial plate from [employer] was then applied and held into place with temporary olive wire fixation and checked out once again under fluoroscopic guidance and appeared to sit in the appropriate position.  At this time the 4 corresponding locking screws then applied to the area with standard AO principles and techniques.  The compression guide and wires were removed and passed off the operative side at this time leaving the compression wire intact.  At this time the 4-hole dorsal plate was then applied and 4 corresponding locking screws were then applied to the area utilizing standard AO principles and techniques.  The temporary fixation wire was removed.  Fixation appeared to be excellent overall.  Stressing is then performed between the 1st and 2nd metatarsals to see if there is any gapping in the cuneiform area and did not appear to have any present.  The surgical site was flushed with copious amounts normal sterile saline.  C-arm imaging was utilized once again verify correction and correct position of plate and screws which appeared to be excellent.  The capsular and periosteal tissues at both areas were then reapproximated well coapted with 3-0 Vicryl.  The first metatarsal phalangeal joint had a  capsulotomy performed of the area removing a V-type wedge present to the area and was also reapproximated with 3-0 Vicryl to tighten the medial capsular structures.  This appeared to correct now slight pectus angle yet further to relatively rectus position.  The subcutaneous tissues reapproximated WITH 3-0 Vicryl and the skin was then reapproximated coapted with 4-0 running Monocryl.  Attention was then directed to the second  metatarsal phalangeal joint area where a linear incision was made over the dorsal aspect of the joint.  The incision was deepened through the subcutaneous tissues utilizing sharp and blunt dissection care was taken to identify and retract all vital neurovascular structures all venous contributories were cauterized as necessary.  At this time a capsulotomy was performed medial to the tendon of the extensor digitorum longus.  The capsule and periosteal tissue was reflected medially and laterally thereby exposing the second tarsometatarsal joint at the operative site.  At this time the second metatarsal head was able to be exposed.  A sagittal bone saw was then used to to create a Weil type osteotomy.  A V-type osteotomy was actually created to remove a small wedge of bone from the area to not only shorten it but elevate the area.  The metatarsal capital fragment was shifted proximally 4 mm.  This was then held intact a temporary fixation.  C-arm imaging was utilized to verify correct position which appeared be excellent.  2 Paragon 28 snap off 2 oh screws then placed utilizing standard AO principles techniques.  Excellent compression was noted.  The dorsal overhang was then resected and passed off the operative site.  The toe appeared to sit in a fairly rectus position but now appeared to be slightly elevated due to loosening of the plantar structures.  At this time it was determined to perform a flexor tendon transfer.  Attention was then directed to the plantar aspect of the second metatarsal phalangeal joint area.  A wire was then placed from dorsal plantar up across the proximal phalanx base with the appropriate orientation and was checked under fluoroscopic guidance for appropriate placement.  Appeared to be excellent.  At this time an incision was made at the plantar aspect segment of tarsal phalange joint area at the proximal phalanx base.  Dissection was deepened through the subcutaneous tissues utilizing  sharp blunt dissection care was taken to notify and retract all vital neurovascular structures all venous contributories were cauterized necessary.  At this time an incision was then made into the flexor tendon sheath.  The flexor digitorum brevis and longus tendons were identified.  The guidewire appeared to be going straight through the middle of these tendons that were divided around the area.  It appeared to be in good position so at this time the drill was then used to drill from plantar to dorsal and dorsal plantar creating a bone tunnel for the Tenotac anchor.  A 0.062 K wire was then drilled through the distal phalanx and middle phalanx across the PIPJ while holding the toe in a slightly dorsiflexed and rectus position.  At this time the Tenotac anchor was then applied through the plantar hole capturing as much flexor tendon as possible and also pulling the flexor tendons distally distal to the Tino text site to tighten the structures at the metatarsal phalangeal joint level to plantarflex the second toe.  Once this was then placed the dorsal screw component was then screwed into the plantar component tightening the flexor tendon to the proximal phalanx base completing the  transfer.  The K wire from the toe was then removed.  The toe appeared to sit in rectus position and did not appear to be floating further.  There appeared to be reduction in the plantar prominence at the segment of tarsal phalangeal joint as well.  C-arm imaging was utilized to verify correct position and final imaging was taken..  Show reduction in the bunion deformity with good fixation across tarsometatarsal joint.  Also appeared to have shortening of the sec metatarsal to normal parabola at this point.  Fixation appear to be intact to the flexor tendon area for the flexor tendon transfer appropriately.  The surgical site was flushed with copious amounts normal sterile saline.  At this time the pneumatic ankle tourniquet was deflated  and a prompt hyper response was noted all digits of the left foot.  The incisions dorsally and plantarly at the second tarsal flange joint level reapproximated well coapted with 3-0 Vicryl at the level of the capsular and periosteal tissue.  The subcutaneous tissue was reapproximated With 3-0 Vicryl and the skin was then reapproximated coapted with 4-0 Monocryl dorsally and 3-0 nylon plantarly.  A postoperative dressing was applied consisting of Xeroform followed by 4 x 4 gauze, Kling, ABD pad, Kerlix, soft roll, posterior splint, Ace wrap.  The patient tolerated the procedure and anesthesia well and was transferred to the recovery room with vital sign stable vascular status intact all toes of the left foot.  Following.  Postoperative monitoring the patient be discharged home with the appropriate orders and instructions.  Patient is to follow-up in outpatient clinic in 1 week for further evaluation.  COMPLICATIONS: None  CONDITION: Good, stable  Prentice Lee, DPM

## 2023-10-23 NOTE — Anesthesia Procedure Notes (Signed)
 Anesthesia Regional Block: Popliteal block   Pre-Anesthetic Checklist: , timeout performed,  Correct Patient, Correct Site, Correct Laterality,  Correct Procedure, Correct Position, site marked,  Risks and benefits discussed,  Surgical consent,  Pre-op evaluation,  At surgeon's request and post-op pain management  Laterality: Lower and Left  Prep: chloraprep       Needles:  Injection technique: Single-shot  Needle Type: Echogenic Needle     Needle Length: 9cm  Needle Gauge: 21     Additional Needles:   Procedures:,,,, ultrasound used (permanent image in chart),,    Narrative:  Start time: 10/23/2023 8:22 AM End time: 10/23/2023 8:28 AM Injection made incrementally with aspirations every 5 mL.  Performed by: Personally  Anesthesiologist: Ola Donny BROCKS, MD  Additional Notes: Patient consented for risk and benefits of nerve block including but not limited to nerve damage, failed block, bleeding and infection.  Patient voiced understanding.  Functioning IV was confirmed and monitors were applied.  Timeout done prior to procedure and prior to any sedation being given to the patient.  Patient confirmed procedure site prior to any sedation given to the patient.  A 50mm 22ga Stimuplex needle was used. Sterile prep,hand hygiene and sterile gloves were used.  Minimal sedation used for procedure.  No paresthesia reported by patient during the procedure.  Negative aspiration and negative test dose prior to incremental administration of local anesthetic. The patient tolerated the procedure well with no immediate complications.

## 2023-10-23 NOTE — Transfer of Care (Signed)
 Immediate Anesthesia Transfer of Care Note  Patient: Colin Lee  Procedure(s) Performed: ROMAYNE LOOK (Left: Foot) FUSION, JOINT, GREAT TOE (Left: Toe) OSTEOTOMY, WEIL (Left) CORRECTION, TOE, CROSSOVER (Left: Second Toe)  Patient Location: PACU  Anesthesia Type: General  Level of Consciousness: awake, alert  and patient cooperative  Airway and Oxygen Therapy: Patient Spontanous Breathing and Patient connected to supplemental oxygen  Post-op Assessment: Post-op Vital signs reviewed, Patient's Cardiovascular Status Stable, Respiratory Function Stable, Patent Airway and No signs of Nausea or vomiting  Post-op Vital Signs: Reviewed and stable  Complications: No notable events documented.

## 2023-10-23 NOTE — Anesthesia Procedure Notes (Signed)
 Anesthesia Regional Block: Adductor canal block   Pre-Anesthetic Checklist: , timeout performed,  Correct Patient, Correct Site, Correct Laterality,  Correct Procedure, Correct Position, site marked,  Risks and benefits discussed,  Surgical consent,  Pre-op evaluation,  At surgeon's request and post-op pain management  Laterality: Lower and Left  Prep: chloraprep       Needles:  Injection technique: Single-shot  Needle Type: Echogenic Needle     Needle Length: 9cm  Needle Gauge: 21     Additional Needles:   Procedures:,,,, ultrasound used (permanent image in chart),,    Narrative:  Start time: 10/23/2023 8:16 AM End time: 10/23/2023 8:21 AM Injection made incrementally with aspirations every 5 mL.  Performed by: Personally  Anesthesiologist: Ola Donny BROCKS, MD  Additional Notes: Patient consented for risk and benefits of nerve block including but not limited to nerve damage, failed block, bleeding and infection.  Patient voiced understanding.  Functioning IV was confirmed and monitors were applied.  Timeout done prior to procedure and prior to any sedation being given to the patient.  Patient confirmed procedure site prior to any sedation given to the patient.  A 50mm 22ga Stimuplex needle was used. Sterile prep,hand hygiene and sterile gloves were used.  Minimal sedation used for procedure.  No paresthesia reported by patient during the procedure.  Negative aspiration and negative test dose prior to incremental administration of local anesthetic. The patient tolerated the procedure well with no immediate complications.

## 2023-10-23 NOTE — Anesthesia Postprocedure Evaluation (Signed)
 Anesthesia Post Note  Patient: Colin Lee  Procedure(s) Performed: ROMAYNE LOOK (Left: Foot) FUSION, JOINT, GREAT TOE (Left: Toe) OSTEOTOMY, WEIL (Left) CORRECTION, TOE, CROSSOVER (Left: Second Toe)  Patient location during evaluation: PACU Anesthesia Type: General Level of consciousness: awake and alert Pain management: pain level controlled Vital Signs Assessment: post-procedure vital signs reviewed and stable Respiratory status: spontaneous breathing, nonlabored ventilation, respiratory function stable and patient connected to nasal cannula oxygen Cardiovascular status: blood pressure returned to baseline and stable Postop Assessment: no apparent nausea or vomiting Anesthetic complications: no   No notable events documented.   Last Vitals:  Vitals:   10/23/23 1223 10/23/23 1230  BP:  131/74  Pulse: 70 70  Resp: 12 11  Temp:    SpO2: 94% 95%    Last Pain:  Vitals:   10/23/23 1223  TempSrc:   PainSc: 8                  Aleesia Henney C Mustaf Antonacci

## 2023-10-23 NOTE — Anesthesia Procedure Notes (Signed)
 Procedure Name: LMA Insertion Date/Time: 10/23/2023 9:03 AM  Performed by: Ibn Stief, CRNAPre-anesthesia Checklist: Patient identified, Emergency Drugs available, Suction available, Patient being monitored and Timeout performed Patient Re-evaluated:Patient Re-evaluated prior to induction Oxygen Delivery Method: Circle system utilized Preoxygenation: Pre-oxygenation with 100% oxygen Induction Type: IV induction LMA: LMA inserted LMA Size: 5.0 Number of attempts: 1 Placement Confirmation: positive ETCO2, CO2 detector and breath sounds checked- equal and bilateral Tube secured with: Tape Dental Injury: Teeth and Oropharynx as per pre-operative assessment

## 2023-10-23 NOTE — H&P (Addendum)
 Updated H&P  Date of Service: 10/23/23  CC:  Chief Complaint  Patient presents with  Office Visit  Discuss Surgery   History of Present Illness Patient presents today for presurgical visit for left foot second metatarsalgia with hammertoe contracture and bunion pain.  Patient presents today for surgical procedure.  Patient states that most of his pain is near the second metatarsal phalange joint but does have issues with shoes fitting due to bunion issues as well as it rubs against shoe and cause discomfort.  Patient currently denies nausea, vomiting, fevers, chills.   PMH: Past Medical History:  Diagnosis Date  Angina pectoris ()  Elevated troponin I level  GERD (gastroesophageal reflux disease)  Hypertension  Normocytic anemia  Pleural effusion exudative  Pulmonary infiltrate  Stented coronary artery   Medication: Current Outpatient Medications on File Prior to Visit  Medication Sig Dispense Refill  acetaminophen  (TYLENOL  8 HOUR ORAL) Take by mouth as needed  amLODIPine  (NORVASC ) 5 MG tablet Take 5 mg by mouth once daily  atorvastatin  (LIPITOR) 40 MG tablet Take 40 mg by mouth once daily  azelastine (ASTELIN) 137 mcg nasal spray INHALE ONE PUFFS IN EACH NOSTRIL TWICE DAILY  betamethasone dipropionate (DIPROSONE) 0.05 % cream Apply topically 2 (two) times daily 15 g 0  clobetasoL (TEMOVATE) 0.05 % ointment apply to the affected area(s) twice daily  clopidogreL  (PLAVIX ) 75 mg tablet Take 75 mg by mouth once daily  docosahexaenoic acid/epa (FISH OIL  ORAL) Take 4 capsules by mouth once daily  ergocalciferol , vitamin D2, 1,250 mcg (50,000 unit) capsule Take 50,000 mcg by mouth every 7 (seven) days  famotidine  (PEPCID ) 40 MG tablet 1 tablet Orally Once a day for 30 day(s)  ferrous sulfate 325 (65 FE) MG tablet 1 tablet Orally Three times a Week for 30 days  gabapentin (NEURONTIN) 300 MG capsule Take 1 capsule (300 mg total) by mouth 3 (three) times daily 90 capsule 11  inFLIXimab   (REMICADE ) 100 mg injection Inject 3 mg/kg into the vein every 8 (eight) weeks Loading doses to be given at weeks 0, 2, and 4 prior to maintenance frequency.  losartan  (COZAAR ) 100 MG tablet once daily  metoprolol  tartrate (LOPRESSOR ) 25 MG tablet Take 12.5 mg by mouth 2 (two) times daily  multivitamin tablet Take 1 tablet by mouth once daily  mycophenolate (CELLCEPT) 500 mg tablet Take 2 tablets (1,000 mg total) by mouth every 12 (twelve) hours 360 tablet 1  naproxen (NAPROSYN) 500 MG tablet Take 1 tablet (500 mg total) by mouth 2 (two) times daily as needed 60 tablet 5  nitroGLYcerin  (NITROSTAT ) 0.4 MG SL tablet Place 0.4 mg under the tongue every 5 (five) minutes as needed for Chest pain May take up to 3 doses.  pantoprazole  (PROTONIX ) 40 MG DR tablet Take 40 mg by mouth once daily  sildenafil (REVATIO) 20 mg tablet Take 20 mg by mouth as needed  testosterone  cypionate (DEPO-TESTOSTERONE ) 200 mg/mL injection Inject 200 mg into the muscle every 28 (twenty-eight) days  TRELEGY ELLIPTA 100-62.5-25 mcg inhaler INHALE 1 PUFF INTO THE LUNGS ONCE A DAY. 60 each 0  urea (CEROVEL) 40 % topical cream Apply topically 2 (two) times daily To affected thicked/discolored nails 90 g 2  WEGOVY  0.25 mg/0.5 mL pen injector Inject 0.25 mg subcutaneously once a week   No current facility-administered medications on file prior to visit.   Allergies: Allergies as of 08/29/2023 - Reviewed 08/29/2023  Allergen Reaction Noted  Plaquenil [hydroxychloroquine] Dermatitis 11/18/2019   Surgical History: Past Surgical  History:  Procedure Laterality Date  back surgery to remove melanoma spot 2021   Social History: Social History   Socioeconomic History  Marital status: Married  Occupational History  Occupation: [employer]  Tobacco Use  Smoking status: Former  Current packs/day: 0.00  Average packs/day: 2.0 packs/day for 30.0 years (60.0 ttl pk-yrs)  Types: Cigarettes  Start date: 44  Quit  date: 2001  Years since quitting: 24.4  Passive exposure: Current (since 08/2021)  Smokeless tobacco: Never  Vaping Use  Vaping status: Never Used  Substance and Sexual Activity  Alcohol use: Defer  Drug use: Defer  Sexual activity: Defer   Social Drivers of Health   Financial Resource Strain: Low Risk (05/12/2023)  Overall Financial Resource Strain (CARDIA)  Difficulty of Paying Living Expenses: Not hard at all  Food Insecurity: No Food Insecurity (05/12/2023)  Hunger Vital Sign  Worried About Running Out of Food in the Last Year: Never true  Ran Out of Food in the Last Year: Never true  Transportation Needs: No Transportation Needs (05/12/2023)  PRAPARE - Risk analyst (Medical): No  Lack of Transportation (Non-Medical): No  Housing Stability: Low Risk (05/12/2023)  Housing Stability Vital Sign  Unable to Pay for Housing in the Last Year: No  Number of Times Moved in the Last Year: 0  Homeless in the Last Year: No   Social History   Tobacco Use  Smoking Status Former  Current packs/day: 0.00  Average packs/day: 2.0 packs/day for 30.0 years (60.0 ttl pk-yrs)  Types: Cigarettes  Start date: 70  Quit date: 2001  Years since quitting: 24.4  Passive exposure: Current (since 08/2021)  Smokeless Tobacco Never   Past Family History: Family History  Adopted: Yes  Family history unknown: Yes    Review of Systems:  A comprehensive 14 point ROS was performed, reviewed, and the pertinent orthopaedic findings are documented in the HPI.  Objective: General/Constitutional: No apparent distress: well-nourished and well developed.  CV: RRR, no obvious murmur  Resp: CTA bil, nonlabored  Psych: Normal mood and affect, oriented to person, place and time (AOx3)  Vascular: DP/PT pulses intact bil, CFT intact to digits foot bil, hair growth to digits foot noted bil.  Neuro: Light touch sensation reduced to digits bil. Semmes Weinstein monofilament testing  5/10 left, 4/10 right. Tinel sign negative bilateral lower extremity nerve distributions.  Derm: No open lesions or ulcerations noted BLE. Left hallux and second toenails appear to be thickened, discolored, dystrophic and brittle subungual debris.  MSK: 5/5 strength to BLE MSK groups. Some slight limitation of range of motion to the first metatarsal phalange joints bilaterally but does not produce pain at end range of motion. Hallux valgus contracture present bilaterally but patient has minimal to no pain on palpation to bunion deformities. Minimal pain on palpation to both mid feet over the tarsometatarsal joints greater than left. POP to the left 2nd MTPJ and with ROM. Left 2nd toe appears dorsally elevated at the 2nd MTPJ, flexible contracture.  X-ray left foot 3 views AP, lateral, lateral oblique: No acute fracture or dislocation present. Moderate hallux valgus contracture with increased first intermetatarsal space angle, approximately 18-19 degrees with increased hallux abductus angle. Mild arthritic changes present at the first metatarsal phalangeal joint with increased medial eminence prominence. Elevation of the left second toe with second metatarsal phalange joint. This appears to be increased compared to previous imaging. Small posterior and plantar calcaneal heel spurs. Slightly elevated first ray.  Right foot  3 views AP, lateral, lateral oblique: No acute fracture or dislocation present. Arthritic changes present at the first metatarsal phalangeal joint and tarsometatarsal joints 2 through 5. Hallux valgus contracture with increased first intermetatarsal space angle to approximately 18 to 19 degrees with increased hallux abductus and hallux interphalangeus angles. Narrowing present in the first metatarsal phalangeal joint.  Assessment: Encounter Diagnoses  Name Primary?  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 Slight dislocation causing pain and swelling. - X-ray imaging reviewed and discussed with patient in detail. - Discussed treatment options - Discussed RICE therapy with usage of NSAIDs/tylenol  as needed. Could usage Voltaren or Biofreeze as needed topical OTC. Patient to consider usage of Budin splint, toe spacers and separators, taping the second metatarsal phalange joint/toe in a plantarflexed position. Could consider steroid injection/oral steroid medication -Discussed surgical intervention consisting of left Lapidus bunionectomy with Akin osteotomy versus first metatarsal phalangeal joint fusion and second metatarsal Weil osteotomy with flexor tendon transfer. Discussed postoperative course in detail consisting of at minimum 2 weeks nonweightbearing followed by weightbearing in boot for around 4 to 6 weeks with progressive weightbearing. Patient would likely transition back to normal supportive shoe somewhere between 6 to 8 weeks after procedure depending on swelling and pain as well as radiographic findings. It is likely the patient will not get back to full activity until at least 3 months after surgery but could be longer depending on swelling and individualize postoperative course. -Patient has PCP and cardiac clearance. -Discussed all treatment options of both conservative and surgical attempts at correction. After the benefits and complications of surgery were discussed, pt has elected for surgery consisting of left second metatarsal Weil osteotomy with flexor tendon transfer, 2nd hammer toe repair, Lapidus bunionectomy  with Akin osteotomy versus first metatarsal phalange joint fusion. Discussed postop course in great detail with postop expectations and course of recovery. Pt understands and is agreeable. Consent obtained. No guarantees given. -Discussed neuropathic pain, will address this further in outpatient clinic.   Return 1 week after surgery.   Prentice Ozell Lee, DPM

## 2023-10-24 ENCOUNTER — Encounter: Payer: Self-pay | Admitting: Podiatry

## 2023-10-27 ENCOUNTER — Encounter: Payer: Self-pay | Admitting: Podiatry

## 2023-12-15 ENCOUNTER — Other Ambulatory Visit: Payer: Self-pay | Admitting: Nurse Practitioner

## 2023-12-16 ENCOUNTER — Other Ambulatory Visit: Payer: Self-pay

## 2023-12-31 ENCOUNTER — Other Ambulatory Visit: Payer: Self-pay | Admitting: Nurse Practitioner

## 2024-01-16 ENCOUNTER — Other Ambulatory Visit: Payer: Self-pay | Admitting: Cardiology

## 2024-01-16 ENCOUNTER — Other Ambulatory Visit: Payer: Self-pay | Admitting: Nurse Practitioner

## 2024-03-20 ENCOUNTER — Other Ambulatory Visit: Payer: Self-pay | Admitting: Cardiology

## 2024-04-06 ENCOUNTER — Other Ambulatory Visit: Payer: Self-pay | Admitting: Cardiology

## 2024-04-09 ENCOUNTER — Telehealth: Payer: Self-pay | Admitting: Cardiology

## 2024-04-09 NOTE — Telephone Encounter (Signed)
 Pt would like to switch providers from Dr. Shlomo to Dr. Kriste. Please advise.

## 2024-04-09 NOTE — Telephone Encounter (Signed)
" °*  STAT* If patient is at the pharmacy, call can be transferred to refill team.   1. Which medications need to be refilled? (please list name of each medication and dose if known)   clopidogrel  (PLAVIX ) 75 MG tablet  amLODipine  (NORVASC ) 2.5 MG tablet   metoprolol  tartrate (LOPRESSOR ) 25 MG tablet    2. Would you like to learn more about the convenience, safety, & potential cost savings by using the [facility]? No    3. Are you open to using the [facility] ([facility]. No    4. Which pharmacy/location (including street and city if local pharmacy) is medication to be sent to? [facility] - [place], [place] - [address]     5. Do they need a 30 day or 90 day supply? 90 day  Pt scheduled 05/24/24.  "

## 2024-04-13 NOTE — Telephone Encounter (Signed)
 Placed call to pt, gave him my direct # to call me back.  I can get him into see Dr. Kriste, since he is switching from Dr. Shlomo, sooner then February.

## 2024-04-14 ENCOUNTER — Other Ambulatory Visit: Payer: Self-pay | Admitting: Cardiology

## 2024-04-14 MED ORDER — METOPROLOL TARTRATE 25 MG PO TABS: 12.5000 mg | ORAL_TABLET | Freq: Two times a day (BID) | ORAL | 0 refills | Status: AC

## 2024-04-14 MED ORDER — AMLODIPINE BESYLATE 2.5 MG PO TABS: 2.5000 mg | ORAL_TABLET | Freq: Every day | ORAL | 0 refills | Status: AC

## 2024-04-14 MED ORDER — CLOPIDOGREL BISULFATE 75 MG PO TABS: 75.0000 mg | ORAL_TABLET | Freq: Every day | ORAL | 0 refills | Status: AC

## 2024-04-14 NOTE — Telephone Encounter (Signed)
 Refills sent

## 2024-04-21 ENCOUNTER — Telehealth: Payer: Self-pay | Admitting: Cardiology

## 2024-04-21 NOTE — Telephone Encounter (Signed)
" °*  STAT* If patient is at the pharmacy, call can be transferred to refill team.   1. Which medications need to be refilled? (please list name of each medication and dose if known)   amLODipine  (NORVASC ) 2.5 MG tablet     2. Would you like to learn more about the convenience, safety, & potential cost savings by using the [facility]? NO    3. Are you open to using the [facility] ([facility]. No    4. Which pharmacy/location (including street and city if local pharmacy) is medication to be sent to? [facility] - [place], [place] - [address]     5. Do they need a 30 day or 90 day supply? 90 day   Pt has appt scheduled 1/30  "

## 2024-04-27 ENCOUNTER — Other Ambulatory Visit: Payer: Self-pay | Admitting: Cardiology

## 2024-04-27 ENCOUNTER — Other Ambulatory Visit: Payer: Self-pay | Admitting: Nurse Practitioner

## 2024-04-28 NOTE — Telephone Encounter (Signed)
 Was sent 04/14/24.

## 2024-05-04 ENCOUNTER — Other Ambulatory Visit: Payer: Self-pay | Admitting: Cardiology

## 2024-05-07 ENCOUNTER — Ambulatory Visit: Attending: Internal Medicine | Admitting: Internal Medicine

## 2024-05-07 VITALS — BP 162/68 | HR 73 | Ht 69.0 in | Wt 290.0 lb

## 2024-05-07 DIAGNOSIS — I25118 Atherosclerotic heart disease of native coronary artery with other forms of angina pectoris: Secondary | ICD-10-CM | POA: Diagnosis not present

## 2024-05-07 DIAGNOSIS — J449 Chronic obstructive pulmonary disease, unspecified: Secondary | ICD-10-CM | POA: Diagnosis not present

## 2024-05-07 DIAGNOSIS — E782 Mixed hyperlipidemia: Secondary | ICD-10-CM | POA: Diagnosis not present

## 2024-05-07 DIAGNOSIS — I10 Essential (primary) hypertension: Secondary | ICD-10-CM | POA: Diagnosis not present

## 2024-05-07 DIAGNOSIS — R079 Chest pain, unspecified: Secondary | ICD-10-CM | POA: Diagnosis not present

## 2024-05-07 MED ORDER — HYDROCHLOROTHIAZIDE 25 MG PO TABS: 25.0000 mg | ORAL_TABLET | Freq: Every day | ORAL | 3 refills | Status: AC

## 2024-05-07 NOTE — Progress Notes (Signed)
 " Cardiology Office Note:  .   Date:  05/07/2024  ID:  Colin Lee, DOB 1960/10/27, MRN [id number] PCP: [facility]  [facility] Providers Cardiologist:  Wilbert Bihari, MD    History of Present Illness: .     Discussed the use of AI scribe software for clinical note transcription with the patient, who gave verbal consent to proceed.  History of Present Illness Colin Lee is a 64 year old male with hypertension, coronary artery disease, and hyperlipidemia who presents for an annual follow-up.  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 - Actively attempting weight loss  Nocturnal leg cramps - Severe leg cramps primarily at night, waking him from sleep - No leg cramps during walking - History of low magnesium  levels  Antihypertensive and cardiovascular medication use - Currently taking Plavix  75 mg, atorvastatin  20 mg, losartan  100 mg, Lopressor  12.5 mg twice daily, and amlodipine  2.5 mg - Previously on aspirin , now discontinued          ROS: Remaining review of systems negative  Studies Reviewed: SABRA   EKG Interpretation Date/Time:  Friday May 07 2024 16:02:55 EST Ventricular Rate:  73 PR Interval:  170 QRS Duration:  98 QT Interval:  388 QTC Calculation: 427 R Axis:   -1  Text Interpretation: Normal sinus rhythm Normal ECG When  compared with ECG of 04-Jan-2023 16:28, No significant change was found Confirmed by Kriste Hicks [phone]) on 05/07/2024 4:07:41 PM    Results Diagnostic Right and left heart catheterization (05/16/2020): Patent RCA stent without progression of disease; mildly elevated right heart pressures Echocardiogram (05/15/2020): Left ventricular ejection fraction 65%; mild left ventricular hypertrophy; greater diastolic dysfunction Risk Assessment/Calculations:            Physical Exam:   VS:  BP (!) 162/68   Pulse 73   Ht 5' 9 (1.753 m)   Wt 290 lb (131.5 kg)   SpO2 96%   BMI 42.83 kg/m    Wt Readings from Last 3 Encounters:  05/07/24 290 lb (131.5 kg)  10/23/23 262 lb (118.8 kg)  05/22/23 227 lb 1.2 oz (103 kg)    GEN: Well nourished, well developed in no acute distress NECK: No JVD; No carotid bruits CARDIAC:  RRR with occasional extrasystoles, no murmurs, no rubs, no gallops RESPIRATORY:  Clear to auscultation without rales, wheezing or rhonchi  ABDOMEN: Soft, non-tender, non-distended EXTREMITIES: Trace bilateral lower extremity edema; No deformity   ASSESSMENT AND PLAN: .    Assessment and Plan Assessment & Plan Coronary artery disease, status post drug-eluting stents to right coronary artery with stable angina Intermittent exertional chest pain, resolving with rest. Recent catheterization showed patent RCA stent, no disease progression. Echocardiogram: 65% ejection fraction, mild LV hypertrophy, diastolic dysfunction.  - Ordered PET CT to assess for coronary artery blockage. - Continue Plavix  75 mg daily. - Continue amlodipine  2.5 mg -  Will consider adding Imdur  Hypertension Blood pressure elevated, possibly due to weight gain. Amlodipine  2.5 mg currently used. Discussed potential for amlodipine  to cause leg swelling. Aim to manage hypertension to reduce cardiovascular risk. - Start HCTZ 25 mg to manage blood pressure and reduce fluid retention. - Advised to take  hydrochlorothiazide  in the morning to avoid nocturia. - Will check BMP and magnesium  in 2 weeks  Hyperlipidemia Atorvastatin  20 mg daily.  Leg cramps Severe nocturnal leg cramps, possibly due to electrolyte imbalances. Previous magnesium  deficiency noted. - Check magnesium  levels with primary care physician. - Start magnesium  supplement if magnesium  levels are low.  Obesity Recently gained significant amount of weight and is working on weight loss       Informed Consent   Shared Decision Making/Informed Consent The risks [chest pain, shortness of breath, cardiac arrhythmias, dizziness, blood pressure fluctuations, myocardial infarction, stroke/transient ischemic attack, nausea, vomiting, allergic reaction, radiation exposure, metallic taste sensation and life-threatening complications (estimated to be 1 in 10,000)], benefits (risk stratification, diagnosing coronary artery disease, treatment guidance) and alternatives of a cardiac PET stress test were discussed in detail with Colin Lee and he agrees to proceed.       Follow up: 6 months or sooner  Signed, Emeline FORBES Calender, DO  05/07/2024 4:35 PM    [facility] "

## 2024-05-07 NOTE — Patient Instructions (Signed)
 Medication Instructions:  START  TAKING :  HYDROCHLOROTHIAZIDE   25 MG ONCE  DAY    *If you need a refill on your cardiac medications before your next appointment, please call your pharmacy*   Lab Work:   PLEASE GO DOWN STAIRS  [facility]  FIRST FLOOR   ( GET OFF ELEVATORS WALK TOWARDS WAITING AREA LAB LOCATED BY PHARMACY):  RETURN   BMET  MAG AND BNP  IN  2 WEEKS     If you have labs (blood work) drawn today and your tests are completely normal, you will receive your results only by: MyChart Message (if you have MyChart) OR A paper copy in the mail If you have any lab test that is abnormal or we need to change your treatment, we will call you to review the results.  Testing/Procedures:  you have been recommended to get  A cardiac PET scan is a medical imaging procedure that uses radioactive tracers to create detailed images of the heart and its blood flow. It helps healthcare professionals diagnose and monitor various heart conditions.     Follow-Up: At [facility], you and your health needs are our priority.  As part of our continuing mission to provide you with exceptional heart care, our providers are all part of one team.  This team includes your primary Cardiologist (physician) and Advanced Practice Providers or APPs (Physician Assistants and Nurse Practitioners) who all work together to provide you with the care you need, when you need it.  Your next appointment:  4 -6  month(s)   Provider:  Dr. Kriste    We recommend signing up for the patient portal called MyChart.  Sign up information is provided on this After Visit Summary.  MyChart is used to connect with patients for Virtual Visits (Telemedicine).  Patients are able to view lab/test results, encounter notes, upcoming appointments, etc.  Non-urgent messages can be sent to your provider as well.   To learn more about what you can do with MyChart, go to [email or web address].   Other Instructions

## 2024-05-24 ENCOUNTER — Ambulatory Visit: Admitting: Emergency Medicine

## 2024-05-27 ENCOUNTER — Ambulatory Visit
